# Patient Record
Sex: Male | Born: 1940
Health system: Southern US, Community
[De-identification: ages and names within clinical notes are randomized; demographics above are authoritative.]

## PROBLEM LIST (undated history)

## (undated) DIAGNOSIS — N401 Enlarged prostate with lower urinary tract symptoms: Secondary | ICD-10-CM

## (undated) DIAGNOSIS — I69398 Other sequelae of cerebral infarction: Secondary | ICD-10-CM

## (undated) DIAGNOSIS — I6523 Occlusion and stenosis of bilateral carotid arteries: Secondary | ICD-10-CM

## (undated) DIAGNOSIS — I6930 Unspecified sequelae of cerebral infarction: Secondary | ICD-10-CM

## (undated) DIAGNOSIS — Z973 Presence of spectacles and contact lenses: Secondary | ICD-10-CM

## (undated) DIAGNOSIS — G20A1 Parkinson's disease without dyskinesia, without mention of fluctuations: Secondary | ICD-10-CM

## (undated) DIAGNOSIS — Z87820 Personal history of traumatic brain injury: Secondary | ICD-10-CM

## (undated) DIAGNOSIS — Q211 Atrial septal defect: Secondary | ICD-10-CM

## (undated) DIAGNOSIS — Z95818 Presence of other cardiac implants and grafts: Secondary | ICD-10-CM

## (undated) DIAGNOSIS — N529 Male erectile dysfunction, unspecified: Secondary | ICD-10-CM

## (undated) DIAGNOSIS — E785 Hyperlipidemia, unspecified: Secondary | ICD-10-CM

## (undated) DIAGNOSIS — R7303 Prediabetes: Secondary | ICD-10-CM

## (undated) DIAGNOSIS — G20 Parkinson's disease: Secondary | ICD-10-CM

## (undated) DIAGNOSIS — Z7901 Long term (current) use of anticoagulants: Secondary | ICD-10-CM

## (undated) DIAGNOSIS — Q2112 Patent foramen ovale: Secondary | ICD-10-CM

## (undated) DIAGNOSIS — R269 Unspecified abnormalities of gait and mobility: Secondary | ICD-10-CM

## (undated) DIAGNOSIS — D494 Neoplasm of unspecified behavior of bladder: Secondary | ICD-10-CM

## (undated) DIAGNOSIS — I10 Essential (primary) hypertension: Secondary | ICD-10-CM

## (undated) DIAGNOSIS — H409 Unspecified glaucoma: Secondary | ICD-10-CM

## (undated) DIAGNOSIS — Z8711 Personal history of peptic ulcer disease: Secondary | ICD-10-CM

## (undated) DIAGNOSIS — F482 Pseudobulbar affect: Secondary | ICD-10-CM

## (undated) DIAGNOSIS — R479 Unspecified speech disturbances: Secondary | ICD-10-CM

## (undated) HISTORY — PX: TRANSURETHRAL RESECTION OF PROSTATE: SHX73

## (undated) HISTORY — DX: Essential (primary) hypertension: I10

## (undated) HISTORY — PX: CATARACT EXTRACTION W/ INTRAOCULAR LENS IMPLANT: SHX1309

## (undated) HISTORY — DX: Parkinson's disease: G20

## (undated) HISTORY — PX: OTHER SURGICAL HISTORY: SHX169

## (undated) HISTORY — DX: Parkinson's disease without dyskinesia, without mention of fluctuations: G20.A1

---

## 1976-11-27 HISTORY — PX: APPENDECTOMY: SHX54

## 1978-01-25 HISTORY — PX: REPAIR OF PERFORATED ULCER: SHX6065

## 2015-04-09 HISTORY — PX: TRANSTHORACIC ECHOCARDIOGRAM: SHX275

## 2015-08-30 DIAGNOSIS — I10 Essential (primary) hypertension: Secondary | ICD-10-CM | POA: Diagnosis not present

## 2015-08-30 DIAGNOSIS — E782 Mixed hyperlipidemia: Secondary | ICD-10-CM | POA: Diagnosis not present

## 2015-09-06 DIAGNOSIS — E782 Mixed hyperlipidemia: Secondary | ICD-10-CM | POA: Diagnosis not present

## 2015-09-06 DIAGNOSIS — N401 Enlarged prostate with lower urinary tract symptoms: Secondary | ICD-10-CM | POA: Diagnosis not present

## 2015-09-06 DIAGNOSIS — N183 Chronic kidney disease, stage 3 (moderate): Secondary | ICD-10-CM | POA: Diagnosis not present

## 2015-09-06 DIAGNOSIS — I10 Essential (primary) hypertension: Secondary | ICD-10-CM | POA: Diagnosis not present

## 2015-09-06 DIAGNOSIS — I6930 Unspecified sequelae of cerebral infarction: Secondary | ICD-10-CM | POA: Diagnosis not present

## 2015-09-09 DIAGNOSIS — K635 Polyp of colon: Secondary | ICD-10-CM | POA: Diagnosis not present

## 2015-10-14 DIAGNOSIS — D126 Benign neoplasm of colon, unspecified: Secondary | ICD-10-CM | POA: Diagnosis not present

## 2015-10-20 DIAGNOSIS — Z024 Encounter for examination for driving license: Secondary | ICD-10-CM | POA: Diagnosis not present

## 2015-10-27 DIAGNOSIS — N400 Enlarged prostate without lower urinary tract symptoms: Secondary | ICD-10-CM | POA: Diagnosis not present

## 2015-10-27 DIAGNOSIS — Z86010 Personal history of colonic polyps: Secondary | ICD-10-CM | POA: Diagnosis not present

## 2015-10-27 DIAGNOSIS — Z8673 Personal history of transient ischemic attack (TIA), and cerebral infarction without residual deficits: Secondary | ICD-10-CM | POA: Diagnosis not present

## 2015-10-27 DIAGNOSIS — Z79899 Other long term (current) drug therapy: Secondary | ICD-10-CM | POA: Diagnosis not present

## 2015-10-27 DIAGNOSIS — G20 Parkinson's disease: Secondary | ICD-10-CM | POA: Diagnosis not present

## 2015-10-27 DIAGNOSIS — Z883 Allergy status to other anti-infective agents status: Secondary | ICD-10-CM | POA: Diagnosis not present

## 2015-10-27 DIAGNOSIS — Z7902 Long term (current) use of antithrombotics/antiplatelets: Secondary | ICD-10-CM | POA: Diagnosis not present

## 2015-10-27 DIAGNOSIS — E785 Hyperlipidemia, unspecified: Secondary | ICD-10-CM | POA: Diagnosis not present

## 2015-10-27 DIAGNOSIS — D126 Benign neoplasm of colon, unspecified: Secondary | ICD-10-CM | POA: Diagnosis not present

## 2015-10-27 DIAGNOSIS — I10 Essential (primary) hypertension: Secondary | ICD-10-CM | POA: Diagnosis not present

## 2015-12-28 DIAGNOSIS — G20 Parkinson's disease: Secondary | ICD-10-CM | POA: Diagnosis not present

## 2015-12-28 DIAGNOSIS — I639 Cerebral infarction, unspecified: Secondary | ICD-10-CM | POA: Diagnosis not present

## 2016-01-29 DIAGNOSIS — N390 Urinary tract infection, site not specified: Secondary | ICD-10-CM | POA: Diagnosis not present

## 2016-01-29 DIAGNOSIS — M25571 Pain in right ankle and joints of right foot: Secondary | ICD-10-CM | POA: Diagnosis not present

## 2016-01-29 DIAGNOSIS — Z7902 Long term (current) use of antithrombotics/antiplatelets: Secondary | ICD-10-CM | POA: Diagnosis not present

## 2016-01-29 DIAGNOSIS — Z8673 Personal history of transient ischemic attack (TIA), and cerebral infarction without residual deficits: Secondary | ICD-10-CM | POA: Diagnosis not present

## 2016-01-29 DIAGNOSIS — I10 Essential (primary) hypertension: Secondary | ICD-10-CM | POA: Diagnosis not present

## 2016-01-29 DIAGNOSIS — M25572 Pain in left ankle and joints of left foot: Secondary | ICD-10-CM | POA: Diagnosis not present

## 2016-01-29 DIAGNOSIS — G20 Parkinson's disease: Secondary | ICD-10-CM | POA: Diagnosis not present

## 2016-01-29 DIAGNOSIS — M7989 Other specified soft tissue disorders: Secondary | ICD-10-CM | POA: Diagnosis not present

## 2016-01-29 DIAGNOSIS — X58XXXA Exposure to other specified factors, initial encounter: Secondary | ICD-10-CM | POA: Diagnosis not present

## 2016-01-29 DIAGNOSIS — S93401A Sprain of unspecified ligament of right ankle, initial encounter: Secondary | ICD-10-CM | POA: Diagnosis not present

## 2016-01-29 DIAGNOSIS — R609 Edema, unspecified: Secondary | ICD-10-CM | POA: Diagnosis not present

## 2016-01-29 DIAGNOSIS — R51 Headache: Secondary | ICD-10-CM | POA: Diagnosis not present

## 2016-01-29 DIAGNOSIS — Z79899 Other long term (current) drug therapy: Secondary | ICD-10-CM | POA: Diagnosis not present

## 2016-02-01 DIAGNOSIS — R600 Localized edema: Secondary | ICD-10-CM | POA: Diagnosis not present

## 2016-02-01 DIAGNOSIS — K2960 Other gastritis without bleeding: Secondary | ICD-10-CM | POA: Diagnosis not present

## 2016-02-01 DIAGNOSIS — I10 Essential (primary) hypertension: Secondary | ICD-10-CM | POA: Diagnosis not present

## 2016-02-01 DIAGNOSIS — N183 Chronic kidney disease, stage 3 (moderate): Secondary | ICD-10-CM | POA: Diagnosis not present

## 2016-02-08 DIAGNOSIS — L500 Allergic urticaria: Secondary | ICD-10-CM | POA: Diagnosis not present

## 2016-02-08 DIAGNOSIS — Z719 Counseling, unspecified: Secondary | ICD-10-CM | POA: Diagnosis not present

## 2016-02-08 DIAGNOSIS — Z882 Allergy status to sulfonamides status: Secondary | ICD-10-CM | POA: Diagnosis not present

## 2016-02-11 DIAGNOSIS — Z79899 Other long term (current) drug therapy: Secondary | ICD-10-CM | POA: Diagnosis not present

## 2016-02-11 DIAGNOSIS — Z8673 Personal history of transient ischemic attack (TIA), and cerebral infarction without residual deficits: Secondary | ICD-10-CM | POA: Diagnosis not present

## 2016-02-11 DIAGNOSIS — Z7902 Long term (current) use of antithrombotics/antiplatelets: Secondary | ICD-10-CM | POA: Diagnosis not present

## 2016-02-11 DIAGNOSIS — L509 Urticaria, unspecified: Secondary | ICD-10-CM | POA: Diagnosis not present

## 2016-02-11 DIAGNOSIS — I10 Essential (primary) hypertension: Secondary | ICD-10-CM | POA: Diagnosis not present

## 2016-02-11 DIAGNOSIS — T7840XA Allergy, unspecified, initial encounter: Secondary | ICD-10-CM | POA: Diagnosis not present

## 2016-02-11 DIAGNOSIS — G20 Parkinson's disease: Secondary | ICD-10-CM | POA: Diagnosis not present

## 2016-02-15 DIAGNOSIS — G20 Parkinson's disease: Secondary | ICD-10-CM | POA: Diagnosis not present

## 2016-02-15 DIAGNOSIS — L508 Other urticaria: Secondary | ICD-10-CM | POA: Diagnosis not present

## 2016-02-15 DIAGNOSIS — I10 Essential (primary) hypertension: Secondary | ICD-10-CM | POA: Diagnosis not present

## 2016-02-15 DIAGNOSIS — R600 Localized edema: Secondary | ICD-10-CM | POA: Diagnosis not present

## 2016-03-07 DIAGNOSIS — N183 Chronic kidney disease, stage 3 (moderate): Secondary | ICD-10-CM | POA: Diagnosis not present

## 2016-03-07 DIAGNOSIS — I10 Essential (primary) hypertension: Secondary | ICD-10-CM | POA: Diagnosis not present

## 2016-03-07 DIAGNOSIS — E782 Mixed hyperlipidemia: Secondary | ICD-10-CM | POA: Diagnosis not present

## 2016-03-14 DIAGNOSIS — I10 Essential (primary) hypertension: Secondary | ICD-10-CM | POA: Diagnosis not present

## 2016-03-14 DIAGNOSIS — E782 Mixed hyperlipidemia: Secondary | ICD-10-CM | POA: Diagnosis not present

## 2016-03-14 DIAGNOSIS — R69 Illness, unspecified: Secondary | ICD-10-CM | POA: Diagnosis not present

## 2016-03-14 DIAGNOSIS — N401 Enlarged prostate with lower urinary tract symptoms: Secondary | ICD-10-CM | POA: Diagnosis not present

## 2016-03-14 DIAGNOSIS — R7301 Impaired fasting glucose: Secondary | ICD-10-CM | POA: Diagnosis not present

## 2016-03-14 DIAGNOSIS — I6930 Unspecified sequelae of cerebral infarction: Secondary | ICD-10-CM | POA: Diagnosis not present

## 2016-03-14 DIAGNOSIS — N183 Chronic kidney disease, stage 3 (moderate): Secondary | ICD-10-CM | POA: Diagnosis not present

## 2016-03-30 DIAGNOSIS — M7021 Olecranon bursitis, right elbow: Secondary | ICD-10-CM | POA: Diagnosis not present

## 2016-05-15 DIAGNOSIS — I10 Essential (primary) hypertension: Secondary | ICD-10-CM | POA: Diagnosis not present

## 2016-05-15 DIAGNOSIS — R7301 Impaired fasting glucose: Secondary | ICD-10-CM | POA: Diagnosis not present

## 2016-05-15 DIAGNOSIS — M7021 Olecranon bursitis, right elbow: Secondary | ICD-10-CM | POA: Diagnosis not present

## 2016-05-15 DIAGNOSIS — R600 Localized edema: Secondary | ICD-10-CM | POA: Diagnosis not present

## 2016-05-22 DIAGNOSIS — I10 Essential (primary) hypertension: Secondary | ICD-10-CM | POA: Diagnosis not present

## 2016-05-22 DIAGNOSIS — R7301 Impaired fasting glucose: Secondary | ICD-10-CM | POA: Diagnosis not present

## 2016-05-22 DIAGNOSIS — R600 Localized edema: Secondary | ICD-10-CM | POA: Diagnosis not present

## 2016-05-22 DIAGNOSIS — M7021 Olecranon bursitis, right elbow: Secondary | ICD-10-CM | POA: Diagnosis not present

## 2016-05-22 DIAGNOSIS — S20211A Contusion of right front wall of thorax, initial encounter: Secondary | ICD-10-CM | POA: Diagnosis not present

## 2016-05-23 DIAGNOSIS — R609 Edema, unspecified: Secondary | ICD-10-CM | POA: Diagnosis not present

## 2016-05-23 DIAGNOSIS — I517 Cardiomegaly: Secondary | ICD-10-CM | POA: Diagnosis not present

## 2016-05-23 DIAGNOSIS — I519 Heart disease, unspecified: Secondary | ICD-10-CM | POA: Diagnosis not present

## 2016-05-24 DIAGNOSIS — Z881 Allergy status to other antibiotic agents status: Secondary | ICD-10-CM | POA: Diagnosis not present

## 2016-05-24 DIAGNOSIS — N390 Urinary tract infection, site not specified: Secondary | ICD-10-CM | POA: Diagnosis not present

## 2016-05-24 DIAGNOSIS — T675XXA Heat exhaustion, unspecified, initial encounter: Secondary | ICD-10-CM | POA: Diagnosis not present

## 2016-05-24 DIAGNOSIS — G20 Parkinson's disease: Secondary | ICD-10-CM | POA: Diagnosis not present

## 2016-05-24 DIAGNOSIS — Z880 Allergy status to penicillin: Secondary | ICD-10-CM | POA: Diagnosis not present

## 2016-05-24 DIAGNOSIS — Z79899 Other long term (current) drug therapy: Secondary | ICD-10-CM | POA: Diagnosis not present

## 2016-05-24 DIAGNOSIS — I10 Essential (primary) hypertension: Secondary | ICD-10-CM | POA: Diagnosis not present

## 2016-05-24 DIAGNOSIS — Z8673 Personal history of transient ischemic attack (TIA), and cerebral infarction without residual deficits: Secondary | ICD-10-CM | POA: Diagnosis not present

## 2016-07-03 ENCOUNTER — Ambulatory Visit (INDEPENDENT_AMBULATORY_CARE_PROVIDER_SITE_OTHER): Payer: Medicare HMO | Admitting: Neurology

## 2016-07-03 ENCOUNTER — Encounter: Payer: Self-pay | Admitting: Neurology

## 2016-07-03 VITALS — BP 110/68 | HR 67 | Ht 67.0 in | Wt 154.0 lb

## 2016-07-03 DIAGNOSIS — F482 Pseudobulbar affect: Secondary | ICD-10-CM | POA: Diagnosis not present

## 2016-07-03 DIAGNOSIS — R482 Apraxia: Secondary | ICD-10-CM

## 2016-07-03 DIAGNOSIS — K117 Disturbances of salivary secretion: Secondary | ICD-10-CM | POA: Diagnosis not present

## 2016-07-03 DIAGNOSIS — R69 Illness, unspecified: Secondary | ICD-10-CM | POA: Diagnosis not present

## 2016-07-03 DIAGNOSIS — R471 Dysarthria and anarthria: Secondary | ICD-10-CM

## 2016-07-03 NOTE — Patient Instructions (Signed)
1. We will send a referral for physical, occupational, and speech therapy to [facility]. They will call you directly with an appt.   2. We have you scheduled for your on/off test on 07/27/16 at 2:00 pm. Please do not take your Parkinson's medications on this date.

## 2016-07-03 NOTE — Progress Notes (Signed)
Mike Nixon was seen today in the movement disorders clinic for neurologic consultation at the request of [name], MD.  The consultation is for the evaluation of PD.  No records are available to me from his prior neurologist.   Therefore, all history was obtained from the patient and wife.  He was previously being seen in [place], [place].  He was dx with with PD about 3 years ago and wife states that his first sx was was slow handwriting and slow to eat and slight jaw tremor.  He was started on carbidopa/levodopa 10/100 tid and pt doesn't think that it helped.  It was taking it at 8am/1pm/8-9pm.  Lunchtime is at noon.  He takes the last at bedtime.  However, he admits that he has not taken it for months.  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  When she got to him, he was just laying on the floor.  They called 911.  He was not on ASA at the time.  He was put on plavix and lipitor.  Speech has been biggest issue since the stroke.  States that he never went through speech therapy, even when he was in rehab.  He did physical therapy only.  R leg remains a bit weak after stroke.  Specific Symptoms:  Tremor: Yes.  , only in the jaw Family hx of similar:  No. Voice: no changes until he had his stroke Sleep: sleeps well  Vivid Dreams:  No.  Acting out dreams:  No. Wet Pillows: Yes.   Postural symptoms:  Yes.  , because of feet sticking to the ground/freezing  Falls?  Yes.  , about 1 time every 2 months Bradykinesia symptoms: shuffling gait, slow movements, drooling while awake, difficulty getting out of a chair and difficulty regaining balance Loss of smell:  No. Loss of taste:  No. Urinary Incontinence:  No. Difficulty Swallowing:  No. Handwriting, micrographia: Yes.   Trouble with ADL's:  Yes.   (trouble with shoelaces, shaving)  Trouble buttoning clothing: Yes.   Depression:  Yes.  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 Hallucinations:  No.  visual distortions: No. N/V:  No. Lightheaded:  No.  Syncope: No. Diplopia:  No. Dyskinesia:  No.  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  It is not available for my review today.  PREVIOUS MEDICATIONS: Sinemet  ALLERGIES:   Allergies  Allergen Reactions  . Benazepril Other (See Comments)    Pt not sure of reaction.  . Doxycycline   . Levofloxacin Hives and Swelling    (per patient)  . Simvastatin Other (See Comments)    Pt does not remember what the reaction was.  . Sulfamethoxazole-Trimethoprim Hives and Swelling    (per patient)  . Tamsulosin Hives and Swelling    (per patient)  . Tramadol Hcl Hives and Swelling    (per patient)    CURRENT MEDICATIONS:  Outpatient Encounter Prescriptions as of 07/03/2016  Medication Sig  . atorvastatin (LIPITOR) 80 MG tablet Take 80 mg by mouth daily.  . carbidopa-levodopa (SINEMET IR) 10-100 MG tablet Take 1 tablet by mouth 3 (three) times daily.  . clopidogrel (PLAVIX) 75 MG tablet   . metoprolol succinate (TOPROL-XL) 100 MG 24 hr tablet    No facility-administered encounter medications on file as of 07/03/2016.  PAST MEDICAL HISTORY:   Past Medical History:  Diagnosis Date  . CHF (congestive heart failure) ([place])   . Hypercholesteremia   . Hypertension   . Parkinson's disease ([place])   . Stroke (cerebrum) ([place])     PAST SURGICAL HISTORY:   Past Surgical History:  Procedure Laterality Date  . APPENDECTOMY    . CATARACT EXTRACTION Right   . REPAIR OF PERFORATED ULCER    . TRANSURETHRAL RESECTION OF PROSTATE      SOCIAL HISTORY:   Social History   Social History  . Marital status: Married    Spouse name: N/A  . Number of children: N/A  . Years of education: N/A   Occupational History  . pastor    Social History Main Topics  . Smoking status: Never Smoker  . Smokeless tobacco: Not on file  .  Alcohol use No  . Drug use: No  . Sexual activity: Not on file   Other Topics Concern  . Not on file   Social History Narrative  . No narrative on file    FAMILY HISTORY:   Family Status  Relation Status  . Mother Deceased  . Father Deceased  . Sister Deceased  . Brother Deceased  . Daughter Alive   3, healthy  . Son Alive   2, healthy    ROS:  A complete 10 system review of systems was obtained and was unremarkable apart from what is mentioned above.  PHYSICAL EXAMINATION:    VITALS:   Vitals:   07/03/16 1430  BP: 110/68  Pulse: 67  Weight: 154 lb (69.9 kg)  Height: 5\' 7"  (1.702 m)    GEN:  The patient appears stated age and is in NAD. HEENT:  Normocephalic, atraumatic.  The mucous membranes are moist. The superficial temporal arteries are without ropiness or tenderness. CV:  RRR Lungs:  CTAB Neck/HEME:  There are no carotid bruits bilaterally.  Neurological examination:  Orientation: The patient is alert and oriented x3. Fund of knowledge is appropriate.  Recent and remote memory are intact.  Attention and concentration are normal.    Able to name objects and repeat phrases. Cranial nerves: There is good facial symmetry. There is facial hypomimia.  Pupils are equal round and reactive to light bilaterally. Fundoscopic exam reveals clear margins bilaterally. Extraocular muscles are intact. The visual fields are full to confrontational testing. The speech is dysarthric but fluent.  Soft palate rises symmetrically and there is no tongue deviation. Hearing is intact to conversational tone. Sensation: Sensation is intact to light and pinprick throughout (facial, trunk, extremities). Vibration is intact at the bilateral big toe. There is no extinction with double simultaneous stimulation. There is no sensory dermatomal level identified. Motor: Strength is 5/5 in the bilateral upper and lower extremities.   Shoulder shrug is equal and symmetric.  There is no pronator  drift. Deep tendon reflexes: Deep tendon reflexes are 2+-3-/4 at the bilateral biceps, triceps, brachioradialis, patella and achilles. Plantar responses are downgoing bilaterally.  Movement examination: Tone: There is slight increased tone in the RUE Abnormal movements: There is rare jaw tremor Coordination:  There is decremation with RAM's, seen in the UE with any form of RAMS, including alternating supination and pronation of the forearm, hand opening and closing, finger taps, bilaterally, L more than R Gait and Station: The patient has no difficulty arising out of a deep-seated chair without the use of the hands. The patient's stride length is decreased and he drags the R leg (  states that has done this since the stroke).  He is unstable.  He has difficulty with the turns and nearly falls.   ASSESSMENT/PLAN:  1.  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  He also reports that his shuffling came after the stroke.  He does drag the right leg and somewhat shuffles the left foot, but much of what I see could be accounted for by cerebral infarction.  However, he could also have an aytpical parkinsonian state along with the stroke.  He does have inspiratory stridor which can be seen in atypical states.  Has very little rigidity.  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  I will try to get records from his prior neurologist, Dr. Everlene Balls, in [place], [place], as she saw him prior to the stroke.  I am hoping that this will shed some light on the situation.  I will also try to get a copy of his records from when he had his stroke in [place], [place].  I will try to get a copy of films as well as.  He will remain on Plavix and Lipitor.  -The patient and I talked extensively about the  fact that he is a significant fall risk.  Given that he is on Plavix, I am very worried about this.  I think, at the very least, he needs an ambulatory assistive device.  I talked to him about this.  I talked to him about the risk with Plavix.  He wants to stay on this for now.  He was agreeable to physical, occupational, and speech therapy.  He has very significant dysarthria from the stroke.  Both he and his wife deny any swallowing difficulties.  -I talked to him at length about driving.  I would like him to undergo an occupational therapy driving evaluation.  After I told him what this was, the patient states that he actually underwent a state driving evaluation a few months ago.  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  -I'm going to schedule the patient for a levodopa challenge test to see if levodopa does, in fact, help any of his symptoms.  -We could always consider Botox for the eyelid opening apraxia in the future, if needed. 2.  Sialorrhea, associated with prior cerebral infarction.  -May consider Botox/Myobloc in the future if the patient desires. 3.  Total visit time was 75 minutes.  Greater than 50% of the visit was spent in counseling with the patient and his wife.

## 2016-07-09 DIAGNOSIS — R002 Palpitations: Secondary | ICD-10-CM | POA: Diagnosis not present

## 2016-07-09 DIAGNOSIS — R51 Headache: Secondary | ICD-10-CM | POA: Diagnosis not present

## 2016-07-09 DIAGNOSIS — G20 Parkinson's disease: Secondary | ICD-10-CM | POA: Diagnosis not present

## 2016-07-09 DIAGNOSIS — R079 Chest pain, unspecified: Secondary | ICD-10-CM | POA: Diagnosis not present

## 2016-07-09 DIAGNOSIS — Z9114 Patient's other noncompliance with medication regimen: Secondary | ICD-10-CM | POA: Diagnosis not present

## 2016-07-09 DIAGNOSIS — N390 Urinary tract infection, site not specified: Secondary | ICD-10-CM | POA: Diagnosis not present

## 2016-07-09 DIAGNOSIS — Z881 Allergy status to other antibiotic agents status: Secondary | ICD-10-CM | POA: Diagnosis not present

## 2016-07-09 DIAGNOSIS — R1310 Dysphagia, unspecified: Secondary | ICD-10-CM | POA: Diagnosis not present

## 2016-07-09 DIAGNOSIS — Z882 Allergy status to sulfonamides status: Secondary | ICD-10-CM | POA: Diagnosis not present

## 2016-07-09 DIAGNOSIS — R0602 Shortness of breath: Secondary | ICD-10-CM | POA: Diagnosis not present

## 2016-07-09 DIAGNOSIS — I69351 Hemiplegia and hemiparesis following cerebral infarction affecting right dominant side: Secondary | ICD-10-CM | POA: Diagnosis not present

## 2016-07-09 DIAGNOSIS — R4781 Slurred speech: Secondary | ICD-10-CM | POA: Diagnosis not present

## 2016-07-10 ENCOUNTER — Telehealth: Payer: Self-pay | Admitting: Neurology

## 2016-07-10 DIAGNOSIS — G20 Parkinson's disease: Secondary | ICD-10-CM | POA: Diagnosis not present

## 2016-07-10 DIAGNOSIS — R1310 Dysphagia, unspecified: Secondary | ICD-10-CM | POA: Diagnosis not present

## 2016-07-10 DIAGNOSIS — N390 Urinary tract infection, site not specified: Secondary | ICD-10-CM | POA: Diagnosis not present

## 2016-07-10 NOTE — Telephone Encounter (Signed)
Left message on machine for patient's daughter to call back.   

## 2016-07-10 NOTE — Telephone Encounter (Signed)
Patient daughter Mike Nixon called and needs to speak to someone. Patient is at [facility] and they think it is due to parkinson and may have to move patient to a different hospital and they would like to go to [facility] please call [phone]

## 2016-07-10 NOTE — Telephone Encounter (Signed)
Patient's wife called and said the patient is in [facility] and she would like him moved to [facility].  She said he is in the hospital because of not being able to swallow.  He has an appt. With Dr. Carles Collet on 8/31 (on/off).  I told the wife I would pass on this info to Dr. Carles Collet and we would be back in touch.  I also let the wife know that Dr. Carles Collet doesn't see patients in the hospital, only the office. Discussed with Dr. Carles Collet and she wants the patient to finish up at [facility] and then come see her.  No on/off testing.  She said there may be other diseases that they need to look at.  She would like to see the patient on Tuesday, Aug. 22 at 3:30 if possible for the family to have him here or before.   Jade, please pass this info on.  The wife said to call the daughter.

## 2016-07-10 NOTE — Telephone Encounter (Signed)
Patient made aware. Appt made.  

## 2016-07-11 ENCOUNTER — Telehealth: Payer: Self-pay | Admitting: Neurology

## 2016-07-11 DIAGNOSIS — R1310 Dysphagia, unspecified: Secondary | ICD-10-CM

## 2016-07-11 DIAGNOSIS — G20 Parkinson's disease: Secondary | ICD-10-CM | POA: Diagnosis not present

## 2016-07-11 NOTE — Telephone Encounter (Signed)
Needs EMG per Dr. Carles Collet

## 2016-07-11 NOTE — Telephone Encounter (Signed)
Spoke with patient's daughter and made them aware that Dr. Carles Collet would like patient to have EMG on Thursday if he gets d/c from [facility] prior to then.   They kept patient overnight for physical therapy to assess but believes he will be d/c today.     Hinton Dyer- can you please call Brayton Layman - patient's daughter - to schedule 90 minute EMG for this Thursday?  [phone]

## 2016-07-12 DIAGNOSIS — I509 Heart failure, unspecified: Secondary | ICD-10-CM | POA: Diagnosis not present

## 2016-07-12 DIAGNOSIS — R5381 Other malaise: Secondary | ICD-10-CM | POA: Diagnosis not present

## 2016-07-12 DIAGNOSIS — R0602 Shortness of breath: Secondary | ICD-10-CM | POA: Diagnosis not present

## 2016-07-12 DIAGNOSIS — R2689 Other abnormalities of gait and mobility: Secondary | ICD-10-CM | POA: Diagnosis not present

## 2016-07-12 DIAGNOSIS — G20 Parkinson's disease: Secondary | ICD-10-CM | POA: Diagnosis not present

## 2016-07-12 DIAGNOSIS — I69351 Hemiplegia and hemiparesis following cerebral infarction affecting right dominant side: Secondary | ICD-10-CM | POA: Diagnosis not present

## 2016-07-13 ENCOUNTER — Ambulatory Visit (INDEPENDENT_AMBULATORY_CARE_PROVIDER_SITE_OTHER): Payer: Medicare HMO | Admitting: Neurology

## 2016-07-13 DIAGNOSIS — R1310 Dysphagia, unspecified: Secondary | ICD-10-CM

## 2016-07-13 DIAGNOSIS — G20 Parkinson's disease: Secondary | ICD-10-CM | POA: Diagnosis not present

## 2016-07-13 DIAGNOSIS — I509 Heart failure, unspecified: Secondary | ICD-10-CM | POA: Diagnosis not present

## 2016-07-13 DIAGNOSIS — R531 Weakness: Secondary | ICD-10-CM

## 2016-07-13 DIAGNOSIS — R0602 Shortness of breath: Secondary | ICD-10-CM | POA: Diagnosis not present

## 2016-07-13 DIAGNOSIS — R471 Dysarthria and anarthria: Secondary | ICD-10-CM

## 2016-07-13 DIAGNOSIS — R2689 Other abnormalities of gait and mobility: Secondary | ICD-10-CM | POA: Diagnosis not present

## 2016-07-13 DIAGNOSIS — R5381 Other malaise: Secondary | ICD-10-CM | POA: Diagnosis not present

## 2016-07-13 DIAGNOSIS — I69351 Hemiplegia and hemiparesis following cerebral infarction affecting right dominant side: Secondary | ICD-10-CM | POA: Diagnosis not present

## 2016-07-13 NOTE — Procedures (Signed)
[facility]  [address], [address]  [place], [place] [address] Tel: [phone] Fax:  [phone] Test Date:  07/13/2016  Patient: Mike Nixon DOB: [birth year] Physician: Narda Amber, DO  Sex: Male Height: 5\' 7"  Ref Phys: Alonza Bogus, M.D.  ID#: [id number] Temp: 33.0C Technician: Jerilynn Mages. Dean   Patient Complaints: This is a 76 year old gentleman referred for evaluation of progressive dysphagia, dysarthria, and weakness.  NCV & EMG Findings: Extensive electrodiagnostic testing of the right upper, right lower, midthoracic paraspinal muscles, and bulbar muscles shows: 1. All sensory responses including the right median, ulnar, radial, sural, and superficial peroneal nerves are within normal limits. 2. Right median, ulnar, tibial, and peroneal (TA) motor responses are within normal limits. The right peroneal motor response (EDB) shows reduced amplitude. 3. Bilateral tibial H reflex studies are within normal limits. 4. There is no evidence of active or chronic motor axon loss changes affecting any of the muscles of the upper extremity or thoracic paraspinal muscles. 5. Chronic motor axon loss changes are seen affecting the L5 myotome on the right, without accompanied active denervation. 6. Sparse chronic motor axon loss changes are seen affecting the hyoglossus muscle; in isolation, these findings are of uncertain clinical significance. 7. There is no evidence of active denervation or fasciculation potentials seen in any of the tested muscles.  Impression: 1. 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 2. There is no evidence of a generalized sensorimotor polyneuropathy or diffuse disorder of anterior horn cells.     ___________________________ Narda Amber, DO    Nerve Conduction Studies Anti Sensory Summary Table   Site NR Peak (ms) Norm Peak (ms) P-T Amp (V) Norm P-T Amp  Right Median Anti Sensory (2nd Digit)  33C    Wrist    3.7 <3.8 12.1 >10  Right Radial Anti Sensory (Base 1st Digit)  33C  Wrist    2.3 <2.8 25.2 >10  Right Sup Peroneal Anti Sensory (Ant Lat Mall)  12 cm    3.7 <4.6 5.2 >3  Right Sural Anti Sensory (Lat Mall)  33C  Calf    4.4 <4.6 5.0 >3  Right Ulnar Anti Sensory (5th Digit)  33C  Wrist    3.2 <3.2 7.4 >5   Motor Summary Table   Site NR Onset (ms) Norm Onset (ms) O-P Amp (mV) Norm O-P Amp Site1 Site2 Delta-0 (ms) Dist (cm) Vel (m/s) Norm Vel (m/s)  Right Median Motor (Abd Poll Brev)  33C  Wrist    3.2 <4.0 7.0 >5 Elbow Wrist 4.7 27.0 57 >50  Elbow    7.9  6.8         Right Peroneal Motor (Ext Dig Brev)  Ankle    3.5 <6.0 2.0 >2.5 B Fib Ankle 7.6 33.0 43 >40  B Fib    11.1  1.7  Poplt B Fib 2.2 10.0 45 >40  Poplt    13.3  1.6         Right Peroneal TA Motor (Tib Ant)  33C  Fib Head    2.3 <4.5 3.4 >3 Poplit Fib Head 2.2 10.0 45 >40  Poplit    4.5  3.1         Right Tibial Motor (Abd Hall Brev)  33C  Ankle    4.0 <6.0 7.9 >4 Knee Ankle 8.1 42.0 52 >40  Knee    12.1  5.8         Right Ulnar Motor (Abd Dig Minimi)  33C  Wrist    3.0 <3.1 8.4 >7 B Elbow Wrist 4.3 23.0 53 >50  B Elbow    7.3  7.6  A Elbow B Elbow 1.8 10.0 56 >50  A Elbow    9.1  7.5          H Reflex Studies   NR H-Lat (ms) Lat Norm (ms) L-R H-Lat (ms) M-Lat (ms) HLat-MLat (ms)  Left Tibial (Gastroc)     30.48 <35 0.95 3.81 26.67  Right Tibial (Gastroc)     31.43 <35 0.95 3.81 27.62   EMG   Side Muscle Ins Act Fibs Psw Fasc Number Recrt Dur Dur. Amp Amp. Poly Poly. Comment  Right 1stDorInt Nml Nml Nml Nml Nml Nml Nml Nml Nml Nml Nml Nml N/A  Right Abd Poll Brev Nml Nml Nml Nml Nml Nml Nml Nml Nml Nml Nml Nml N/A  Right PronatorTeres Nml Nml Nml Nml Nml Nml Nml Nml Nml Nml Nml Nml N/A  Right Ext Indicis Nml Nml Nml Nml Nml Nml Nml Nml Nml Nml Nml Nml N/A  Right Biceps Nml Nml Nml Nml Nml Nml Nml Nml Nml Nml Nml Nml N/A  Right Triceps Nml Nml Nml Nml Nml Nml Nml Nml Nml Nml Nml Nml N/A  Right  Deltoid Nml Nml Nml Nml Nml Nml Nml Nml Nml Nml Nml Nml N/A  Right AntTibialis Nml Nml Nml Nml 2- Rapid Most 1+ Most 1+ Many 1+ N/A  Right Gastroc Nml Nml Nml Nml Nml Nml Nml Nml Nml Nml Nml Nml N/A  Right Flex Dig Long Nml Nml Nml Nml 2- Rapid Many 1+ Many 1+ Nml Nml N/A  Right RectFemoris Nml Nml Nml Nml Nml Nml Nml Nml Nml Nml Nml Nml N/A  Right BicepsFemS Nml Nml Nml Nml Nml Nml Nml Nml Nml Nml Nml Nml N/A  Right GluteusMed Nml Nml Nml Nml 1- Rapid Some 1+ Some 1+ Some 1+ N/A  Right T7 Parasp Nml Nml Nml Nml NE - - - - - - - N/A  Right Nasalis Nml Nml Nml Nml Nml Nml Nml Nml Nml Nml Nml Nml N/A  Right Mentalis Nml Nml Nml Nml Nml Nml Nml Nml Nml Nml Nml Nml N/A  Right Hyoglossus Nml Nml Nml Nml 1- Rapid Few 1+ Few 1+ Nml Nml N/A      Waveforms:

## 2016-07-14 ENCOUNTER — Telehealth: Payer: Self-pay | Admitting: Neurology

## 2016-07-14 DIAGNOSIS — R5381 Other malaise: Secondary | ICD-10-CM | POA: Diagnosis not present

## 2016-07-14 DIAGNOSIS — I69351 Hemiplegia and hemiparesis following cerebral infarction affecting right dominant side: Secondary | ICD-10-CM | POA: Diagnosis not present

## 2016-07-14 DIAGNOSIS — R0602 Shortness of breath: Secondary | ICD-10-CM | POA: Diagnosis not present

## 2016-07-14 DIAGNOSIS — G20 Parkinson's disease: Secondary | ICD-10-CM | POA: Diagnosis not present

## 2016-07-14 DIAGNOSIS — I509 Heart failure, unspecified: Secondary | ICD-10-CM | POA: Diagnosis not present

## 2016-07-14 DIAGNOSIS — R2689 Other abnormalities of gait and mobility: Secondary | ICD-10-CM | POA: Diagnosis not present

## 2016-07-14 NOTE — Telephone Encounter (Signed)
Left message on machine for patient to call back.

## 2016-07-14 NOTE — Telephone Encounter (Signed)
-----   Message from Dixon, DO sent at 07/13/2016  5:24 PM EDT ----- Can let pt know that EMG looks good.  I need [facility] records from recent hospitalization.  Do you have those?

## 2016-07-14 NOTE — Telephone Encounter (Signed)
Patient's wife made aware.

## 2016-07-18 ENCOUNTER — Encounter: Payer: Self-pay | Admitting: Neurology

## 2016-07-18 ENCOUNTER — Ambulatory Visit (INDEPENDENT_AMBULATORY_CARE_PROVIDER_SITE_OTHER): Payer: Medicare HMO | Admitting: Neurology

## 2016-07-18 VITALS — BP 130/88 | HR 90 | Ht 67.0 in | Wt 157.0 lb

## 2016-07-18 DIAGNOSIS — R1319 Other dysphagia: Secondary | ICD-10-CM

## 2016-07-18 DIAGNOSIS — I638 Other cerebral infarction: Secondary | ICD-10-CM | POA: Diagnosis not present

## 2016-07-18 DIAGNOSIS — R0602 Shortness of breath: Secondary | ICD-10-CM | POA: Diagnosis not present

## 2016-07-18 DIAGNOSIS — F458 Other somatoform disorders: Secondary | ICD-10-CM | POA: Diagnosis not present

## 2016-07-18 DIAGNOSIS — I6389 Other cerebral infarction: Secondary | ICD-10-CM

## 2016-07-18 DIAGNOSIS — I69351 Hemiplegia and hemiparesis following cerebral infarction affecting right dominant side: Secondary | ICD-10-CM | POA: Diagnosis not present

## 2016-07-18 DIAGNOSIS — R69 Illness, unspecified: Secondary | ICD-10-CM | POA: Diagnosis not present

## 2016-07-18 DIAGNOSIS — R2689 Other abnormalities of gait and mobility: Secondary | ICD-10-CM | POA: Diagnosis not present

## 2016-07-18 DIAGNOSIS — R5381 Other malaise: Secondary | ICD-10-CM | POA: Diagnosis not present

## 2016-07-18 DIAGNOSIS — I509 Heart failure, unspecified: Secondary | ICD-10-CM | POA: Diagnosis not present

## 2016-07-18 DIAGNOSIS — G20 Parkinson's disease: Secondary | ICD-10-CM | POA: Diagnosis not present

## 2016-07-18 DIAGNOSIS — R251 Tremor, unspecified: Secondary | ICD-10-CM

## 2016-07-18 NOTE — Progress Notes (Signed)
Mike Nixon was seen today in the movement disorders clinic for neurologic consultation at the request of Mike Hilding, MD.  The consultation is for the evaluation of PD.  No records are available to me from Mike prior neurologist.   Therefore, all history was obtained from the patient and Nixon.  He was previously being seen in [place], [place].  He was dx with with PD about 3 years ago and Nixon states that Mike first sx was was slow handwriting and slow to eat and slight jaw tremor.  He was started on carbidopa/levodopa 10/100 tid and pt doesn't think that it helped.  It was taking it at 8am/1pm/8-9pm.  Lunchtime is at noon.  He takes the last at bedtime.  However, he admits that he has not taken it for months.  He had a stroke in May 2016 and he woke up and he was dizzy and called Mike Nixon at work.  When she got to him, he was just laying on the floor.  They called 911.  He was not on ASA at the time.  He was put on plavix and lipitor.  Speech has been biggest issue since the stroke.  States that he never went through speech therapy, even when he was in rehab.  He did physical therapy only.  R leg remains a bit weak after stroke.  07/18/16 update:  The patient is seen today in follow-up.  I have reviewed many records from several hospitalizations and also Mike prior neurologist records since our last visit.  He was first seen by Mike prior neurologist in [place] on 06/15/2014 with complaints of speech changes and dragging Mike right leg and trouble buttoning Mike clothing.  On examination, she reports no bradykinesia at that visit, but she reports that he had lip tremor.  He was dragging Mike right leg and Mike rapid alternating movements were normal.  She did an MRI of the brain just following that visit and this was done on 07/06/2014.  There was a small acute right basal ganglia infarct.  He followed up with her just after that in August, 2015 and was started on levodopa, 10/100 3 times per day, as she felt  that Mike infarct was acute, whereas Mike symptoms were for 6 months and he had right leg symptoms and right sided infarct.  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  In September, 2015 the notes said that the patient noted significant improvement on aspirin and a statin.  He was then hospitalized in May, 2016 and she followed up with him after that and said that there was just mild slurring of speech.  On 07/19/2015 she did have him wearing an event monitor for one month that was negative.  It does appear that he had been off of levodopa ever since Mike infarct in May, 2016.  I was able to get Mike hospital records from [facility] from Mike infarction.  He was admitted on 04/08/2015, but they reported left-sided weakness and dizziness.  He had an echocardiogram on 04/09/2015 with an ejection fraction of 60-65%.  An EEG was normal.  An MRA of the neck was normal.  An MRI of the brain stated that "DWI appears to demonstrate multiple punctate areas of high signal involving the cerebellar hemispheres and cerebellar vermis."  I did have the opportunity to review these images once they became available, and DWI was in fact positive in both cerebellar hemispheres and amongst the vermis.  Mike carotid ultrasound was  negative.  There were minimal notes regarding Mike speech but nothing stated that it was particularly slurred.  He was then recently hospitalized once again more on 07/10/2016 and I again reviewed those records.  He was hospitalized for inability to swallow.  Pt states that swallowing issues are with liquids and not solids because he was trying to take too much in the mouth at once.  An MRI without gadolinium was done.  I did not get those images.  There was reported to be "a few old cerebellar infarctions."  There is reported to be an old infarction in the left frontal region.  DWI was negative.  It was concluded that Mike swallowing issues were from Parkinson's disease and Mike refusal to take  levodopa.  He did have a barium swallow that was reported to show mild oropharyngeal dysphagia, but no dietary changes were made, with the exceptions of to take small bites and administer meds one at a time.  He ended up having a very detailed EMG at our office following these hospitalizations to rule out motor neuron disease and there was no evidence of this.  PREVIOUS MEDICATIONS: Sinemet  ALLERGIES:   Allergies  Allergen Reactions  . Benazepril Other (See Comments)    Pt not sure of reaction.  . Doxycycline   . Levofloxacin Hives and Swelling    (per patient)  . Simvastatin Other (See Comments)    Pt does not remember what the reaction was.  . Sulfamethoxazole-Trimethoprim Hives and Swelling    (per patient)  . Tamsulosin Hives and Swelling    (per patient)  . Tramadol Hcl Hives and Swelling    (per patient)    CURRENT MEDICATIONS:  Outpatient Encounter Prescriptions as of 07/18/2016  Medication Sig  . atorvastatin (LIPITOR) 80 MG tablet Take 80 mg by mouth daily.  . clopidogrel (PLAVIX) 75 MG tablet   . metoprolol succinate (TOPROL-XL) 100 MG 24 hr tablet   . [DISCONTINUED] carbidopa-levodopa (SINEMET IR) 10-100 MG tablet Take 1 tablet by mouth 3 (three) times daily.   No facility-administered encounter medications on file as of 07/18/2016.     PAST MEDICAL HISTORY:   Past Medical History:  Diagnosis Date  . CHF (congestive heart failure) ([place])   . Hypercholesteremia   . Hypertension   . Parkinson's disease ([place])   . Stroke (cerebrum) ([place])     PAST SURGICAL HISTORY:   Past Surgical History:  Procedure Laterality Date  . APPENDECTOMY    . CATARACT EXTRACTION Right   . REPAIR OF PERFORATED ULCER    . TRANSURETHRAL RESECTION OF PROSTATE      SOCIAL HISTORY:   Social History   Social History  . Marital status: Married    Spouse name: N/A  . Number of children: N/A  . Years of education: N/A   Occupational History  . pastor    Social History Main Topics    . Smoking status: Never Smoker  . Smokeless tobacco: Never Used  . Alcohol use No  . Drug use: No  . Sexual activity: Not on file   Other Topics Concern  . Not on file   Social History Narrative  . No narrative on file    FAMILY HISTORY:   Family Status  Relation Status  . Mother Deceased  . Father Deceased  . Sister Deceased  . Brother Deceased  . Daughter Alive   3, healthy  . Son Alive   2, healthy    ROS:  A complete 10  system review of systems was obtained and was unremarkable apart from what is mentioned above.  PHYSICAL EXAMINATION:    VITALS:   Vitals:   07/18/16 1531  BP: 130/88  Pulse: 90  Weight: 157 lb (71.2 kg)  Height: 5\' 7"  (1.702 m)    GEN:  The patient appears stated age and is in NAD. HEENT:  Normocephalic, atraumatic.  The mucous membranes are moist. The superficial temporal arteries are without ropiness or tenderness.  CV:  RRR Lungs:  CTAB Neck/HEME:  There are no carotid bruits bilaterally.  Neurological examination:  Orientation: The patient is alert and oriented x3. Fund of knowledge is appropriate.  Recent and remote memory are intact.  Attention and concentration are normal.    Able to name objects and repeat phrases. Cranial nerves: There is good facial symmetry. There is facial hypomimia.  Pupils are equal round and reactive to light bilaterally. Fundoscopic exam reveals clear margins bilaterally. Extraocular muscles are intact. The visual fields are full to confrontational testing. The speech is dysarthric but fluent.  Soft palate rises symmetrically and there is no tongue deviation. Hearing is intact to conversational tone. Sensation: Sensation is intact to light and pinprick throughout (facial, trunk, extremities). Vibration is intact at the bilateral big toe. There is no extinction with double simultaneous stimulation. There is no sensory dermatomal level identified. Motor: Strength is 5/5 in the bilateral upper and lower  extremities.   Shoulder shrug is equal and symmetric.  There is no pronator drift. Deep tendon reflexes: Deep tendon reflexes are 2+-3-/4 at the bilateral biceps, triceps, brachioradialis, patella and achilles. Plantar responses are downgoing bilaterally.  Movement examination: Tone: There is normal tone in the UE bilaterally Abnormal movements: There is rare jaw tremor.  There is spasm of the right face, lower lip. Coordination:  There is decremation with RAM's, seen in the UE with any form of RAMS, including alternating supination and pronation of the forearm, hand opening and closing, finger taps, bilaterally, L more than R Gait and Station: The patient has no difficulty arising out of a deep-seated chair without the use of the hands. The patient's stride length is improved today and isn't dragging the R leg as much.  He is swinging the arms well today  ASSESSMENT/PLAN:  1.  Cerebral infarct  -I had a long and detailed discussion with the patient and Mike Nixon today.  Based on my lengthy review of records (much greater than 60 minutes of non-face-to-face time was spent just in record review), I wonder if he is not having paroxysmal A. fib as Mike history of multiple strokes in diff vascular territories, physical and neuroimaging are very consistent with embolism.  I think he needs a TEE.  I also think he needs a loop recorder implanted.  I am not so sure that he even has parkinsonism, but if so it is very masked by the stroke features and is likely vascular.  I talked to them about doing a DaT scan to see if this is even a component.  We will schedule all of these things, including consultation with Dr. Rayann Heman. 2.  Sialorrhea, associated with prior cerebral infarction.  -May consider Botox/Myobloc in the future if the patient desires. 3.  Total visit time was 40 minutes.  Greater than 50% of the visit was spent in counseling with the patient and Mike Nixon.

## 2016-07-18 NOTE — Patient Instructions (Signed)
1. We are referring you to [facility] for a DaT scan. They will contact you directly to set up a time for this testing. If you do not hear from them they can be contacted at [phone].  2. We have referred you to [facility] for evaluation for a loop recorder. We have you scheduled with Dr. Rayann Heman on 08/09/16 at 2:30 pm to arrive at 2:15 pm. They are located at [address]. [place], [place] [address]. If you need to schedule this appt for any reason please call [phone].

## 2016-07-19 ENCOUNTER — Telehealth: Payer: Self-pay | Admitting: Neurology

## 2016-07-19 DIAGNOSIS — R0602 Shortness of breath: Secondary | ICD-10-CM | POA: Diagnosis not present

## 2016-07-19 DIAGNOSIS — R5381 Other malaise: Secondary | ICD-10-CM | POA: Diagnosis not present

## 2016-07-19 DIAGNOSIS — R2689 Other abnormalities of gait and mobility: Secondary | ICD-10-CM | POA: Diagnosis not present

## 2016-07-19 DIAGNOSIS — G20 Parkinson's disease: Secondary | ICD-10-CM | POA: Diagnosis not present

## 2016-07-19 DIAGNOSIS — I69351 Hemiplegia and hemiparesis following cerebral infarction affecting right dominant side: Secondary | ICD-10-CM | POA: Diagnosis not present

## 2016-07-19 DIAGNOSIS — I509 Heart failure, unspecified: Secondary | ICD-10-CM | POA: Diagnosis not present

## 2016-07-19 NOTE — Telephone Encounter (Signed)
Referral faxed to [facility] for DAT scan at [phone] with confirmation received. They will call patient directly to schedule.

## 2016-07-20 DIAGNOSIS — R2689 Other abnormalities of gait and mobility: Secondary | ICD-10-CM | POA: Diagnosis not present

## 2016-07-20 DIAGNOSIS — R5381 Other malaise: Secondary | ICD-10-CM | POA: Diagnosis not present

## 2016-07-20 DIAGNOSIS — G20 Parkinson's disease: Secondary | ICD-10-CM | POA: Diagnosis not present

## 2016-07-20 DIAGNOSIS — I69351 Hemiplegia and hemiparesis following cerebral infarction affecting right dominant side: Secondary | ICD-10-CM | POA: Diagnosis not present

## 2016-07-20 DIAGNOSIS — I509 Heart failure, unspecified: Secondary | ICD-10-CM | POA: Diagnosis not present

## 2016-07-20 DIAGNOSIS — R0602 Shortness of breath: Secondary | ICD-10-CM | POA: Diagnosis not present

## 2016-07-21 DIAGNOSIS — R0602 Shortness of breath: Secondary | ICD-10-CM | POA: Diagnosis not present

## 2016-07-21 DIAGNOSIS — R2689 Other abnormalities of gait and mobility: Secondary | ICD-10-CM | POA: Diagnosis not present

## 2016-07-21 DIAGNOSIS — I69351 Hemiplegia and hemiparesis following cerebral infarction affecting right dominant side: Secondary | ICD-10-CM | POA: Diagnosis not present

## 2016-07-21 DIAGNOSIS — G20 Parkinson's disease: Secondary | ICD-10-CM | POA: Diagnosis not present

## 2016-07-21 DIAGNOSIS — I509 Heart failure, unspecified: Secondary | ICD-10-CM | POA: Diagnosis not present

## 2016-07-21 DIAGNOSIS — R5381 Other malaise: Secondary | ICD-10-CM | POA: Diagnosis not present

## 2016-07-24 DIAGNOSIS — Z6824 Body mass index (BMI) 24.0-24.9, adult: Secondary | ICD-10-CM | POA: Diagnosis not present

## 2016-07-24 DIAGNOSIS — I6930 Unspecified sequelae of cerebral infarction: Secondary | ICD-10-CM | POA: Diagnosis not present

## 2016-07-24 DIAGNOSIS — I69891 Dysphagia following other cerebrovascular disease: Secondary | ICD-10-CM | POA: Diagnosis not present

## 2016-07-24 DIAGNOSIS — G20 Parkinson's disease: Secondary | ICD-10-CM | POA: Diagnosis not present

## 2016-07-24 DIAGNOSIS — I10 Essential (primary) hypertension: Secondary | ICD-10-CM | POA: Diagnosis not present

## 2016-07-24 DIAGNOSIS — N401 Enlarged prostate with lower urinary tract symptoms: Secondary | ICD-10-CM | POA: Diagnosis not present

## 2016-07-25 ENCOUNTER — Encounter: Payer: Self-pay | Admitting: Internal Medicine

## 2016-07-25 DIAGNOSIS — I69351 Hemiplegia and hemiparesis following cerebral infarction affecting right dominant side: Secondary | ICD-10-CM | POA: Diagnosis not present

## 2016-07-25 DIAGNOSIS — R5381 Other malaise: Secondary | ICD-10-CM | POA: Diagnosis not present

## 2016-07-25 DIAGNOSIS — I509 Heart failure, unspecified: Secondary | ICD-10-CM | POA: Diagnosis not present

## 2016-07-25 DIAGNOSIS — R0602 Shortness of breath: Secondary | ICD-10-CM | POA: Diagnosis not present

## 2016-07-25 DIAGNOSIS — G20 Parkinson's disease: Secondary | ICD-10-CM | POA: Diagnosis not present

## 2016-07-25 DIAGNOSIS — R2689 Other abnormalities of gait and mobility: Secondary | ICD-10-CM | POA: Diagnosis not present

## 2016-07-27 ENCOUNTER — Ambulatory Visit: Payer: Medicare HMO | Admitting: Neurology

## 2016-07-27 DIAGNOSIS — R5381 Other malaise: Secondary | ICD-10-CM | POA: Diagnosis not present

## 2016-07-27 DIAGNOSIS — I69351 Hemiplegia and hemiparesis following cerebral infarction affecting right dominant side: Secondary | ICD-10-CM | POA: Diagnosis not present

## 2016-07-27 DIAGNOSIS — G20 Parkinson's disease: Secondary | ICD-10-CM | POA: Diagnosis not present

## 2016-07-27 DIAGNOSIS — I509 Heart failure, unspecified: Secondary | ICD-10-CM | POA: Diagnosis not present

## 2016-07-27 DIAGNOSIS — R0602 Shortness of breath: Secondary | ICD-10-CM | POA: Diagnosis not present

## 2016-07-27 DIAGNOSIS — R2689 Other abnormalities of gait and mobility: Secondary | ICD-10-CM | POA: Diagnosis not present

## 2016-07-28 DIAGNOSIS — R0602 Shortness of breath: Secondary | ICD-10-CM | POA: Diagnosis not present

## 2016-07-28 DIAGNOSIS — R5381 Other malaise: Secondary | ICD-10-CM | POA: Diagnosis not present

## 2016-07-28 DIAGNOSIS — R2689 Other abnormalities of gait and mobility: Secondary | ICD-10-CM | POA: Diagnosis not present

## 2016-07-28 DIAGNOSIS — I509 Heart failure, unspecified: Secondary | ICD-10-CM | POA: Diagnosis not present

## 2016-07-28 DIAGNOSIS — I69351 Hemiplegia and hemiparesis following cerebral infarction affecting right dominant side: Secondary | ICD-10-CM | POA: Diagnosis not present

## 2016-07-28 DIAGNOSIS — G20 Parkinson's disease: Secondary | ICD-10-CM | POA: Diagnosis not present

## 2016-08-01 DIAGNOSIS — R2689 Other abnormalities of gait and mobility: Secondary | ICD-10-CM | POA: Diagnosis not present

## 2016-08-01 DIAGNOSIS — I69351 Hemiplegia and hemiparesis following cerebral infarction affecting right dominant side: Secondary | ICD-10-CM | POA: Diagnosis not present

## 2016-08-01 DIAGNOSIS — R0602 Shortness of breath: Secondary | ICD-10-CM | POA: Diagnosis not present

## 2016-08-01 DIAGNOSIS — I509 Heart failure, unspecified: Secondary | ICD-10-CM | POA: Diagnosis not present

## 2016-08-01 DIAGNOSIS — R5381 Other malaise: Secondary | ICD-10-CM | POA: Diagnosis not present

## 2016-08-01 DIAGNOSIS — G20 Parkinson's disease: Secondary | ICD-10-CM | POA: Diagnosis not present

## 2016-08-03 DIAGNOSIS — I509 Heart failure, unspecified: Secondary | ICD-10-CM | POA: Diagnosis not present

## 2016-08-03 DIAGNOSIS — I69351 Hemiplegia and hemiparesis following cerebral infarction affecting right dominant side: Secondary | ICD-10-CM | POA: Diagnosis not present

## 2016-08-03 DIAGNOSIS — G20 Parkinson's disease: Secondary | ICD-10-CM | POA: Diagnosis not present

## 2016-08-03 DIAGNOSIS — R0602 Shortness of breath: Secondary | ICD-10-CM | POA: Diagnosis not present

## 2016-08-03 DIAGNOSIS — R2689 Other abnormalities of gait and mobility: Secondary | ICD-10-CM | POA: Diagnosis not present

## 2016-08-03 DIAGNOSIS — R5381 Other malaise: Secondary | ICD-10-CM | POA: Diagnosis not present

## 2016-08-04 DIAGNOSIS — R0602 Shortness of breath: Secondary | ICD-10-CM | POA: Diagnosis not present

## 2016-08-04 DIAGNOSIS — I69351 Hemiplegia and hemiparesis following cerebral infarction affecting right dominant side: Secondary | ICD-10-CM | POA: Diagnosis not present

## 2016-08-04 DIAGNOSIS — R5381 Other malaise: Secondary | ICD-10-CM | POA: Diagnosis not present

## 2016-08-04 DIAGNOSIS — G20 Parkinson's disease: Secondary | ICD-10-CM | POA: Diagnosis not present

## 2016-08-04 DIAGNOSIS — R2689 Other abnormalities of gait and mobility: Secondary | ICD-10-CM | POA: Diagnosis not present

## 2016-08-04 DIAGNOSIS — I509 Heart failure, unspecified: Secondary | ICD-10-CM | POA: Diagnosis not present

## 2016-08-07 DIAGNOSIS — I509 Heart failure, unspecified: Secondary | ICD-10-CM | POA: Diagnosis not present

## 2016-08-07 DIAGNOSIS — R2689 Other abnormalities of gait and mobility: Secondary | ICD-10-CM | POA: Diagnosis not present

## 2016-08-07 DIAGNOSIS — R0602 Shortness of breath: Secondary | ICD-10-CM | POA: Diagnosis not present

## 2016-08-07 DIAGNOSIS — G20 Parkinson's disease: Secondary | ICD-10-CM | POA: Diagnosis not present

## 2016-08-07 DIAGNOSIS — I69351 Hemiplegia and hemiparesis following cerebral infarction affecting right dominant side: Secondary | ICD-10-CM | POA: Diagnosis not present

## 2016-08-07 DIAGNOSIS — R5381 Other malaise: Secondary | ICD-10-CM | POA: Diagnosis not present

## 2016-08-08 DIAGNOSIS — I509 Heart failure, unspecified: Secondary | ICD-10-CM | POA: Diagnosis not present

## 2016-08-08 DIAGNOSIS — R0602 Shortness of breath: Secondary | ICD-10-CM | POA: Diagnosis not present

## 2016-08-08 DIAGNOSIS — G20 Parkinson's disease: Secondary | ICD-10-CM | POA: Diagnosis not present

## 2016-08-08 DIAGNOSIS — I69351 Hemiplegia and hemiparesis following cerebral infarction affecting right dominant side: Secondary | ICD-10-CM | POA: Diagnosis not present

## 2016-08-08 DIAGNOSIS — R5381 Other malaise: Secondary | ICD-10-CM | POA: Diagnosis not present

## 2016-08-08 DIAGNOSIS — R2689 Other abnormalities of gait and mobility: Secondary | ICD-10-CM | POA: Diagnosis not present

## 2016-08-09 ENCOUNTER — Encounter: Payer: Self-pay | Admitting: Internal Medicine

## 2016-08-09 ENCOUNTER — Ambulatory Visit (INDEPENDENT_AMBULATORY_CARE_PROVIDER_SITE_OTHER): Payer: Medicare HMO | Admitting: Internal Medicine

## 2016-08-09 VITALS — BP 124/70 | HR 62 | Ht 67.0 in | Wt 155.2 lb

## 2016-08-09 DIAGNOSIS — I639 Cerebral infarction, unspecified: Secondary | ICD-10-CM

## 2016-08-09 NOTE — Progress Notes (Signed)
ELECTROPHYSIOLOGY CONSULT NOTE  Patient ID: Mike Nixon MRN: [id number], DOB/AGE: 1941-08-23   Admit date: (Not on file) Date of Consult: 08/09/2016  Primary Physician: [name], MD Consulting physician:  Dr Alfonso Patten Tat  Reason for Consultation: Cryptogenic stroke; recommendations regarding Implantable Loop Recorder  History of Present Illness Mike Nixon has had prior recurrent strokes (Dr Doristine Devoid notes reviewed). he has been monitored on telemetry which has demonstrated no arrhythmias. He has also worn 30 day monitor.  He does have palpitations lasting seconds but has never had arrhythmias captured. EP has been asked to evaluate for placement of an implantable loop recorder to monitor for atrial fibrillation.  Past Medical History Past Medical History:  Diagnosis Date  . CHF (congestive heart failure) ([place])   . Hypercholesteremia   . Hypertension   . Parkinson's disease ([place])   . Stroke (cerebrum) [name])     Past Surgical History Past Surgical History:  Procedure Laterality Date  . APPENDECTOMY    . CATARACT EXTRACTION Right   . REPAIR OF PERFORATED ULCER    . TRANSURETHRAL RESECTION OF PROSTATE      Allergies/Intolerances Allergies  Allergen Reactions  . Benazepril Other (See Comments)    Pt not sure of reaction.  . Doxycycline     Unknown  . Levofloxacin Hives and Swelling    (per patient)  . Simvastatin Other (See Comments)    Pt does not remember what the reaction was.  . Sulfamethoxazole-Trimethoprim Hives and Swelling    (per patient)  . Tamsulosin Hives and Swelling    (per patient)  . Tramadol Hcl Hives and Swelling    (per patient)   Inpatient Medications   Social History Denies tobacco/ ETOH/ drugs Lives in [place] with spouse.  Previous Medical illustrator.  Review of Systems General: No chills, fever, night sweats or weight changes  Cardiovascular:  No chest pain, dyspnea on exertion, edema, orthopnea, palpitations, paroxysmal nocturnal  dyspnea Dermatological: No rash, lesions or masses Respiratory: No cough, dyspnea Urologic: No hematuria, dysuria Abdominal: No nausea, vomiting, diarrhea, bright red blood per rectum, melena, or hematemesis Neurologic: No visual changes, weakness, changes in mental status All other systems reviewed and are otherwise negative except as noted above.  Physical Exam Blood pressure 124/70, pulse 62, height 5\' 7"  (1.702 m), weight 155 lb 3.2 oz (70.4 kg).  General: Well developed, well appearing 75 y.o. male in no acute distress. HEENT: Normocephalic, atraumatic. EOMs intact. Sclera nonicteric. Oropharynx clear.  Neck: Supple without bruits. No JVD. Lungs: Respirations regular and unlabored, CTA bilaterally. No wheezes, rales or rhonchi. Heart: RRR. S1, S2 present. No murmurs, rub, S3 or S4. Abdomen: Soft, non-tender, non-distended. BS present x 4 quadrants. No hepatosplenomegaly.  Extremities: No clubbing, cyanosis or edema. DP/PT/Radials 2+ and equal bilaterally. Psych: flat affect. Neuro: psychomotor slowing noted, verbal slowing also noted Musculoskeletal: No kyphosis. Skin: Intact. Warm and dry. No rashes or petechiae in exposed areas.   Labs No results found for: WBC, HGB, HCT, MCV, PLT   Echocardiogram  From [facility] noted  12-lead ECG sinus rhythm, nonspecific ST/T changse Telemetry from [facility] reviewed showing sinus rhythm   Assessment and Plan 1. Cryptogenic stroke I agree with Dr Tat that given recurrent strokes of unknown source that TEE and possible ILR are appropriate.  Risks, benefits, and alterantives to TEE were discussed with the patient. If the TEE is negative, we recommend loop recorder insertion to monitor for AF at the same hospital visit. The indication for  loop recorder insertion / monitoring for AF in setting of cryptogenic stroke was discussed with the patient. The loop recorder insertion procedure was reviewed in detail including risks and benefits.  These risks include but are not limited to bleeding and infection. The patient expressed verbal understanding and agrees to proceed. The patient was also counseled regarding wound care and device follow-up.  Army Fossa 08/09/2016, 2:44 PM

## 2016-08-09 NOTE — Patient Instructions (Addendum)
Medication Instructions:  Your physician recommends that you continue on your current medications as directed. Please refer to the Current Medication list given to you today.   Labwork: None Ordered    Testing/Procedures: Your physician has requested that you have a TEE. During a TEE, sound waves are used to create images of your heart. It provides your doctor with information about the size and shape of your heart and how well your heart's chambers and valves are working. In this test, a transducer is attached to the end of a flexible tube that's guided down your throat and into your esophagus (the tube leading from you mouth to your stomach) to get a more detailed image of your heart. You are not awake for the procedure. Please see the instruction sheet given to you today. For further information please visit [email or web address] 08/18/16   Your physician has requested that you have a LINQ placed.-----08/18/16   Any Other Special Instructions Will Be Listed Below (If Applicable).   Report to the [place] at 9:30 AM  Nothing to eat or drink after midnight prior to procedure  May take medication with a sip of water day of procedure  Will need someone to drive you home.      Follow-Up:  Your physician recommends that you schedule a follow-up appointment in: 7-10 days in the [facility]    If you need a refill on your cardiac medications before your next appointment, please call your pharmacy.

## 2016-08-10 DIAGNOSIS — G20 Parkinson's disease: Secondary | ICD-10-CM | POA: Diagnosis not present

## 2016-08-10 DIAGNOSIS — I6930 Unspecified sequelae of cerebral infarction: Secondary | ICD-10-CM | POA: Diagnosis not present

## 2016-08-10 DIAGNOSIS — Z6824 Body mass index (BMI) 24.0-24.9, adult: Secondary | ICD-10-CM | POA: Diagnosis not present

## 2016-08-10 DIAGNOSIS — N401 Enlarged prostate with lower urinary tract symptoms: Secondary | ICD-10-CM | POA: Diagnosis not present

## 2016-08-10 DIAGNOSIS — R251 Tremor, unspecified: Secondary | ICD-10-CM | POA: Diagnosis not present

## 2016-08-10 DIAGNOSIS — I69891 Dysphagia following other cerebrovascular disease: Secondary | ICD-10-CM | POA: Diagnosis not present

## 2016-08-10 DIAGNOSIS — I10 Essential (primary) hypertension: Secondary | ICD-10-CM | POA: Diagnosis not present

## 2016-08-14 DIAGNOSIS — R5381 Other malaise: Secondary | ICD-10-CM | POA: Diagnosis not present

## 2016-08-14 DIAGNOSIS — G20 Parkinson's disease: Secondary | ICD-10-CM | POA: Diagnosis not present

## 2016-08-14 DIAGNOSIS — I509 Heart failure, unspecified: Secondary | ICD-10-CM | POA: Diagnosis not present

## 2016-08-14 DIAGNOSIS — I69351 Hemiplegia and hemiparesis following cerebral infarction affecting right dominant side: Secondary | ICD-10-CM | POA: Diagnosis not present

## 2016-08-14 DIAGNOSIS — R2689 Other abnormalities of gait and mobility: Secondary | ICD-10-CM | POA: Diagnosis not present

## 2016-08-14 DIAGNOSIS — R0602 Shortness of breath: Secondary | ICD-10-CM | POA: Diagnosis not present

## 2016-08-16 DIAGNOSIS — R0602 Shortness of breath: Secondary | ICD-10-CM | POA: Diagnosis not present

## 2016-08-16 DIAGNOSIS — I509 Heart failure, unspecified: Secondary | ICD-10-CM | POA: Diagnosis not present

## 2016-08-16 DIAGNOSIS — G20 Parkinson's disease: Secondary | ICD-10-CM | POA: Diagnosis not present

## 2016-08-16 DIAGNOSIS — R5381 Other malaise: Secondary | ICD-10-CM | POA: Diagnosis not present

## 2016-08-16 DIAGNOSIS — I69351 Hemiplegia and hemiparesis following cerebral infarction affecting right dominant side: Secondary | ICD-10-CM | POA: Diagnosis not present

## 2016-08-16 DIAGNOSIS — R2689 Other abnormalities of gait and mobility: Secondary | ICD-10-CM | POA: Diagnosis not present

## 2016-08-18 ENCOUNTER — Ambulatory Visit (HOSPITAL_BASED_OUTPATIENT_CLINIC_OR_DEPARTMENT_OTHER): Payer: Medicare HMO

## 2016-08-18 ENCOUNTER — Encounter (HOSPITAL_COMMUNITY): Payer: Self-pay | Admitting: Emergency Medicine

## 2016-08-18 ENCOUNTER — Emergency Department (HOSPITAL_COMMUNITY): Payer: Medicare HMO

## 2016-08-18 ENCOUNTER — Encounter (HOSPITAL_COMMUNITY): Payer: Self-pay | Admitting: *Deleted

## 2016-08-18 ENCOUNTER — Encounter (HOSPITAL_COMMUNITY)
Admission: RE | Disposition: A | Discharge status: Home / Self Care | Payer: Self-pay | Source: Ambulatory Visit | Attending: Internal Medicine

## 2016-08-18 ENCOUNTER — Ambulatory Visit (HOSPITAL_COMMUNITY)
Admission: RE | Admit: 2016-08-18 | Discharge: 2016-08-18 | Disposition: A | Discharge status: Home / Self Care | Payer: Medicare HMO | Source: Ambulatory Visit | Attending: Internal Medicine | Admitting: Internal Medicine

## 2016-08-18 ENCOUNTER — Telehealth: Payer: Self-pay | Admitting: Physician Assistant

## 2016-08-18 ENCOUNTER — Emergency Department (HOSPITAL_COMMUNITY)
Admission: EM | Admit: 2016-08-18 | Discharge: 2016-08-18 | Disposition: A | Discharge status: Home / Self Care | Payer: Medicare HMO | Source: Home / Self Care | Attending: Emergency Medicine | Admitting: Emergency Medicine

## 2016-08-18 DIAGNOSIS — G8918 Other acute postprocedural pain: Secondary | ICD-10-CM | POA: Insufficient documentation

## 2016-08-18 DIAGNOSIS — I509 Heart failure, unspecified: Secondary | ICD-10-CM | POA: Insufficient documentation

## 2016-08-18 DIAGNOSIS — Z8673 Personal history of transient ischemic attack (TIA), and cerebral infarction without residual deficits: Secondary | ICD-10-CM | POA: Insufficient documentation

## 2016-08-18 DIAGNOSIS — I639 Cerebral infarction, unspecified: Secondary | ICD-10-CM | POA: Diagnosis not present

## 2016-08-18 DIAGNOSIS — I10 Essential (primary) hypertension: Secondary | ICD-10-CM | POA: Diagnosis not present

## 2016-08-18 DIAGNOSIS — G20 Parkinson's disease: Secondary | ICD-10-CM | POA: Insufficient documentation

## 2016-08-18 DIAGNOSIS — I110 Hypertensive heart disease with heart failure: Secondary | ICD-10-CM

## 2016-08-18 DIAGNOSIS — R079 Chest pain, unspecified: Secondary | ICD-10-CM | POA: Insufficient documentation

## 2016-08-18 DIAGNOSIS — E7800 Pure hypercholesterolemia, unspecified: Secondary | ICD-10-CM | POA: Insufficient documentation

## 2016-08-18 DIAGNOSIS — R002 Palpitations: Secondary | ICD-10-CM | POA: Insufficient documentation

## 2016-08-18 DIAGNOSIS — Z95818 Presence of other cardiac implants and grafts: Secondary | ICD-10-CM

## 2016-08-18 HISTORY — PX: TEE WITHOUT CARDIOVERSION: SHX5443

## 2016-08-18 HISTORY — DX: Presence of other cardiac implants and grafts: Z95.818

## 2016-08-18 HISTORY — PX: EP IMPLANTABLE DEVICE: SHX172B

## 2016-08-18 LAB — BASIC METABOLIC PANEL
Anion gap: 5 (ref 5–15)
BUN: 13 mg/dL (ref 6–20)
CO2: 25 mmol/L (ref 22–32)
Calcium: 9.5 mg/dL (ref 8.9–10.3)
Chloride: 108 mmol/L (ref 101–111)
Creatinine, Ser: 1.11 mg/dL (ref 0.61–1.24)
GFR calc Af Amer: >60 mL/min (ref >60)
GFR calc non Af Amer: >60 mL/min (ref >60)
Glucose, Bld: 141 mg/dL — ABNORMAL HIGH (ref 65–99)
Potassium: 3.7 mmol/L (ref 3.5–5.1)
Sodium: 138 mmol/L (ref 135–145)

## 2016-08-18 LAB — I-STAT TROPONIN, ED: Troponin i, poc: 0.01 ng/mL (ref 0.00–0.08)

## 2016-08-18 LAB — CBC
HCT: 44.1 % (ref 39.0–52.0)
Hemoglobin: 14.1 g/dL (ref 13.0–17.0)
MCH: 25.7 pg — ABNORMAL LOW (ref 26.0–34.0)
MCHC: 32 g/dL (ref 30.0–36.0)
MCV: 80.3 fL (ref 78.0–100.0)
Platelets: 202 10*3/uL (ref 150–400)
RBC: 5.49 MIL/uL (ref 4.22–5.81)
RDW: 13.4 % (ref 11.5–15.5)
WBC: 7 10*3/uL (ref 4.0–10.5)

## 2016-08-18 SURGERY — LOOP RECORDER INSERTION
Anesthesia: LOCAL

## 2016-08-18 SURGERY — ECHOCARDIOGRAM, TRANSESOPHAGEAL
Anesthesia: Moderate Sedation

## 2016-08-18 MED ORDER — LIDOCAINE-EPINEPHRINE 1 %-1:100000 IJ SOLN
INTRAMUSCULAR | Status: DC | PRN
  Administered 2016-08-18: 20 mL

## 2016-08-18 MED ORDER — SODIUM CHLORIDE 0.9 % IV SOLN
INTRAVENOUS | Status: DC
  Administered 2016-08-18: 10:00:00 via INTRAVENOUS

## 2016-08-18 MED ORDER — MIDAZOLAM HCL 5 MG/ML IJ SOLN
INTRAMUSCULAR | Status: AC
  Filled 2016-08-18: qty 2

## 2016-08-18 MED ORDER — FENTANYL CITRATE (PF) 100 MCG/2ML IJ SOLN
INTRAMUSCULAR | Status: AC
  Filled 2016-08-18: qty 2

## 2016-08-18 MED ORDER — FENTANYL CITRATE (PF) 100 MCG/2ML IJ SOLN
INTRAMUSCULAR | Status: DC | PRN
  Administered 2016-08-18: 25 ug via INTRAVENOUS

## 2016-08-18 MED ORDER — MIDAZOLAM HCL 10 MG/2ML IJ SOLN
INTRAMUSCULAR | Status: DC | PRN
  Administered 2016-08-18 (×3): 1 mg via INTRAVENOUS

## 2016-08-18 MED ORDER — ACETAMINOPHEN 500 MG PO TABS
500.0000 mg | ORAL_TABLET | Freq: Once | ORAL | Status: AC
  Administered 2016-08-18: 500 mg via ORAL
  Filled 2016-08-18: qty 1

## 2016-08-18 MED ORDER — BUTAMBEN-TETRACAINE-BENZOCAINE 2-2-14 % EX AERO
INHALATION_SPRAY | CUTANEOUS | Status: DC | PRN
  Administered 2016-08-18: 2 via TOPICAL

## 2016-08-18 SURGICAL SUPPLY — 2 items
LOOP REVEAL LINQSYS (Prosthesis & Implant Heart) ×2 IMPLANT
PACK LOOP INSERTION (CUSTOM PROCEDURE TRAY) ×2 IMPLANT

## 2016-08-18 NOTE — CV Procedure (Signed)
    Transesophageal Echocardiogram Note  Mike Nixon [id number] 04-Sep-1941  Procedure: Transesophageal Echocardiogram Indications: cryptogenic stroke  Operators: Nelva Bush, MD and Fransico Him, MD  Procedure Details Consent: Obtained Time Out: Verified patient identification, verified procedure, site/side was marked, verified correct patient position, special equipment/implants available, Radiology Safety Procedures followed,  medications/allergies/relevent history reviewed, required imaging and test results available.  Performed  Medications:  During this procedure the patient is administered a total of Versed 3 mg and Fentanyl 25 mcg  to achieve and maintain moderate conscious sedation.  The patient's heart rate, blood pressure, and oxygen saturation are monitored continuously during the procedure. The period of conscious sedation is 17 minutes, of which I was present face-to-face 100% of this time.  Left Ventrical:  Normal size and contractile function.  Mitral Valve: Normal without significant regurgitation.  Aortic Valve: Trileaflet with mild thickening and trivial regurgitation  Tricuspid Valve: Normal thickness with mild regurgitation.  Pulmonic Valve: Suboptimally visualized.  No significant regurgitation.  Left Atrium/ Left atrial appendage: Normal.  No evidence of thrombus.  Atrial septum: Mobile septum with positive bubble study consistent with PFO.  Aorta: No significant atherosclerosis.   Complications: No apparent complications Patient did tolerate procedure well.  Dr. Radford Pax was present for the entire procedure as proctor.  Nelva Bush, MD 08/18/2016, 11:17 AM

## 2016-08-18 NOTE — Progress Notes (Signed)
*  PRELIMINARY RESULTS* Vascular Ultrasound Lower extremity venous duplex has been completed.  Preliminary findings: No evidence of DVT or baker's cyst.  Landry Mellow, RDMS, RVT  08/18/2016, 11:52 AM

## 2016-08-18 NOTE — Interval H&P Note (Signed)
History and Physical Interval Note:  08/18/2016 1:25 PM  Mike Nixon  has presented today for surgery, with the diagnosis of Cryptogenic Stroke  The various methods of treatment have been discussed with the patient and family. After consideration of risks, benefits and other options for treatment, the patient has consented to  Procedure(s): Loop Recorder Insertion (N/A) as a surgical intervention .  The patient's history has been reviewed, patient examined, no change in status, stable for surgery.  I have reviewed the patient's chart and labs.  Questions were answered to the patient's satisfaction.     Thompson Grayer

## 2016-08-18 NOTE — Interval H&P Note (Signed)
History and Physical Interval Note:  08/18/2016 10:24 AM  Mike Nixon  has presented today for surgery, with the diagnosis of CRYPTOGENIC STROKE  The various methods of treatment have been discussed with the patient and family. After consideration of risks, benefits and other options for treatment, the patient has consented to  Procedure(s): TRANSESOPHAGEAL ECHOCARDIOGRAM (TEE) (N/A) as a surgical intervention .  The patient's history has been reviewed, patient examined, no change in status, stable for surgery.  I have reviewed the patient's chart and labs.  Questions were answered to the patient's satisfaction.     Fransico Him

## 2016-08-18 NOTE — Discharge Instructions (Signed)

## 2016-08-18 NOTE — Progress Notes (Signed)
Visit to ED noted in epic.  Routine implantable loop recorder insertion today by me.  My suspicion for complication is low.  Typically tylenol is sufficient for pain. Would not recommend removal of steristrips or any manipulation of the device insert site.  No aggressive workup related to the loop recorder should be required.  Supportive measures should be reasonable. Please contact me for any concerns with this patient tonight.  [phone]  Thanks!  Thompson Grayer MD, [facility] 08/18/2016 9:59 PM

## 2016-08-18 NOTE — Progress Notes (Signed)
  Echocardiogram Echocardiogram Transesophageal has been performed.  Darlina Sicilian M 08/18/2016, 11:37 AM

## 2016-08-18 NOTE — Telephone Encounter (Signed)
    Patient had TEE/Loop recorder placement today. Wife calls reporting that patient is having 10/10 throbbing pain over implant site. I explained that this was not a common complaint after loop recorder placement. No redness, bleeding or streaking. Wife very worried and adamant about going to ER.    Angelena Form PA-C  [facility]

## 2016-08-18 NOTE — Interval H&P Note (Signed)
History and Physical Interval Note:  08/18/2016 10:45 AM  Mike Nixon  has presented today for transeshophageal echocardiogram, with the diagnosis of CRYPTOGENIC STROKE  The various methods of treatment have been discussed with the patient and family. After consideration of risks, benefits and other options for treatment, the patient has consented to  Procedure(s): TRANSESOPHAGEAL ECHOCARDIOGRAM (TEE) (N/A) as a surgical intervention .  The patient's history has been reviewed, patient examined, no change in status, stable for surgery.  I have reviewed the patient's chart and labs.  Questions were answered to the patient's satisfaction.     Havana Baldwin

## 2016-08-18 NOTE — ED Triage Notes (Signed)
Patient arrives post TEE and loop recorder placement this AM. States that today around 1700 he began to experience intense chest pain. Left chest appears mildly swollen compared to right. Area is sore. Denies other new symptoms.

## 2016-08-18 NOTE — Discharge Instructions (Signed)
You were seen for post operative pain. You had no acute issues. You may take tylenol for pain and call your cardiologist if you have further questions. If you have worsening pain, return to the ED for evaluation

## 2016-08-18 NOTE — H&P (View-Only) (Signed)
ELECTROPHYSIOLOGY CONSULT NOTE  Patient ID: Mike Nixon MRN: [id number], DOB/AGE: 74-Nov-1942   Admit date: (Not on file) Date of Consult: 08/09/2016  Primary Physician: [name], MD Consulting physician:  Dr Alfonso Patten Tat  Reason for Consultation: Cryptogenic stroke; recommendations regarding Implantable Loop Recorder  History of Present Illness Mike Nixon has had prior recurrent strokes (Dr Doristine Devoid notes reviewed). he has been monitored on telemetry which has demonstrated no arrhythmias. He has also worn 30 day monitor.  He does have palpitations lasting seconds but has never had arrhythmias captured. EP has been asked to evaluate for placement of an implantable loop recorder to monitor for atrial fibrillation.  Past Medical History Past Medical History:  Diagnosis Date  . CHF (congestive heart failure) ([place])   . Hypercholesteremia   . Hypertension   . Parkinson's disease ([place])   . Stroke (cerebrum) [facility])     Past Surgical History Past Surgical History:  Procedure Laterality Date  . APPENDECTOMY    . CATARACT EXTRACTION Right   . REPAIR OF PERFORATED ULCER    . TRANSURETHRAL RESECTION OF PROSTATE      Allergies/Intolerances Allergies  Allergen Reactions  . Benazepril Other (See Comments)    Pt not sure of reaction.  . Doxycycline     Unknown  . Levofloxacin Hives and Swelling    (per patient)  . Simvastatin Other (See Comments)    Pt does not remember what the reaction was.  . Sulfamethoxazole-Trimethoprim Hives and Swelling    (per patient)  . Tamsulosin Hives and Swelling    (per patient)  . Tramadol Hcl Hives and Swelling    (per patient)   Inpatient Medications   Social History Denies tobacco/ ETOH/ drugs Lives in [place] with spouse.  Previous Medical illustrator.  Review of Systems General: No chills, fever, night sweats or weight changes  Cardiovascular:  No chest pain, dyspnea on exertion, edema, orthopnea, palpitations, paroxysmal nocturnal  dyspnea Dermatological: No rash, lesions or masses Respiratory: No cough, dyspnea Urologic: No hematuria, dysuria Abdominal: No nausea, vomiting, diarrhea, bright red blood per rectum, melena, or hematemesis Neurologic: No visual changes, weakness, changes in mental status All other systems reviewed and are otherwise negative except as noted above.  Physical Exam Blood pressure 124/70, pulse 62, height 5\' 7"  (1.702 m), weight 155 lb 3.2 oz (70.4 kg).  General: Well developed, well appearing 75 y.o. male in no acute distress. HEENT: Normocephalic, atraumatic. EOMs intact. Sclera nonicteric. Oropharynx clear.  Neck: Supple without bruits. No JVD. Lungs: Respirations regular and unlabored, CTA bilaterally. No wheezes, rales or rhonchi. Heart: RRR. S1, S2 present. No murmurs, rub, S3 or S4. Abdomen: Soft, non-tender, non-distended. BS present x 4 quadrants. No hepatosplenomegaly.  Extremities: No clubbing, cyanosis or edema. DP/PT/Radials 2+ and equal bilaterally. Psych: flat affect. Neuro: psychomotor slowing noted, verbal slowing also noted Musculoskeletal: No kyphosis. Skin: Intact. Warm and dry. No rashes or petechiae in exposed areas.   Labs No results found for: WBC, HGB, HCT, MCV, PLT   Echocardiogram  From [facility] noted  12-lead ECG sinus rhythm, nonspecific ST/T [place] Telemetry from [facility] reviewed showing sinus rhythm   Assessment and Plan 1. Cryptogenic stroke I agree with Dr Tat that given recurrent strokes of unknown source that TEE and possible ILR are appropriate.  Risks, benefits, and alterantives to TEE were discussed with the patient. If the TEE is negative, we recommend loop recorder insertion to monitor for AF at the same hospital visit. The indication for  loop recorder insertion / monitoring for AF in setting of cryptogenic stroke was discussed with the patient. The loop recorder insertion procedure was reviewed in detail including risks and benefits.  These risks include but are not limited to bleeding and infection. The patient expressed verbal understanding and agrees to proceed. The patient was also counseled regarding wound care and device follow-up.  Army Fossa 08/09/2016, 2:44 PM

## 2016-08-18 NOTE — ED Notes (Signed)
Pt stable, ambulatory, states understanding of discharge instructions 

## 2016-08-18 NOTE — ED Provider Notes (Signed)
[facility] DEPT Provider Note   CSN: [id number] Arrival date & time: 08/18/16  2018     History   Chief Complaint Chief Complaint  Patient presents with  . Chest Pain  . Post-op Problem    HPI   Mike Nixon is a 75 y.o. Male with a past medical history of stoke, hypertension presents after having a transesophageal echocardiograma and implanted loop recorder placed today. He reports pain over the implantation site of the loop recorder. He has not tried any thing to treat the pain yet. He denies swelling over the area, bleeding or drainage. He denies radiation of the pain down his arm to to his jaw, he denies shortness of breath. Otherwise he denies fever, recent illnesses.   Past Medical History:  Diagnosis Date  . CHF (congestive heart failure) ([place])   . Hypercholesteremia   . Hypertension   . Parkinson's disease ([place])   . Stroke (cerebrum) ([place])     There are no active problems to display for this patient.   Past Surgical History:  Procedure Laterality Date  . APPENDECTOMY    . CATARACT EXTRACTION Right   . EP IMPLANTABLE DEVICE N/A 08/18/2016   Procedure: Loop Recorder Insertion;  Surgeon: Thompson Grayer, MD;  Location: [place] CV LAB;  Service: Cardiovascular;  Laterality: N/A;  . REPAIR OF PERFORATED ULCER    . TRANSURETHRAL RESECTION OF PROSTATE         Home Medications    Prior to Admission medications   Medication Sig Start Date End Date Taking? Authorizing Provider  atorvastatin (LIPITOR) 80 MG tablet Take 80 mg by mouth at bedtime.    Yes Historical Provider, MD  clopidogrel (PLAVIX) 75 MG tablet Take 75 mg by mouth daily.  05/22/16  Yes Historical Provider, MD  meclizine (ANTIVERT) 25 MG tablet Take 25 mg by mouth 3 (three) times daily as needed for dizziness.   Yes Historical Provider, MD  metoprolol succinate (TOPROL-XL) 100 MG 24 hr tablet Take 100 mg by mouth daily.  06/22/16  Yes Historical Provider, MD    Family History Family History    Problem Relation Age of Onset  . Stroke Sister     Social History Social History  Substance Use Topics  . Smoking status: Never Smoker  . Smokeless tobacco: Never Used  . Alcohol use No     Allergies   Benazepril; Doxycycline; Levofloxacin; Simvastatin; Sulfamethoxazole-trimethoprim; Tamsulosin; and Tramadol hcl   Review of Systems Review of Systems  Constitutional: Negative.   HENT: Negative.   Eyes: Negative.   Respiratory: Negative.   Cardiovascular: Negative.   Gastrointestinal: Negative.   Genitourinary: Negative.   Musculoskeletal:       Pain over loop recorder implantation site  Neurological: Negative.      Physical Exam Updated Vital Signs BP 139/95   Pulse (!) 53   Temp 98.1 F (36.7 C) (Oral)   Resp 18   SpO2 99%   Physical Exam  Constitutional: He is oriented to person, place, and time. He appears well-developed and well-nourished.  HENT:  Head: Normocephalic and atraumatic.  Eyes: EOM are normal. Pupils are equal, round, and reactive to light.  Neck: Normal range of motion.  Cardiovascular: Normal rate and regular rhythm.   No murmur heard. Pulmonary/Chest: Effort normal and breath sounds normal. No respiratory distress.  Abdominal: Soft. Bowel sounds are normal. He exhibits no distension. There is no tenderness.  Musculoskeletal: Normal range of motion.  Neurological: He is alert and oriented to person,  place, and time.  Skin: Skin is warm and dry. Capillary refill takes less than 2 seconds.  Psychiatric: He has a normal mood and affect.     ED Treatments / Results  Labs (all labs ordered are listed, but only abnormal results are displayed) Labs Reviewed  BASIC METABOLIC PANEL - Abnormal; Notable for the following:       Result Value   Glucose, Bld 141 (*)    All other components within normal limits  CBC - Abnormal; Notable for the following:    MCH 25.7 (*)    All other components within normal limits  I-STAT TROPOININ, ED     EKG  EKG Interpretation None       Radiology Dg Chest 2 View  Result Date: 08/18/2016 CLINICAL DATA:  Chest pain, status post loop recorder placement EXAM: CHEST  2 VIEW COMPARISON:  05/22/2016 FINDINGS: Lungs are essentially clear. No focal consolidation. No pleural effusion or pneumothorax. Heart is normal in size. Loop recorder overlying the left hemithorax. Surgical clips at the GE junction. Visualized osseous structures are within normal limits. IMPRESSION: Loop recorder overlying the left hemithorax. No evidence of acute cardiopulmonary disease. Electronically Signed   By: Julian Hy M.D.   On: 08/18/2016 21:21    Procedures Procedures (including critical care time)  Medications Ordered in ED Medications  acetaminophen (TYLENOL) tablet 500 mg (not administered)     Initial Impression / Assessment and Plan / ED Course  I have reviewed the triage vital signs and the nursing notes.  Pertinent labs & imaging results that were available during my care of the patient were reviewed by me and considered in my medical decision making (see chart for details).  Clinical Course    Uncomplicated ED course  Final Clinical Impressions(s) / ED Diagnoses   Final diagnoses:  Postoperative pain   75 y/o male s/p implantable lood recorder today who presents for pain over the insertion site of the loop recorder. He has no evidence of cardiogenic pain with negative troponin and EKG negative for ischemia. He remained hemodynamically stable in the ED. His cardiologist Dr Lennart Pall was spoken to regarding this case and he was in agreement to manage pain with tylenol and discharge to follow wit cardiology as needed. This was explained to the patient and he was discharged to home in stable condition. ED return precautions reviewed    New Prescriptions New Prescriptions   No medications on file     Veatrice Bourbon, MD 08/18/16 [place], MD 08/18/16 [id number]     Elnora Morrison, MD 08/19/16 [id number]

## 2016-08-19 ENCOUNTER — Encounter (HOSPITAL_COMMUNITY): Payer: Self-pay | Admitting: Cardiology

## 2016-08-22 DIAGNOSIS — R2689 Other abnormalities of gait and mobility: Secondary | ICD-10-CM | POA: Diagnosis not present

## 2016-08-22 DIAGNOSIS — I69351 Hemiplegia and hemiparesis following cerebral infarction affecting right dominant side: Secondary | ICD-10-CM | POA: Diagnosis not present

## 2016-08-22 DIAGNOSIS — I509 Heart failure, unspecified: Secondary | ICD-10-CM | POA: Diagnosis not present

## 2016-08-22 DIAGNOSIS — G20 Parkinson's disease: Secondary | ICD-10-CM | POA: Diagnosis not present

## 2016-08-22 DIAGNOSIS — R0602 Shortness of breath: Secondary | ICD-10-CM | POA: Diagnosis not present

## 2016-08-22 DIAGNOSIS — R5381 Other malaise: Secondary | ICD-10-CM | POA: Diagnosis not present

## 2016-08-24 DIAGNOSIS — R0602 Shortness of breath: Secondary | ICD-10-CM | POA: Diagnosis not present

## 2016-08-24 DIAGNOSIS — R5381 Other malaise: Secondary | ICD-10-CM | POA: Diagnosis not present

## 2016-08-24 DIAGNOSIS — R2689 Other abnormalities of gait and mobility: Secondary | ICD-10-CM | POA: Diagnosis not present

## 2016-08-24 DIAGNOSIS — I509 Heart failure, unspecified: Secondary | ICD-10-CM | POA: Diagnosis not present

## 2016-08-24 DIAGNOSIS — I69351 Hemiplegia and hemiparesis following cerebral infarction affecting right dominant side: Secondary | ICD-10-CM | POA: Diagnosis not present

## 2016-08-24 DIAGNOSIS — G20 Parkinson's disease: Secondary | ICD-10-CM | POA: Diagnosis not present

## 2016-08-27 ENCOUNTER — Telehealth: Payer: Self-pay | Admitting: Neurology

## 2016-08-27 NOTE — Telephone Encounter (Signed)
Not urgent but needs follow up with me to go over all that he has had done since our last visit.  Next 4-5 weeks is fine.

## 2016-08-28 ENCOUNTER — Encounter: Payer: Self-pay | Admitting: Internal Medicine

## 2016-08-28 ENCOUNTER — Ambulatory Visit (INDEPENDENT_AMBULATORY_CARE_PROVIDER_SITE_OTHER): Payer: Medicare HMO | Admitting: *Deleted

## 2016-08-28 DIAGNOSIS — I639 Cerebral infarction, unspecified: Secondary | ICD-10-CM

## 2016-08-28 LAB — CUP PACEART INCLINIC DEVICE CHECK: Date Time Interrogation Session: 20171002121812

## 2016-08-28 NOTE — Telephone Encounter (Signed)
Left message on machine for patient to call back.

## 2016-08-28 NOTE — Telephone Encounter (Signed)
Appt made with patient's wife.  

## 2016-08-28 NOTE — Progress Notes (Signed)
Wound Loop check in clinic. Wound well-healed, edges approximated no redness or drainage. Battery status: Good. R-waves 0.26mV. 0 symptom episodes, 0 tachy episodes, Brady and pause off. 0 AF episodes. Monthly summary reports and ROV with JA PRN. Pt and family educated about wound care and home monitoring.

## 2016-09-06 DIAGNOSIS — R1312 Dysphagia, oropharyngeal phase: Secondary | ICD-10-CM | POA: Diagnosis not present

## 2016-09-06 DIAGNOSIS — R471 Dysarthria and anarthria: Secondary | ICD-10-CM | POA: Diagnosis not present

## 2016-09-06 DIAGNOSIS — I638 Other cerebral infarction: Secondary | ICD-10-CM | POA: Diagnosis not present

## 2016-09-06 NOTE — Progress Notes (Signed)
Mike Nixon was seen today in the movement disorders clinic for neurologic consultation at the request of [name], MD.  The consultation is for the evaluation of PD.  No records are available to me from his prior neurologist.   Therefore, all history was obtained from the patient and wife.  He was previously being seen in [place], [place].  He was dx with with PD about 3 years ago and wife states that his first sx was was slow handwriting and slow to eat and slight jaw tremor.  He was started on carbidopa/levodopa 10/100 tid and pt doesn't think that it helped.  It was taking it at 8am/1pm/8-9pm.  Lunchtime is at noon.  He takes the last at bedtime.  However, he admits that he has not taken it for months.  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  When she got to him, he was just laying on the floor.  They called 911.  He was not on ASA at the time.  He was put on plavix and lipitor.  Speech has been biggest issue since the stroke.  States that he never went through speech therapy, even when he was in rehab.  He did physical therapy only.  R leg remains a bit weak after stroke.  07/18/16 update:  The patient is seen today in follow-up.  I have reviewed many records from several hospitalizations and also his prior neurologist records since our last visit.  He was first seen by his prior neurologist in [place] on 06/15/2014 with complaints of speech changes and dragging his right leg and trouble buttoning his clothing.  On examination, she reports no bradykinesia at that visit, but she reports that he had lip tremor.  He was dragging his right leg and his rapid alternating movements were normal.  She did an MRI of the brain just following that visit and this was done on 07/06/2014.  There was a small acute right basal ganglia infarct.  He followed up with her just after that in August, 2015 and was started on levodopa, 10/100 3 times per day, as she felt  that his infarct was acute, whereas his symptoms were for 6 months and he had right leg symptoms and right sided infarct.  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  In September, 2015 the notes said that the patient noted significant improvement on aspirin and a statin.  He was then hospitalized in May, 2016 and she followed up with him after that and said that there was just mild slurring of speech.  On 07/19/2015 she did have him wearing an event monitor for one month that was negative.  It does appear that he had been off of levodopa ever since his infarct in May, 2016.  I was able to get his hospital records from [facility] from his infarction.  He was admitted on 04/08/2015, but they reported left-sided weakness and dizziness.  He had an echocardiogram on 04/09/2015 with an ejection fraction of 60-65%.  An EEG was normal.  An MRA of the neck was normal.  An MRI of the brain stated that "DWI appears to demonstrate multiple punctate areas of high signal involving the cerebellar hemispheres and cerebellar vermis."  I did have the opportunity to review these images once they became available, and DWI was in fact positive in both cerebellar hemispheres and amongst the vermis.  His carotid ultrasound was  negative.  There were minimal notes regarding his speech but nothing stated that it was particularly slurred.  He was then recently hospitalized once again more on 07/10/2016 and I again reviewed those records.  He was hospitalized for inability to swallow.  Pt states that swallowing issues are with liquids and not solids because he was trying to take too much in the mouth at once.  An MRI without gadolinium was done.  I did not get those images.  There was reported to be "a few old cerebellar infarctions."  There is reported to be an old infarction in the left frontal region.  DWI was negative.  It was concluded that his swallowing issues were from Parkinson's disease and his refusal to take  levodopa.  He did have a barium swallow that was reported to show mild oropharyngeal dysphagia, but no dietary changes were made, with the exceptions of to take small bites and administer meds one at a time.  He ended up having a very detailed EMG at our office following these hospitalizations to rule out motor neuron disease and there was no evidence of this.  09/08/16 update:  Pt returns accompanied by his wife who supplements the history.  Since our last visit the patient had a DaT scan in 07/2016 and it was abnormal demonstrating decreased uptake, being virtually absent in the bilateral caudate and the putamen.  Wife does state that when he was last hospitalized, he had been given levodopa and it helped but was d/c in the hospital.   He also saw Dr. Rayann Heman and had a TEE on 08/18/16 and that was normal with the exception of evidence of a PFO.   He had a LE doppler on 08/18/16 and that was normal.   He had a loop recorder implanted on 08/18/16 and as of 08/28/16 there were no episodes of atrial fibrillation.   PREVIOUS MEDICATIONS: Sinemet  ALLERGIES:   Allergies  Allergen Reactions  . Benazepril Other (See Comments)    Pt not sure of reaction.  . Doxycycline     Unknown  . Levofloxacin Hives and Swelling  . Simvastatin Other (See Comments)    Pt does not remember what the reaction was.  . Sulfamethoxazole-Trimethoprim Hives and Swelling  . Tamsulosin Hives and Swelling  . Tramadol Hcl Hives and Swelling    CURRENT MEDICATIONS:  Outpatient Encounter Prescriptions as of 09/08/2016  Medication Sig  . atorvastatin (LIPITOR) 80 MG tablet Take 80 mg by mouth at bedtime.   . clopidogrel (PLAVIX) 75 MG tablet Take 75 mg by mouth daily.   . meclizine (ANTIVERT) 25 MG tablet Take 25 mg by mouth 3 (three) times daily as needed for dizziness.  . metoprolol succinate (TOPROL-XL) 100 MG 24 hr tablet Take 100 mg by mouth daily.    No facility-administered encounter medications on file as of 09/08/2016.       PAST MEDICAL HISTORY:   Past Medical History:  Diagnosis Date  . CHF (congestive heart failure) ([place])   . Hypercholesteremia   . Hypertension   . Parkinson's disease ([place])   . Stroke (cerebrum) ([place])     PAST SURGICAL HISTORY:   Past Surgical History:  Procedure Laterality Date  . APPENDECTOMY    . CATARACT EXTRACTION Right   . EP IMPLANTABLE DEVICE N/A 08/18/2016   Procedure: Loop Recorder Insertion;  Surgeon: Thompson Grayer, MD;  Location: [facility] CV LAB;  Service: Cardiovascular;  Laterality: N/A;  . REPAIR OF PERFORATED ULCER    .  TEE WITHOUT CARDIOVERSION N/A 08/18/2016   Procedure: TRANSESOPHAGEAL ECHOCARDIOGRAM (TEE);  Surgeon: Sueanne Margarita, MD;  Location[facility] ENDOSCOPY;  Service: Cardiovascular;  Laterality: N/A;  . TRANSURETHRAL RESECTION OF PROSTATE      SOCIAL HISTORY:   Social History   Social History  . Marital status: Married    Spouse name: N/A  . Number of children: N/A  . Years of education: N/A   Occupational History  . pastor    Social History Main Topics  . Smoking status: Never Smoker  . Smokeless tobacco: Never Used  . Alcohol use No  . Drug use: No  . Sexual activity: Not on file   Other Topics Concern  . Not on file   Social History Narrative  . No narrative on file    FAMILY HISTORY:   Family Status  Relation Status  . Mother Deceased  . Father Deceased  . Sister Deceased  . Brother Deceased  . Daughter Alive   3, healthy  . Son Alive   2, healthy    ROS:  A complete 10 system review of systems was obtained and was unremarkable apart from what is mentioned above.  PHYSICAL EXAMINATION:    VITALS:   Vitals:   09/08/16 1333  BP: 136/72  Pulse: 66  Weight: 157 lb (71.2 kg)  Height: 5\' 11"  (1.803 m)    GEN:  The patient appears stated age and is in NAD.  Pt has laughter (likely PBA). HEENT:  Normocephalic, atraumatic.  The mucous membranes are moist. The superficial temporal arteries are without ropiness or  tenderness.  CV:  RRR Lungs:  CTAB Neck/HEME:  There are no carotid bruits bilaterally.  Neurological examination:  Orientation: The patient is alert and oriented x3. Cranial nerves: There is good facial symmetry. There is facial hypomimia.  Pupils are equal round and reactive to light bilaterally. Fundoscopic exam reveals clear margins bilaterally. Extraocular muscles are intact. The visual fields are full to confrontational testing. The speech is dysarthric but fluent.  Soft palate rises symmetrically and there is no tongue deviation. Hearing is intact to conversational tone. Sensation: Sensation is intact to light and pinprick throughout (facial, trunk, extremities). Vibration is intact at the bilateral big toe. There is no extinction with double simultaneous stimulation. There is no sensory dermatomal level identified. Motor: Strength is 5/5 in the bilateral upper and lower extremities.   Shoulder shrug is equal and symmetric.  There is no pronator drift.   Movement examination: Tone: There is mild increased tone in the RUE Abnormal movements: There is rare jaw tremor.  There is spasm of the right face, lower lip. Coordination:  There is decremation with RAM's, seen in the UE with any form of RAMS, including alternating supination and pronation of the forearm, hand opening and closing, finger taps, bilaterally, L more than R Gait and Station: The patient has no difficulty arising out of a deep-seated chair without the use of the hands. The patient has start hesitation today.  Drags the R leg just a little.  He is swinging the arms well today  ASSESSMENT/PLAN:  1.  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  Had negative doppler U/S of legs.  -has loop recorder  -on lipitor and Plavix 2.  Parkinsonism  -many of these  sx's are "hidden" by after  effects of infarct.  However, DaT scan in 07/2016 demonstrated marked decrease of radiotracer in bilateral caudate/putamen.  Will try and restart levodopa.  If no help, may do levodopa challenge. 3.  Sialorrhea, associated with prior cerebral infarction.  -May consider Botox/Myobloc in the future if the patient desires. 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 5.  Total visit time was 30 minutes.  Greater than 50% of the visit was spent in counseling with the patient and his wife.

## 2016-09-08 ENCOUNTER — Encounter: Payer: Self-pay | Admitting: Neurology

## 2016-09-08 ENCOUNTER — Ambulatory Visit (INDEPENDENT_AMBULATORY_CARE_PROVIDER_SITE_OTHER): Payer: Medicare HMO | Admitting: Neurology

## 2016-09-08 VITALS — BP 136/72 | HR 66 | Ht 71.0 in | Wt 157.0 lb

## 2016-09-08 DIAGNOSIS — F482 Pseudobulbar affect: Secondary | ICD-10-CM | POA: Diagnosis not present

## 2016-09-08 DIAGNOSIS — I638 Other cerebral infarction: Secondary | ICD-10-CM

## 2016-09-08 DIAGNOSIS — G20 Parkinson's disease: Secondary | ICD-10-CM

## 2016-09-08 DIAGNOSIS — I6389 Other cerebral infarction: Secondary | ICD-10-CM

## 2016-09-08 MED ORDER — CARBIDOPA-LEVODOPA 25-100 MG PO TABS: 1.0000 | ORAL_TABLET | Freq: Three times a day (TID) | ORAL | 2 refills | Status: DC

## 2016-09-08 NOTE — Patient Instructions (Signed)
Start Carbidopa Levodopa as follows:  Take 1/2 tablet three times daily, at least 30 minutes before meals, for one week  Then take 1/2 tablet in the morning, 1/2 tablet in the afternoon, 1 tablet in the evening, at least 30 minutes before meals, for one week  Then take 1/2 tablet in the morning, 1 tablet in the afternoon, 1 tablet in the evening, at least 30 minutes before meals, for one week  Then take 1 tablet three times daily, at least 30 minutes before meals  

## 2016-09-11 ENCOUNTER — Telehealth: Payer: Self-pay | Admitting: Neurology

## 2016-09-11 NOTE — Telephone Encounter (Signed)
I'm not sure what medication they are talking about- do you?

## 2016-09-11 NOTE — Telephone Encounter (Signed)
Patients daughter made aware

## 2016-09-11 NOTE — Telephone Encounter (Signed)
Mike Nixon 11/19/1941. His daughter called about his medication Carbidopa levodopa. She said he was seen by Dr. Carles Collet last week. She said there was a medication she was going to give him for his stomach due to the Carbidopa Levodopa. When they picked his medication up it was not with it. He is in [place] now with his Daughter. She would like to see if it can be called in to the [facility]  [facility], [place]  # [phone]. His daughter Oluwadarasimi Kucera # is [phone]. Thank you

## 2016-09-11 NOTE — Telephone Encounter (Signed)
I think that they misunderstood.  I was telling them that it has a medication (carbidopa) in there already so that the other medication (levodopa) doesn't break down in the stomach.

## 2016-09-18 ENCOUNTER — Ambulatory Visit (INDEPENDENT_AMBULATORY_CARE_PROVIDER_SITE_OTHER): Payer: Medicare HMO | Admitting: *Deleted

## 2016-09-18 DIAGNOSIS — I639 Cerebral infarction, unspecified: Secondary | ICD-10-CM | POA: Diagnosis not present

## 2016-09-18 NOTE — Progress Notes (Signed)
Carelink Summary Report / Loop Recorder 

## 2016-09-22 ENCOUNTER — Ambulatory Visit: Payer: Medicare HMO | Admitting: Neurology

## 2016-09-27 DIAGNOSIS — R7301 Impaired fasting glucose: Secondary | ICD-10-CM | POA: Diagnosis not present

## 2016-09-27 DIAGNOSIS — N183 Chronic kidney disease, stage 3 (moderate): Secondary | ICD-10-CM | POA: Diagnosis not present

## 2016-09-27 DIAGNOSIS — I10 Essential (primary) hypertension: Secondary | ICD-10-CM | POA: Diagnosis not present

## 2016-09-27 DIAGNOSIS — G20 Parkinson's disease: Secondary | ICD-10-CM | POA: Diagnosis not present

## 2016-09-27 DIAGNOSIS — E782 Mixed hyperlipidemia: Secondary | ICD-10-CM | POA: Diagnosis not present

## 2016-10-02 DIAGNOSIS — R7301 Impaired fasting glucose: Secondary | ICD-10-CM | POA: Diagnosis not present

## 2016-10-02 DIAGNOSIS — Z23 Encounter for immunization: Secondary | ICD-10-CM | POA: Diagnosis not present

## 2016-10-02 DIAGNOSIS — G20 Parkinson's disease: Secondary | ICD-10-CM | POA: Diagnosis not present

## 2016-10-02 DIAGNOSIS — E782 Mixed hyperlipidemia: Secondary | ICD-10-CM | POA: Diagnosis not present

## 2016-10-02 DIAGNOSIS — N401 Enlarged prostate with lower urinary tract symptoms: Secondary | ICD-10-CM | POA: Diagnosis not present

## 2016-10-02 DIAGNOSIS — I6930 Unspecified sequelae of cerebral infarction: Secondary | ICD-10-CM | POA: Diagnosis not present

## 2016-10-02 DIAGNOSIS — I10 Essential (primary) hypertension: Secondary | ICD-10-CM | POA: Diagnosis not present

## 2016-10-02 DIAGNOSIS — N183 Chronic kidney disease, stage 3 (moderate): Secondary | ICD-10-CM | POA: Diagnosis not present

## 2016-10-14 LAB — CUP PACEART REMOTE DEVICE CHECK
Date Time Interrogation Session: 20171022174126
Implantable Pulse Generator Implant Date: 20170922

## 2016-10-14 NOTE — Progress Notes (Signed)
Carelink summary report received. Battery status OK. Normal device function. No new symptom episodes, tachy episodes, brady, or pause episodes. No new AF episodes. Monthly summary reports and ROV/PRN 

## 2016-10-17 ENCOUNTER — Ambulatory Visit (INDEPENDENT_AMBULATORY_CARE_PROVIDER_SITE_OTHER): Payer: Medicare HMO | Admitting: *Deleted

## 2016-10-17 DIAGNOSIS — I639 Cerebral infarction, unspecified: Secondary | ICD-10-CM | POA: Diagnosis not present

## 2016-10-17 NOTE — Progress Notes (Signed)
Mike Nixon was seen today in the movement disorders clinic for neurologic consultation at the request of [name], MD.  The consultation is for the evaluation of PD.  No records are available to me from his prior neurologist.   Therefore, all history was obtained from the patient and wife.  He was previously being seen in [place], [place].  He was dx with with PD about 3 years ago and wife states that his first sx was was slow handwriting and slow to eat and slight jaw tremor.  He was started on carbidopa/levodopa 10/100 tid and pt doesn't think that it helped.  It was taking it at 8am/1pm/8-9pm.  Lunchtime is at noon.  He takes the last at bedtime.  However, he admits that he has not taken it for months.  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  When she got to him, he was just laying on the floor.  They called 911.  He was not on ASA at the time.  He was put on plavix and lipitor.  Speech has been biggest issue since the stroke.  States that he never went through speech therapy, even when he was in rehab.  He did physical therapy only.  R leg remains a bit weak after stroke.  07/18/16 update:  The patient is seen today in follow-up.  I have reviewed many records from several hospitalizations and also his prior neurologist records since our last visit.  He was first seen by his prior neurologist in [place] on 06/15/2014 with complaints of speech changes and dragging his right leg and trouble buttoning his clothing.  On examination, she reports no bradykinesia at that visit, but she reports that he had lip tremor.  He was dragging his right leg and his rapid alternating movements were normal.  She did an MRI of the brain just following that visit and this was done on 07/06/2014.  There was a small acute right basal ganglia infarct.  He followed up with her just after that in August, 2015 and was started on levodopa, 10/100 3 times per day, as she felt  that his infarct was acute, whereas his symptoms were for 6 months and he had right leg symptoms and right sided infarct.  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  In September, 2015 the notes said that the patient noted significant improvement on aspirin and a statin.  He was then hospitalized in May, 2016 and she followed up with him after that and said that there was just mild slurring of speech.  On 07/19/2015 she did have him wearing an event monitor for one month that was negative.  It does appear that he had been off of levodopa ever since his infarct in May, 2016.  I was able to get his hospital records from [facility] from his infarction.  He was admitted on 04/08/2015, but they reported left-sided weakness and dizziness.  He had an echocardiogram on 04/09/2015 with an ejection fraction of 60-65%.  An EEG was normal.  An MRA of the neck was normal.  An MRI of the brain stated that "DWI appears to demonstrate multiple punctate areas of high signal involving the cerebellar hemispheres and cerebellar vermis."  I did have the opportunity to review these images once they became available, and DWI was in fact positive in both cerebellar hemispheres and amongst the vermis.  His carotid ultrasound was  negative.  There were minimal notes regarding his speech but nothing stated that it was particularly slurred.  He was then recently hospitalized once again more on 07/10/2016 and I again reviewed those records.  He was hospitalized for inability to swallow.  Pt states that swallowing issues are with liquids and not solids because he was trying to take too much in the mouth at once.  An MRI without gadolinium was done.  I did not get those images.  There was reported to be "a few old cerebellar infarctions."  There is reported to be an old infarction in the left frontal region.  DWI was negative.  It was concluded that his swallowing issues were from Parkinson's disease and his refusal to take  levodopa.  He did have a barium swallow that was reported to show mild oropharyngeal dysphagia, but no dietary changes were made, with the exceptions of to take small bites and administer meds one at a time.  He ended up having a very detailed EMG at our office following these hospitalizations to rule out motor neuron disease and there was no evidence of this.  09/08/16 update:  Pt returns accompanied by his wife who supplements the history.  Since our last visit the patient had a DaT scan in 07/2016 and it was abnormal demonstrating decreased uptake, being virtually absent in the bilateral caudate and the putamen.  Wife does state that when he was last hospitalized, he had been given levodopa and it helped but was d/c in the hospital.   He also saw Dr. Rayann Heman and had a TEE on 08/18/16 and that was normal with the exception of evidence of a PFO.   He had a LE doppler on 08/18/16 and that was normal.   He had a loop recorder implanted on 08/18/16 and as of 08/28/16 there were no episodes of atrial fibrillation.   10/23/16 update:  Patient returns today, accompanied by his wife who supplements the history.  Last visit, I started the patient back on his levodopa, given the results of a DaT scan and history provided by the patient and family.  He states that as long as he takes it with food, he does well and he thinks that the levodopa is helping.  In regards to drooling, the patient states that this has not been as bad.  One fall, he was stooping and lighting the fireplace and fell backward.  He didn't get hurt.  He fell on his bottom.   No lightheadedness or near syncope.  His loop recorder has not picked up any episodes of atrial fibrillation.  Asks about filling out DMV form.  Asks about RX viagra.  PREVIOUS MEDICATIONS: Sinemet  ALLERGIES:   Allergies  Allergen Reactions  . Benazepril Other (See Comments)    Pt not sure of reaction.  . Doxycycline     Unknown  . Levofloxacin Hives and Swelling  .  Simvastatin Other (See Comments)    Pt does not remember what the reaction was.  . Sulfamethoxazole-Trimethoprim Hives and Swelling  . Tamsulosin Hives and Swelling  . Tramadol Hcl Hives and Swelling    CURRENT MEDICATIONS:  Outpatient Encounter Prescriptions as of 10/23/2016  Medication Sig  . atorvastatin (LIPITOR) 80 MG tablet Take 80 mg by mouth at bedtime.   . carbidopa-levodopa (SINEMET IR) 25-100 MG tablet Take 1 tablet by mouth 3 (three) times daily.  . clopidogrel (PLAVIX) 75 MG tablet Take 75 mg by mouth daily.   . meclizine (ANTIVERT) 25 MG  tablet Take 25 mg by mouth 3 (three) times daily as needed for dizziness.  . metoprolol succinate (TOPROL-XL) 100 MG 24 hr tablet Take 100 mg by mouth daily.    No facility-administered encounter medications on file as of 10/23/2016.     PAST MEDICAL HISTORY:   Past Medical History:  Diagnosis Date  . CHF (congestive heart failure) ([place])   . Hypercholesteremia   . Hypertension   . Parkinson's disease ([place])   . Stroke (cerebrum) ([place])     PAST SURGICAL HISTORY:   Past Surgical History:  Procedure Laterality Date  . APPENDECTOMY    . CATARACT EXTRACTION Right   . EP IMPLANTABLE DEVICE N/A 08/18/2016   Procedure: Loop Recorder Insertion;  Surgeon: Thompson Grayer, MD;  Location: [facility];  Service: Cardiovascular;  Laterality: N/A;  . REPAIR OF PERFORATED ULCER    . TEE WITHOUT CARDIOVERSION N/A 08/18/2016   Procedure: TRANSESOPHAGEAL ECHOCARDIOGRAM (TEE);  Surgeon: Sueanne Margarita, MD;  Location: [facility] ENDOSCOPY;  Service: Cardiovascular;  Laterality: N/A;  . TRANSURETHRAL RESECTION OF PROSTATE      SOCIAL HISTORY:   Social History   Social History  . Marital status: Married    Spouse name: N/A  . Number of children: N/A  . Years of education: N/A   Occupational History  . pastor    Social History Main Topics  . Smoking status: Never Smoker  . Smokeless tobacco: Never Used  . Alcohol use No  . Drug use: No  .  Sexual activity: Not on file   Other Topics Concern  . Not on file   Social History Narrative  . No narrative on file    FAMILY HISTORY:   Family Status  Relation Status  . Mother Deceased  . Father Deceased  . Sister Deceased  . Brother Deceased  . Daughter Alive   3, healthy  . Son Alive   2, healthy    ROS:  A complete 10 system review of systems was obtained and was unremarkable apart from what is mentioned above.  PHYSICAL EXAMINATION:    VITALS:   Vitals:   10/23/16 1409  BP: 124/72  Pulse: 61  Weight: 158 lb (71.7 kg)  Height: 5\' 7"  (1.702 m)    GEN:  The patient appears stated age and is in NAD.  Pt has laughter (likely PBA). HEENT:  Normocephalic, atraumatic.  The mucous membranes are moist. The superficial temporal arteries are without ropiness or tenderness.  CV:  RRR Lungs:  CTAB Neck/HEME:  There are no carotid bruits bilaterally.  Neurological examination:  Orientation: The patient is alert and oriented x3. Cranial nerves: There is good facial symmetry. There is facial hypomimia.  Pupils are equal round and reactive to light bilaterally. Fundoscopic exam reveals clear margins bilaterally. Extraocular muscles are intact. The visual fields are full to confrontational testing. The speech is dysarthric but fluent.  Soft palate rises symmetrically and there is no tongue deviation. Hearing is intact to conversational tone. Sensation: Sensation is intact to light and pinprick throughout (facial, trunk, extremities). Vibration is intact at the bilateral big toe. There is no extinction with double simultaneous stimulation. There is no sensory dermatomal level identified. Motor: Strength is 5/5 in the bilateral upper and lower extremities.   Shoulder shrug is equal and symmetric.  There is no pronator drift.   Movement examination: Tone: There is good tone today Abnormal movements: There is no tremor noted Coordination:  There is mild decremation with RAM's,  seen in the UE with any form of RAMS, including alternating supination and pronation of the forearm, hand opening and closing, finger taps, bilaterally, L more than R Gait and Station: The patient has no difficulty arising out of a deep-seated chair without the use of the hands. The patient has no start hesitation today.  Minimal dragging of the leg.  He is swinging the arms well today  ASSESSMENT/PLAN:  1.  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  Had negative doppler U/S of legs.  -has loop recorder  -on lipitor and Plavix 2.  Parkinsonism  -many of these sx's are "hidden" by after effects of infarct.  However, DaT scan in 07/2016 demonstrated marked decrease of radiotracer in bilateral caudate/putamen.    -feels better on carbidopa/levodopa 25/100 tid.  Move dosages closer together.  Take with carbohydrate but not with protein to reduce GI upset  -will fill out my part of DMV form but needs vision eval as well per DMV.  Also told him to call DMV.  Letter dated [id number] but said that license would be suspended after 09/15/16 if medical eval not completed by then.  I don't see mental or physical reason why patient cannot drive right now.  Has actually gotten stronger over the time that I have seen him.  Speech dysarthric but that doesn't affect driving skills. 3.  Sialorrhea, associated with prior cerebral infarction.  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 5.  ED  -told him that I don't RX viagra.  Ask PCP or cardiology about it and safety of it given medical issues. 6.  Total visit time was 30 minutes.  Greater than 50% of the visit was spent in counseling with the patient and his wife.

## 2016-10-17 NOTE — Progress Notes (Signed)
Carelink Summary Report / Loop Recorder 

## 2016-10-23 ENCOUNTER — Encounter: Payer: Self-pay | Admitting: Neurology

## 2016-10-23 ENCOUNTER — Telehealth: Payer: Self-pay | Admitting: Neurology

## 2016-10-23 ENCOUNTER — Ambulatory Visit (INDEPENDENT_AMBULATORY_CARE_PROVIDER_SITE_OTHER): Payer: Medicare HMO | Admitting: Neurology

## 2016-10-23 VITALS — BP 124/72 | HR 61 | Ht 67.0 in | Wt 158.0 lb

## 2016-10-23 DIAGNOSIS — K117 Disturbances of salivary secretion: Secondary | ICD-10-CM

## 2016-10-23 DIAGNOSIS — R471 Dysarthria and anarthria: Secondary | ICD-10-CM

## 2016-10-23 DIAGNOSIS — F482 Pseudobulbar affect: Secondary | ICD-10-CM | POA: Diagnosis not present

## 2016-10-23 DIAGNOSIS — G20 Parkinson's disease: Secondary | ICD-10-CM

## 2016-10-23 NOTE — Telephone Encounter (Signed)
2 p.m. Appointment today, would like to know if they need to hold carbidopa levodopa today. Please advise

## 2016-10-23 NOTE — Telephone Encounter (Signed)
Message relayed to patient. Verbalized understanding and denied questions. PT cannot come in until 2

## 2016-10-23 NOTE — Telephone Encounter (Signed)
Mike Nixon 11/09/1941. His wife Chrys Racer called needing to know should her husband Keeton take his Carbidopa Levodopa medication before he comes in at 2:00 today to see Dr. Carles Collet. His # is [id number]. Thank you

## 2016-10-23 NOTE — Telephone Encounter (Signed)
No take it.  And if he wants to come in now, he can.  My 11:15 cancelled and happy to see him then

## 2016-10-23 NOTE — Patient Instructions (Signed)
Please follow up in 4 months

## 2016-10-25 ENCOUNTER — Telehealth: Payer: Self-pay | Admitting: Neurology

## 2016-10-25 NOTE — Telephone Encounter (Signed)
DMV forms completed and faxed directly to [facility] per patient/wife request at [phone] with confirmation received.

## 2016-11-08 ENCOUNTER — Telehealth: Payer: Self-pay | Admitting: Internal Medicine

## 2016-11-08 DIAGNOSIS — Z024 Encounter for examination for driving license: Secondary | ICD-10-CM | POA: Diagnosis not present

## 2016-11-08 DIAGNOSIS — H401122 Primary open-angle glaucoma, left eye, moderate stage: Secondary | ICD-10-CM | POA: Diagnosis not present

## 2016-11-08 DIAGNOSIS — H2512 Age-related nuclear cataract, left eye: Secondary | ICD-10-CM | POA: Diagnosis not present

## 2016-11-08 DIAGNOSIS — H401111 Primary open-angle glaucoma, right eye, mild stage: Secondary | ICD-10-CM | POA: Diagnosis not present

## 2016-11-08 NOTE — Telephone Encounter (Signed)
Patient called asking about his battery life and also about doing a remote check.

## 2016-11-09 NOTE — Telephone Encounter (Signed)
Called pt back and let him know that we received his transmission from November and one was scheduled for December. Pt asked if should bring his monitor to this appointment, informed pt that he does not need to come to the [place] office for that apt, that his monitor would send [place] the transmission. Pt voiced understanding.

## 2016-11-16 ENCOUNTER — Ambulatory Visit (INDEPENDENT_AMBULATORY_CARE_PROVIDER_SITE_OTHER): Payer: Medicare HMO | Admitting: *Deleted

## 2016-11-16 DIAGNOSIS — I639 Cerebral infarction, unspecified: Secondary | ICD-10-CM

## 2016-11-16 NOTE — Progress Notes (Signed)
Carelink Summary Report / Loop Recorder 

## 2016-11-30 LAB — CUP PACEART REMOTE DEVICE CHECK
Date Time Interrogation Session: 20171121183822
Implantable Pulse Generator Implant Date: 20170922

## 2016-11-30 NOTE — Progress Notes (Signed)
Carelink summary report received. Battery status OK. Normal device function. No new symptom episodes, tachy episodes, brady, or pause episodes. No new AF episodes. Monthly summary reports and ROV/PRN 

## 2016-12-14 ENCOUNTER — Other Ambulatory Visit: Payer: Self-pay | Admitting: Neurology

## 2016-12-18 ENCOUNTER — Ambulatory Visit (INDEPENDENT_AMBULATORY_CARE_PROVIDER_SITE_OTHER): Payer: Medicare HMO | Admitting: *Deleted

## 2016-12-18 DIAGNOSIS — I639 Cerebral infarction, unspecified: Secondary | ICD-10-CM | POA: Diagnosis not present

## 2016-12-18 NOTE — Progress Notes (Signed)
Carelink Summary Report / Loop Recorder 

## 2016-12-27 ENCOUNTER — Telehealth: Payer: Self-pay | Admitting: Neurology

## 2016-12-27 MED ORDER — ONDANSETRON HCL 4 MG PO TABS: 4.0000 mg | ORAL_TABLET | Freq: Every day | ORAL | 2 refills | Status: DC

## 2016-12-27 NOTE — Telephone Encounter (Signed)
Have him try a zofran 4mg  before that dose (30 min) for a week or so and see if helps.  Call in 30 if able (if insurance allows)

## 2016-12-27 NOTE — Telephone Encounter (Signed)
Patient needs to talk to someone about his medication he is having some problems on it please call  [phone]

## 2016-12-27 NOTE — Telephone Encounter (Signed)
Spoke with patient and he states he has no problems after taking Levodopa in the morning or the evening- but after taking the midday dose he has a lot of nausea. He is taking this with Carbohydrates. He is not taking any other medications at this time and no new medications.  Please advise.

## 2017-01-03 LAB — CUP PACEART REMOTE DEVICE CHECK
Date Time Interrogation Session: 20171221190852
Implantable Pulse Generator Implant Date: 20170922

## 2017-01-10 ENCOUNTER — Telehealth: Payer: Self-pay | Admitting: Cardiology

## 2017-01-10 NOTE — Telephone Encounter (Signed)
LMOVM requesting that pt send manual transmission b/c home monitor has not updated in at least 14 days.    

## 2017-01-15 ENCOUNTER — Ambulatory Visit (INDEPENDENT_AMBULATORY_CARE_PROVIDER_SITE_OTHER): Payer: Medicare HMO | Admitting: *Deleted

## 2017-01-15 DIAGNOSIS — I639 Cerebral infarction, unspecified: Secondary | ICD-10-CM | POA: Diagnosis not present

## 2017-01-15 NOTE — Progress Notes (Signed)
Carelink Summary Report / Loop Recorder 

## 2017-01-18 ENCOUNTER — Encounter: Payer: Self-pay | Admitting: Cardiology

## 2017-01-19 ENCOUNTER — Telehealth: Payer: Self-pay | Admitting: Internal Medicine

## 2017-01-19 NOTE — Telephone Encounter (Signed)
Returned call to patient's daughter and yesterday he had a pain that was relieved Tylenol.  Most likely musculoskeletal pain.  He has been fine today.  He is in [place] with her and if continues to have problems will contact MD there

## 2017-01-19 NOTE — Telephone Encounter (Signed)
New Message  Requesting call back from nurse/Pt is with his daughter in [place]  Pt c/o of Chest Pain: STAT if CP now or developed within 24 hours  1. Are you having CP right now? No  2. Are you experiencing any other symptoms (ex. SOB, nausea, vomiting, sweating)? No... Just a burning in chest that stopped after taking extra strength tylenol  3. How long have you been experiencing CP? Started about 7:30 yesterday  4. Is your CP continuous or coming and going? Continuous   5. Have you taken Nitroglycerin? No ?

## 2017-01-20 LAB — CUP PACEART REMOTE DEVICE CHECK
Date Time Interrogation Session: 20180120193608
Implantable Pulse Generator Implant Date: 20170922

## 2017-01-22 ENCOUNTER — Telehealth: Payer: Self-pay | Admitting: Neurology

## 2017-01-22 NOTE — Telephone Encounter (Signed)
Left message on machine for patient to call back.

## 2017-01-22 NOTE — Telephone Encounter (Signed)
Patient apparently called the answering service after we closed on the evening of 01/18/2017.  I was in the operating room and not in the office on 01/19/2017, so am just getting this message (01/22/2017).  Patient apparently having weak spells off and on.  No other detail.  Just stated that patient was not sure if the weak spells were from Parkinson's or stroke.  They resolve after he lays down.  Jade, we'll call the patient and get some details on this.  If this does resolve after lying down, I am not sure if they are due to either Parkinson's or stroke, but would need more details.

## 2017-01-23 NOTE — Telephone Encounter (Signed)
Spoke with patient and his wife. They state he is having spells of weakness every 2-3 days. They only last about 15-20 minutes. There is no pattern to it- doesn't happen with certain activity. They don't state that laying down helps or resolves the problem. They aren't getting worse, just steady occurrence over the past 2-3 months. Please advise.

## 2017-01-23 NOTE — Telephone Encounter (Signed)
That doesn't really sound like PD or stroke.  Ask them to make appt with PCP and follow there first.

## 2017-01-23 NOTE — Telephone Encounter (Signed)
Patient's wife made aware.

## 2017-01-24 ENCOUNTER — Telehealth: Payer: Self-pay | Admitting: Internal Medicine

## 2017-01-24 NOTE — Telephone Encounter (Signed)
Spoke w/ pt wife and informed her that pt home monitor is updated and they don't have to do anything for the March 2018 appointment. Pt wife verbalized understanding.

## 2017-01-24 NOTE — Telephone Encounter (Signed)
New message    Pt wife does not know how to send in the home remote transmissions for husbands device. Would like assistance before his March appt.

## 2017-02-02 ENCOUNTER — Telehealth: Payer: Self-pay | Admitting: Neurology

## 2017-02-02 NOTE — Telephone Encounter (Signed)
-----   Message from Elenora Fender sent at 02/02/2017  2:14 PM EST ----- Regarding: AVS Patient said she will contact medical records but would like you to please mail her the AVS for the last 2 appointments.   Thank you

## 2017-02-02 NOTE — Telephone Encounter (Signed)
Last two AVS's printed and mailed to patient.

## 2017-02-02 NOTE — Telephone Encounter (Signed)
I can send AVS which they should have gotten. If they want office notes they will have to contact medical records at [phone]. Thank you.

## 2017-02-02 NOTE — Telephone Encounter (Signed)
Mike Nixon 05/28/1941. [name] (wife) # [phone]. Summary of the last 2 office visits. She would like to know if they can be mailed to her or pick them up. She said they live in [place]. Thank you

## 2017-02-05 LAB — CUP PACEART REMOTE DEVICE CHECK
Date Time Interrogation Session: 20180219194047
Implantable Pulse Generator Implant Date: 20170922

## 2017-02-06 DIAGNOSIS — I10 Essential (primary) hypertension: Secondary | ICD-10-CM | POA: Diagnosis not present

## 2017-02-06 DIAGNOSIS — R7301 Impaired fasting glucose: Secondary | ICD-10-CM | POA: Diagnosis not present

## 2017-02-06 DIAGNOSIS — E782 Mixed hyperlipidemia: Secondary | ICD-10-CM | POA: Diagnosis not present

## 2017-02-08 DIAGNOSIS — Z23 Encounter for immunization: Secondary | ICD-10-CM | POA: Diagnosis not present

## 2017-02-08 DIAGNOSIS — N183 Chronic kidney disease, stage 3 (moderate): Secondary | ICD-10-CM | POA: Diagnosis not present

## 2017-02-08 DIAGNOSIS — N401 Enlarged prostate with lower urinary tract symptoms: Secondary | ICD-10-CM | POA: Diagnosis not present

## 2017-02-08 DIAGNOSIS — I6930 Unspecified sequelae of cerebral infarction: Secondary | ICD-10-CM | POA: Diagnosis not present

## 2017-02-08 DIAGNOSIS — I10 Essential (primary) hypertension: Secondary | ICD-10-CM | POA: Diagnosis not present

## 2017-02-08 DIAGNOSIS — R7301 Impaired fasting glucose: Secondary | ICD-10-CM | POA: Diagnosis not present

## 2017-02-08 DIAGNOSIS — E782 Mixed hyperlipidemia: Secondary | ICD-10-CM | POA: Diagnosis not present

## 2017-02-08 DIAGNOSIS — R69 Illness, unspecified: Secondary | ICD-10-CM | POA: Diagnosis not present

## 2017-02-14 ENCOUNTER — Ambulatory Visit (INDEPENDENT_AMBULATORY_CARE_PROVIDER_SITE_OTHER): Payer: Medicare HMO | Admitting: *Deleted

## 2017-02-14 DIAGNOSIS — I639 Cerebral infarction, unspecified: Secondary | ICD-10-CM

## 2017-02-15 NOTE — Progress Notes (Signed)
Carelink Summary Report / Loop Recorder 

## 2017-02-20 NOTE — Progress Notes (Deleted)
Mike Nixon was seen today in the movement disorders clinic for neurologic consultation at the request of [name], MD.  The consultation is for the evaluation of PD.  No records are available to me from his prior neurologist.   Therefore, all history was obtained from the patient and wife.  He was previously being seen in [place], [place].  He was dx with with PD about 3 years ago and wife states that his first sx was was slow handwriting and slow to eat and slight jaw tremor.  He was started on carbidopa/levodopa 10/100 tid and pt doesn't think that it helped.  It was taking it at 8am/1pm/8-9pm.  Lunchtime is at noon.  He takes the last at bedtime.  However, he admits that he has not taken it for months.  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  When she got to him, he was just laying on the floor.  They called 911.  He was not on ASA at the time.  He was put on plavix and lipitor.  Speech has been biggest issue since the stroke.  States that he never went through speech therapy, even when he was in rehab.  He did physical therapy only.  R leg remains a bit weak after stroke.  07/18/16 update:  The patient is seen today in follow-up.  I have reviewed many records from several hospitalizations and also his prior neurologist records since our last visit.  He was first seen by his prior neurologist in [place] on 06/15/2014 with complaints of speech changes and dragging his right leg and trouble buttoning his clothing.  On examination, she reports no bradykinesia at that visit, but she reports that he had lip tremor.  He was dragging his right leg and his rapid alternating movements were normal.  She did an MRI of the brain just following that visit and this was done on 07/06/2014.  There was a small acute right basal ganglia infarct.  He followed up with her just after that in August, 2015 and was started on levodopa, 10/100 3 times per day, as she felt  that his infarct was acute, whereas his symptoms were for 6 months and he had right leg symptoms and right sided infarct.  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  In September, 2015 the notes said that the patient noted significant improvement on aspirin and a statin.  He was then hospitalized in May, 2016 and she followed up with him after that and said that there was just mild slurring of speech.  On 07/19/2015 she did have him wearing an event monitor for one month that was negative.  It does appear that he had been off of levodopa ever since his infarct in May, 2016.  I was able to get his hospital records from [facility] from his infarction.  He was admitted on 04/08/2015, but they reported left-sided weakness and dizziness.  He had an echocardiogram on 04/09/2015 with an ejection fraction of 60-65%.  An EEG was normal.  An MRA of the neck was normal.  An MRI of the brain stated that "DWI appears to demonstrate multiple punctate areas of high signal involving the cerebellar hemispheres and cerebellar vermis."  I did have the opportunity to review these images once they became available, and DWI was in fact positive in both cerebellar hemispheres and amongst the vermis.  His carotid ultrasound was  negative.  There were minimal notes regarding his speech but nothing stated that it was particularly slurred.  He was then recently hospitalized once again more on 07/10/2016 and I again reviewed those records.  He was hospitalized for inability to swallow.  Pt states that swallowing issues are with liquids and not solids because he was trying to take too much in the mouth at once.  An MRI without gadolinium was done.  I did not get those images.  There was reported to be "a few old cerebellar infarctions."  There is reported to be an old infarction in the left frontal region.  DWI was negative.  It was concluded that his swallowing issues were from Parkinson's disease and his refusal to take  levodopa.  He did have a barium swallow that was reported to show mild oropharyngeal dysphagia, but no dietary changes were made, with the exceptions of to take small bites and administer meds one at a time.  He ended up having a very detailed EMG at our office following these hospitalizations to rule out motor neuron disease and there was no evidence of this.  09/08/16 update:  Pt returns accompanied by his wife who supplements the history.  Since our last visit the patient had a DaT scan in 07/2016 and it was abnormal demonstrating decreased uptake, being virtually absent in the bilateral caudate and the putamen.  Wife does state that when he was last hospitalized, he had been given levodopa and it helped but was d/c in the hospital.   He also saw Dr. Rayann Heman and had a TEE on 08/18/16 and that was normal with the exception of evidence of a PFO.   He had a LE doppler on 08/18/16 and that was normal.   He had a loop recorder implanted on 08/18/16 and as of 08/28/16 there were no episodes of atrial fibrillation.   10/23/16 update:  Patient returns today, accompanied by his wife who supplements the history.  Last visit, I started the patient back on his levodopa, given the results of a DaT scan and history provided by the patient and family.  He states that as long as he takes it with food, he does well and he thinks that the levodopa is helping.  In regards to drooling, the patient states that this has not been as bad.  One fall, he was stooping and lighting the fireplace and fell backward.  He didn't get hurt.  He fell on his bottom.   No lightheadedness or near syncope.  His loop recorder has not picked up any episodes of atrial fibrillation.  Asks about filling out DMV form.  Asks about RX viagra.  02/21/17 update:  Pt returns for follow up.  On carbidopa/levodopa 25/100 tid.  Called and said that AM and noon dose he has no issues with but evening dose he has some nausea with so gave some zofran and he states that  ***.  No falls.    PREVIOUS MEDICATIONS: Sinemet  ALLERGIES:   Allergies  Allergen Reactions  . Benazepril Other (See Comments)    Pt not sure of reaction.  . Doxycycline     Unknown  . Levofloxacin Hives and Swelling  . Simvastatin Other (See Comments)    Pt does not remember what the reaction was.  . Sulfamethoxazole-Trimethoprim Hives and Swelling  . Tamsulosin Hives and Swelling  . Tramadol Hcl Hives and Swelling    CURRENT MEDICATIONS:  Outpatient Encounter Prescriptions as of 02/22/2017  Medication Sig  .  atorvastatin (LIPITOR) 80 MG tablet Take 80 mg by mouth at bedtime.   . carbidopa-levodopa (SINEMET IR) 25-100 MG tablet TAKE 1 TABLET BY MOUTH 3 TIMES A DAY  . clopidogrel (PLAVIX) 75 MG tablet Take 75 mg by mouth daily.   . meclizine (ANTIVERT) 25 MG tablet Take 25 mg by mouth 3 (three) times daily as needed for dizziness.  . metoprolol succinate (TOPROL-XL) 100 MG 24 hr tablet Take 100 mg by mouth daily.   . ondansetron (ZOFRAN) 4 MG tablet Take 1 tablet (4 mg total) by mouth daily.   No facility-administered encounter medications on file as of 02/22/2017.     PAST MEDICAL HISTORY:   Past Medical History:  Diagnosis Date  . CHF (congestive heart failure) ([place])   . Hypercholesteremia   . Hypertension   . Parkinson's disease ([place])   . Stroke (cerebrum) ([place])     PAST SURGICAL HISTORY:   Past Surgical History:  Procedure Laterality Date  . APPENDECTOMY    . CATARACT EXTRACTION Right   . EP IMPLANTABLE DEVICE N/A 08/18/2016   Procedure: Loop Recorder Insertion;  Surgeon: Thompson Grayer, MD;  Location: [facility];  Service: Cardiovascular;  Laterality: N/A;  . REPAIR OF PERFORATED ULCER    . TEE WITHOUT CARDIOVERSION N/A 08/18/2016   Procedure: TRANSESOPHAGEAL ECHOCARDIOGRAM (TEE);  Surgeon: Sueanne Margarita, MD;  Location: [facility];  Service: Cardiovascular;  Laterality: N/A;  . TRANSURETHRAL RESECTION OF PROSTATE      SOCIAL HISTORY:   Social History    Social History  . Marital status: Married    Spouse name: N/A  . Number of children: N/A  . Years of education: N/A   Occupational History  . pastor    Social History Main Topics  . Smoking status: Never Smoker  . Smokeless tobacco: Never Used  . Alcohol use No  . Drug use: No  . Sexual activity: Not on file   Other Topics Concern  . Not on file   Social History Narrative  . No narrative on file    FAMILY HISTORY:   Family Status  Relation Status  . Mother Deceased  . Father Deceased  . Sister Deceased  . Brother Deceased  . Daughter Alive   3, healthy  . Son Alive   2, healthy    ROS:  A complete 10 system review of systems was obtained and was unremarkable apart from what is mentioned above.  PHYSICAL EXAMINATION:    VITALS:   There were no vitals filed for this visit.  GEN:  The patient appears stated age and is in NAD.  Pt has laughter (likely PBA). HEENT:  Normocephalic, atraumatic.  The mucous membranes are moist. The superficial temporal arteries are without ropiness or tenderness.  CV:  RRR Lungs:  CTAB Neck/HEME:  There are no carotid bruits bilaterally.  Neurological examination:  Orientation: The patient is alert and oriented x3. Cranial nerves: There is good facial symmetry. There is facial hypomimia.  Pupils are equal round and reactive to light bilaterally. Fundoscopic exam reveals clear margins bilaterally. Extraocular muscles are intact. The visual fields are full to confrontational testing. The speech is dysarthric but fluent.  Soft palate rises symmetrically and there is no tongue deviation. Hearing is intact to conversational tone. Sensation: Sensation is intact to light and pinprick throughout (facial, trunk, extremities). Vibration is intact at the bilateral big toe. There is no extinction with double simultaneous stimulation. There is no sensory dermatomal level identified. Motor: Strength  is 5/5 in the bilateral upper and lower  extremities.   Shoulder shrug is equal and symmetric.  There is no pronator drift.   Movement examination: Tone: There is good tone today Abnormal movements: There is no tremor noted Coordination:  There is mild decremation with RAM's, seen in the UE with any form of RAMS, including alternating supination and pronation of the forearm, hand opening and closing, finger taps, bilaterally, L more than R Gait and Station: The patient has no difficulty arising out of a deep-seated chair without the use of the hands. The patient has no start hesitation today.  Minimal dragging of the leg.  He is swinging the arms well today  ASSESSMENT/PLAN:  1.  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  Had negative doppler U/S of legs.  -has loop recorder  -on lipitor and Plavix 2.  Parkinsonism  -many of these sx's are "hidden" by after effects of infarct.  However, DaT scan in 07/2016 demonstrated marked decrease of radiotracer in bilateral caudate/putamen.    -feels better on carbidopa/levodopa 25/100 tid.  Move dosages closer together.  Take with carbohydrate but not with protein to reduce GI upset  -will fill out my part of DMV form but needs vision eval as well per DMV.  Also told him to call DMV.  Letter dated 05/39/76 but said that license would be suspended after 09/15/16 if medical eval not completed by then.  I don't see mental or physical reason why patient cannot drive right now.  Has actually gotten stronger over the time that I have seen him.  Speech dysarthric but that doesn't affect driving skills. 3.  Sialorrhea, associated with prior cerebral infarction.  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 5.  ED  -told him that I don't RX viagra.  Ask PCP or  cardiology about it and safety of it given medical issues. 6.  Total visit time was 30 minutes.  Greater than 50% of the visit was spent in counseling with the patient and his wife.

## 2017-02-22 ENCOUNTER — Ambulatory Visit: Payer: Medicare HMO | Admitting: Neurology

## 2017-02-26 LAB — CUP PACEART REMOTE DEVICE CHECK
Date Time Interrogation Session: 20180321194326
Implantable Pulse Generator Implant Date: 20170922

## 2017-02-27 DIAGNOSIS — B351 Tinea unguium: Secondary | ICD-10-CM | POA: Diagnosis not present

## 2017-02-27 DIAGNOSIS — M79674 Pain in right toe(s): Secondary | ICD-10-CM | POA: Diagnosis not present

## 2017-02-27 DIAGNOSIS — M79675 Pain in left toe(s): Secondary | ICD-10-CM | POA: Diagnosis not present

## 2017-03-08 ENCOUNTER — Ambulatory Visit (INDEPENDENT_AMBULATORY_CARE_PROVIDER_SITE_OTHER): Payer: Medicare (Managed Care) | Admitting: Neurology

## 2017-03-08 ENCOUNTER — Encounter (INDEPENDENT_AMBULATORY_CARE_PROVIDER_SITE_OTHER): Payer: Self-pay | Admitting: Neurology

## 2017-03-08 VITALS — BP 168/93 | HR 62

## 2017-03-08 DIAGNOSIS — G20 Parkinson's disease: Secondary | ICD-10-CM

## 2017-03-08 DIAGNOSIS — R270 Ataxia, unspecified: Secondary | ICD-10-CM

## 2017-03-08 DIAGNOSIS — Z8673 Personal history of transient ischemic attack (TIA), and cerebral infarction without residual deficits: Secondary | ICD-10-CM

## 2017-03-08 DIAGNOSIS — I639 Cerebral infarction, unspecified: Secondary | ICD-10-CM

## 2017-03-08 DIAGNOSIS — R471 Dysarthria and anarthria: Secondary | ICD-10-CM

## 2017-03-08 MED ORDER — CARBIDOPA-LEVODOPA ER 48.75-195 MG PO CPCR
1.0000 | ORAL_CAPSULE | Freq: Three times a day (TID) | ORAL | 3 refills | Status: DC
Start: 2017-03-08 — End: 2017-04-18

## 2017-03-08 NOTE — Patient Instructions (Addendum)
[email or web address].fi    Our plan:     1) Stop Sinemet (carbidopa/levodopa) and begin Rytary, 195mg  3x daily about 6 hours apart. This capsule can be opened up and sprinkled on a spoon of applesauce.    I am going to sign you up for the [facility] program which will help with any cost issues.     2) Restart physical therapy (BIG therapy) and speech (LOUD therapy) therapy.     Return to clinic in 6 weeks please.    Today's Visit:      In today's visit I reviewed your medications and records relating your health - prior testing, blood work, reports of other health care providers present in your electronic medical record.     If you have records from non-[facility] doctors please send to [place] or bring to next office visit.     A copy of today's visit will be sent to your referring doctor and/or primary care doctor.    Let me know if there are things we could have done better for your office visit.    Patient satisfaction survey:      If you receive a patient satisfaction survey, I would greatly appreciate it if you would complete it.     We are like a car dealer where we aim for a score of 9 or 10.  If you had an experience that was less than a 9 or 10, please let me know so we can improve. If you had a good experience today, please let us know too.  We value your feedback.     Contact me online:      Patient Portal online - Please sign up for MyChart -- this is the best way to communicate me.  There is a messaging feature which can send messages directly to my inbox.  It is the best way to communicate with me and get test results, medication refills or ask questions.     You can expect to get a response within 24-48 hours during weekdays - if you do not, resend your request and inform us we did not respond. My goal is for every question answered as soon as possible. Average response for a phone call is 3 days due to the volume we receive (which is why MyChart is preferred).    MyChart should not be used if  you are having a medical emergency -- call 911 in that case.     Coupons for medication:      If you would like to see if samples or vouchers available for your medicines please consider checking online.  Most medicines have web sites where you can print coupons or vouchers for you co-pay.     For example: AutomobileBuzz.is,  DSLRemote.se, Namendaxr.com, [email or web address]://www.[email or web address]/, Rytary.com, Vimpat.com    Thank you for trusting me with your health.      Take care,      Kino Dunsworth "Johnston Ebbs, MD  Co-Director  Movement Disorders Specialist  [place] [facility]  [facility]    [email or web address]      [address]  [address]  [place] [address]    [place], [place] [address]  T [phone]  F [phone]   T [phone]  F [phone]

## 2017-03-08 NOTE — Progress Notes (Deleted)
Weak spells happen in the morning, he tells family that he's weak. Can get to lie down, but doesn't feel dizzy, woozy or lightheaded.  He feels that he starts to feel weak after the medicine.     Last complete PT in December last year.

## 2017-03-08 NOTE — Progress Notes (Signed)
[email or web address].fi    Subjective:      76 y/o M with hx HTN, HLD, borderline DM, glaucoma, prior CVA, presents to Movement Disorders clinic for evaluation of possible Parkinson's Disease.     History is mainly from family. He was diagnosed with Parkinson's Disease in 2015, with initial symptoms being changes to his gait.  He was walking slower and his movements were slowed overall.  They report also noting tremors in the legs and hands, unclear laterality. He noticed the tremors throughout the day, but the daughter notices it more with activity, when feeding himself.     The diagnosis of Parkinson's was considered, so he was started on Sinemet, however did not take it because it gave him GI issues. During that year, he had a number of TIAs, but then in 2016 had a completed CVA.  Described a 'clot in the back of his head' by family.  Afterwards, he was having issues with speech and swallowing. Swallow study was normal.     At some point then, the dx of Parkinson's was questioned, but "tests" were completed that confirmed, though unclear what that was.  Was put back on Sinemet.  Once he is on Sinemet, his walking and tightness improves, but they are concerned about progression as he is falling frequently now. No cognitive changes, just difficulty talking.    Weak spells happen in the morning, he tells family that he's weak. Can get to lie down, but doesn't feel dizzy, woozy or lightheaded.  He feels that he starts to feel weak after the medicine.     Last complete PT in December last year. He has completed SLT which helped.    + uncontrollable laughing and crying noted, depression/anxiety and fatigue.     No family history of Parkinson's Disease or tremor. No history significant head injury or LOC.  No history of neuroleptic or chemical exposure.     The following portions of the patient's history were reviewed and updated as appropriate: allergies, current medications, past medical history,  past social history, past surgical history and problem list.    Review of Systems  + vocal changes, gait disorder.  No recent illness. Denies fever, chills, cough, sinus pain, eye pain, eye redness, ear pain, rhinorrhea, sore throat, chest pain, SOB, wheezing, abd pain, Nausea, Vomiting, diarrhea, constipation, dysuria, or rashes.  All other systems reviewed and are negative except as previously noted in the HPI.    Objective:       Vitals:    03/08/17 1342   BP: (!) 168/93   Pulse: 62     Constitutional: NAD   Eyes: Clear conjunctiva  Cardiovascular: RRR  Respiratory: CTA B   Musculoskeletal: stooped posture, normal muscle bulk.  Integumentary: No abnormal rash noted  Psych: Flat affect, decreased blink rate.    Neurological:   Mental Status: Alert & oriented to person, place, month, & year.   Language: Very dysarthric.     Cranial Nerves:  II, III: PERRL  III, IV, VI: Mild limitation to vertical gaze, No nystagmus, no palsies, no ptosis  V: Intact to LT V1-V3 distribution bilaterally.   VI: Symmetrical face and expression.   VIII: Hearing intact to finger rub bilaterally.   IX, X: Palate/Uvula elevates symmetrically.   XI: 5/5 Trapezius & SCM bilaterally.   XII: Tongue is midline.     Motor Exam: Strength 5/5 throughout and symmetric.    Sensation: Sensation to LT intact grossly through symmetrically. Romberg negative.  Cerebellar:   Finger to Nose: intact bilaterally with mild terminal dysmetria on FTN.    Reflexes: 2+ throughout, symmetric, with down-going toes. Negative Hoffman bilaterally.     Station/ Gait: Narrow based, short stepped.  Mild Shuffle.  Absent arm swing bilaterally.  Stooped.     + glabellar, snout    Negative applause sign.    Tone increased throughout, cogwheeling noted on L. Fine motor use of both hands is slowed to RAM, finger tapping and open/close, L>R.  Foot tapping also reduced in this laterality.     Assessment:     76 y/o M with hx HTN, HLD, borderline DM, glaucoma, prior CVA,  presents to Movement Disorders clinic for evaluation of possible Parkinson's Disease. By history, exam as above, positive DATscan reported, this does fit with a diagnosis of Parkinson's Disease, but there is significant overlay from his prior stroke. It is unclear what symptoms are CVA related and which are PD related.  To that end, he is applying secondary stroke prevention agents, so we will try to improve his Parkinson's medication delivery to see what improves.     We discussed Parkinson's at length, including the motor and non-motor symptoms, prognosis, possibilities for progression, treatment options, and expectations. The goal for long-term functional stability is to achieve predictability, consistency, and smoothness of medication delivery.  We also discussed the need to stay physically and mentally active.     I'm concerned because he is feeling the on-off of levodopa, which is only lasting him a few hours.  To counter this, we'll change from sinemet to long-acting, smoother levodopa Rytary. The dose will be 1 capsule 195mg  TID. Discussed dosing, treatment expectations and s/e of which to be aware. Answered all questions regarding this medication. Rytary can also be opened and sprinkled on a spoon of applesauce to administer (since he has some issues swallowing as a result of his CVA).    He is also to begin BIG PT, followed by LOUD SLT.  Referral given.     Plan:     1) Parkinson's Disease -   - Stop Sinemet and begin Rytary, 1 capsule 195mg  TID.    2) Gait disorder -   - Begin BIG PT.    3) Dysarthria -   - Begin LOUD SLT after PT.    4) Prior CVA - continue Plavix and statin for secondary stroke prevention.  - Loop recorder implanted currently.     RTC in 6 weeks. Patient can follow up sooner if needed. In the meantime, patient will contact the office with any questions or concerns.     -----------------------------------------------------------    Additional notes and data scanned including patient  questionnaire which may contain pertinent information to visit.     More than 50% of this 60 min office visit was spent counseling the patient on:   Pt's specific disease state including discussion about the medical condition, diagnostic and treatment options, as well as the plan as above.  We also discussed medication options, what pt is taking, indications and side effects. Finally we discussed specific lifestyle issues related to pt's condition including need for exercise and other lifestyle modifications.      Jnae Thomaston "Johnston Ebbs, MD  Co-Director  Movement Disorders Specialist  [place] [facility]  [facility]    [address]  [address]  ,[place] [address]    [place], [place] [address]  T [phone]  F [phone]   T [phone]  F [phone]     [email or web address]

## 2017-03-09 ENCOUNTER — Encounter (INDEPENDENT_AMBULATORY_CARE_PROVIDER_SITE_OTHER): Payer: Self-pay | Admitting: Neurology

## 2017-03-09 DIAGNOSIS — I639 Cerebral infarction, unspecified: Secondary | ICD-10-CM | POA: Insufficient documentation

## 2017-03-12 ENCOUNTER — Telehealth: Payer: Self-pay | Admitting: Neurology

## 2017-03-12 NOTE — Telephone Encounter (Signed)
Caller: wife  Urgent? no  Reason for the call: VM did not leave the reason, wanted a call back.

## 2017-03-12 NOTE — Telephone Encounter (Signed)
I called and spoke with patient's wife. She wanted to know if any programs here did the [facility] and Loud therapy. I made her aware this is done at the neuro rehab center at [facility]. They had seen another neurologist for a second opinion and this was recommended. I gave them the number to the neuro rehab center but made them aware if they needed a referral from [place] that they would need to make a follow up. We have not seen him since November.

## 2017-03-13 DIAGNOSIS — G20 Parkinson's disease: Secondary | ICD-10-CM | POA: Diagnosis not present

## 2017-03-13 DIAGNOSIS — N183 Chronic kidney disease, stage 3 (moderate): Secondary | ICD-10-CM | POA: Diagnosis not present

## 2017-03-13 DIAGNOSIS — I10 Essential (primary) hypertension: Secondary | ICD-10-CM | POA: Diagnosis not present

## 2017-03-13 DIAGNOSIS — R319 Hematuria, unspecified: Secondary | ICD-10-CM | POA: Diagnosis not present

## 2017-03-13 DIAGNOSIS — Z6824 Body mass index (BMI) 24.0-24.9, adult: Secondary | ICD-10-CM | POA: Diagnosis not present

## 2017-03-16 ENCOUNTER — Other Ambulatory Visit: Payer: Self-pay | Admitting: Neurology

## 2017-03-16 ENCOUNTER — Ambulatory Visit (INDEPENDENT_AMBULATORY_CARE_PROVIDER_SITE_OTHER): Payer: Medicare HMO | Admitting: *Deleted

## 2017-03-16 DIAGNOSIS — I639 Cerebral infarction, unspecified: Secondary | ICD-10-CM

## 2017-03-19 DIAGNOSIS — K4090 Unilateral inguinal hernia, without obstruction or gangrene, not specified as recurrent: Secondary | ICD-10-CM | POA: Diagnosis not present

## 2017-03-19 DIAGNOSIS — I700 Atherosclerosis of aorta: Secondary | ICD-10-CM | POA: Diagnosis not present

## 2017-03-19 DIAGNOSIS — N400 Enlarged prostate without lower urinary tract symptoms: Secondary | ICD-10-CM | POA: Diagnosis not present

## 2017-03-19 DIAGNOSIS — I509 Heart failure, unspecified: Secondary | ICD-10-CM | POA: Diagnosis not present

## 2017-03-19 DIAGNOSIS — G20 Parkinson's disease: Secondary | ICD-10-CM | POA: Diagnosis not present

## 2017-03-19 DIAGNOSIS — N3289 Other specified disorders of bladder: Secondary | ICD-10-CM | POA: Diagnosis not present

## 2017-03-19 DIAGNOSIS — R319 Hematuria, unspecified: Secondary | ICD-10-CM | POA: Diagnosis not present

## 2017-03-19 DIAGNOSIS — I10 Essential (primary) hypertension: Secondary | ICD-10-CM | POA: Diagnosis not present

## 2017-03-19 NOTE — Progress Notes (Signed)
Carelink Summary Report / Loop Recorder 

## 2017-03-21 ENCOUNTER — Telehealth (HOSPITAL_COMMUNITY): Payer: Self-pay | Admitting: Physical Therapy

## 2017-03-21 NOTE — Telephone Encounter (Signed)
Provided extensive education to patient's daughter regarding differences between Big and Loud and [facility] Parkinsons training program. Educated that PWR has been developed by same clinicians that developed Big and Loud, and that [facility]! Has been shown to be more effective and functional in Parkinsons Rehab, is also the system wide Parkinsons program of choice in [place]. PT/OT here is certified in [place] we also offer Speech Therapy for Parkinsons but this is not a [facility]WR program. Discussed PT patient received before and differences between this and [facility] Training, benefits of [facility] Training.   Daughter appears satisfied with education provided today and reports she is going to speak to patient's MD/neurologist, will call this clinic back to let us know what they would like to do.   Deniece Ree PT, DPT [phone]

## 2017-03-22 ENCOUNTER — Encounter (INDEPENDENT_AMBULATORY_CARE_PROVIDER_SITE_OTHER): Payer: Self-pay | Admitting: Neurology

## 2017-03-23 LAB — CUP PACEART REMOTE DEVICE CHECK
Date Time Interrogation Session: 20180420200937
Implantable Pulse Generator Implant Date: 20170922

## 2017-03-28 ENCOUNTER — Encounter (INDEPENDENT_AMBULATORY_CARE_PROVIDER_SITE_OTHER): Payer: Self-pay

## 2017-03-28 NOTE — Progress Notes (Signed)
RYTARY DOESN'T REQUIRE A PA PER [employer].

## 2017-03-29 DIAGNOSIS — R310 Gross hematuria: Secondary | ICD-10-CM | POA: Diagnosis not present

## 2017-04-04 ENCOUNTER — Other Ambulatory Visit: Payer: Self-pay | Admitting: Urology

## 2017-04-16 ENCOUNTER — Ambulatory Visit (INDEPENDENT_AMBULATORY_CARE_PROVIDER_SITE_OTHER): Payer: Medicare HMO | Admitting: *Deleted

## 2017-04-16 DIAGNOSIS — I639 Cerebral infarction, unspecified: Secondary | ICD-10-CM

## 2017-04-16 NOTE — Progress Notes (Signed)
Carelink Summary Report / Loop Recorder 

## 2017-04-18 ENCOUNTER — Ambulatory Visit (INDEPENDENT_AMBULATORY_CARE_PROVIDER_SITE_OTHER): Payer: Medicare (Managed Care) | Admitting: Neurology

## 2017-04-18 ENCOUNTER — Encounter (INDEPENDENT_AMBULATORY_CARE_PROVIDER_SITE_OTHER): Payer: Self-pay | Admitting: Neurology

## 2017-04-18 VITALS — BP 161/94 | HR 78

## 2017-04-18 DIAGNOSIS — R471 Dysarthria and anarthria: Secondary | ICD-10-CM

## 2017-04-18 DIAGNOSIS — R270 Ataxia, unspecified: Secondary | ICD-10-CM

## 2017-04-18 DIAGNOSIS — G20 Parkinson's disease: Secondary | ICD-10-CM

## 2017-04-18 DIAGNOSIS — I639 Cerebral infarction, unspecified: Secondary | ICD-10-CM

## 2017-04-18 MED ORDER — CARBIDOPA-LEVODOPA ER 36.25-145 MG PO CPCR
2.0000 | ORAL_CAPSULE | Freq: Three times a day (TID) | ORAL | 3 refills | Status: DC
Start: 2017-04-18 — End: 2017-07-06

## 2017-04-18 NOTE — Addendum Note (Signed)
Addended by: Sharlet Salina A on: 04/18/2017 04:14 PM     Modules accepted: Level of Service

## 2017-04-18 NOTE — Patient Instructions (Signed)
[email or web address]    Our plan:     1) Change Rytary to be 2 capsules of 145mg  3x daily. Let me know if this makes things worse or better.    2) Good luck with the bladder issues and surgery; begin PT and speech after.    Return to clinic in 2 months.    Today's Visit:      In today's visit I reviewed your medications and records relating your health - prior testing, blood work, reports of other health care providers present in your electronic medical record.     If you have records from non-[facility] doctors please send to [place] or bring to next office visit.     A copy of today's visit will be sent to your referring doctor and/or primary care doctor.    Let me know if there are things we could have done better for your office visit.    Patient satisfaction survey:      If you receive a patient satisfaction survey, I would greatly appreciate it if you would complete it.     We are like a car dealer where we aim for a score of 9 or 10.  If you had an experience that was less than a 9 or 10, please let me know so we can improve. If you had a good experience today, please let us know too.  We value your feedback.     Contact me online:      Patient Portal online - Please sign up for MyChart -- this is the best way to communicate me.  There is a messaging feature which can send messages directly to my inbox.  It is the best way to communicate with me and get test results, medication refills or ask questions.     You can expect to get a response within 24-48 hours during weekdays - if you do not, resend your request and inform us we did not respond. My goal is for every question answered as soon as possible. Average response for a phone call is 3 days due to the volume we receive (which is why MyChart is preferred).    MyChart should not be used if you are having a medical emergency -- call 911 in that case.     Coupons for medication:      If you would like to see if samples or vouchers available for  your medicines please consider checking online.  Most medicines have web sites where you can print coupons or vouchers for you co-pay.     For example: AutomobileBuzz.is,  DSLRemote.se, Namendaxr.com, http://www.walker-hill.info/, Rytary.com, Vimpat.com    Thank you for trusting me with your health.      Take care,      Jaking Thayer "Johnston Ebbs, MD  Co-Director  Movement Disorders Specialist  [facility]  [facility]    [email or web address]      [address]  [address], [address]  [place] [address]    [place], [place] [address]  T [phone]  F [phone]   T [phone]  F [phone]

## 2017-04-18 NOTE — Progress Notes (Addendum)
[email or web address]    Subjective:      76 y/o M with hx HTN, HLD, borderline DM, glaucoma, prior CVA, presents to Movement Disorders clinic for f/u of possible Parkinson's Disease.     At last visit, switched from Sinemet to Rytary.  Up front, had a period where the speech sounded 'wonderful', improved. But then dropped off again. Has fallen twice since last visit. No tremors since last visit, with some slight movement, and some with eating. Swallowing is improved as well. No LH/D, is fatigued, sleeping a lot.  No falls.     He has a bladder issue schedule for the beginning of June, held PT/OT until after.    -------------------------------  History retained from initial consultation:    History is mainly from family. He was diagnosed with Parkinson's Disease in 2015, with initial symptoms being changes to his gait.  He was walking slower and his movements were slowed overall.  They report also noting tremors in the legs and hands, unclear laterality. He noticed the tremors throughout the day, but the daughter notices it more with activity, when feeding himself.     The diagnosis of Parkinson's was considered, so he was started on Sinemet, however did not take it because it gave him GI issues. During that year, he had a number of TIAs, but then in 2016 had a completed CVA.  Described a 'clot in the back of his head' by family.  Afterwards, he was having issues with speech and swallowing. Swallow study was normal.     At some point then, the dx of Parkinson's was questioned, but "tests" were completed that confirmed, though unclear what that was.  Was put back on Sinemet.  Once he is on Sinemet, his walking and tightness improves, but they are concerned about progression as he is falling frequently now. No cognitive changes, just difficulty talking.    Weak spells happen in the morning, he tells family that he's weak. Can get to lie down, but doesn't feel dizzy, woozy or lightheaded.  He feels  that he starts to feel weak after the medicine.     Last complete PT in December last year. He has completed SLT which helped.    + uncontrollable laughing and crying noted, depression/anxiety and fatigue.     No family history of Parkinson's Disease or tremor. No history significant head injury or LOC.  No history of neuroleptic or chemical exposure.     The following portions of the patient's history were reviewed and updated as appropriate: allergies, current medications, past medical history, past social history, past surgical history and problem list.    Review of Systems  + vocal changes, gait disorder.  No recent illness. Denies fever, chills, cough, sinus pain, eye pain, eye redness, ear pain, rhinorrhea, sore throat, chest pain, SOB, wheezing, abd pain, Nausea, Vomiting, diarrhea, constipation, dysuria, or rashes.  All other systems reviewed and are negative except as previously noted in the HPI.    Objective:     Vitals:    04/18/17 1145   BP: (!) 161/94   Pulse: 78     Constitutional: NAD   Eyes: Clear conjunctiva  Cardiovascular: RRR  Respiratory: CTA B   Musculoskeletal: stooped posture, normal muscle bulk.  Integumentary: No abnormal rash noted  Psych: Flat affect, decreased blink rate.    Neurological:   Mental Status: Alert & oriented to person, place, month, & year.   Language: Very dysarthric.     Cranial Nerves:  II,  III: PERRL  III, IV, VI: Mild limitation to vertical gaze, No nystagmus, no palsies, no ptosis  V: Intact to LT V1-V3 distribution bilaterally.   VI: Symmetrical face and expression.   VIII: Hearing intact to finger rub bilaterally.   IX, X: Palate/Uvula elevates symmetrically.   XI: 5/5 Trapezius & SCM bilaterally.   XII: Tongue is midline.     Motor Exam: Strength 5/5 throughout and symmetric.    Sensation: Sensation to LT intact grossly through symmetrically. Romberg negative.    Cerebellar:   Finger to Nose: intact bilaterally with mild terminal dysmetria on FTN.    Reflexes: 2+  throughout, symmetric, with down-going toes. Negative Hoffman bilaterally.     Station/ Gait: Narrow based, short stepped.  Mild Shuffle.  Absent arm swing bilaterally.  Stooped.     Tone increased throughout but improved from prior, though fine motor use of both hands is still slowed to RAM, finger tapping and open/close, R>L.  Foot tapping also reduced in this laterality.     Assessment:     76 y/o M with hx HTN, HLD, borderline DM, glaucoma, prior CVA, presents to Movement Disorders clinic for evaluation of Parkinson's Disease. By history, exam as above, positive DATscan reported, this does fit with a diagnosis of Parkinson's Disease, but there is significant overlay from his prior stroke. It is unclear what symptoms are CVA related and which are PD related.  To that end, he is applying secondary stroke prevention agents, so we will try to improve his Parkinson's medication delivery to see what improves.     We discussed Parkinson's at length, including the motor and non-motor symptoms, prognosis, possibilities for progression, treatment options, and expectations. The goal for long-term functional stability is to achieve predictability, consistency, and smoothness of medication delivery.  We also discussed the need to stay physically and mentally active.     Initially showed solid response to change to Rytary, but change has waned.  Disease and functionality has progressed. To counter, we'll change to 2x 145mg  3x daily of Rytary, with the thought that if things worsen, we can try a lower dose at 145mg  TID to see if his change has been a peak-dose effect. Discussed dosing, treatment expectations and s/e of which to be aware. Answered all questions regarding this medication.    He is also to begin BIG PT, followed by LOUD SLT after his bladder procedure.  Referral given.     Plan:     1) Parkinson's Disease - severe, chronic progressing condition.  - Change Rytary to 2 capsules 145mg  TID.     2) Gait disorder -      - Begin BIG PT and OT.     3) Dysarthria -   - Begin LOUD SLT after PT.    4) Prior CVA - continue Plavix and statin for secondary stroke prevention.  - Loop recorder implanted currently.     RTC in 2 mo. Patient can follow up sooner if needed. In the meantime, patient will contact the office with any questions or concerns.     -----------------------------------------------------------    Elenore Paddy, MD  Co-Director  Movement Disorders Specialist  [facility]  [facility]    [address]  [address]  [place] [address]    [place], [place] [address]  T [phone]  F [phone]   T [phone]  F [phone]     [email or web address]

## 2017-04-19 LAB — CUP PACEART REMOTE DEVICE CHECK
Date Time Interrogation Session: 20180520203540
Implantable Pulse Generator Implant Date: 20170922

## 2017-04-27 ENCOUNTER — Encounter (HOSPITAL_BASED_OUTPATIENT_CLINIC_OR_DEPARTMENT_OTHER): Payer: Self-pay | Admitting: *Deleted

## 2017-04-30 ENCOUNTER — Encounter (HOSPITAL_BASED_OUTPATIENT_CLINIC_OR_DEPARTMENT_OTHER): Payer: Self-pay | Admitting: *Deleted

## 2017-04-30 NOTE — Progress Notes (Signed)
SPOKE W/ PT'S WIFE AND PT.  NPO AFTER MN W/ EXCEPTION CLEAR LIQUIDS UNTIL 0800 (NO CREAM/ MILK PRODUCTS).  ARRIVE AT 9211.  NEEDS ISTAT.  CURRENT EKG IN CHART AND EPIC.  PT DOES NOT USE CANE/ WALKER BUT DOES USE WHEELCHAIR WITH LONG DISTANCE, PT HAS GAIT DISTURBANCE POST STROKE.  PT ALSO HAS SPEECH IMPAIRMENT POST STROKE.  SUGGEST HAVING WIFE IN PRE-OP SHE UNDERSTANDS HIM WELL.

## 2017-05-02 NOTE — Progress Notes (Signed)
Chart reviewed by Dr Floria Raveling to proceed at  [facility].

## 2017-05-07 ENCOUNTER — Ambulatory Visit (HOSPITAL_BASED_OUTPATIENT_CLINIC_OR_DEPARTMENT_OTHER): Payer: Medicare HMO | Admitting: Anesthesiology

## 2017-05-07 ENCOUNTER — Observation Stay (HOSPITAL_BASED_OUTPATIENT_CLINIC_OR_DEPARTMENT_OTHER)
Admission: RE | Admit: 2017-05-07 | Discharge: 2017-05-09 | Disposition: A | Discharge status: Home / Self Care | Payer: Medicare HMO | Source: Ambulatory Visit | Attending: Urology | Admitting: Urology

## 2017-05-07 ENCOUNTER — Encounter (HOSPITAL_COMMUNITY)
Admission: RE | Disposition: A | Discharge status: Home / Self Care | Payer: Self-pay | Source: Ambulatory Visit | Attending: Urology

## 2017-05-07 ENCOUNTER — Encounter (HOSPITAL_BASED_OUTPATIENT_CLINIC_OR_DEPARTMENT_OTHER): Payer: Self-pay | Admitting: Anesthesiology

## 2017-05-07 DIAGNOSIS — I6523 Occlusion and stenosis of bilateral carotid arteries: Secondary | ICD-10-CM | POA: Insufficient documentation

## 2017-05-07 DIAGNOSIS — D494 Neoplasm of unspecified behavior of bladder: Secondary | ICD-10-CM | POA: Diagnosis not present

## 2017-05-07 DIAGNOSIS — E119 Type 2 diabetes mellitus without complications: Secondary | ICD-10-CM | POA: Diagnosis not present

## 2017-05-07 DIAGNOSIS — R479 Unspecified speech disturbances: Secondary | ICD-10-CM | POA: Diagnosis not present

## 2017-05-07 DIAGNOSIS — Z79899 Other long term (current) drug therapy: Secondary | ICD-10-CM | POA: Diagnosis not present

## 2017-05-07 DIAGNOSIS — C672 Malignant neoplasm of lateral wall of bladder: Secondary | ICD-10-CM | POA: Diagnosis not present

## 2017-05-07 DIAGNOSIS — Z9841 Cataract extraction status, right eye: Secondary | ICD-10-CM | POA: Diagnosis not present

## 2017-05-07 DIAGNOSIS — R269 Unspecified abnormalities of gait and mobility: Secondary | ICD-10-CM | POA: Insufficient documentation

## 2017-05-07 DIAGNOSIS — Z888 Allergy status to other drugs, medicaments and biological substances status: Secondary | ICD-10-CM | POA: Diagnosis not present

## 2017-05-07 DIAGNOSIS — M1990 Unspecified osteoarthritis, unspecified site: Secondary | ICD-10-CM | POA: Insufficient documentation

## 2017-05-07 DIAGNOSIS — Z7901 Long term (current) use of anticoagulants: Secondary | ICD-10-CM | POA: Diagnosis not present

## 2017-05-07 DIAGNOSIS — C671 Malignant neoplasm of dome of bladder: Secondary | ICD-10-CM | POA: Diagnosis not present

## 2017-05-07 DIAGNOSIS — C679 Malignant neoplasm of bladder, unspecified: Secondary | ICD-10-CM | POA: Diagnosis not present

## 2017-05-07 DIAGNOSIS — E785 Hyperlipidemia, unspecified: Secondary | ICD-10-CM | POA: Insufficient documentation

## 2017-05-07 DIAGNOSIS — R310 Gross hematuria: Secondary | ICD-10-CM | POA: Diagnosis not present

## 2017-05-07 DIAGNOSIS — I739 Peripheral vascular disease, unspecified: Secondary | ICD-10-CM | POA: Diagnosis not present

## 2017-05-07 DIAGNOSIS — Z881 Allergy status to other antibiotic agents status: Secondary | ICD-10-CM | POA: Insufficient documentation

## 2017-05-07 DIAGNOSIS — G20 Parkinson's disease: Secondary | ICD-10-CM | POA: Insufficient documentation

## 2017-05-07 DIAGNOSIS — I69398 Other sequelae of cerebral infarction: Secondary | ICD-10-CM | POA: Diagnosis not present

## 2017-05-07 DIAGNOSIS — Z882 Allergy status to sulfonamides status: Secondary | ICD-10-CM | POA: Insufficient documentation

## 2017-05-07 DIAGNOSIS — N400 Enlarged prostate without lower urinary tract symptoms: Secondary | ICD-10-CM | POA: Diagnosis not present

## 2017-05-07 DIAGNOSIS — I10 Essential (primary) hypertension: Secondary | ICD-10-CM | POA: Diagnosis not present

## 2017-05-07 DIAGNOSIS — Z8711 Personal history of peptic ulcer disease: Secondary | ICD-10-CM | POA: Insufficient documentation

## 2017-05-07 DIAGNOSIS — Z823 Family history of stroke: Secondary | ICD-10-CM | POA: Insufficient documentation

## 2017-05-07 DIAGNOSIS — H4089 Other specified glaucoma: Secondary | ICD-10-CM | POA: Diagnosis not present

## 2017-05-07 HISTORY — DX: Long term (current) use of anticoagulants: Z79.01

## 2017-05-07 HISTORY — DX: Personal history of peptic ulcer disease: Z87.11

## 2017-05-07 HISTORY — PX: TRANSURETHRAL RESECTION OF BLADDER TUMOR: SHX2575

## 2017-05-07 HISTORY — DX: Hyperlipidemia, unspecified: E78.5

## 2017-05-07 HISTORY — DX: Benign prostatic hyperplasia with lower urinary tract symptoms: N40.1

## 2017-05-07 HISTORY — DX: Unspecified speech disturbances: R47.9

## 2017-05-07 HISTORY — DX: Unspecified glaucoma: H40.9

## 2017-05-07 HISTORY — DX: Pseudobulbar affect: F48.2

## 2017-05-07 HISTORY — DX: Other sequelae of cerebral infarction: I69.398

## 2017-05-07 HISTORY — DX: Male erectile dysfunction, unspecified: N52.9

## 2017-05-07 HISTORY — DX: Presence of other cardiac implants and grafts: Z95.818

## 2017-05-07 HISTORY — PX: CYSTOSCOPY W/ URETERAL STENT PLACEMENT: SHX1429

## 2017-05-07 HISTORY — DX: Prediabetes: R73.03

## 2017-05-07 HISTORY — DX: Unspecified sequelae of cerebral infarction: I69.30

## 2017-05-07 HISTORY — DX: Unspecified abnormalities of gait and mobility: R26.9

## 2017-05-07 HISTORY — DX: Presence of spectacles and contact lenses: Z97.3

## 2017-05-07 HISTORY — DX: Occlusion and stenosis of bilateral carotid arteries: I65.23

## 2017-05-07 HISTORY — DX: Atrial septal defect: Q21.1

## 2017-05-07 HISTORY — DX: Patent foramen ovale: Q21.12

## 2017-05-07 HISTORY — DX: Neoplasm of unspecified behavior of bladder: D49.4

## 2017-05-07 HISTORY — DX: Personal history of traumatic brain injury: Z87.820

## 2017-05-07 LAB — POCT I-STAT 4, (NA,K, GLUC, HGB,HCT)
Glucose, Bld: 97 mg/dL (ref 65–99)
HCT: 45 % (ref 39.0–52.0)
Hemoglobin: 15.3 g/dL (ref 13.0–17.0)
Potassium: 4.3 mmol/L (ref 3.5–5.1)
Sodium: 141 mmol/L (ref 135–145)

## 2017-05-07 LAB — BASIC METABOLIC PANEL
Anion gap: 8 (ref 5–15)
BUN: 16 mg/dL (ref 6–20)
CO2: 25 mmol/L (ref 22–32)
Calcium: 9.3 mg/dL (ref 8.9–10.3)
Chloride: 104 mmol/L (ref 101–111)
Creatinine, Ser: 1.17 mg/dL (ref 0.61–1.24)
GFR calc Af Amer: >60 mL/min (ref >60)
GFR calc non Af Amer: 59 mL/min — ABNORMAL LOW (ref >60)
Glucose, Bld: 119 mg/dL — ABNORMAL HIGH (ref 65–99)
Potassium: 4.4 mmol/L (ref 3.5–5.1)
Sodium: 137 mmol/L (ref 135–145)

## 2017-05-07 LAB — CBC
HCT: 44 % (ref 39.0–52.0)
Hemoglobin: 14.5 g/dL (ref 13.0–17.0)
MCH: 26.4 pg (ref 26.0–34.0)
MCHC: 33 g/dL (ref 30.0–36.0)
MCV: 80.1 fL (ref 78.0–100.0)
Platelets: 180 10*3/uL (ref 150–400)
RBC: 5.49 MIL/uL (ref 4.22–5.81)
RDW: 13.5 % (ref 11.5–15.5)
WBC: 8.1 10*3/uL (ref 4.0–10.5)

## 2017-05-07 SURGERY — TURBT (TRANSURETHRAL RESECTION OF BLADDER TUMOR)
Anesthesia: General

## 2017-05-07 MED ORDER — CARBIDOPA-LEVODOPA ER 36.25-145 MG PO CPCR
2.0000 | ORAL_CAPSULE | Freq: Three times a day (TID) | ORAL | Status: DC
  Administered 2017-05-08 (×2): 2 via ORAL

## 2017-05-07 MED ORDER — DIPHENHYDRAMINE HCL 50 MG/ML IJ SOLN: 12.5000 mg | Freq: Four times a day (QID) | INTRAMUSCULAR | Status: DC | PRN

## 2017-05-07 MED ORDER — ONDANSETRON HCL 4 MG/2ML IJ SOLN
INTRAMUSCULAR | Status: DC | PRN
  Administered 2017-05-07: 4 mg via INTRAVENOUS

## 2017-05-07 MED ORDER — LIDOCAINE 2% (20 MG/ML) 5 ML SYRINGE
INTRAMUSCULAR | Status: DC | PRN
Start: 2017-05-07 — End: 2017-05-07
  Administered 2017-05-07: 80 mg via INTRAVENOUS

## 2017-05-07 MED ORDER — BELLADONNA ALKALOIDS-OPIUM 16.2-60 MG RE SUPP
1.0000 | Freq: Four times a day (QID) | RECTAL | Status: DC | PRN
  Administered 2017-05-07 – 2017-05-08 (×2): 1 via RECTAL
  Filled 2017-05-07: qty 1

## 2017-05-07 MED ORDER — ZOLPIDEM TARTRATE 5 MG PO TABS: 5.0000 mg | ORAL_TABLET | Freq: Every evening | ORAL | Status: DC | PRN

## 2017-05-07 MED ORDER — LIDOCAINE 2% (20 MG/ML) 5 ML SYRINGE
INTRAMUSCULAR | Status: AC
Start: 2017-05-07 — End: 2017-05-07
  Filled 2017-05-07: qty 5

## 2017-05-07 MED ORDER — LIDOCAINE 2% (20 MG/ML) 5 ML SYRINGE
INTRAMUSCULAR | Status: AC
  Filled 2017-05-07: qty 5

## 2017-05-07 MED ORDER — CEFAZOLIN SODIUM-DEXTROSE 2-4 GM/100ML-% IV SOLN
2.0000 g | INTRAVENOUS | Status: AC
  Administered 2017-05-07: 2 g via INTRAVENOUS
  Filled 2017-05-07: qty 100

## 2017-05-07 MED ORDER — ONDANSETRON HCL 4 MG/2ML IJ SOLN: 4.0000 mg | INTRAMUSCULAR | Status: DC | PRN

## 2017-05-07 MED ORDER — DIPHENHYDRAMINE HCL 12.5 MG/5ML PO ELIX: 12.5000 mg | ORAL_SOLUTION | Freq: Four times a day (QID) | ORAL | Status: DC | PRN

## 2017-05-07 MED ORDER — FENTANYL CITRATE (PF) 100 MCG/2ML IJ SOLN
INTRAMUSCULAR | Status: AC
  Filled 2017-05-07: qty 2

## 2017-05-07 MED ORDER — EPHEDRINE 5 MG/ML INJ
INTRAVENOUS | Status: AC
  Filled 2017-05-07: qty 10

## 2017-05-07 MED ORDER — OXYCODONE HCL 5 MG/5ML PO SOLN
5.0000 mg | Freq: Once | ORAL | Status: DC | PRN
  Filled 2017-05-07: qty 5

## 2017-05-07 MED ORDER — DEXAMETHASONE SODIUM PHOSPHATE 4 MG/ML IJ SOLN
INTRAMUSCULAR | Status: DC | PRN
  Administered 2017-05-07: 10 mg via INTRAVENOUS

## 2017-05-07 MED ORDER — LACTATED RINGERS IV SOLN
INTRAVENOUS | Status: DC
  Administered 2017-05-07 (×3): via INTRAVENOUS
  Filled 2017-05-07: qty 1000

## 2017-05-07 MED ORDER — ATORVASTATIN CALCIUM 40 MG PO TABS
40.0000 mg | ORAL_TABLET | Freq: Every evening | ORAL | Status: DC
  Administered 2017-05-07 – 2017-05-08 (×2): 40 mg via ORAL
  Filled 2017-05-07 (×2): qty 1

## 2017-05-07 MED ORDER — HYDROCODONE-ACETAMINOPHEN 5-325 MG PO TABS
1.0000 | ORAL_TABLET | ORAL | Status: DC | PRN
  Administered 2017-05-08: 1 via ORAL
  Administered 2017-05-08 – 2017-05-09 (×3): 2 via ORAL
  Filled 2017-05-07 (×3): qty 2
  Filled 2017-05-07: qty 1

## 2017-05-07 MED ORDER — PROMETHAZINE HCL 25 MG/ML IJ SOLN
6.2500 mg | INTRAMUSCULAR | Status: DC | PRN
  Filled 2017-05-07: qty 1

## 2017-05-07 MED ORDER — SUCCINYLCHOLINE CHLORIDE 200 MG/10ML IV SOSY
PREFILLED_SYRINGE | INTRAVENOUS | Status: AC
  Filled 2017-05-07: qty 10

## 2017-05-07 MED ORDER — CEFAZOLIN SODIUM-DEXTROSE 2-4 GM/100ML-% IV SOLN
INTRAVENOUS | Status: AC
  Filled 2017-05-07: qty 100

## 2017-05-07 MED ORDER — DEXAMETHASONE SODIUM PHOSPHATE 10 MG/ML IJ SOLN
INTRAMUSCULAR | Status: AC
  Filled 2017-05-07: qty 1

## 2017-05-07 MED ORDER — HYDROMORPHONE HCL 1 MG/ML IJ SOLN
0.5000 mg | INTRAMUSCULAR | Status: DC | PRN
  Administered 2017-05-07 – 2017-05-08 (×3): 1 mg via INTRAVENOUS
  Filled 2017-05-07 (×4): qty 1

## 2017-05-07 MED ORDER — ONDANSETRON HCL 4 MG/2ML IJ SOLN
INTRAMUSCULAR | Status: AC
  Filled 2017-05-07: qty 2

## 2017-05-07 MED ORDER — PROPOFOL 10 MG/ML IV BOLUS
INTRAVENOUS | Status: DC | PRN
  Administered 2017-05-07: 140 mg via INTRAVENOUS

## 2017-05-07 MED ORDER — LATANOPROST 0.005 % OP SOLN
1.0000 [drp] | Freq: Every day | OPHTHALMIC | Status: DC
  Administered 2017-05-07 – 2017-05-08 (×2): 1 [drp] via OPHTHALMIC
  Filled 2017-05-07: qty 2.5

## 2017-05-07 MED ORDER — EPHEDRINE SULFATE-NACL 50-0.9 MG/10ML-% IV SOSY
PREFILLED_SYRINGE | INTRAVENOUS | Status: DC | PRN
Start: 2017-05-07 — End: 2017-05-07
  Administered 2017-05-07: 15 mg via INTRAVENOUS

## 2017-05-07 MED ORDER — SODIUM CHLORIDE 0.9 % IV SOLN
INTRAVENOUS | Status: DC
  Administered 2017-05-07 – 2017-05-08 (×2): via INTRAVENOUS

## 2017-05-07 MED ORDER — FENTANYL CITRATE (PF) 100 MCG/2ML IJ SOLN
INTRAMUSCULAR | Status: DC | PRN
  Administered 2017-05-07 (×2): 50 ug via INTRAVENOUS

## 2017-05-07 MED ORDER — HYDROMORPHONE HCL 1 MG/ML IJ SOLN
0.2500 mg | INTRAMUSCULAR | Status: DC | PRN
  Filled 2017-05-07: qty 0.5

## 2017-05-07 MED ORDER — PROPOFOL 10 MG/ML IV BOLUS
INTRAVENOUS | Status: AC
  Filled 2017-05-07: qty 20

## 2017-05-07 MED ORDER — ACETAMINOPHEN 325 MG PO TABS: 650.0000 mg | ORAL_TABLET | ORAL | Status: DC | PRN

## 2017-05-07 MED ORDER — OXYCODONE HCL 5 MG PO TABS
5.0000 mg | ORAL_TABLET | Freq: Once | ORAL | Status: DC | PRN
  Filled 2017-05-07: qty 1

## 2017-05-07 MED ORDER — SUCCINYLCHOLINE CHLORIDE 20 MG/ML IJ SOLN
INTRAMUSCULAR | Status: DC | PRN
Start: 2017-05-07 — End: 2017-05-07
  Administered 2017-05-07: 120 mg via INTRAVENOUS

## 2017-05-07 MED ORDER — METOPROLOL SUCCINATE ER 100 MG PO TB24
100.0000 mg | ORAL_TABLET | Freq: Every day | ORAL | Status: DC
  Administered 2017-05-07 – 2017-05-08 (×2): 100 mg via ORAL
  Filled 2017-05-07 (×2): qty 1

## 2017-05-07 SURGICAL SUPPLY — 24 items
BAG DRAIN URO-CYSTO SKYTR STRL (DRAIN) ×6 IMPLANT
BAG URINE DRAINAGE (UROLOGICAL SUPPLIES) IMPLANT
BAG URINE LEG 19OZ MD ST LTX (BAG) IMPLANT
CATH FOLEY 3WAY 30CC 22F (CATHETERS) ×3 IMPLANT
CATH INTERMIT  6FR 70CM (CATHETERS) IMPLANT
CLOTH BEACON ORANGE TIMEOUT ST (SAFETY) ×3 IMPLANT
ELECT REM PT RETURN 9FT ADLT (ELECTROSURGICAL) ×3
ELECTRODE REM PT RTRN 9FT ADLT (ELECTROSURGICAL) ×2 IMPLANT
EVACUATOR MICROVAS BLADDER (UROLOGICAL SUPPLIES) IMPLANT
GLOVE BIO SURGEON STRL SZ8 (GLOVE) ×3 IMPLANT
GOWN STRL REUS W/TWL LRG LVL3 (GOWN DISPOSABLE) ×3 IMPLANT
GOWN STRL REUS W/TWL XL LVL3 (GOWN DISPOSABLE) ×3 IMPLANT
GUIDEWIRE ANG ZIPWIRE 038X150 (WIRE) ×3 IMPLANT
GUIDEWIRE STR DUAL SENSOR (WIRE) IMPLANT
IV NS IRRIG 3000ML ARTHROMATIC (IV SOLUTION) ×3 IMPLANT
KIT RM TURNOVER CYSTO AR (KITS) ×3 IMPLANT
LOOP CUT BIPOLAR 24F LRG (ELECTROSURGICAL) ×3 IMPLANT
MANIFOLD NEPTUNE II (INSTRUMENTS) ×3 IMPLANT
PACK CYSTO (CUSTOM PROCEDURE TRAY) ×3 IMPLANT
PLUG CATH AND CAP STER (CATHETERS) ×3 IMPLANT
SYR 30ML LL (SYRINGE) ×3 IMPLANT
SYRINGE 10CC LL (SYRINGE) ×3 IMPLANT
SYRINGE IRR TOOMEY STRL 70CC (SYRINGE) ×3 IMPLANT
TUBE CONNECTING 12X1/4 (SUCTIONS) ×3 IMPLANT

## 2017-05-07 NOTE — Transfer of Care (Signed)
Immediate Anesthesia Transfer of Care Note  Patient: Mike Nixon  Procedure(s) Performed: Procedure(s): TRANSURETHRAL RESECTION OF BLADDER TUMOR (TURBT) (N/A) CYSTOSCOPY WITH RETROGRADE PYELOGRAM/URETERAL POSSIBLE STENT PLACEMENT (Bilateral)  Patient Location: PACU  Anesthesia Type:General  Level of Consciousness: awake, alert , oriented and patient cooperative  Airway & Oxygen Therapy: Patient Spontanous Breathing and Patient connected to nasal cannula oxygen  Post-op Assessment: Report given to RN and Post -op Vital signs reviewed and stable  Post vital signs: Reviewed and stable  Last Vitals:  Vitals:   05/07/17 1242  BP: (!) 169/92  Pulse: 60  Resp: 16  Temp: 36.7 C    Last Pain:  Vitals:   05/07/17 1242  TempSrc: Oral      Patients Stated Pain Goal: 6 (67/28/97 9150)  Complications: No apparent anesthesia complications

## 2017-05-07 NOTE — Op Note (Signed)
.  Preoperative diagnosis: bladder tumor  Postoperative diagnosis: Same  Procedure: 1 cystoscopy 2. bilateral retrograde pyelography 3.  Intraoperative fluoroscopy, under one hour, with interpretation 4.  Clot evacuation 5. 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  Ureteral orifices in normal anatomic location. No hydronephrosis or filling defects in either collecting system  Indications: Patient is a 76 year old male with a history of bladder tumor and gross hematuria.  After discussing treatment options, they decided proceed with transurethral resection of a bladder tumor.  Procedure her in detail: The patient was brought to the operating room and a brief timeout was done to ensure correct patient, correct procedure, correct site.  General anesthesia was administered patient was placed in dorsal lithotomy position.  Their genitalia was then prepped and draped in usual sterile fashion.  A rigid 26 French cystoscope was passed in the urethra and the bladder.  Bladder was inspected and we noted a 7cm bladder tumor.  the ureteral orifices were in the normal orthotopic locations.  a 6 french ureteral catheter was then instilled into the left ureteral orifice.  a gentle retrograde was obtained and findings noted above. We then turned our attention to the right side. a 6 french ureteral catheter was then instilled into the right ureteral orifice.  a gentle retrograde was obtained and findings noted above. We then removed the cystoscope and placed a resectoscope into the bladder.  Using the bipolar resectoscope we removed the bladder tumor down to the base. A subsequent muscle deep biopsy was then taken. Hemostasis was then obtained with electrocautery. We then removed the bladder  tumor chips and sent them for pathology. We then re-inspected the bladder and found no residula bleeding.  the bladder was then drained, a 22 French foley was placed and this concluded the procedure which was well tolerated by patient.  Complications: None  Condition: Stable, extubated, transferred to PACU  Plan: Patient is admitted overnight with continuous bladder irrigation. If their urine is clear tomorrow they will be discharged home and followup in 5 days for foley catheter removal and pathology discussion.

## 2017-05-07 NOTE — H&P (Signed)
Urology Admission H&P  Chief Complaint: gross hematuria  History of Present Illness: Mr Campas is a 76yo with a hx of bladder tumor found during hematuria workup  Past Medical History:  Diagnosis Date  . Anticoagulant long-term use    PLAVIX  . Benign localized prostatic hyperplasia with lower urinary tract symptoms (LUTS)   . Bladder tumor   . Borderline diabetes    watches diet  . Complication of anesthesia    HARD TO WAKE  . ED (erectile dysfunction) of organic origin   . Gait disturbance, post-stroke    uses wheelchair long distance  . Glaucoma, right eye   . History of bleeding peptic ulcer    03/ 1979  s/p  surgery  . History of concussion    1987 no loc-- no residual  . History of CVA with residual deficit    05/ 2016 CYPTOGENIC CVA --- RESIDUAL GAIT DISTURBANCE AND SIALORRHEA ACCESS (DROOLING)  . Hyperlipidemia   . Hypertension   . Mild atherosclerosis of both carotid arteries    per duplex from outside facitlity 04-08-2015 less then 50% stenosis bilateral ICA  . Parkinson's disease [facility]) neurologist -- dr r. Mitzi Hansen falconer ([facility], [facility])   dx 2015  . PBA (pseudobulbar affect)    laughter-- residual from cva 05/ 2016  . PFO (patent foramen ovale)    per TEE 08-18-2016  . Speech impairment    post-stroke  . Status post placement of implantable loop recorder 08/18/2016   placed by dr allred  . Wears glasses    Past Surgical History:  Procedure Laterality Date  . APPENDECTOMY  1978  . CATARACT EXTRACTION W/ INTRAOCULAR LENS IMPLANT Right 2014 approx.  . EP IMPLANTABLE DEVICE N/A 08/18/2016   Procedure: Loop Recorder Insertion;  Surgeon: Thompson Grayer, MD;  Location: [facility];  Service: Cardiovascular;  Laterality: N/A;  . REMOVAL NODULES BILATERAL FLANK  2008 approx.   per pt benign fatty tissue  . REPAIR OF PERFORATED ULCER  01/1978   bleeding peptic ulcer  . TEE WITHOUT CARDIOVERSION N/A 08/18/2016   Procedure:  TRANSESOPHAGEAL ECHOCARDIOGRAM (TEE);  Surgeon: Sueanne Margarita, MD;  Location: [facility] ENDOSCOPY;  Service: Cardiovascular;  Laterality: N/A; no evidence thrombus;  mobile atrial septum w/ positive bubble study consistent with PFO/ trivial AR, mild TR  . TRANSTHORACIC ECHOCARDIOGRAM  04/09/2015   ef 40-81%, grade 1 diastolic dysfunction/  mild AV sclerosis without stenosis/  mild TR,  trivial MR and PR  . TRANSURETHRAL RESECTION OF PROSTATE  08-20-2013   at [facility] Medications:  Prescriptions Prior to Admission  Medication Sig Dispense Refill Last Dose  . atorvastatin (LIPITOR) 40 MG tablet Take 40 mg by mouth every evening.     . Carbidopa-Levodopa ER (RYTARY) 36.25-145 MG CPCR Take 2 tablets by mouth 3 (three) times daily.     . clopidogrel (PLAVIX) 75 MG tablet Take 75 mg by mouth every morning.    04/26/2017  . meclizine (ANTIVERT) 25 MG tablet Take 25 mg by mouth 3 (three) times daily as needed for dizziness.   Taking  . metoprolol succinate (TOPROL-XL) 100 MG 24 hr tablet Take 100 mg by mouth every evening.    Taking  . ondansetron (ZOFRAN) 4 MG tablet Take 1 tablet (4 mg total) by mouth daily. 30 tablet 2   . Travoprost, BAK Free, (TRAVATAN) 0.004 % SOLN ophthalmic solution Place 1 drop into the right eye at bedtime.  Allergies:  Allergies  Allergen Reactions  . Benazepril Other (See Comments)    Pt not sure of reaction. ?cough  . Doxycycline     Unknown  . Levofloxacin Hives and Swelling  . Simvastatin Other (See Comments)    Pt does not remember what the reaction was. ?muscle aches  . Sulfamethoxazole-Trimethoprim Hives and Swelling  . Tamsulosin Hives and Swelling  . Tramadol Hcl Hives and Swelling  . Sulfa Antibiotics Hives, Swelling and Rash    Severe rash    Family History  Problem Relation Age of Onset  . Stroke Sister    Social History:  reports that he has never smoked. He has never used smokeless tobacco. He reports that he does not drink alcohol or use  drugs.  Review of Systems  Genitourinary: Positive for hematuria.  All other systems reviewed and are negative.   Physical Exam:  Vital signs in last 24 hours: Temp:  [98 F (36.7 C)] 98 F (36.7 C) (06/11 1242) Pulse Rate:  [60] 60 (06/11 1242) Resp:  [16] 16 (06/11 1242) BP: (169)/(92) 169/92 (06/11 1242) SpO2:  [100 %] 100 % (06/11 1242) Weight:  [70.3 kg (155 lb)] 70.3 kg (155 lb) (06/11 1242) Physical Exam  Constitutional: He is oriented to person, place, and time. He appears well-developed and well-nourished.  HENT:  Head: Normocephalic and atraumatic.  Eyes: EOM are normal. Pupils are equal, round, and reactive to light.  Neck: Normal range of motion. No thyromegaly present.  Cardiovascular: Normal rate and regular rhythm.   Respiratory: Effort normal. No respiratory distress.  GI: Soft. He exhibits no distension.  Musculoskeletal: Normal range of motion. He exhibits no edema.  Neurological: He is alert and oriented to person, place, and time.  Skin: Skin is warm and dry.  Psychiatric: He has a normal mood and affect. His behavior is normal. Judgment and thought content normal.    Laboratory Data:  No results found for this or any previous visit (from the past 24 hour(s)). No results found for this or any previous visit (from the past 240 hour(s)). Creatinine: No results for input(s): CREATININE in the last 168 hours. Baseline Creatinine: unknwon  Impression/Assessment:  76yo with a bladder tumor  Plan:  The risks/benefits/alternatives to TURBT was explained to the patient and he understands and wishes to proceed with surgery  Nicolette Bang 05/07/2017, 1:13 PM

## 2017-05-07 NOTE — Anesthesia Procedure Notes (Signed)
Date/Time: 05/07/2017 2:05 PM Performed by: Wanita Chamberlain

## 2017-05-07 NOTE — Anesthesia Procedure Notes (Signed)
Procedure Name: Intubation Date/Time: 05/07/2017 1:50 PM Performed by: Wanita Chamberlain Pre-anesthesia Checklist: Patient identified, Timeout performed, Emergency Drugs available, Suction available and Patient being monitored Patient Re-evaluated:Patient Re-evaluated prior to inductionOxygen Delivery Method: Circle system utilized Preoxygenation: Pre-oxygenation with 100% oxygen Intubation Type: IV induction Ventilation: Mask ventilation without difficulty Laryngoscope Size: Mac and 4 Grade View: Grade I Tube type: Oral Number of attempts: 1 Airway Equipment and Method: Stylet Placement Confirmation: ETT inserted through vocal cords under direct vision,  breath sounds checked- equal and bilateral and positive ETCO2 Secured at: 23 cm Tube secured with: Tape Dental Injury: Teeth and Oropharynx as per pre-operative assessment

## 2017-05-07 NOTE — Anesthesia Postprocedure Evaluation (Signed)
Anesthesia Post Note  Patient: Mike Nixon  Procedure(s) Performed: Procedure(s) (LRB): TRANSURETHRAL RESECTION OF BLADDER TUMOR (TURBT) (N/A) CYSTOSCOPY WITH RETROGRADE PYELOGRAM/URETERAL POSSIBLE STENT PLACEMENT (Bilateral)     Patient location during evaluation: PACU Anesthesia Type: General Level of consciousness: sedated Pain management: pain level controlled Vital Signs Assessment: post-procedure vital signs reviewed and stable Respiratory status: spontaneous breathing and respiratory function stable Cardiovascular status: stable Anesthetic complications: no    Last Vitals:  Vitals:   05/07/17 1545 05/07/17 1600  BP: (!) 146/88 (!) 147/89  Pulse: 66 65  Resp: 19 (!) 22  Temp:  36.1 C    Last Pain:  Vitals:   05/07/17 1530  TempSrc:   PainSc: 0-No pain                 Daymian Lill DANIEL

## 2017-05-07 NOTE — Anesthesia Preprocedure Evaluation (Addendum)
Anesthesia Evaluation    Reviewed: reviewed documented beta blocker date and time   History of Anesthesia Complications (+) PROLONGED EMERGENCE  Airway Mallampati: II  TM Distance: >3 FB Neck ROM: Full    Dental no notable dental hx.    Pulmonary    Pulmonary exam normal breath sounds clear to auscultation       Cardiovascular hypertension, Pt. on medications and Pt. on home beta blockers + Peripheral Vascular Disease  Normal cardiovascular exam Rhythm:Regular Rate:Normal     Neuro/Psych    GI/Hepatic Neg liver ROS,   Endo/Other  negative endocrine ROS  Renal/GU Bladder Tumor     Musculoskeletal  (+) Arthritis , Osteoarthritis,    Abdominal   Peds  Hematology negative hematology ROS (+)   Anesthesia Other Findings   Reproductive/Obstetrics                                                             Anesthesia Evaluation    History of Anesthesia Complications (+) PROLONGED EMERGENCE  Airway        Dental   Pulmonary           Cardiovascular hypertension, Pt. on medications and Pt. on home beta blockers + Peripheral Vascular Disease       Neuro/Psych    GI/Hepatic Neg liver ROS,   Endo/Other  negative endocrine ROS  Renal/GU Bladder Tumor     Musculoskeletal  (+) Arthritis , Osteoarthritis,    Abdominal   Peds  Hematology negative hematology ROS (+)   Anesthesia Other Findings   Reproductive/Obstetrics                             Anesthesia Physical Anesthesia Plan Anesthesia Quick Evaluation  Anesthesia Physical Anesthesia Plan  ASA: III  Anesthesia Plan: General   Post-op Pain Management:    Induction: Intravenous  PONV Risk Score and Plan: 4 or greater and Ondansetron, Dexamethasone, Propofol, Midazolam and Treatment may vary due to age or medical condition  Airway Management Planned:   Additional  Equipment:   Intra-op Plan:   Post-operative Plan:   Informed Consent: I have reviewed the patients History and Physical, chart, labs and discussed the procedure including the risks, benefits and alternatives for the proposed anesthesia with the patient or authorized representative who has indicated his/her understanding and acceptance.   Dental advisory given  Plan Discussed with: CRNA  Anesthesia Plan Comments:         Anesthesia Quick Evaluation

## 2017-05-08 DIAGNOSIS — C672 Malignant neoplasm of lateral wall of bladder: Secondary | ICD-10-CM | POA: Diagnosis not present

## 2017-05-08 LAB — CBC
HCT: 40.3 % (ref 39.0–52.0)
Hemoglobin: 13 g/dL (ref 13.0–17.0)
MCH: 25.6 pg — ABNORMAL LOW (ref 26.0–34.0)
MCHC: 32.3 g/dL (ref 30.0–36.0)
MCV: 79.5 fL (ref 78.0–100.0)
Platelets: 225 10*3/uL (ref 150–400)
RBC: 5.07 MIL/uL (ref 4.22–5.81)
RDW: 13.6 % (ref 11.5–15.5)
WBC: 9.2 10*3/uL (ref 4.0–10.5)

## 2017-05-08 LAB — BASIC METABOLIC PANEL
Anion gap: 9 (ref 5–15)
BUN: 16 mg/dL (ref 6–20)
CO2: 25 mmol/L (ref 22–32)
Calcium: 9.1 mg/dL (ref 8.9–10.3)
Chloride: 103 mmol/L (ref 101–111)
Creatinine, Ser: 1.17 mg/dL (ref 0.61–1.24)
GFR calc Af Amer: >60 mL/min (ref >60)
GFR calc non Af Amer: 59 mL/min — ABNORMAL LOW (ref >60)
Glucose, Bld: 160 mg/dL — ABNORMAL HIGH (ref 65–99)
Potassium: 4.7 mmol/L (ref 3.5–5.1)
Sodium: 137 mmol/L (ref 135–145)

## 2017-05-08 MED ORDER — MIRABEGRON ER 25 MG PO TB24
25.0000 mg | ORAL_TABLET | Freq: Every day | ORAL | Status: DC
  Administered 2017-05-08 – 2017-05-09 (×2): 25 mg via ORAL
  Filled 2017-05-08 (×2): qty 1

## 2017-05-08 MED ORDER — PHENAZOPYRIDINE HCL 100 MG PO TABS
100.0000 mg | ORAL_TABLET | Freq: Three times a day (TID) | ORAL | Status: DC
  Administered 2017-05-08 – 2017-05-09 (×3): 100 mg via ORAL
  Filled 2017-05-08 (×4): qty 1

## 2017-05-08 MED ORDER — HYOSCYAMINE SULFATE 0.125 MG SL SUBL
0.1250 mg | SUBLINGUAL_TABLET | SUBLINGUAL | Status: DC | PRN
  Administered 2017-05-09: 0.125 mg via ORAL
  Filled 2017-05-08 (×2): qty 1

## 2017-05-09 DIAGNOSIS — C672 Malignant neoplasm of lateral wall of bladder: Secondary | ICD-10-CM | POA: Diagnosis not present

## 2017-05-09 MED ORDER — MIRABEGRON ER 25 MG PO TB24: 25.0000 mg | ORAL_TABLET | Freq: Every day | ORAL | 0 refills | Status: DC

## 2017-05-09 MED ORDER — HYDROCODONE-ACETAMINOPHEN 5-325 MG PO TABS: 1.0000 | ORAL_TABLET | ORAL | 0 refills | Status: DC | PRN

## 2017-05-09 MED ORDER — HYOSCYAMINE SULFATE 0.125 MG SL SUBL: 0.1250 mg | SUBLINGUAL_TABLET | SUBLINGUAL | 0 refills | Status: DC | PRN

## 2017-05-09 MED ORDER — PHENAZOPYRIDINE HCL 100 MG PO TABS: 100.0000 mg | ORAL_TABLET | Freq: Three times a day (TID) | ORAL | 0 refills | Status: DC | PRN

## 2017-05-09 NOTE — Progress Notes (Signed)
Reviewed discharge information with patient and caregiver. Answered all questions. Patient/caregiver able to teach back medications and reasons to contact MD/911. Patient verbalizes importance of PCP follow up appointment. Patient and family demonstrated proper foley care and switching between leg and standard drainage bag.  Mike Nixon. Brigitte Pulse, RN

## 2017-05-09 NOTE — Discharge Instructions (Signed)
Indwelling Urinary Catheter Care, Adult  Take good care of your catheter to keep it working and to prevent problems.  How to wear your catheter  Attach your catheter to your leg with tape (adhesive tape) or a leg strap. Make sure it is not too tight. If you use tape, remove any bits of tape that are already on the catheter.  How to wear a drainage bag  You should have:   A large overnight bag.   A small leg bag.    Overnight Bag  You may wear the overnight bag at any time. Always keep the bag below the level of your bladder but off the floor. When you sleep, put a clean plastic bag in a wastebasket. Then hang the bag inside the wastebasket.  Leg Bag  Never wear the leg bag at night. Always wear the leg bag below your knee. Keep the leg bag secure with a leg strap or tape.  How to care for your skin   Clean the skin around the catheter at least once every day.   Shower every day. Do not take baths.   Put creams, lotions, or ointments on your genital area only as told by your doctor.   Do not use powders, sprays, or lotions on your genital area.  How to clean your catheter and your skin  1. Wash your hands with soap and water.  2. Wet a washcloth in warm water and gentle (mild) soap.  3. Use the washcloth to clean the skin where the catheter enters your body. Clean downward and wipe away from the catheter in small circles. Do not wipe toward the catheter.  4. Pat the area dry with a clean towel. Make sure to clean off all soap.  How to care for your drainage bags  Empty your drainage bag when it is ?- full or at least 2-3 times a day. Replace your drainage bag once a month or sooner if it starts to smell bad or look dirty. Do not clean your drainage bag unless told by your doctor.  Emptying a drainage bag    Supplies Needed   Rubbing alcohol.   Gauze pad or cotton ball.   Tape or a leg strap.    Steps  1. Wash your hands with soap and water.  2. Separate (detach) the bag from your leg.  3. Hold the bag over  the toilet or a clean container. Keep the bag below your hips and bladder. This stops pee (urine) from going back into the tube.  4. Open the pour spout at the bottom of the bag.  5. Empty the pee into the toilet or container. Do not let the pour spout touch any surface.  6. Put rubbing alcohol on a gauze pad or cotton ball.  7. Use the gauze pad or cotton ball to clean the pour spout.  8. Close the pour spout.  9. Attach the bag to your leg with tape or a leg strap.  10. Wash your hands.    Changing a drainage bag  Supplies Needed   Alcohol wipes.   A clean drainage bag.   Adhesive tape or a leg strap.    Steps  1. Wash your hands with soap and water.  2. Separate the dirty bag from your leg.  3. Pinch the rubber catheter with your fingers so that pee does not spill out.  4. Separate the catheter tube from the drainage tube where these tubes connect (at the   connection valve). Do not let the tubes touch any surface.  5. Clean the end of the catheter tube with an alcohol wipe. Use a different alcohol wipe to clean the end of the drainage tube.  6. Connect the catheter tube to the drainage tube of the clean bag.  7. Attach the new bag to the leg with adhesive tape or a leg strap.  8. Wash your hands.    How to prevent infection and other problems   Never pull on your catheter or try to remove it. Pulling can damage tissue in your body.   Always wash your hands before and after touching your catheter.   If a leg strap gets wet, replace it with a dry one.   Drink enough fluids to keep your pee clear or pale yellow, or as told by your doctor.   Do not let the drainage bag or tubing touch the floor.   Wear cotton underwear.   If you are male, wipe from front to back after you poop (have a bowel movement).   Check on the catheter often to make sure it works and the tubing is not twisted.  Get help if:   Your pee is cloudy.   Your pee smells unusually bad.   Your pee is not draining into the bag.   Your  tube gets clogged.   Your catheter starts to leak.   Your bladder feels full.  Get help right away if:   You have redness, swelling, or pain where the catheter enters your body.   You have fluid, pus, or a bad smell coming from the area where the catheter enters your body.   The area where the catheter enters your body feels warm.   You have a fever.   You have pain in your:  ? Stomach (abdomen).  ? Legs.  ? Lower back.  ? Bladder.   You see blood fill the catheter.   Your pee is pink or red.   You feel sick to your stomach (nauseous).   You throw up (vomit).   You have chills.   Your catheter gets pulled out.  This information is not intended to replace advice given to you by your health care provider. Make sure you discuss any questions you have with your health care provider.  Document Released: 03/10/2013 Document Revised: 10/11/2016 Document Reviewed: 04/28/2014  Elsevier Interactive Patient Education  2018 [employer]

## 2017-05-10 ENCOUNTER — Encounter (HOSPITAL_BASED_OUTPATIENT_CLINIC_OR_DEPARTMENT_OTHER): Payer: Self-pay | Admitting: Urology

## 2017-05-10 ENCOUNTER — Encounter (INDEPENDENT_AMBULATORY_CARE_PROVIDER_SITE_OTHER): Payer: Self-pay | Admitting: Neurology

## 2017-05-10 DIAGNOSIS — R338 Other retention of urine: Secondary | ICD-10-CM | POA: Diagnosis not present

## 2017-05-10 NOTE — Discharge Summary (Signed)
Physician Discharge Summary  Patient ID: Mike Nixon MRN: [id number] DOB/AGE: 75/06/1941 75 y.o.  Admit date: 05/07/2017 Discharge date: 05/09/2017 Admission Diagnoses: Bladder tumor Discharge Diagnoses:  Active Problems:   Bladder tumor   Discharged Condition: good  Hospital Course: The patient tolerated the procedure well and was transferred to the floor on IV pain meds, IV fluid. On POD#1 pt was started on clear liquid diet and they ambulated in the halls. On POD#2 the patient was transitioned to a regular diet, IVFs were discontinued, and the patient passed flatus. Prior to discharge the pt was tolerating a regular diet, pain was controlled on PO pain meds, they were ambulating without difficulty, and they had normal bowel function.   Consults: None  Significant Diagnostic Studies: none  Treatments: surgery: TURBT  Discharge Exam: Blood pressure 134/85, pulse (!) 52, temperature 98 F (36.7 C), temperature source Oral, resp. rate 18, height 5\' 7"  (1.702 m), weight 70.3 kg (155 lb), SpO2 97 %. General appearance: alert, cooperative and appears stated age Ears: normal TM's and external ear canals both ears Neck: no adenopathy, no carotid bruit, no JVD, supple, symmetrical, trachea midline and thyroid not enlarged, symmetric, no tenderness/mass/nodules Resp: clear to auscultation bilaterally Cardio: regular rate and rhythm, S1, S2 normal, no murmur, click, rub or gallop GI: soft, non-tender; bowel sounds normal; no masses,  no organomegaly Extremities: extremities normal, atraumatic, no cyanosis or edema Neurologic: Grossly normal  Disposition: 01-Home or Self Care  Discharge Instructions    Discharge patient    Complete by:  As directed    Discharge disposition:  01-Home or Self Care   Discharge patient date:  05/09/2017     Allergies as of 05/09/2017      Reactions   Benazepril Other (See Comments)   Pt not sure of reaction. ?cough   Doxycycline    Unknown    Levofloxacin Hives, Swelling   Simvastatin Other (See Comments)   Pt does not remember what the reaction was. ?muscle aches   Sulfamethoxazole-trimethoprim Hives, Swelling   Tamsulosin Hives, Swelling   Tramadol    Tramadol Hcl Hives, Swelling   Sulfa Antibiotics Hives, Swelling, Rash   Severe rash      Medication List    TAKE these medications   atorvastatin 40 MG tablet Commonly known as:  LIPITOR Take 40 mg by mouth every evening.   clopidogrel 75 MG tablet Commonly known as:  PLAVIX Take 75 mg by mouth every morning.   HYDROcodone-acetaminophen 5-325 MG tablet Commonly known as:  NORCO/VICODIN Take 1-2 tablets by mouth every 4 (four) hours as needed for moderate pain.   hyoscyamine 0.125 MG SL tablet Commonly known as:  LEVSIN SL Take 1 tablet (0.125 mg total) by mouth every 4 (four) hours as needed for cramping.   meclizine 25 MG tablet Commonly known as:  ANTIVERT Take 25 mg by mouth 3 (three) times daily as needed for dizziness.   metoprolol succinate 100 MG 24 hr tablet Commonly known as:  TOPROL-XL Take 100 mg by mouth every evening.   mirabegron ER 25 MG Tb24 tablet Commonly known as:  MYRBETRIQ Take 1 tablet (25 mg total) by mouth daily.   phenazopyridine 100 MG tablet Commonly known as:  PYRIDIUM Take 1 tablet (100 mg total) by mouth 3 (three) times daily as needed for pain.   RYTARY 36.25-145 MG Cpcr Generic drug:  Carbidopa-Levodopa ER Take 2 tablets by mouth 3 (three) times daily. What changed:  Another medication with the same name  was removed. Continue taking this medication, and follow the directions you see here.   Travoprost (BAK Free) 0.004 % Soln ophthalmic solution Commonly known as:  TRAVATAN Place 1 drop into the right eye at bedtime.      Follow-up Information    Ronald Londo, Candee Furbish, MD. Call on 05/15/2017.   Specialty:  Urology Why:  voiding trial Contact information: [place] [address] [phone]            Signed: Nicolette Bang 05/10/2017, 12:04 PM

## 2017-05-14 DIAGNOSIS — R3982 Chronic bladder pain: Secondary | ICD-10-CM | POA: Diagnosis not present

## 2017-05-14 DIAGNOSIS — R310 Gross hematuria: Secondary | ICD-10-CM | POA: Diagnosis not present

## 2017-05-15 ENCOUNTER — Ambulatory Visit (INDEPENDENT_AMBULATORY_CARE_PROVIDER_SITE_OTHER): Payer: Medicare HMO | Admitting: *Deleted

## 2017-05-15 ENCOUNTER — Encounter (INDEPENDENT_AMBULATORY_CARE_PROVIDER_SITE_OTHER): Payer: Self-pay | Admitting: Neurology

## 2017-05-15 DIAGNOSIS — I639 Cerebral infarction, unspecified: Secondary | ICD-10-CM

## 2017-05-17 DIAGNOSIS — C673 Malignant neoplasm of anterior wall of bladder: Secondary | ICD-10-CM | POA: Diagnosis not present

## 2017-05-17 NOTE — Progress Notes (Signed)
Carelink Summary Report / Loop Recorder 

## 2017-05-21 ENCOUNTER — Other Ambulatory Visit: Payer: Self-pay | Admitting: Urology

## 2017-05-23 LAB — CUP PACEART REMOTE DEVICE CHECK
Date Time Interrogation Session: 20180619204043
Implantable Pulse Generator Implant Date: 20170922

## 2017-05-28 ENCOUNTER — Encounter (INDEPENDENT_AMBULATORY_CARE_PROVIDER_SITE_OTHER): Payer: Self-pay | Admitting: Neurology

## 2017-05-29 DIAGNOSIS — N3941 Urge incontinence: Secondary | ICD-10-CM | POA: Diagnosis not present

## 2017-05-29 DIAGNOSIS — C673 Malignant neoplasm of anterior wall of bladder: Secondary | ICD-10-CM | POA: Diagnosis not present

## 2017-05-29 DIAGNOSIS — R3915 Urgency of urination: Secondary | ICD-10-CM | POA: Diagnosis not present

## 2017-05-31 ENCOUNTER — Telehealth: Payer: Self-pay | Admitting: Cardiology

## 2017-05-31 NOTE — Telephone Encounter (Signed)
Spoke w/ pt and requested that he send a manual transmission b/c his home monitor has not updated in at least 14 days.   

## 2017-06-04 ENCOUNTER — Telehealth: Payer: Self-pay | Admitting: *Deleted

## 2017-06-04 NOTE — Telephone Encounter (Signed)
Spoke with patient and patient's wife regarding sending manual transmission. 3 tachy episodes noted, 1 Available ECG. Attempted to send manual transmission, patient receiving 3230 error code. [employer] number given to patient's wife. Advised patient and patient's wife to call [employer] services to help troubleshoot the monitor. Patient's wife verbalized understanding.   Advised patient's wife will review Full report once we receive manual transmission.

## 2017-06-07 DIAGNOSIS — I10 Essential (primary) hypertension: Secondary | ICD-10-CM | POA: Diagnosis not present

## 2017-06-07 DIAGNOSIS — N183 Chronic kidney disease, stage 3 (moderate): Secondary | ICD-10-CM | POA: Diagnosis not present

## 2017-06-07 DIAGNOSIS — E782 Mixed hyperlipidemia: Secondary | ICD-10-CM | POA: Diagnosis not present

## 2017-06-07 DIAGNOSIS — R7301 Impaired fasting glucose: Secondary | ICD-10-CM | POA: Diagnosis not present

## 2017-06-07 NOTE — Telephone Encounter (Signed)
Manual transmission received. 3 tachy episodes appear SVT, per AS, continue to monitor LINQ.

## 2017-06-11 NOTE — Progress Notes (Signed)
08-07-16 (EPIC) EKG 08-18-16 (EPIC)CXR

## 2017-06-11 NOTE — Patient Instructions (Addendum)
AVARI GELLES  06/11/2017   Your procedure is scheduled on: 06-18-17  Report to [facility] Main  Entrance Take [place]  elevators to 3rd floor to  [place] at 9:40 AM.   Call this number if you have problems the morning of surgery [phone]    Remember: ONLY 1 PERSON MAY GO WITH YOU TO SHORT STAY TO GET  READY MORNING OF [place].  Do not eat food or drink liquids :After Midnight.     Take these medicines the morning of surgery with A SIP OF WATER: Carbidopa-Levidopa ER (Rytary)                                You may not have any metal on your body including hair pins and              piercings  Do not wear jewelry, make-up, lotions, powders, deodorant             Men may shave face and neck.   Do not bring valuables to the hospital. [place].  Contacts, dentures or bridgework may not be worn into surgery.      Patients discharged the day of surgery will not be allowed to drive home.  Name and phone number of your driver:Carolyn [phone]               Please read over the following fact sheets you were given: _____________________________________________________________________             [facility] - Preparing for Surgery Before surgery, you can play an important role.  Because skin is not sterile, your skin needs to be as free of germs as possible.  You can reduce the number of germs on your skin by washing with CHG (chlorahexidine gluconate) soap before surgery.  CHG is an antiseptic cleaner which kills germs and bonds with the skin to continue killing germs even after washing. Please DO NOT use if you have an allergy to CHG or antibacterial soaps.  If your skin becomes reddened/irritated stop using the CHG and inform your nurse when you arrive at Short Stay. Do not shave (including legs and underarms) for at least 48 hours prior to the first CHG shower.  You may shave your  face/neck. Please follow these instructions carefully:  1.  Shower with CHG Soap the night before surgery and the  morning of Surgery.  2.  If you choose to wash your hair, wash your hair first as usual with your  normal  shampoo.  3.  After you shampoo, rinse your hair and body thoroughly to remove the  shampoo.                           4.  Use CHG as you would any other liquid soap.  You can apply chg directly  to the skin and wash                       Gently with a scrungie or clean washcloth.  5.  Apply the CHG Soap to your body ONLY FROM THE NECK DOWN.   Do not use on face/ open  Wound or open sores. Avoid contact with eyes, ears mouth and genitals (private parts).                       Wash face,  Genitals (private parts) with your normal soap.             6.  Wash thoroughly, paying special attention to the area where your surgery  will be performed.  7.  Thoroughly rinse your body with warm water from the neck down.  8.  DO NOT shower/wash with your normal soap after using and rinsing off  the CHG Soap.                9.  Pat yourself dry with a clean towel.            10.  Wear clean pajamas.            11.  Place clean sheets on your bed the night of your first shower and do not  sleep with pets. Day of Surgery : Do not apply any lotions/deodorants the morning of surgery.  Please wear clean clothes to the hospital/surgery center.  FAILURE TO FOLLOW THESE INSTRUCTIONS MAY RESULT IN THE CANCELLATION OF YOUR SURGERY PATIENT SIGNATURE_________________________________  NURSE SIGNATURE__________________________________  ________________________________________________________________________

## 2017-06-12 ENCOUNTER — Encounter (HOSPITAL_COMMUNITY)
Admission: RE | Admit: 2017-06-12 | Discharge: 2017-06-12 | Disposition: A | Discharge status: Home / Self Care | Payer: Medicare HMO | Source: Ambulatory Visit | Attending: Urology | Admitting: Urology

## 2017-06-12 ENCOUNTER — Encounter (HOSPITAL_COMMUNITY): Payer: Self-pay

## 2017-06-12 DIAGNOSIS — Z01818 Encounter for other preprocedural examination: Secondary | ICD-10-CM | POA: Diagnosis present

## 2017-06-12 DIAGNOSIS — C679 Malignant neoplasm of bladder, unspecified: Secondary | ICD-10-CM | POA: Insufficient documentation

## 2017-06-12 LAB — CBC
HCT: 39.1 % (ref 39.0–52.0)
Hemoglobin: 12.9 g/dL — ABNORMAL LOW (ref 13.0–17.0)
MCH: 26 pg (ref 26.0–34.0)
MCHC: 33 g/dL (ref 30.0–36.0)
MCV: 78.8 fL (ref 78.0–100.0)
Platelets: 186 10*3/uL (ref 150–400)
RBC: 4.96 MIL/uL (ref 4.22–5.81)
RDW: 14.2 % (ref 11.5–15.5)
WBC: 4.6 10*3/uL (ref 4.0–10.5)

## 2017-06-12 LAB — BASIC METABOLIC PANEL
Anion gap: 6 (ref 5–15)
BUN: 15 mg/dL (ref 6–20)
CO2: 28 mmol/L (ref 22–32)
Calcium: 9.6 mg/dL (ref 8.9–10.3)
Chloride: 105 mmol/L (ref 101–111)
Creatinine, Ser: 1.17 mg/dL (ref 0.61–1.24)
GFR calc Af Amer: >60 mL/min (ref >60)
GFR calc non Af Amer: 59 mL/min — ABNORMAL LOW (ref >60)
Glucose, Bld: 109 mg/dL — ABNORMAL HIGH (ref 65–99)
Potassium: 4.8 mmol/L (ref 3.5–5.1)
Sodium: 139 mmol/L (ref 135–145)

## 2017-06-13 ENCOUNTER — Encounter (INDEPENDENT_AMBULATORY_CARE_PROVIDER_SITE_OTHER): Payer: Self-pay | Admitting: Neurology

## 2017-06-13 LAB — HEMOGLOBIN A1C
Hgb A1c MFr Bld: 6.4 % — ABNORMAL HIGH (ref 4.8–5.6)
Mean Plasma Glucose: 137 mg/dL

## 2017-06-14 ENCOUNTER — Ambulatory Visit (INDEPENDENT_AMBULATORY_CARE_PROVIDER_SITE_OTHER): Payer: Medicare HMO | Admitting: *Deleted

## 2017-06-14 ENCOUNTER — Encounter (INDEPENDENT_AMBULATORY_CARE_PROVIDER_SITE_OTHER): Payer: Self-pay | Admitting: Neurology

## 2017-06-14 DIAGNOSIS — I639 Cerebral infarction, unspecified: Secondary | ICD-10-CM

## 2017-06-18 ENCOUNTER — Encounter (HOSPITAL_COMMUNITY)
Admission: RE | Disposition: A | Discharge status: Home / Self Care | Payer: Self-pay | Source: Ambulatory Visit | Attending: Urology

## 2017-06-18 ENCOUNTER — Ambulatory Visit (HOSPITAL_COMMUNITY): Payer: Medicare HMO | Admitting: Certified Registered Nurse Anesthetist

## 2017-06-18 ENCOUNTER — Ambulatory Visit (HOSPITAL_COMMUNITY)
Admission: RE | Admit: 2017-06-18 | Discharge: 2017-06-18 | Disposition: A | Discharge status: Home / Self Care | Payer: Medicare HMO | Source: Ambulatory Visit | Attending: Urology | Admitting: Urology

## 2017-06-18 ENCOUNTER — Encounter (HOSPITAL_COMMUNITY): Payer: Self-pay | Admitting: *Deleted

## 2017-06-18 ENCOUNTER — Ambulatory Visit (INDEPENDENT_AMBULATORY_CARE_PROVIDER_SITE_OTHER): Payer: Medicare (Managed Care) | Admitting: Neurology

## 2017-06-18 DIAGNOSIS — N528 Other male erectile dysfunction: Secondary | ICD-10-CM | POA: Insufficient documentation

## 2017-06-18 DIAGNOSIS — R269 Unspecified abnormalities of gait and mobility: Secondary | ICD-10-CM | POA: Insufficient documentation

## 2017-06-18 DIAGNOSIS — N400 Enlarged prostate without lower urinary tract symptoms: Secondary | ICD-10-CM | POA: Diagnosis not present

## 2017-06-18 DIAGNOSIS — I6523 Occlusion and stenosis of bilateral carotid arteries: Secondary | ICD-10-CM | POA: Insufficient documentation

## 2017-06-18 DIAGNOSIS — R7303 Prediabetes: Secondary | ICD-10-CM | POA: Insufficient documentation

## 2017-06-18 DIAGNOSIS — H409 Unspecified glaucoma: Secondary | ICD-10-CM | POA: Insufficient documentation

## 2017-06-18 DIAGNOSIS — I10 Essential (primary) hypertension: Secondary | ICD-10-CM | POA: Diagnosis not present

## 2017-06-18 DIAGNOSIS — Z9889 Other specified postprocedural states: Secondary | ICD-10-CM | POA: Insufficient documentation

## 2017-06-18 DIAGNOSIS — Z8711 Personal history of peptic ulcer disease: Secondary | ICD-10-CM | POA: Insufficient documentation

## 2017-06-18 DIAGNOSIS — I69398 Other sequelae of cerebral infarction: Secondary | ICD-10-CM | POA: Diagnosis not present

## 2017-06-18 DIAGNOSIS — N419 Inflammatory disease of prostate, unspecified: Secondary | ICD-10-CM | POA: Diagnosis not present

## 2017-06-18 DIAGNOSIS — Z881 Allergy status to other antibiotic agents status: Secondary | ICD-10-CM | POA: Diagnosis not present

## 2017-06-18 DIAGNOSIS — D494 Neoplasm of unspecified behavior of bladder: Secondary | ICD-10-CM | POA: Diagnosis not present

## 2017-06-18 DIAGNOSIS — N3090 Cystitis, unspecified without hematuria: Secondary | ICD-10-CM | POA: Insufficient documentation

## 2017-06-18 DIAGNOSIS — Z888 Allergy status to other drugs, medicaments and biological substances status: Secondary | ICD-10-CM | POA: Insufficient documentation

## 2017-06-18 DIAGNOSIS — Z8551 Personal history of malignant neoplasm of bladder: Secondary | ICD-10-CM | POA: Diagnosis not present

## 2017-06-18 DIAGNOSIS — E785 Hyperlipidemia, unspecified: Secondary | ICD-10-CM | POA: Diagnosis not present

## 2017-06-18 DIAGNOSIS — Z7901 Long term (current) use of anticoagulants: Secondary | ICD-10-CM | POA: Diagnosis not present

## 2017-06-18 DIAGNOSIS — Z885 Allergy status to narcotic agent status: Secondary | ICD-10-CM | POA: Diagnosis not present

## 2017-06-18 DIAGNOSIS — F482 Pseudobulbar affect: Secondary | ICD-10-CM | POA: Diagnosis not present

## 2017-06-18 DIAGNOSIS — Z79899 Other long term (current) drug therapy: Secondary | ICD-10-CM | POA: Diagnosis not present

## 2017-06-18 DIAGNOSIS — G20 Parkinson's disease: Secondary | ICD-10-CM | POA: Insufficient documentation

## 2017-06-18 DIAGNOSIS — R69 Illness, unspecified: Secondary | ICD-10-CM | POA: Diagnosis not present

## 2017-06-18 DIAGNOSIS — C679 Malignant neoplasm of bladder, unspecified: Secondary | ICD-10-CM | POA: Diagnosis not present

## 2017-06-18 DIAGNOSIS — Z882 Allergy status to sulfonamides status: Secondary | ICD-10-CM | POA: Insufficient documentation

## 2017-06-18 HISTORY — PX: TRANSURETHRAL RESECTION OF BLADDER TUMOR: SHX2575

## 2017-06-18 SURGERY — TURBT (TRANSURETHRAL RESECTION OF BLADDER TUMOR)
Anesthesia: General | Site: Urethra

## 2017-06-18 MED ORDER — HYDRALAZINE HCL 20 MG/ML IJ SOLN
INTRAMUSCULAR | Status: AC
  Filled 2017-06-18: qty 1

## 2017-06-18 MED ORDER — ONDANSETRON HCL 4 MG/2ML IJ SOLN
INTRAMUSCULAR | Status: DC | PRN
  Administered 2017-06-18: 4 mg via INTRAVENOUS

## 2017-06-18 MED ORDER — HYDROCODONE-ACETAMINOPHEN 5-325 MG PO TABS: 1.0000 | ORAL_TABLET | ORAL | 0 refills | Status: DC | PRN

## 2017-06-18 MED ORDER — LIDOCAINE 2% (20 MG/ML) 5 ML SYRINGE
INTRAMUSCULAR | Status: DC | PRN
  Administered 2017-06-18: 70 mg via INTRAVENOUS

## 2017-06-18 MED ORDER — FENTANYL CITRATE (PF) 100 MCG/2ML IJ SOLN
INTRAMUSCULAR | Status: DC | PRN
  Administered 2017-06-18 (×2): 50 ug via INTRAVENOUS

## 2017-06-18 MED ORDER — METOPROLOL SUCCINATE ER 100 MG PO TB24
100.0000 mg | ORAL_TABLET | Freq: Once | ORAL | Status: AC
  Administered 2017-06-18: 100 mg via ORAL
  Filled 2017-06-18: qty 1

## 2017-06-18 MED ORDER — PROPOFOL 10 MG/ML IV BOLUS
INTRAVENOUS | Status: AC
  Filled 2017-06-18: qty 20

## 2017-06-18 MED ORDER — MIRABEGRON ER 25 MG PO TB24: 25.0000 mg | ORAL_TABLET | Freq: Every day | ORAL | 0 refills | Status: DC

## 2017-06-18 MED ORDER — PROPOFOL 10 MG/ML IV BOLUS
INTRAVENOUS | Status: DC | PRN
  Administered 2017-06-18: 150 mg via INTRAVENOUS

## 2017-06-18 MED ORDER — PHENAZOPYRIDINE HCL 100 MG PO TABS: 100.0000 mg | ORAL_TABLET | Freq: Three times a day (TID) | ORAL | 0 refills | Status: DC | PRN

## 2017-06-18 MED ORDER — FENTANYL CITRATE (PF) 100 MCG/2ML IJ SOLN
INTRAMUSCULAR | Status: AC
  Filled 2017-06-18: qty 2

## 2017-06-18 MED ORDER — DEXTROSE 5 % IV SOLN
2.0000 g | INTRAVENOUS | Status: AC
  Administered 2017-06-18: 2 g via INTRAVENOUS
  Filled 2017-06-18: qty 2

## 2017-06-18 MED ORDER — HYOSCYAMINE SULFATE 0.125 MG SL SUBL: 0.1250 mg | SUBLINGUAL_TABLET | SUBLINGUAL | 0 refills | Status: DC | PRN

## 2017-06-18 MED ORDER — ROCURONIUM BROMIDE 50 MG/5ML IV SOSY
PREFILLED_SYRINGE | INTRAVENOUS | Status: AC
  Filled 2017-06-18: qty 5

## 2017-06-18 MED ORDER — ROCURONIUM BROMIDE 50 MG/5ML IV SOSY
PREFILLED_SYRINGE | INTRAVENOUS | Status: DC | PRN
  Administered 2017-06-18: 40 mg via INTRAVENOUS

## 2017-06-18 MED ORDER — HYOSCYAMINE SULFATE 0.125 MG SL SUBL
0.1250 mg | SUBLINGUAL_TABLET | Freq: Once | SUBLINGUAL | Status: AC
  Administered 2017-06-18: 0.125 mg via SUBLINGUAL
  Filled 2017-06-18: qty 1

## 2017-06-18 MED ORDER — SUGAMMADEX SODIUM 200 MG/2ML IV SOLN
INTRAVENOUS | Status: DC | PRN
  Administered 2017-06-18: 200 mg via INTRAVENOUS

## 2017-06-18 MED ORDER — FENTANYL CITRATE (PF) 100 MCG/2ML IJ SOLN
25.0000 ug | INTRAMUSCULAR | Status: DC | PRN
  Administered 2017-06-18 (×3): 50 ug via INTRAVENOUS

## 2017-06-18 MED ORDER — LACTATED RINGERS IV SOLN
INTRAVENOUS | Status: DC
  Administered 2017-06-18: 15:00:00 via INTRAVENOUS
  Administered 2017-06-18: 1000 mL via INTRAVENOUS

## 2017-06-18 MED ORDER — SODIUM CHLORIDE 0.9 % IR SOLN
Status: DC | PRN
  Administered 2017-06-18: 6000 mL via INTRAVESICAL

## 2017-06-18 MED ORDER — ONDANSETRON HCL 4 MG/2ML IJ SOLN
INTRAMUSCULAR | Status: AC
  Filled 2017-06-18: qty 2

## 2017-06-18 MED ORDER — PROMETHAZINE HCL 25 MG/ML IJ SOLN: 6.2500 mg | INTRAMUSCULAR | Status: DC | PRN

## 2017-06-18 MED ORDER — HYDRALAZINE HCL 20 MG/ML IJ SOLN
10.0000 mg | Freq: Once | INTRAMUSCULAR | Status: AC
  Administered 2017-06-18: 10 mg via INTRAVENOUS

## 2017-06-18 MED ORDER — DEXAMETHASONE SODIUM PHOSPHATE 10 MG/ML IJ SOLN
INTRAMUSCULAR | Status: DC | PRN
  Administered 2017-06-18: 10 mg via INTRAVENOUS

## 2017-06-18 MED ORDER — 0.9 % SODIUM CHLORIDE (POUR BTL) OPTIME
TOPICAL | Status: DC | PRN
  Administered 2017-06-18: 1000 mL

## 2017-06-18 MED ORDER — LIDOCAINE 2% (20 MG/ML) 5 ML SYRINGE
INTRAMUSCULAR | Status: AC
  Filled 2017-06-18: qty 5

## 2017-06-18 SURGICAL SUPPLY — 16 items
BAG URINE DRAINAGE (UROLOGICAL SUPPLIES) ×2 IMPLANT
BAG URO CATCHER STRL LF (MISCELLANEOUS) ×2 IMPLANT
CATH FOLEY 3WAY 30CC 22FR (CATHETERS) ×2 IMPLANT
CATH HEMA 3WAY 30CC 22FR COUDE (CATHETERS) IMPLANT
COVER SURGICAL LIGHT HANDLE (MISCELLANEOUS) ×2 IMPLANT
ELECT REM PT RETURN 15FT ADLT (MISCELLANEOUS) ×2 IMPLANT
EVACUATOR MICROVAS BLADDER (UROLOGICAL SUPPLIES) IMPLANT
GLOVE BIOGEL M 8.0 STRL (GLOVE) ×2 IMPLANT
GOWN STRL REUS W/TWL LRG LVL3 (GOWN DISPOSABLE) ×2 IMPLANT
GOWN STRL REUS W/TWL XL LVL3 (GOWN DISPOSABLE) ×2 IMPLANT
LOOP CUT BIPOLAR 24F LRG (ELECTROSURGICAL) ×2 IMPLANT
MANIFOLD NEPTUNE II (INSTRUMENTS) ×2 IMPLANT
PACK CYSTO (CUSTOM PROCEDURE TRAY) ×2 IMPLANT
SET ASPIRATION TUBING (TUBING) IMPLANT
SYRINGE IRR TOOMEY STRL 70CC (SYRINGE) IMPLANT
TUBING CONNECTING 10 (TUBING) ×2 IMPLANT

## 2017-06-18 NOTE — Progress Notes (Signed)
Hyoscyamine Sulfate 0.125mg  SL given for abdominal cramping and discomfort.

## 2017-06-18 NOTE — Anesthesia Procedure Notes (Signed)
Procedure Name: Intubation Date/Time: 06/18/2017 12:21 PM Performed by: Montel Clock Pre-anesthesia Checklist: Patient identified, Emergency Drugs available, Suction available, Patient being monitored and Timeout performed Patient Re-evaluated:Patient Re-evaluated prior to induction Oxygen Delivery Method: Circle system utilized Preoxygenation: Pre-oxygenation with 100% oxygen Induction Type: IV induction Ventilation: Mask ventilation without difficulty and Oral airway inserted - appropriate to patient size Laryngoscope Size: Mac and 3 Grade View: Grade I Tube type: Oral Tube size: 7.5 mm Number of attempts: 1 Airway Equipment and Method: Stylet Placement Confirmation: ETT inserted through vocal cords under direct vision,  positive ETCO2 and breath sounds checked- equal and bilateral Secured at: 23 cm Tube secured with: Tape Dental Injury: Teeth and Oropharynx as per pre-operative assessment

## 2017-06-18 NOTE — Transfer of Care (Signed)
Immediate Anesthesia Transfer of Care Note  Patient: Mike Nixon  Procedure(s) Performed: Procedure(s): TRANSURETHRAL RESECTION OF BLADDER TUMOR (TURBT) (N/A)  Patient Location: PACU  Anesthesia Type:General  Level of Consciousness:  sedated, patient cooperative and responds to stimulation  Airway & Oxygen Therapy:Patient Spontanous Breathing and Patient connected to face mask oxgen  Post-op Assessment:  Report given to PACU RN and Post -op Vital signs reviewed and stable  Post vital signs:  Reviewed and stable  Last Vitals:  Vitals:   06/18/17 0923  BP: (!) 150/83  Pulse: 69  Resp: 18  Temp: 00.8 C    Complications: No apparent anesthesia complications

## 2017-06-18 NOTE — Op Note (Signed)
.  Preoperative diagnosis: bladder tumor  Postoperative diagnosis: Same  Procedure: 1 cystoscopy 2. Transurethral resection of bladder tumor, small  Attending: Rosie Fate  Anesthesia: General  Estimated blood loss: Minimal  Drains: 22 French foley  Specimens: 1. Left lateral wall bladder tumor 2. Prostate tumor  Antibiotics: rocephin  Findings: erythema at previous left lateral wall resection site. 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  After discussing treatment options, they decided proceed with transurethral resection of a bladder tumor.  Procedure her in detail: The patient was brought to the operating room and a brief timeout was done to ensure correct patient, correct procedure, correct site.  General anesthesia was administered patient was placed in dorsal lithotomy position.  Their genitalia was then prepped and draped in usual sterile fashion.  A rigid 79 French cystoscope was passed in the urethra and the bladder.  Bladder was inspected and we noted a 1cm left lateral wall bladder tumor and left lateral lobe prostate tumor.  the ureteral orifices were in the normal orthotopic locations.   Using the bipolar resectoscope we removed the bladder and prostate tumors down to the base. A subsequent muscle deep biopsy was then taken. Hemostasis was then obtained with electrocautery. We then removed the bladder tumor chips and sent them for pathology. We then re-inspected the bladder and found no residula bleeding.  the bladder was then drained, a 22 French foley was placed and this concluded the procedure which was well tolerated by patient.  Complications: None  Condition: Stable, extubated, transferred to PACU  Plan: Patient is to be discharged home and followup in 5 days for foley catheter removal and pathology discussion.

## 2017-06-18 NOTE — Discharge Instructions (Signed)
Indwelling Urinary Catheter Care, Adult  Take good care of your catheter to keep it working and to prevent problems.  How to wear your catheter  Attach your catheter to your leg with tape (adhesive tape) or a leg strap. Make sure it is not too tight. If you use tape, remove any bits of tape that are already on the catheter.  How to wear a drainage bag  You should have:   A large overnight bag.   A small leg bag.    Overnight Bag  You may wear the overnight bag at any time. Always keep the bag below the level of your bladder but off the floor. When you sleep, put a clean plastic bag in a wastebasket. Then hang the bag inside the wastebasket.  Leg Bag  Never wear the leg bag at night. Always wear the leg bag below your knee. Keep the leg bag secure with a leg strap or tape.  How to care for your skin   Clean the skin around the catheter at least once every day.   Shower every day. Do not take baths.   Put creams, lotions, or ointments on your genital area only as told by your doctor.   Do not use powders, sprays, or lotions on your genital area.  How to clean your catheter and your skin  1. Wash your hands with soap and water.  2. Wet a washcloth in warm water and gentle (mild) soap.  3. Use the washcloth to clean the skin where the catheter enters your body. Clean downward and wipe away from the catheter in small circles. Do not wipe toward the catheter.  4. Pat the area dry with a clean towel. Make sure to clean off all soap.  How to care for your drainage bags  Empty your drainage bag when it is ?- full or at least 2-3 times a day. Replace your drainage bag once a month or sooner if it starts to smell bad or look dirty. Do not clean your drainage bag unless told by your doctor.  Emptying a drainage bag    Supplies Needed   Rubbing alcohol.   Gauze pad or cotton ball.   Tape or a leg strap.    Steps  1. Wash your hands with soap and water.  2. Separate (detach) the bag from your leg.  3. Hold the bag over  the toilet or a clean container. Keep the bag below your hips and bladder. This stops pee (urine) from going back into the tube.  4. Open the pour spout at the bottom of the bag.  5. Empty the pee into the toilet or container. Do not let the pour spout touch any surface.  6. Put rubbing alcohol on a gauze pad or cotton ball.  7. Use the gauze pad or cotton ball to clean the pour spout.  8. Close the pour spout.  9. Attach the bag to your leg with tape or a leg strap.  10. Wash your hands.    Changing a drainage bag  Supplies Needed   Alcohol wipes.   A clean drainage bag.   Adhesive tape or a leg strap.    Steps  1. Wash your hands with soap and water.  2. Separate the dirty bag from your leg.  3. Pinch the rubber catheter with your fingers so that pee does not spill out.  4. Separate the catheter tube from the drainage tube where these tubes connect (at the   connection valve). Do not let the tubes touch any surface.  5. Clean the end of the catheter tube with an alcohol wipe. Use a different alcohol wipe to clean the end of the drainage tube.  6. Connect the catheter tube to the drainage tube of the clean bag.  7. Attach the new bag to the leg with adhesive tape or a leg strap.  8. Wash your hands.    How to prevent infection and other problems   Never pull on your catheter or try to remove it. Pulling can damage tissue in your body.   Always wash your hands before and after touching your catheter.   If a leg strap gets wet, replace it with a dry one.   Drink enough fluids to keep your pee clear or pale yellow, or as told by your doctor.   Do not let the drainage bag or tubing touch the floor.   Wear cotton underwear.   If you are male, wipe from front to back after you poop (have a bowel movement).   Check on the catheter often to make sure it works and the tubing is not twisted.  Get help if:   Your pee is cloudy.   Your pee smells unusually bad.   Your pee is not draining into the bag.   Your  tube gets clogged.   Your catheter starts to leak.   Your bladder feels full.  Get help right away if:   You have redness, swelling, or pain where the catheter enters your body.   You have fluid, pus, or a bad smell coming from the area where the catheter enters your body.   The area where the catheter enters your body feels warm.   You have a fever.   You have pain in your:  ? Stomach (abdomen).  ? Legs.  ? Lower back.  ? Bladder.   You see blood fill the catheter.   Your pee is pink or red.   You feel sick to your stomach (nauseous).   You throw up (vomit).   You have chills.   Your catheter gets pulled out.  This information is not intended to replace advice given to you by your health care provider. Make sure you discuss any questions you have with your health care provider.  Document Released: 03/10/2013 Document Revised: 10/11/2016 Document Reviewed: 04/28/2014  Elsevier Interactive Patient Education  2018 [employer]

## 2017-06-18 NOTE — Anesthesia Postprocedure Evaluation (Signed)
Anesthesia Post Note  Patient: Mike Nixon  Procedure(s) Performed: Procedure(s) (LRB): TRANSURETHRAL RESECTION OF BLADDER TUMOR (TURBT) (N/A)     Patient location during evaluation: PACU Anesthesia Type: General Level of consciousness: awake and alert Pain management: pain level controlled Vital Signs Assessment: post-procedure vital signs reviewed and stable Respiratory status: spontaneous breathing, nonlabored ventilation, respiratory function stable and patient connected to nasal cannula oxygen Cardiovascular status: blood pressure returned to baseline and stable Postop Assessment: no signs of nausea or vomiting Anesthetic complications: no    Last Vitals:  Vitals:   06/18/17 1445 06/18/17 1500  BP: (!) 143/94 135/82  Pulse: 75 68  Resp: 19   Temp:      Last Pain:  Vitals:   06/18/17 1432  TempSrc:   PainSc: 10-Worst pain ever                 Keirsten Matuska S

## 2017-06-18 NOTE — Telephone Encounter (Signed)
Dr. Rayann Heman reviewed transmission, nonsustained SVT with no symtpoms, recommends to continue to follow.

## 2017-06-18 NOTE — H&P (Signed)
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 Anticoagulant long-term use    PLAVIX  . Benign localized prostatic hyperplasia with lower urinary tract symptoms (LUTS)   . Bladder tumor   . Borderline diabetes    watches diet  . ED (erectile dysfunction) of organic origin   . Gait disturbance, post-stroke    uses wheelchair long distance  . Glaucoma, right eye   . History of bleeding peptic ulcer    03/ 1979  s/p  surgery  . History of concussion    1987 no loc-- no residual  . History of CVA with residual deficit    05/ 2016 CYPTOGENIC CVA --- RESIDUAL GAIT DISTURBANCE AND SIALORRHEA ACCESS (DROOLING)  . Hyperlipidemia   . Hypertension   . Mild atherosclerosis of both carotid arteries    per duplex from outside facitlity 04-08-2015 less then 50% stenosis bilateral ICA  . Parkinson's disease [facility]) neurologist -- dr r. Mitzi Hansen falconer ([facility], [facility])   dx 2015  . PBA (pseudobulbar affect)    laughter-- residual from cva 05/ 2016  . PFO (patent foramen ovale)    per TEE 08-18-2016  . Speech impairment    post-stroke  . Status post placement of implantable loop recorder 08/18/2016   placed by dr allred  . Wears glasses    Past Surgical History:  Procedure Laterality Date  . APPENDECTOMY  1978  . CATARACT EXTRACTION W/ INTRAOCULAR LENS IMPLANT Right 2014 approx.  . CYSTOSCOPY W/ URETERAL STENT PLACEMENT Bilateral 05/07/2017   Procedure: CYSTOSCOPY WITH RETROGRADE PYELOGRAM/URETERAL;  Surgeon: Cleon Gustin, MD;  Location: [facility];  Service: Urology;  Laterality: Bilateral;  . EP IMPLANTABLE DEVICE N/A 08/18/2016   Procedure: Loop Recorder Insertion;  Surgeon: Thompson Grayer, MD;  Location: [facility];  Service: Cardiovascular;  Laterality: N/A;  .  REMOVAL NODULES BILATERAL FLANK  2008 approx.   per pt benign fatty tissue  . REPAIR OF PERFORATED ULCER  01/1978   bleeding peptic ulcer  . TEE WITHOUT CARDIOVERSION N/A 08/18/2016   Procedure: TRANSESOPHAGEAL ECHOCARDIOGRAM (TEE);  Surgeon: Sueanne Margarita, MD;  Location: [facility] ENDOSCOPY;  Service: Cardiovascular;  Laterality: N/A; no evidence thrombus;  mobile atrial septum w/ positive bubble study consistent with PFO/ trivial AR, mild TR  . TRANSTHORACIC ECHOCARDIOGRAM  04/09/2015   ef 81-85%, grade 1 diastolic dysfunction/  mild AV sclerosis without stenosis/  mild TR,  trivial Mike and PR  . TRANSURETHRAL RESECTION OF BLADDER TUMOR N/A 05/07/2017   Procedure: TRANSURETHRAL RESECTION OF BLADDER TUMOR (TURBT);  Surgeon: Cleon Gustin, MD;  Location: [facility];  Service: Urology;  Laterality: N/A;  . TRANSURETHRAL RESECTION OF PROSTATE  08-20-2013   at [facility] Medications:  Prescriptions Prior to Admission  Medication Sig Dispense Refill Last Dose  . atorvastatin (LIPITOR) 40 MG tablet Take 40 mg by mouth every evening.   Past Month at Unknown time  . Carbidopa-Levodopa ER (RYTARY) 36.25-145 MG CPCR Take 2 capsules by mouth 3 (three) times daily.    06/18/2017 at 0700  . clopidogrel (PLAVIX) 75 MG tablet Take 75 mg by mouth daily.    06/04/2017  . metoprolol succinate (TOPROL-XL) 100 MG 24 hr tablet Take 100 mg by mouth every evening.    Past  Month at Unknown time  . mirabegron ER (MYRBETRIQ) 50 MG TB24 tablet Take 50 mg by mouth daily.   Past Month at Unknown time  . Travoprost, BAK Free, (TRAVATAN) 0.004 % SOLN ophthalmic solution Place 1 drop into both eyes at bedtime.    Past Week at Unknown time  . HYDROcodone-acetaminophen (NORCO/VICODIN) 5-325 MG tablet Take 1-2 tablets by mouth every 4 (four) hours as needed for moderate pain. (Patient not taking: Reported on 06/08/2017) 30 tablet 0 Not Taking at Unknown time  . hyoscyamine (LEVSIN SL) 0.125 MG SL tablet Take 1  tablet (0.125 mg total) by mouth every 4 (four) hours as needed for cramping. (Patient not taking: Reported on 06/08/2017) 30 tablet 0 Not Taking at Unknown time  . mirabegron ER (MYRBETRIQ) 25 MG TB24 tablet Take 1 tablet (25 mg total) by mouth daily. (Patient not taking: Reported on 06/08/2017) 30 tablet 0 Not Taking at Unknown time  . phenazopyridine (PYRIDIUM) 100 MG tablet Take 1 tablet (100 mg total) by mouth 3 (three) times daily as needed for pain. (Patient not taking: Reported on 06/08/2017) 20 tablet 0 Not Taking at Unknown time   Allergies:  Allergies  Allergen Reactions  . Benazepril Other (See Comments)    Pt not sure of reaction. ?cough  . Doxycycline     Unknown  . Levofloxacin Hives and Swelling  . Simvastatin Other (See Comments)    Pt does not remember what the reaction was. ?muscle aches  . Sulfamethoxazole-Trimethoprim Hives and Swelling  . Tamsulosin Hives and Swelling  . Tramadol   . Tramadol Hcl Hives and Swelling  . Sulfa Antibiotics Hives, Swelling and Rash    Severe rash    Family History  Problem Relation Age of Onset  . Stroke Sister    Social History:  reports that he has never smoked. He has never used smokeless tobacco. He reports that he does not drink alcohol or use drugs.  Review of Systems  Genitourinary: Positive for dysuria.  All other systems reviewed and are negative.   Physical Exam:  Vital signs in last 24 hours: Temp:  [97.9 F (36.6 C)] 97.9 F (36.6 C) (07/23 0923) Pulse Rate:  [69] 69 (07/23 0923) Resp:  [18] 18 (07/23 0923) BP: (150)/(83) 150/83 (07/23 0923) SpO2:  [100 %] 100 % (07/23 0923) Weight:  [70.3 kg (155 lb)] 70.3 kg (155 lb) (07/23 0954) Physical Exam  Constitutional: He is oriented to person, place, and time. He appears well-developed and well-nourished.  HENT:  Head: Normocephalic and atraumatic.  Eyes: Pupils are equal, round, and reactive to light. EOM are normal.  Neck: Normal range of motion. No thyromegaly  present.  Cardiovascular: Normal rate and regular rhythm.   Respiratory: Effort normal. No respiratory distress.  GI: Soft. He exhibits no distension.  Musculoskeletal: Normal range of motion. He exhibits no edema.  Neurological: He is alert and oriented to person, place, and time.  Skin: Skin is warm and dry.  Psychiatric: He has a normal mood and affect. His behavior is normal. Judgment and thought content normal.    Laboratory Data:  No results found for this or any previous visit (from the past 24 hour(s)). No results found for this or any previous visit (from the past 240 hour(s)). Creatinine:  Recent Labs  06/12/17 1142  CREATININE 1.17   Baseline Creatinine: 1.2  Impression/Assessment:  76yo with high grade bladder cancer  Plan:  The risks/benefits/alternatives to repeat TURBT was explained to the patient and he  understands and wishes to proceed with surgery  Nicolette Bang 06/18/2017, 11:51 AM

## 2017-06-18 NOTE — Anesthesia Preprocedure Evaluation (Signed)
Anesthesia Evaluation  Patient identified by MRN, date of birth, ID band Patient awake    Reviewed: Allergy & Precautions, NPO status , Patient's Chart, lab work & pertinent test results  Airway Mallampati: II  TM Distance: >3 FB Neck ROM: Full    Dental no notable dental hx.    Pulmonary neg pulmonary ROS,    Pulmonary exam normal breath sounds clear to auscultation       Cardiovascular hypertension, Pt. on medications and Pt. on home beta blockers Normal cardiovascular exam Rhythm:Regular Rate:Normal     Neuro/Psych parkinsons dz  CVA 03/2015 CVA, Residual Symptoms negative psych ROS   GI/Hepatic negative GI ROS, Neg liver ROS,   Endo/Other  negative endocrine ROS  Renal/GU negative Renal ROS  negative genitourinary   Musculoskeletal negative musculoskeletal ROS (+)   Abdominal   Peds negative pediatric ROS (+)  Hematology negative hematology ROS (+)   Anesthesia Other Findings   Reproductive/Obstetrics negative OB ROS                             Anesthesia Physical Anesthesia Plan  ASA: III  Anesthesia Plan: General   Post-op Pain Management:    Induction: Intravenous  PONV Risk Score and Plan: 1 and Ondansetron, Dexamethasone and Propofol  Airway Management Planned: LMA and Oral ETT  Additional Equipment:   Intra-op Plan:   Post-operative Plan:   Informed Consent: I have reviewed the patients History and Physical, chart, labs and discussed the procedure including the risks, benefits and alternatives for the proposed anesthesia with the patient or authorized representative who has indicated his/her understanding and acceptance.   Dental advisory given  Plan Discussed with: CRNA and Surgeon  Anesthesia Plan Comments:         Anesthesia Quick Evaluation

## 2017-06-18 NOTE — Progress Notes (Signed)
Dr. Kalman Shan made aware of patient's blood pressures  And heart rates in PACU-also that patient had received Toprol XL pre-op- Apresoline 10 mg IVP given as ordered

## 2017-06-19 ENCOUNTER — Encounter (HOSPITAL_COMMUNITY): Payer: Self-pay | Admitting: Urology

## 2017-06-19 ENCOUNTER — Other Ambulatory Visit: Payer: Self-pay | Admitting: Internal Medicine

## 2017-06-19 NOTE — Progress Notes (Signed)
Carelink Summary Report / Loop Recorder 

## 2017-06-21 DIAGNOSIS — I10 Essential (primary) hypertension: Secondary | ICD-10-CM | POA: Diagnosis not present

## 2017-06-21 DIAGNOSIS — Z881 Allergy status to other antibiotic agents status: Secondary | ICD-10-CM | POA: Diagnosis not present

## 2017-06-21 DIAGNOSIS — I2699 Other pulmonary embolism without acute cor pulmonale: Secondary | ICD-10-CM | POA: Diagnosis not present

## 2017-06-21 DIAGNOSIS — C679 Malignant neoplasm of bladder, unspecified: Secondary | ICD-10-CM | POA: Diagnosis not present

## 2017-06-21 DIAGNOSIS — R079 Chest pain, unspecified: Secondary | ICD-10-CM | POA: Diagnosis not present

## 2017-06-21 DIAGNOSIS — I69351 Hemiplegia and hemiparesis following cerebral infarction affecting right dominant side: Secondary | ICD-10-CM | POA: Diagnosis not present

## 2017-06-21 DIAGNOSIS — I77819 Aortic ectasia, unspecified site: Secondary | ICD-10-CM | POA: Diagnosis not present

## 2017-06-21 DIAGNOSIS — G20 Parkinson's disease: Secondary | ICD-10-CM | POA: Diagnosis not present

## 2017-06-22 DIAGNOSIS — C673 Malignant neoplasm of anterior wall of bladder: Secondary | ICD-10-CM | POA: Diagnosis not present

## 2017-06-29 LAB — CUP PACEART REMOTE DEVICE CHECK
Date Time Interrogation Session: 20180720074912
Implantable Pulse Generator Implant Date: 20170922

## 2017-07-03 DIAGNOSIS — Z8551 Personal history of malignant neoplasm of bladder: Secondary | ICD-10-CM | POA: Diagnosis not present

## 2017-07-03 DIAGNOSIS — Z8673 Personal history of transient ischemic attack (TIA), and cerebral infarction without residual deficits: Secondary | ICD-10-CM | POA: Diagnosis not present

## 2017-07-03 DIAGNOSIS — Z7901 Long term (current) use of anticoagulants: Secondary | ICD-10-CM | POA: Diagnosis not present

## 2017-07-03 DIAGNOSIS — G20 Parkinson's disease: Secondary | ICD-10-CM | POA: Diagnosis not present

## 2017-07-03 DIAGNOSIS — R079 Chest pain, unspecified: Secondary | ICD-10-CM | POA: Diagnosis not present

## 2017-07-03 DIAGNOSIS — R531 Weakness: Secondary | ICD-10-CM | POA: Diagnosis not present

## 2017-07-03 DIAGNOSIS — N029 Recurrent and persistent hematuria with unspecified morphologic changes: Secondary | ICD-10-CM | POA: Diagnosis not present

## 2017-07-03 DIAGNOSIS — I2699 Other pulmonary embolism without acute cor pulmonale: Secondary | ICD-10-CM | POA: Diagnosis not present

## 2017-07-04 DIAGNOSIS — R079 Chest pain, unspecified: Secondary | ICD-10-CM | POA: Diagnosis not present

## 2017-07-04 DIAGNOSIS — C679 Malignant neoplasm of bladder, unspecified: Secondary | ICD-10-CM | POA: Diagnosis not present

## 2017-07-04 DIAGNOSIS — R310 Gross hematuria: Secondary | ICD-10-CM | POA: Diagnosis not present

## 2017-07-06 ENCOUNTER — Encounter (INDEPENDENT_AMBULATORY_CARE_PROVIDER_SITE_OTHER): Payer: Self-pay | Admitting: Neurology

## 2017-07-06 ENCOUNTER — Ambulatory Visit (INDEPENDENT_AMBULATORY_CARE_PROVIDER_SITE_OTHER): Payer: Medicare (Managed Care) | Admitting: Neurology

## 2017-07-06 VITALS — BP 114/74 | HR 69 | Ht 67.0 in | Wt 155.0 lb

## 2017-07-06 DIAGNOSIS — G20 Parkinson's disease: Secondary | ICD-10-CM

## 2017-07-06 DIAGNOSIS — R270 Ataxia, unspecified: Secondary | ICD-10-CM

## 2017-07-06 DIAGNOSIS — I639 Cerebral infarction, unspecified: Secondary | ICD-10-CM

## 2017-07-06 DIAGNOSIS — R471 Dysarthria and anarthria: Secondary | ICD-10-CM

## 2017-07-06 NOTE — Patient Instructions (Signed)
[email or web address][employer]fi    Our plan:     1) Please change Rytary to be 3 capsules of 145mg  3x daily.      Message me and let me know how this helps.  If there are still sudden weakness episodes, this could be due to the medication not lasting long enough.  If so, the next step would be to change the Rytary to 4x daily about 5-6 hours apart.     Today's Visit:      In today's visit I reviewed your medications and records relating your health - prior testing, blood work, reports of other health care providers present in your electronic medical record.     If you have records from non-[facility] doctors please send to [place] or bring to next office visit.     A copy of today's visit will be sent to your referring doctor and/or primary care doctor.    Let me know if there are things we could have done better for your office visit.    Patient satisfaction survey:      If you receive a patient satisfaction survey, I would greatly appreciate it if you would complete it.     We are like a car dealer where we aim for a score of 9 or 10.  If you had an experience that was less than a 9 or 10, please let me know so we can improve. If you had a good experience today, please let us know too.  We value your feedback.     Contact me online:      Patient Portal online - Please sign up for MyChart -- this is the best way to communicate me.  There is a messaging feature which can send messages directly to my inbox.  It is the best way to communicate with me and get test results, medication refills or ask questions.     You can expect to get a response within 24-48 hours during weekdays - if you do not, resend your request and inform us we did not respond. My goal is for every question answered as soon as possible. Average response for a phone call is 3 days due to the volume we receive (which is why MyChart is preferred).    MyChart should not be used if you are having a medical emergency -- call 911 in that case.        Coupons for medication:      If you would like to see if samples or vouchers available for your medicines please consider checking online.  Most medicines have web sites where you can print coupons or vouchers for you co-pay.     For example: AutomobileBuzz.is,  DSLRemote.se, Namendaxr.com, http://www.walker-hill.info/, Rytary.com, Vimpat.com    Thank you for trusting me with your health.      Take care,      Rosy Estabrook "Johnston Ebbs, MD  Co-Director  Movement Disorders Specialist  [facility]  [facility]    [email or web address]      [address]  [address]  [place] [address]    [place], [place] [address]  T [phone]  F [phone]   T [phone]  F [phone]

## 2017-07-06 NOTE — Progress Notes (Signed)
[email or web address][employer]fi    Subjective:      76 y/o M with hx HTN, HLD, borderline DM, glaucoma, prior CVA, presents to Movement Disorders clinic for f/u of possible Parkinson's Disease.     Over the last 3 months, has had complications including bladder tumor and surgery. He is now Now taking 2 capsules of Rytary 145mg  3x daily.  Tried going to 1 capsule 145mg  3x daily which made things worse. They are concerned that his walking isn't getting better. Still having freezing and leg getting stuck.     Hasn't been able to start PT/OT yet. Dealing with lots of issues.    Still having weak spells. Per family:    We have all observed the following changes:   - instances of falling has increased   - legs not moving when he wants them to   - legs jumping or stuttering when trying to walk   - speech significantly reduced   - increase tremors in lips   - increased difficulty feeding himself   - drooling     -------------------------------  History retained from initial consultation:    History is mainly from family. He was diagnosed with Parkinson's Disease in 2015, with initial symptoms being changes to his gait.  He was walking slower and his movements were slowed overall.  They report also noting tremors in the legs and hands, unclear laterality. He noticed the tremors throughout the day, but the daughter notices it more with activity, when feeding himself.     The diagnosis of Parkinson's was considered, so he was started on Sinemet, however did not take it because it gave him GI issues. During that year, he had a number of TIAs, but then in 2016 had a completed CVA.  Described a 'clot in the back of his head' by family.  Afterwards, he was having issues with speech and swallowing. Swallow study was normal.     At some point then, the dx of Parkinson's was questioned, but "tests" were completed that confirmed, though unclear what that was.  Was put back on Sinemet.  Once he is on Sinemet, his  walking and tightness improves, but they are concerned about progression as he is falling frequently now. No cognitive changes, just difficulty talking.    Weak spells happen in the morning, he tells family that he's weak. Can get to lie down, but doesn't feel dizzy, woozy or lightheaded.  He feels that he starts to feel weak after the medicine.     Last complete PT in December last year. He has completed SLT which helped.    + uncontrollable laughing and crying noted, depression/anxiety and fatigue.     No family history of Parkinson's Disease or tremor. No history significant head injury or LOC.  No history of neuroleptic or chemical exposure.     The following portions of the patient's history were reviewed and updated as appropriate: allergies, current medications, past medical history, past social history, past surgical history and problem list.    Review of Systems  + vocal changes, gait disorder.  No recent illness. Denies fever, chills, cough, sinus pain, eye pain, eye redness, ear pain, rhinorrhea, sore throat, chest pain, SOB, wheezing, abd pain, Nausea, Vomiting, diarrhea, constipation, dysuria, or rashes.  All other systems reviewed and are negative except as previously noted in the HPI.    Objective:     Vitals:    07/06/17 0937   BP: 114/74   Pulse: 69  Constitutional: NAD   Eyes: Clear conjunctiva  Cardiovascular: RRR  Respiratory: CTA B   Musculoskeletal: stooped posture, normal muscle bulk.  Integumentary: No abnormal rash noted  Psych: Flat affect, decreased blink rate.    Neurological:   Mental Status: Alert & oriented to person, place, month, & year.   Language: Very dysarthric. Difficult to understand.    Cranial Nerves:  II, III: PERRL  III, IV, VI: Mild limitation to vertical gaze, No nystagmus, no palsies, no ptosis  V: Intact to LT V1-V3 distribution bilaterally.   VI: Symmetrical face and expression.   VIII: Hearing intact to finger rub bilaterally.   IX, X: Palate/Uvula elevates  symmetrically.   XI: 5/5 Trapezius & SCM bilaterally.   XII: Tongue is midline.     Motor Exam: Strength 5/5 throughout and symmetric.    Sensation: Sensation to LT intact grossly through symmetrically. Romberg negative.    Cerebellar:   Finger to Nose: intact bilaterally with mild terminal dysmetria on FTN.    Reflexes: 2+ throughout, symmetric, with down-going toes. Negative Hoffman bilaterally.     Station/ Gait: Narrow based, short stepped.  Mild Shuffle.  Absent arm swing bilaterally.  Stooped. Some freezing evident.     Tone minimally increased throughout, improved from prior, though fine motor use of both hands is still slowed to RAM, finger tapping and open/close, R>L.  Foot tapping also reduced in this laterality.     Assessment:     76 y/o M with hx HTN, HLD, borderline DM, glaucoma, prior CVA, presents to Movement Disorders clinic for f/u of Parkinson's Disease. By history, exam as above, positive DATscan reported, this does fit with a diagnosis of Parkinson's Disease, but there is significant overlay from his prior stroke. It is unclear what symptoms are CVA related and which are PD related.  To that end, he is applying secondary stroke prevention agents, so we will try to improve his Parkinson's medication delivery to see what improves.     His gait continues to be his main issue, and it is likely being affected due to multiple reasons, PD, prior stroke and the stressors of his recent surgery, bladder tumor, etc.  We discussed the impact on symptoms that stress and medical issues such as his most recent ones can have.  This makes it very difficult to track symptom response to medication adjustments.    That being said, he is still showing right side lateralizing Parkinson's symptoms and his gait does appear other dose.  To see if we can get improvement by adjusting dopamine, we will change.  Rytary to be 3 capsules of 145, 3 times a day.  If he continues to have spells during the day of sudden weakness,  this could mean that the medication is not lasting as long as it should, and then will change to 4 times a day about 5-6 hours apart.    We discussed Parkinson's at length, including the motor and non-motor symptoms, prognosis, possibilities for progression, treatment options, and expectations. The goal for long-term functional stability is to achieve predictability, consistency, and smoothness of medication delivery.  We also discussed the need to stay physically and mentally active.     He is also to begin BIG PT, followed by LOUD SLT, all home PT due to inability to travel safely. after his bladder procedure.  Referral given.     Plan:     1) Parkinson's Disease - severe, chronic progressing condition.  - Change Rytary to 3 capsules  145mg  TID; low threshold to change to 4x daily if needed.     2) Gait disorder - multifactorial; PD, comorbid medical issues, prior CVA.   - Begin BIG PT and OT.     3) Dysarthria -   - Begin LOUD SLT after PT.    4) Prior CVA - continue Plavix and statin for secondary stroke prevention.  - Loop recorder implanted currently.     RTC in 2 mo. Patient can follow up sooner if needed. In the meantime, patient will contact the office with any questions or concerns.     -----------------------------------------------------------    Elenore Paddy, MD  Co-Director  Movement Disorders Specialist  [facility]  [facility]    [address]  [address]  [place] [address]    [place], [place] [address]  T [phone]  F [phone]   T [phone]  F [phone]     [email or web address]

## 2017-07-10 DIAGNOSIS — Z6824 Body mass index (BMI) 24.0-24.9, adult: Secondary | ICD-10-CM | POA: Diagnosis not present

## 2017-07-10 DIAGNOSIS — C679 Malignant neoplasm of bladder, unspecified: Secondary | ICD-10-CM | POA: Diagnosis not present

## 2017-07-16 ENCOUNTER — Ambulatory Visit (INDEPENDENT_AMBULATORY_CARE_PROVIDER_SITE_OTHER): Payer: Medicare HMO | Admitting: *Deleted

## 2017-07-16 DIAGNOSIS — I639 Cerebral infarction, unspecified: Secondary | ICD-10-CM

## 2017-07-17 NOTE — Progress Notes (Signed)
Carelink Summary Report / Loop Recorder 

## 2017-07-18 DIAGNOSIS — G20 Parkinson's disease: Secondary | ICD-10-CM | POA: Diagnosis not present

## 2017-07-18 DIAGNOSIS — Z6824 Body mass index (BMI) 24.0-24.9, adult: Secondary | ICD-10-CM | POA: Diagnosis not present

## 2017-07-18 DIAGNOSIS — I10 Essential (primary) hypertension: Secondary | ICD-10-CM | POA: Diagnosis not present

## 2017-07-18 DIAGNOSIS — C679 Malignant neoplasm of bladder, unspecified: Secondary | ICD-10-CM | POA: Diagnosis not present

## 2017-07-18 DIAGNOSIS — I2699 Other pulmonary embolism without acute cor pulmonale: Secondary | ICD-10-CM | POA: Diagnosis not present

## 2017-07-21 LAB — CUP PACEART REMOTE DEVICE CHECK
Date Time Interrogation Session: 20180819123948
Implantable Pulse Generator Implant Date: 20170922

## 2017-07-21 NOTE — Progress Notes (Signed)
Carelink summary report received. Battery status OK. Normal device function. No new symptom episodes, tachy episodes, brady, or pause episodes. No new AF episodes. Monthly summary reports and ROV/PRN 

## 2017-07-26 ENCOUNTER — Telehealth: Payer: Self-pay | Admitting: Cardiology

## 2017-07-26 NOTE — Telephone Encounter (Signed)
Spoke w/ pt wife and requested that pt send a remote transmission w/the  home monitor b/c the home monitor has not updated in at least 14 days. She stated that pt home monitor is in [place] and patient won't be back there for another week or so. Informed pt wife to send the transmission as soon as they get back to [place]. Pt wife verbalized understanding.

## 2017-08-08 ENCOUNTER — Encounter: Payer: Self-pay | Admitting: Cardiology

## 2017-08-13 DIAGNOSIS — C679 Malignant neoplasm of bladder, unspecified: Secondary | ICD-10-CM | POA: Diagnosis not present

## 2017-08-14 ENCOUNTER — Encounter: Payer: Medicare HMO | Admitting: *Deleted

## 2017-08-20 DIAGNOSIS — C679 Malignant neoplasm of bladder, unspecified: Secondary | ICD-10-CM | POA: Diagnosis not present

## 2017-08-27 DIAGNOSIS — C679 Malignant neoplasm of bladder, unspecified: Secondary | ICD-10-CM | POA: Diagnosis not present

## 2017-09-03 DIAGNOSIS — C679 Malignant neoplasm of bladder, unspecified: Secondary | ICD-10-CM | POA: Diagnosis not present

## 2017-09-05 ENCOUNTER — Telehealth: Payer: Self-pay | Admitting: Cardiology

## 2017-09-05 NOTE — Telephone Encounter (Signed)
LMOVM requesting that pt send manual transmission b/c home monitor has not updated in at least 14 days.    

## 2017-09-06 ENCOUNTER — Ambulatory Visit (INDEPENDENT_AMBULATORY_CARE_PROVIDER_SITE_OTHER): Payer: Medicare (Managed Care) | Admitting: Neurology

## 2017-09-06 ENCOUNTER — Encounter (INDEPENDENT_AMBULATORY_CARE_PROVIDER_SITE_OTHER): Payer: Self-pay | Admitting: Neurology

## 2017-09-06 VITALS — BP 147/84 | HR 63

## 2017-09-06 DIAGNOSIS — R270 Ataxia, unspecified: Secondary | ICD-10-CM

## 2017-09-06 DIAGNOSIS — I639 Cerebral infarction, unspecified: Secondary | ICD-10-CM

## 2017-09-06 DIAGNOSIS — R471 Dysarthria and anarthria: Secondary | ICD-10-CM

## 2017-09-06 DIAGNOSIS — G20 Parkinson's disease: Secondary | ICD-10-CM

## 2017-09-06 MED ORDER — CARBIDOPA-LEVODOPA ER 36.25-145 MG PO CPCR
3.0000 | ORAL_CAPSULE | Freq: Three times a day (TID) | ORAL | 3 refills | Status: DC
Start: 2017-09-06 — End: 2017-09-06

## 2017-09-06 MED ORDER — CARBIDOPA-LEVODOPA ER 36.25-145 MG PO CPCR
3.0000 | ORAL_CAPSULE | Freq: Three times a day (TID) | ORAL | 3 refills | Status: DC
Start: 2017-09-06 — End: 2018-02-25

## 2017-09-06 NOTE — Progress Notes (Signed)
[email or web address]    Subjective:      76 y/o M with hx HTN, HLD, borderline DM, glaucoma, prior CVA, presents to Movement Disorders clinic for f/u of possible Parkinson's Disease.     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  Upped Rytary to 3 capsules TID, with improved 'draining from mouth', voice is louder, and arms improved.  Still with voice issues and gait issues. This is significant. Freezing still present. They do not believe his gait is any better, and his condition still fluctuates greatly, as does his voice. No falls though.    -------------------------------  History retained from initial consultation:    History is mainly from family. He was diagnosed with Parkinson's Disease in 2015, with initial symptoms being changes to his gait.  He was walking slower and his movements were slowed overall.  They report also noting tremors in the legs and hands, unclear laterality. He noticed the tremors throughout the day, but the daughter notices it more with activity, when feeding himself.     The diagnosis of Parkinson's was considered, so he was started on Sinemet, however did not take it because it gave him GI issues. During that year, he had a number of TIAs, but then in 2016 had a completed CVA.  Described a 'clot in the back of his head' by family.  Afterwards, he was having issues with speech and swallowing. Swallow study was normal.     At some point then, the dx of Parkinson's was questioned, but "tests" were completed that confirmed, though unclear what that was.  Was put back on Sinemet.  Once he is on Sinemet, his walking and tightness improves, but they are concerned about progression as he is falling frequently now. No cognitive changes, just difficulty talking.    Weak spells happen in the morning, he tells family that he's weak. Can get to lie down, but doesn't feel dizzy, woozy or lightheaded.  He feels that he starts to feel  weak after the medicine.     Last complete PT in December last year. He has completed SLT which helped.    + uncontrollable laughing and crying noted, depression/anxiety and fatigue.     No family history of Parkinson's Disease or tremor. No history significant head injury or LOC.  No history of neuroleptic or chemical exposure.     The following portions of the patient's history were reviewed and updated as appropriate: allergies, current medications, past medical history, past social history, past surgical history and problem list.    Review of Systems  + vocal changes, gait disorder.  No recent illness. Denies fever, chills, cough, sinus pain, eye pain, eye redness, ear pain, rhinorrhea, sore throat, chest pain, SOB, wheezing, abd pain, Nausea, Vomiting, diarrhea, constipation, dysuria, or rashes.  All other systems reviewed and are negative except as previously noted in the HPI.    Objective:     Vitals:    09/06/17 1134   BP: 147/84   Pulse: 63     Constitutional: NAD   Eyes: Clear conjunctiva  Cardiovascular: RRR  Respiratory: CTA B   Musculoskeletal: stooped posture, normal muscle bulk.  Integumentary: No abnormal rash noted  Psych: Flat affect, decreased blink rate.    Neurological:   Mental Status: Alert & oriented to person, place, month, & year.   Language: Very dysarthric. Difficult to understand.  Improved in volume and understanding compared to last visit.     Cranial Nerves:  II, III: PERRL  III, IV, VI: Mild limitation to vertical gaze, No nystagmus, no palsies, no ptosis  V: Intact to LT V1-V3 distribution bilaterally.   VI: Symmetrical face and expression.   VIII: Hearing intact to finger rub bilaterally.   IX, X: Palate/Uvula elevates symmetrically.   XI: 5/5 Trapezius & SCM bilaterally.   XII: Tongue is midline.     Motor Exam: Strength 5/5 throughout and symmetric.    Sensation: Sensation to LT intact grossly through symmetrically. Romberg negative.    Cerebellar:   Finger to Nose: intact  bilaterally with mild terminal dysmetria on FTN.    Reflexes: 2+ throughout, symmetric, with down-going toes. Negative Hoffman bilaterally.     Station/ Gait: Wider based, short stepped.  Shuffle.  Absent arm swing bilaterally, arms out.  Stooped. Some freezing evident.     Tone minimally increased throughout, improved from prior now really normalized.  Fine motor use of both hands is slowed to RAM, finger tapping and open/close, R>L but more fluid with no freezing.  Foot tapping also reduced in this laterality.     Assessment:     76 y/o M with hx HTN, HLD, borderline DM, glaucoma, prior CVA, presents to Movement Disorders clinic for f/u of Parkinson's Disease. 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 It is unclear what symptoms are CVA related and which are PD related.  To that end, he is applying secondary stroke prevention agents, so we will try to improve his Parkinson's medication delivery to see what improves.     Now at a solid dose of Rytary, and most if not all of his Parkinsonian features have improved (fine motor use of hands, voice, gait to a degree), But what remains are symptoms that can be attributed to a stroke.  This is his wider based, short stepped, unsteady gait, as well as his cerebellar speech pattern.  We discussed the best course of action for this would be physical therapy, occupational therapy and speech therapy, which I have referred.  Overlying all this is that his symptoms are likely being worsened and fluctuating related to the treatment for his bladder tumor.  He will likely improve once his treatments are complete.    We discussed Parkinson's at length, including the motor and non-motor symptoms, prognosis, possibilities for progression, treatment options, and expectations. The goal for long-term functional stability is to achieve  predictability, consistency, and smoothness of medication delivery.  We also discussed the need to stay physically and mentally active.     Plan:     1) Parkinson's Disease -   - Continue Rytary to 3 capsules 145mg  TID; low threshold to change to 4x daily if needed.     2) Gait disorder - multifactorial; PD, comorbid medical issues, prior CVA.  Severe, chronic progressing condition.   - Begin BIG PT and OT.     3) Dysarthria -   - Begin LOUD SLT after PT.    4) Prior CVA - continue Plavix and statin for secondary stroke prevention.  - Loop recorder implanted currently.     RTC in 2 mo. Patient can follow up sooner if needed. In the meantime, patient will contact the office with any questions or concerns.     -----------------------------------------------------------    Elenore Paddy, MD  Director, Movement Disorders Specialist  [facility]  [facility]    [address]  [address]  [place] [address]    [place], [place] [address]  T [phone]  F [phone]   T [phone]  F [phone]     [email or web address]

## 2017-09-06 NOTE — Patient Instructions (Addendum)
[facility]    Our plan:     1) Continue Rytary 3 capsules 145mg  3x daily.    2) Begin BIG physical therapy and LOUD speech therapy.     Return to clinic in 3 months.    Today's Visit:      In today's visit I reviewed your medications and records relating your health - prior testing, blood work, reports of other health care providers present in your electronic medical record.     If you have records from non-[facility] doctors please send to [place] or bring to next office visit.     A copy of today's visit will be sent to your referring doctor and/or primary care doctor.    Let me know if there are things we could have done better for your office visit.    Patient satisfaction survey:      If you receive a patient satisfaction survey, I would greatly appreciate it if you would complete it.     We are like a car dealer where we aim for a score of 9 or 10.  If you had an experience that was less than a 9 or 10, please let me know so we can improve. If you had a good experience today, please let us know too.  We value your feedback.     Contact me online:      Patient Portal online - Please sign up for MyChart -- this is the best way to communicate me.  There is a messaging feature which can send messages directly to my inbox.  It is the best way to communicate with me and get test results, medication refills or ask questions.     You can expect to get a response within 24-48 hours during weekdays - if you do not, resend your request and inform us we did not respond. My goal is for every question answered as soon as possible. Average response for a phone call is 3 days due to the volume we receive (which is why MyChart is preferred).    MyChart should not be used if you are having a medical emergency -- call 911 in that case.     Coupons for medication:      If you would like to see if samples or vouchers available for your medicines please consider checking online.  Most medicines have web sites  where you can print coupons or vouchers for you co-pay.     For example: AutomobileBuzz.is,  DSLRemote.se, Namendaxr.com, http://www.walker-hill.info/, [email or web address], [email or web address]    Thank you for trusting me with your health.      Take care,      Juanya Villavicencio "Johnston Ebbs, MD  Director, Movement Disorders Specialist   [facility]  [facility]    [email or web address]      [address]  [address]  [place] [address]    [place], [place] [address]  T [phone]  F [phone]   T [phone]  F [phone]

## 2017-09-10 DIAGNOSIS — C679 Malignant neoplasm of bladder, unspecified: Secondary | ICD-10-CM | POA: Diagnosis not present

## 2017-09-13 ENCOUNTER — Ambulatory Visit (INDEPENDENT_AMBULATORY_CARE_PROVIDER_SITE_OTHER): Payer: Medicare HMO | Admitting: *Deleted

## 2017-09-13 DIAGNOSIS — I639 Cerebral infarction, unspecified: Secondary | ICD-10-CM

## 2017-09-14 ENCOUNTER — Encounter: Payer: Self-pay | Admitting: Cardiology

## 2017-09-14 NOTE — Progress Notes (Signed)
Carelink Summary Report / Loop Recorder 

## 2017-09-16 DIAGNOSIS — M25472 Effusion, left ankle: Secondary | ICD-10-CM | POA: Diagnosis not present

## 2017-09-17 DIAGNOSIS — C679 Malignant neoplasm of bladder, unspecified: Secondary | ICD-10-CM | POA: Diagnosis not present

## 2017-09-17 DIAGNOSIS — N529 Male erectile dysfunction, unspecified: Secondary | ICD-10-CM | POA: Diagnosis not present

## 2017-09-17 LAB — CUP PACEART REMOTE DEVICE CHECK
Date Time Interrogation Session: 20181018134016
Implantable Pulse Generator Implant Date: 20170922

## 2017-09-24 DIAGNOSIS — Z6825 Body mass index (BMI) 25.0-25.9, adult: Secondary | ICD-10-CM | POA: Diagnosis not present

## 2017-09-24 DIAGNOSIS — R600 Localized edema: Secondary | ICD-10-CM | POA: Diagnosis not present

## 2017-09-24 DIAGNOSIS — M79672 Pain in left foot: Secondary | ICD-10-CM | POA: Diagnosis not present

## 2017-10-01 ENCOUNTER — Encounter (INDEPENDENT_AMBULATORY_CARE_PROVIDER_SITE_OTHER): Payer: Self-pay | Admitting: Neurology

## 2017-10-01 DIAGNOSIS — Z8673 Personal history of transient ischemic attack (TIA), and cerebral infarction without residual deficits: Secondary | ICD-10-CM | POA: Diagnosis not present

## 2017-10-01 DIAGNOSIS — G253 Myoclonus: Secondary | ICD-10-CM | POA: Diagnosis not present

## 2017-10-01 DIAGNOSIS — R4701 Aphasia: Secondary | ICD-10-CM | POA: Diagnosis not present

## 2017-10-02 ENCOUNTER — Other Ambulatory Visit (INDEPENDENT_AMBULATORY_CARE_PROVIDER_SITE_OTHER): Payer: Self-pay | Admitting: Neurology

## 2017-10-02 DIAGNOSIS — G20 Parkinson's disease: Secondary | ICD-10-CM

## 2017-10-08 DIAGNOSIS — M79672 Pain in left foot: Secondary | ICD-10-CM | POA: Diagnosis not present

## 2017-10-08 DIAGNOSIS — M25572 Pain in left ankle and joints of left foot: Secondary | ICD-10-CM | POA: Diagnosis not present

## 2017-10-08 DIAGNOSIS — Z6826 Body mass index (BMI) 26.0-26.9, adult: Secondary | ICD-10-CM | POA: Diagnosis not present

## 2017-10-15 ENCOUNTER — Ambulatory Visit (INDEPENDENT_AMBULATORY_CARE_PROVIDER_SITE_OTHER): Payer: Medicare HMO | Admitting: *Deleted

## 2017-10-15 DIAGNOSIS — I639 Cerebral infarction, unspecified: Secondary | ICD-10-CM

## 2017-10-15 NOTE — Progress Notes (Signed)
Carelink Summary Report / Loop Recorder 

## 2017-10-22 DIAGNOSIS — R490 Dysphonia: Secondary | ICD-10-CM | POA: Diagnosis not present

## 2017-10-22 DIAGNOSIS — G20 Parkinson's disease: Secondary | ICD-10-CM | POA: Diagnosis not present

## 2017-10-22 DIAGNOSIS — I639 Cerebral infarction, unspecified: Secondary | ICD-10-CM | POA: Diagnosis not present

## 2017-10-22 DIAGNOSIS — I69322 Dysarthria following cerebral infarction: Secondary | ICD-10-CM | POA: Diagnosis not present

## 2017-10-23 DIAGNOSIS — G20 Parkinson's disease: Secondary | ICD-10-CM | POA: Diagnosis not present

## 2017-10-23 DIAGNOSIS — I639 Cerebral infarction, unspecified: Secondary | ICD-10-CM | POA: Diagnosis not present

## 2017-10-23 DIAGNOSIS — I69322 Dysarthria following cerebral infarction: Secondary | ICD-10-CM | POA: Diagnosis not present

## 2017-10-23 DIAGNOSIS — R490 Dysphonia: Secondary | ICD-10-CM | POA: Diagnosis not present

## 2017-10-24 DIAGNOSIS — R490 Dysphonia: Secondary | ICD-10-CM | POA: Diagnosis not present

## 2017-10-24 DIAGNOSIS — I69322 Dysarthria following cerebral infarction: Secondary | ICD-10-CM | POA: Diagnosis not present

## 2017-10-24 DIAGNOSIS — G20 Parkinson's disease: Secondary | ICD-10-CM | POA: Diagnosis not present

## 2017-10-24 DIAGNOSIS — I639 Cerebral infarction, unspecified: Secondary | ICD-10-CM | POA: Diagnosis not present

## 2017-10-25 ENCOUNTER — Telehealth: Payer: Self-pay | Admitting: Cardiology

## 2017-10-25 DIAGNOSIS — I69322 Dysarthria following cerebral infarction: Secondary | ICD-10-CM | POA: Diagnosis not present

## 2017-10-25 DIAGNOSIS — G20 Parkinson's disease: Secondary | ICD-10-CM | POA: Diagnosis not present

## 2017-10-25 DIAGNOSIS — R490 Dysphonia: Secondary | ICD-10-CM | POA: Diagnosis not present

## 2017-10-25 DIAGNOSIS — I639 Cerebral infarction, unspecified: Secondary | ICD-10-CM | POA: Diagnosis not present

## 2017-10-25 NOTE — Telephone Encounter (Signed)
LMOVM requesting that pt send manual transmission b/c home monitor has not updated in at least 14 days.    

## 2017-10-26 DIAGNOSIS — G20 Parkinson's disease: Secondary | ICD-10-CM | POA: Diagnosis not present

## 2017-10-26 DIAGNOSIS — I69322 Dysarthria following cerebral infarction: Secondary | ICD-10-CM | POA: Diagnosis not present

## 2017-10-26 DIAGNOSIS — I639 Cerebral infarction, unspecified: Secondary | ICD-10-CM | POA: Diagnosis not present

## 2017-10-26 DIAGNOSIS — R490 Dysphonia: Secondary | ICD-10-CM | POA: Diagnosis not present

## 2017-10-29 DIAGNOSIS — G20 Parkinson's disease: Secondary | ICD-10-CM | POA: Diagnosis not present

## 2017-10-29 DIAGNOSIS — I639 Cerebral infarction, unspecified: Secondary | ICD-10-CM | POA: Diagnosis not present

## 2017-10-29 DIAGNOSIS — I69322 Dysarthria following cerebral infarction: Secondary | ICD-10-CM | POA: Diagnosis not present

## 2017-10-29 DIAGNOSIS — R490 Dysphonia: Secondary | ICD-10-CM | POA: Diagnosis not present

## 2017-10-30 DIAGNOSIS — I69322 Dysarthria following cerebral infarction: Secondary | ICD-10-CM | POA: Diagnosis not present

## 2017-10-30 DIAGNOSIS — I639 Cerebral infarction, unspecified: Secondary | ICD-10-CM | POA: Diagnosis not present

## 2017-10-30 DIAGNOSIS — R490 Dysphonia: Secondary | ICD-10-CM | POA: Diagnosis not present

## 2017-10-30 DIAGNOSIS — G20 Parkinson's disease: Secondary | ICD-10-CM | POA: Diagnosis not present

## 2017-10-31 ENCOUNTER — Encounter: Payer: Self-pay | Admitting: Cardiology

## 2017-11-01 DIAGNOSIS — G20 Parkinson's disease: Secondary | ICD-10-CM | POA: Diagnosis not present

## 2017-11-01 DIAGNOSIS — R490 Dysphonia: Secondary | ICD-10-CM | POA: Diagnosis not present

## 2017-11-01 DIAGNOSIS — I639 Cerebral infarction, unspecified: Secondary | ICD-10-CM | POA: Diagnosis not present

## 2017-11-01 DIAGNOSIS — I69322 Dysarthria following cerebral infarction: Secondary | ICD-10-CM | POA: Diagnosis not present

## 2017-11-01 LAB — CUP PACEART REMOTE DEVICE CHECK
Date Time Interrogation Session: 20181117151159
Implantable Pulse Generator Implant Date: 20170922

## 2017-11-02 DIAGNOSIS — G20 Parkinson's disease: Secondary | ICD-10-CM | POA: Diagnosis not present

## 2017-11-02 DIAGNOSIS — R490 Dysphonia: Secondary | ICD-10-CM | POA: Diagnosis not present

## 2017-11-02 DIAGNOSIS — I69322 Dysarthria following cerebral infarction: Secondary | ICD-10-CM | POA: Diagnosis not present

## 2017-11-02 DIAGNOSIS — I639 Cerebral infarction, unspecified: Secondary | ICD-10-CM | POA: Diagnosis not present

## 2017-11-06 DIAGNOSIS — G20 Parkinson's disease: Secondary | ICD-10-CM | POA: Diagnosis not present

## 2017-11-06 DIAGNOSIS — R490 Dysphonia: Secondary | ICD-10-CM | POA: Diagnosis not present

## 2017-11-06 DIAGNOSIS — I69322 Dysarthria following cerebral infarction: Secondary | ICD-10-CM | POA: Diagnosis not present

## 2017-11-06 DIAGNOSIS — I639 Cerebral infarction, unspecified: Secondary | ICD-10-CM | POA: Diagnosis not present

## 2017-11-07 DIAGNOSIS — I639 Cerebral infarction, unspecified: Secondary | ICD-10-CM | POA: Diagnosis not present

## 2017-11-07 DIAGNOSIS — G20 Parkinson's disease: Secondary | ICD-10-CM | POA: Diagnosis not present

## 2017-11-07 DIAGNOSIS — R490 Dysphonia: Secondary | ICD-10-CM | POA: Diagnosis not present

## 2017-11-07 DIAGNOSIS — I69322 Dysarthria following cerebral infarction: Secondary | ICD-10-CM | POA: Diagnosis not present

## 2017-11-08 DIAGNOSIS — I69322 Dysarthria following cerebral infarction: Secondary | ICD-10-CM | POA: Diagnosis not present

## 2017-11-08 DIAGNOSIS — R490 Dysphonia: Secondary | ICD-10-CM | POA: Diagnosis not present

## 2017-11-08 DIAGNOSIS — G20 Parkinson's disease: Secondary | ICD-10-CM | POA: Diagnosis not present

## 2017-11-08 DIAGNOSIS — I639 Cerebral infarction, unspecified: Secondary | ICD-10-CM | POA: Diagnosis not present

## 2017-11-09 ENCOUNTER — Encounter: Payer: Self-pay | Admitting: Cardiology

## 2017-11-09 DIAGNOSIS — G20 Parkinson's disease: Secondary | ICD-10-CM | POA: Diagnosis not present

## 2017-11-09 DIAGNOSIS — I69322 Dysarthria following cerebral infarction: Secondary | ICD-10-CM | POA: Diagnosis not present

## 2017-11-09 DIAGNOSIS — R490 Dysphonia: Secondary | ICD-10-CM | POA: Diagnosis not present

## 2017-11-09 DIAGNOSIS — I639 Cerebral infarction, unspecified: Secondary | ICD-10-CM | POA: Diagnosis not present

## 2017-11-12 DIAGNOSIS — R490 Dysphonia: Secondary | ICD-10-CM | POA: Diagnosis not present

## 2017-11-12 DIAGNOSIS — G20 Parkinson's disease: Secondary | ICD-10-CM | POA: Diagnosis not present

## 2017-11-12 DIAGNOSIS — I69322 Dysarthria following cerebral infarction: Secondary | ICD-10-CM | POA: Diagnosis not present

## 2017-11-12 DIAGNOSIS — I639 Cerebral infarction, unspecified: Secondary | ICD-10-CM | POA: Diagnosis not present

## 2017-11-13 DIAGNOSIS — I69322 Dysarthria following cerebral infarction: Secondary | ICD-10-CM | POA: Diagnosis not present

## 2017-11-13 DIAGNOSIS — R490 Dysphonia: Secondary | ICD-10-CM | POA: Diagnosis not present

## 2017-11-13 DIAGNOSIS — I639 Cerebral infarction, unspecified: Secondary | ICD-10-CM | POA: Diagnosis not present

## 2017-11-13 DIAGNOSIS — G20 Parkinson's disease: Secondary | ICD-10-CM | POA: Diagnosis not present

## 2017-11-14 ENCOUNTER — Encounter: Payer: Medicare HMO | Admitting: *Deleted

## 2017-11-14 DIAGNOSIS — I639 Cerebral infarction, unspecified: Secondary | ICD-10-CM | POA: Diagnosis not present

## 2017-11-14 DIAGNOSIS — I69322 Dysarthria following cerebral infarction: Secondary | ICD-10-CM | POA: Diagnosis not present

## 2017-11-14 DIAGNOSIS — G20 Parkinson's disease: Secondary | ICD-10-CM | POA: Diagnosis not present

## 2017-11-14 DIAGNOSIS — R490 Dysphonia: Secondary | ICD-10-CM | POA: Diagnosis not present

## 2017-11-15 DIAGNOSIS — I69322 Dysarthria following cerebral infarction: Secondary | ICD-10-CM | POA: Diagnosis not present

## 2017-11-15 DIAGNOSIS — G20 Parkinson's disease: Secondary | ICD-10-CM | POA: Diagnosis not present

## 2017-11-15 DIAGNOSIS — I639 Cerebral infarction, unspecified: Secondary | ICD-10-CM | POA: Diagnosis not present

## 2017-11-15 DIAGNOSIS — R490 Dysphonia: Secondary | ICD-10-CM | POA: Diagnosis not present

## 2017-11-30 DIAGNOSIS — C679 Malignant neoplasm of bladder, unspecified: Secondary | ICD-10-CM | POA: Diagnosis not present

## 2017-11-30 DIAGNOSIS — Z6826 Body mass index (BMI) 26.0-26.9, adult: Secondary | ICD-10-CM | POA: Diagnosis not present

## 2017-11-30 DIAGNOSIS — Z23 Encounter for immunization: Secondary | ICD-10-CM | POA: Diagnosis not present

## 2017-12-12 ENCOUNTER — Ambulatory Visit (INDEPENDENT_AMBULATORY_CARE_PROVIDER_SITE_OTHER): Payer: Medicare HMO | Admitting: *Deleted

## 2017-12-12 DIAGNOSIS — I639 Cerebral infarction, unspecified: Secondary | ICD-10-CM | POA: Diagnosis not present

## 2017-12-13 NOTE — Progress Notes (Signed)
Carelink Summary Report / Loop Recorder 

## 2017-12-28 LAB — CUP PACEART REMOTE DEVICE CHECK
Date Time Interrogation Session: 20190116154221
Implantable Pulse Generator Implant Date: 20170922

## 2018-01-03 DIAGNOSIS — E669 Obesity, unspecified: Secondary | ICD-10-CM | POA: Diagnosis not present

## 2018-01-03 DIAGNOSIS — C672 Malignant neoplasm of lateral wall of bladder: Secondary | ICD-10-CM | POA: Diagnosis not present

## 2018-01-03 DIAGNOSIS — R9341 Abnormal radiologic findings on diagnostic imaging of renal pelvis, ureter, or bladder: Secondary | ICD-10-CM | POA: Diagnosis not present

## 2018-01-03 DIAGNOSIS — Z7901 Long term (current) use of anticoagulants: Secondary | ICD-10-CM | POA: Diagnosis not present

## 2018-01-03 DIAGNOSIS — G20 Parkinson's disease: Secondary | ICD-10-CM | POA: Diagnosis not present

## 2018-01-03 DIAGNOSIS — N3020 Other chronic cystitis without hematuria: Secondary | ICD-10-CM | POA: Diagnosis not present

## 2018-01-03 DIAGNOSIS — I10 Essential (primary) hypertension: Secondary | ICD-10-CM | POA: Diagnosis not present

## 2018-01-03 DIAGNOSIS — C679 Malignant neoplasm of bladder, unspecified: Secondary | ICD-10-CM | POA: Diagnosis not present

## 2018-01-03 DIAGNOSIS — Z6826 Body mass index (BMI) 26.0-26.9, adult: Secondary | ICD-10-CM | POA: Diagnosis not present

## 2018-01-03 DIAGNOSIS — E785 Hyperlipidemia, unspecified: Secondary | ICD-10-CM | POA: Diagnosis not present

## 2018-01-03 DIAGNOSIS — N400 Enlarged prostate without lower urinary tract symptoms: Secondary | ICD-10-CM | POA: Diagnosis not present

## 2018-01-03 DIAGNOSIS — N261 Atrophy of kidney (terminal): Secondary | ICD-10-CM | POA: Diagnosis not present

## 2018-01-03 DIAGNOSIS — Z86711 Personal history of pulmonary embolism: Secondary | ICD-10-CM | POA: Diagnosis not present

## 2018-01-03 DIAGNOSIS — Z8551 Personal history of malignant neoplasm of bladder: Secondary | ICD-10-CM | POA: Diagnosis not present

## 2018-01-03 DIAGNOSIS — N3289 Other specified disorders of bladder: Secondary | ICD-10-CM | POA: Diagnosis not present

## 2018-01-11 ENCOUNTER — Ambulatory Visit (INDEPENDENT_AMBULATORY_CARE_PROVIDER_SITE_OTHER): Payer: Medicare HMO | Admitting: *Deleted

## 2018-01-11 DIAGNOSIS — I639 Cerebral infarction, unspecified: Secondary | ICD-10-CM

## 2018-01-12 DIAGNOSIS — Z6826 Body mass index (BMI) 26.0-26.9, adult: Secondary | ICD-10-CM | POA: Diagnosis not present

## 2018-01-12 DIAGNOSIS — N3001 Acute cystitis with hematuria: Secondary | ICD-10-CM | POA: Diagnosis not present

## 2018-01-14 NOTE — Progress Notes (Signed)
Carelink Summary Report / Loop Recorder 

## 2018-01-18 ENCOUNTER — Ambulatory Visit (INDEPENDENT_AMBULATORY_CARE_PROVIDER_SITE_OTHER): Payer: PRIVATE HEALTH INSURANCE | Admitting: Neurology

## 2018-01-25 ENCOUNTER — Encounter (INDEPENDENT_AMBULATORY_CARE_PROVIDER_SITE_OTHER): Payer: Self-pay | Admitting: No Specialty

## 2018-01-28 ENCOUNTER — Encounter (INDEPENDENT_AMBULATORY_CARE_PROVIDER_SITE_OTHER): Payer: Self-pay

## 2018-01-28 NOTE — Progress Notes (Signed)
Rytary was approved until 11/26/2018.

## 2018-01-29 ENCOUNTER — Other Ambulatory Visit (INDEPENDENT_AMBULATORY_CARE_PROVIDER_SITE_OTHER): Payer: Self-pay

## 2018-01-29 DIAGNOSIS — G20 Parkinson's disease: Secondary | ICD-10-CM

## 2018-01-29 NOTE — Telephone Encounter (Signed)
rytary was approved yesterday

## 2018-02-05 ENCOUNTER — Encounter (INDEPENDENT_AMBULATORY_CARE_PROVIDER_SITE_OTHER): Payer: Self-pay

## 2018-02-05 DIAGNOSIS — C673 Malignant neoplasm of anterior wall of bladder: Secondary | ICD-10-CM | POA: Diagnosis not present

## 2018-02-05 DIAGNOSIS — Z5111 Encounter for antineoplastic chemotherapy: Secondary | ICD-10-CM | POA: Diagnosis not present

## 2018-02-07 LAB — CUP PACEART REMOTE DEVICE CHECK
Date Time Interrogation Session: 20190215164029
Implantable Pulse Generator Implant Date: 20170922

## 2018-02-12 DIAGNOSIS — Z5111 Encounter for antineoplastic chemotherapy: Secondary | ICD-10-CM | POA: Diagnosis not present

## 2018-02-12 DIAGNOSIS — C673 Malignant neoplasm of anterior wall of bladder: Secondary | ICD-10-CM | POA: Diagnosis not present

## 2018-02-13 ENCOUNTER — Ambulatory Visit (INDEPENDENT_AMBULATORY_CARE_PROVIDER_SITE_OTHER): Payer: Medicare HMO | Admitting: *Deleted

## 2018-02-13 DIAGNOSIS — I639 Cerebral infarction, unspecified: Secondary | ICD-10-CM | POA: Diagnosis not present

## 2018-02-14 NOTE — Progress Notes (Signed)
Carelink Summary Report / Loop Recorder 

## 2018-02-19 ENCOUNTER — Telehealth: Payer: Self-pay | Admitting: Cardiology

## 2018-02-19 DIAGNOSIS — C673 Malignant neoplasm of anterior wall of bladder: Secondary | ICD-10-CM | POA: Diagnosis not present

## 2018-02-19 DIAGNOSIS — Z5111 Encounter for antineoplastic chemotherapy: Secondary | ICD-10-CM | POA: Diagnosis not present

## 2018-02-19 NOTE — Telephone Encounter (Signed)
Spoke w/ pt and requested that he send a manual transmission b/c his home monitor has not updated in at least 14 days.   

## 2018-02-25 ENCOUNTER — Encounter (INDEPENDENT_AMBULATORY_CARE_PROVIDER_SITE_OTHER): Payer: Self-pay | Admitting: Neurology

## 2018-02-25 ENCOUNTER — Ambulatory Visit (INDEPENDENT_AMBULATORY_CARE_PROVIDER_SITE_OTHER): Payer: Medicare (Managed Care) | Admitting: Neurology

## 2018-02-25 VITALS — BP 141/89 | HR 63 | Ht 67.0 in | Wt 135.0 lb

## 2018-02-25 DIAGNOSIS — R471 Dysarthria and anarthria: Secondary | ICD-10-CM

## 2018-02-25 DIAGNOSIS — G20 Parkinson's disease: Secondary | ICD-10-CM

## 2018-02-25 DIAGNOSIS — R270 Ataxia, unspecified: Secondary | ICD-10-CM

## 2018-02-25 DIAGNOSIS — Z72820 Sleep deprivation: Secondary | ICD-10-CM

## 2018-02-25 DIAGNOSIS — I639 Cerebral infarction, unspecified: Secondary | ICD-10-CM

## 2018-02-25 DIAGNOSIS — F482 Pseudobulbar affect: Secondary | ICD-10-CM

## 2018-02-25 DIAGNOSIS — R69 Illness, unspecified: Secondary | ICD-10-CM | POA: Diagnosis not present

## 2018-02-25 MED ORDER — DEXTROMETHORPHAN-QUINIDINE 20-10 MG PO CAPS
1.0000 | ORAL_CAPSULE | Freq: Two times a day (BID) | ORAL | 3 refills | Status: DC
Start: 2018-02-25 — End: 2019-08-18

## 2018-02-25 MED ORDER — CARBIDOPA-LEVODOPA ER 36.25-145 MG PO CPCR
4.0000 | ORAL_CAPSULE | Freq: Three times a day (TID) | ORAL | 3 refills | Status: DC
Start: 2018-02-25 — End: 2018-04-26

## 2018-02-25 NOTE — Patient Instructions (Addendum)
[facility]    Our plan:     1) Begin melatonin 5-6mg  about an hour before bedtime.    2) Change Rytary to 3 capsules 4x daily about 5 hours apart.    3) Begin Nuedexta 1 capsule daily for 1 week, then change to 1 capsule 2x daily.     Return to clinic on May 31st at noon.    Today's Visit:      In today's visit I reviewed your medications and records relating your health - prior testing, blood work, reports of other health care providers present in your electronic medical record.     If you have records from non-[facility] doctors please send to [place] or bring to next office visit.     A copy of today's visit will be sent to your referring doctor and/or primary care doctor.    Let me know if there are things we could have done better for your office visit.    Patient satisfaction survey:      If you receive a patient satisfaction survey, I would greatly appreciate it if you would complete it.     We are like a car dealer where we aim for a score of 9 or 10.  If you had an experience that was less than a 9 or 10, please let me know so we can improve. If you had a good experience today, please let us know too.  We value your feedback.     Contact me online:      Patient Portal online - Please sign up for MyChart -- this is the best way to communicate me.  There is a messaging feature which can send messages directly to my inbox.  It is the best way to communicate with me and get test results, medication refills or ask questions.     You can expect to get a response within 24-48 hours during weekdays - if you do not, resend your request and inform us we did not respond. My goal is for every question answered as soon as possible. Average response for a phone call is 3 days due to the volume we receive (which is why MyChart is preferred).    MyChart should not be used if you are having a medical emergency -- call 911 in that case.     Coupons for medication:      If you would like to see if samples  or vouchers available for your medicines please consider checking online.  Most medicines have web sites where you can print coupons or vouchers for you co-pay.     For example: AutomobileBuzz.is,  DSLRemote.se, Namendaxr.com, http://www.walker-hill.info/, [email or web address], [email or web address]    Thank you for trusting me with your health.      Take care,      Porchea Charrier "Johnston Ebbs, MD  Director, Movement Disorders Specialist  [facility]  [facility]    [email or web address]      [address]  [address]  [place] [address]    [place], [place] [address]  T [phone]  F [phone]   T [phone]  F [phone]
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
## 2018-02-26 ENCOUNTER — Encounter: Payer: Self-pay | Admitting: Cardiology

## 2018-02-26 ENCOUNTER — Encounter (INDEPENDENT_AMBULATORY_CARE_PROVIDER_SITE_OTHER): Payer: Self-pay | Admitting: Neurology

## 2018-02-26 DIAGNOSIS — Z5111 Encounter for antineoplastic chemotherapy: Secondary | ICD-10-CM | POA: Diagnosis not present

## 2018-02-26 DIAGNOSIS — C673 Malignant neoplasm of anterior wall of bladder: Secondary | ICD-10-CM | POA: Diagnosis not present

## 2018-03-05 ENCOUNTER — Encounter (INDEPENDENT_AMBULATORY_CARE_PROVIDER_SITE_OTHER): Payer: Self-pay | Admitting: Neurology

## 2018-03-05 DIAGNOSIS — C673 Malignant neoplasm of anterior wall of bladder: Secondary | ICD-10-CM | POA: Diagnosis not present

## 2018-03-05 DIAGNOSIS — Z5111 Encounter for antineoplastic chemotherapy: Secondary | ICD-10-CM | POA: Diagnosis not present

## 2018-03-12 DIAGNOSIS — C673 Malignant neoplasm of anterior wall of bladder: Secondary | ICD-10-CM | POA: Diagnosis not present

## 2018-03-12 DIAGNOSIS — Z5111 Encounter for antineoplastic chemotherapy: Secondary | ICD-10-CM | POA: Diagnosis not present

## 2018-03-13 DIAGNOSIS — C679 Malignant neoplasm of bladder, unspecified: Secondary | ICD-10-CM | POA: Diagnosis not present

## 2018-03-13 DIAGNOSIS — Z6826 Body mass index (BMI) 26.0-26.9, adult: Secondary | ICD-10-CM | POA: Diagnosis not present

## 2018-03-13 DIAGNOSIS — I2699 Other pulmonary embolism without acute cor pulmonale: Secondary | ICD-10-CM | POA: Diagnosis not present

## 2018-03-13 DIAGNOSIS — I10 Essential (primary) hypertension: Secondary | ICD-10-CM | POA: Diagnosis not present

## 2018-03-13 DIAGNOSIS — G20 Parkinson's disease: Secondary | ICD-10-CM | POA: Diagnosis not present

## 2018-03-13 DIAGNOSIS — E782 Mixed hyperlipidemia: Secondary | ICD-10-CM | POA: Diagnosis not present

## 2018-03-14 ENCOUNTER — Encounter: Payer: Self-pay | Admitting: Cardiology

## 2018-03-18 ENCOUNTER — Ambulatory Visit: Payer: Medicare HMO | Admitting: *Deleted

## 2018-03-20 NOTE — Progress Notes (Signed)
Not received  

## 2018-03-24 LAB — CUP PACEART REMOTE DEVICE CHECK
Date Time Interrogation Session: 20190320193943
Implantable Pulse Generator Implant Date: 20170922

## 2018-04-03 ENCOUNTER — Telehealth: Payer: Self-pay | Admitting: Cardiology

## 2018-04-03 NOTE — Telephone Encounter (Signed)
LMOVM requesting that pt send manual transmission b/c home monitor has not updated in at least 14 days.    

## 2018-04-10 ENCOUNTER — Encounter: Payer: Self-pay | Admitting: Cardiology

## 2018-04-15 DIAGNOSIS — M25572 Pain in left ankle and joints of left foot: Secondary | ICD-10-CM | POA: Diagnosis not present

## 2018-04-15 DIAGNOSIS — S8002XA Contusion of left knee, initial encounter: Secondary | ICD-10-CM | POA: Diagnosis not present

## 2018-04-16 ENCOUNTER — Encounter (INDEPENDENT_AMBULATORY_CARE_PROVIDER_SITE_OTHER): Payer: Self-pay | Admitting: Neurology

## 2018-04-16 ENCOUNTER — Other Ambulatory Visit (INDEPENDENT_AMBULATORY_CARE_PROVIDER_SITE_OTHER): Payer: Self-pay | Admitting: Neurology

## 2018-04-16 DIAGNOSIS — G20 Parkinson's disease: Secondary | ICD-10-CM

## 2018-04-16 DIAGNOSIS — R270 Ataxia, unspecified: Secondary | ICD-10-CM

## 2018-04-16 DIAGNOSIS — R471 Dysarthria and anarthria: Secondary | ICD-10-CM

## 2018-04-17 ENCOUNTER — Encounter (INDEPENDENT_AMBULATORY_CARE_PROVIDER_SITE_OTHER): Payer: Self-pay

## 2018-04-17 DIAGNOSIS — Z79899 Other long term (current) drug therapy: Secondary | ICD-10-CM | POA: Diagnosis not present

## 2018-04-17 DIAGNOSIS — M25562 Pain in left knee: Secondary | ICD-10-CM | POA: Diagnosis not present

## 2018-04-17 DIAGNOSIS — S99912A Unspecified injury of left ankle, initial encounter: Secondary | ICD-10-CM | POA: Diagnosis not present

## 2018-04-17 DIAGNOSIS — I69928 Other speech and language deficits following unspecified cerebrovascular disease: Secondary | ICD-10-CM | POA: Diagnosis not present

## 2018-04-17 DIAGNOSIS — G20 Parkinson's disease: Secondary | ICD-10-CM | POA: Diagnosis not present

## 2018-04-17 DIAGNOSIS — S8992XA Unspecified injury of left lower leg, initial encounter: Secondary | ICD-10-CM | POA: Diagnosis not present

## 2018-04-17 DIAGNOSIS — I110 Hypertensive heart disease with heart failure: Secondary | ICD-10-CM | POA: Diagnosis not present

## 2018-04-17 DIAGNOSIS — I509 Heart failure, unspecified: Secondary | ICD-10-CM | POA: Diagnosis not present

## 2018-04-17 DIAGNOSIS — W19XXXA Unspecified fall, initial encounter: Secondary | ICD-10-CM | POA: Diagnosis not present

## 2018-04-17 DIAGNOSIS — I69954 Hemiplegia and hemiparesis following unspecified cerebrovascular disease affecting left non-dominant side: Secondary | ICD-10-CM | POA: Diagnosis not present

## 2018-04-17 DIAGNOSIS — M25572 Pain in left ankle and joints of left foot: Secondary | ICD-10-CM | POA: Diagnosis not present

## 2018-04-17 DIAGNOSIS — S8002XA Contusion of left knee, initial encounter: Secondary | ICD-10-CM | POA: Diagnosis not present

## 2018-04-17 DIAGNOSIS — M79672 Pain in left foot: Secondary | ICD-10-CM | POA: Diagnosis not present

## 2018-04-17 DIAGNOSIS — W1839XA Other fall on same level, initial encounter: Secondary | ICD-10-CM | POA: Diagnosis not present

## 2018-04-17 DIAGNOSIS — M7989 Other specified soft tissue disorders: Secondary | ICD-10-CM | POA: Diagnosis not present

## 2018-04-17 DIAGNOSIS — S93402A Sprain of unspecified ligament of left ankle, initial encounter: Secondary | ICD-10-CM | POA: Diagnosis not present

## 2018-04-26 ENCOUNTER — Ambulatory Visit (INDEPENDENT_AMBULATORY_CARE_PROVIDER_SITE_OTHER): Payer: Medicare (Managed Care) | Admitting: Neurology

## 2018-04-26 ENCOUNTER — Encounter (INDEPENDENT_AMBULATORY_CARE_PROVIDER_SITE_OTHER): Payer: Self-pay | Admitting: Neurology

## 2018-04-26 VITALS — BP 152/91 | HR 62 | Resp 16 | Ht 67.0 in | Wt 165.0 lb

## 2018-04-26 DIAGNOSIS — R270 Ataxia, unspecified: Secondary | ICD-10-CM

## 2018-04-26 DIAGNOSIS — R471 Dysarthria and anarthria: Secondary | ICD-10-CM

## 2018-04-26 DIAGNOSIS — I639 Cerebral infarction, unspecified: Secondary | ICD-10-CM

## 2018-04-26 DIAGNOSIS — G20 Parkinson's disease: Secondary | ICD-10-CM

## 2018-04-26 MED ORDER — CARBIDOPA-LEVODOPA ER 36.25-145 MG PO CPCR
3.00 | ORAL_CAPSULE | Freq: Four times a day (QID) | ORAL | 3 refills | Status: DC
Start: 2018-04-26 — End: 2019-06-05

## 2018-04-26 NOTE — Progress Notes (Signed)
[email or web address]    Subjective:      77 y/o M with hx HTN, HLD, borderline DM, glaucoma, prior CVA, presents to Movement Disorders clinic for f/u of possible Parkinson's Disease.     Since last visit has progressed, falling frequently. Injured his L knee and ankle. Having festination and freezing of gait, as well as impact of prior CVA. Did give a 2 week trial of Nuedexta since las visit, and showed remarkable response.  Emotionality was gone, walking was improved and was not falling.  Unfortunately had to come off due to insurance coverage.  Will try again.     Setting up PT/OT/SLT and asking for home health aid d/t falling. Showed great response to prior therapy.     Sleep is very poor. Seems to be sleeping at the wrong times.     -------------------------------  History retained from initial consultation:    History is mainly from family. He was diagnosed with Parkinson's Disease in 2015, with initial symptoms being changes to his gait.  He was walking slower and his movements were slowed overall.  They report also noting tremors in the legs and hands, unclear laterality. He noticed the tremors throughout the day, but the daughter notices it more with activity, when feeding himself.     The diagnosis of Parkinson's was considered, so he was started on Sinemet, however did not take it because it gave him GI issues. During that year, he had a number of TIAs, but then in 2016 had a completed CVA.  Described a 'clot in the back of his head' by family.  Afterwards, he was having issues with speech and swallowing. Swallow study was normal.     At some point then, the dx of Parkinson's was questioned, but "tests" were completed that confirmed, though unclear what that was.  Was put back on Sinemet.  Once he is on Sinemet, his walking and tightness improves, but they are concerned about progression as he is falling frequently now. No cognitive changes, just difficulty talking.    Weak spells  happen in the morning, he tells family that he's weak. Can get to lie down, but doesn't feel dizzy, woozy or lightheaded.  He feels that he starts to feel weak after the medicine.     Last complete PT in December last year. He has completed SLT which helped.    + uncontrollable laughing and crying noted, depression/anxiety and fatigue.     No family history of Parkinson's Disease or tremor. No history significant head injury or LOC.  No history of neuroleptic or chemical exposure.     Prior history reviewed:    Leaman Abe was seen today in the movement disorders clinic for neurologic consultation at the request of [name], MD. The consultation is for the evaluation of PD. No records are available to me from his prior neurologist. Therefore, all history was obtained from the patient and wife. He was previously being seen in [place], [place]. He was dx with with PD about 3 years ago and wife states that his first sx was was slow handwriting and slow to eat and slight jaw tremor. He was started on carbidopa/levodopa 10/100 tid and pt doesn't think that it helped. It was taking it at 8am/1pm/8-9pm. Lunchtime is at noon. He takes the last at bedtime. However, he admits that he has not taken it for months.    He had a stroke in May 2016 and he woke up and he was dizzy and called his  wife at work. When she got to him, he was just laying on the floor. They called 911. He was not on ASA at the time. He was put on plavix and lipitor. Speech has been biggest issue since the stroke. States that he never went through speech therapy, even when he was in rehab. He did physical therapy only. R leg remains a bit weak after stroke.    07/18/16 update: The patient is seen today in follow-up. I have reviewed many records from several hospitalizations and also his prior neurologist records since our last visit. He was first seen by his prior neurologist in [place] on 06/15/2014 with complaints of speech changes and dragging his  right leg and trouble buttoning his clothing. On examination, she reports no bradykinesia at that visit, but she reports that he had lip tremor. He was dragging his right leg and his rapid alternating movements were normal. She did an MRI of the brain just following that visit and this was done on 07/06/2014. There was a small acute right basal ganglia infarct. He followed up with her just after that in August, 2015 and was started on levodopa, 10/100 3 times per day, as she felt that his infarct was acute, whereas his symptoms were for 6 months and he had right leg symptoms and right sided infarct. 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 In September, 2015 the notes said that the patient noted significant improvement on aspirin and a statin. He was then hospitalized in May, 2016 and she followed up with him after that and said that there was just mild slurring of speech. On 07/19/2015 she did have him wearing an event monitor for one month that was negative. It does appear that he had been off of levodopa ever since his infarct in May, 2016. I was able to get his hospital records from [facility] from his infarction. He was admitted on 04/08/2015, but they reported left-sided weakness and dizziness. He had an echocardiogram on 04/09/2015 with an ejection fraction of 60-65%. An EEG was normal. An MRA of the neck was normal. An MRI of the brain stated that "DWI appears to demonstrate multiple punctate areas of high signal involving the cerebellar hemispheres and cerebellar vermis." I did have the opportunity to review these images once they became available, and DWI was in fact positive in both cerebellar hemispheres and amongst the vermis. His carotid ultrasound was negative. There were minimal notes regarding his speech but nothing stated that it was particularly slurred. He was then recently hospitalized once again more on 07/10/2016 and I again reviewed those records. He was hospitalized for  inability to swallow. Pt states that swallowing issues are with liquids and not solids because he was trying to take too much in the mouth at once. An MRI without gadolinium was done. I did not get those images. There was reported to be "a few old cerebellar infarctions." There is reported to be an old infarction in the left frontal region. DWI was negative. It was concluded that his swallowing issues were from Parkinson's disease and his refusal to take levodopa. He did have a barium swallow that was reported to show mild oropharyngeal dysphagia, but no dietary changes were made, with the exceptions of to take small bites and administer meds one at a time. He ended up having a very detailed EMG at our office following these hospitalizations to rule out motor neuron disease and there was no evidence of this.    09/08/16  update: Pt returns accompanied by his wife who supplements the history. Since our last visit the patient had a DaT scan in 07/2016 and it was abnormal demonstrating decreased uptake, being virtually absent in the bilateral caudate and the putamen. Wife does state that when he was last hospitalized, he had been given levodopa and it helped but was d/c in the hospital. He also saw Dr. Johney Frame and had a TEE on 08/18/16 and that was normal with the exception of evidence of a PFO. He had a LE doppler on 08/18/16 and that was normal. He had a loop recorder implanted on 08/18/16 and as of 08/28/16 there were no episodes of atrial fibrillation.       The following portions of the patient's history were reviewed and updated as appropriate: allergies, current medications, past medical history, past social history, past surgical history and problem list.    Review of Systems  + vocal changes, gait disorder.  No recent illness. Denies fever, chills, cough, sinus pain, eye pain, eye redness, ear pain, rhinorrhea, sore throat, chest pain, SOB, wheezing, abd pain, Nausea, Vomiting, diarrhea, constipation, dysuria, or  rashes.  All other systems reviewed and are negative except as previously noted in the HPI.    Objective:     Vitals:    04/26/18 0926   BP: (!) 152/91   Pulse: 62   Resp: 16     Constitutional: NAD   Eyes: Clear conjunctiva  Cardiovascular: RRR  Respiratory: CTA B   Musculoskeletal: stooped posture, normal muscle bulk.  Integumentary: No abnormal rash noted  Psych: Flat affect, decreased blink rate. Emotional at times, spontaneously crying.     Neurological:   Mental Status: Alert & oriented to person, place, month, & year.   Language: Very dysarthric. Difficult to understand.      Cranial Nerves:  II, III: PERRL  III, IV, VI: Mild limitation to vertical gaze, No nystagmus, no palsies, no ptosis  V: Intact to LT V1-V3 distribution bilaterally.   VI: Symmetrical face and expression.   VIII: Hearing intact to finger rub bilaterally.   IX, X: Palate/Uvula elevates symmetrically.   XI: 5/5 Trapezius & SCM bilaterally.   XII: Tongue is midline.     Motor Exam: Strength 5/5 throughout and symmetric.    Sensation: Sensation to LT intact grossly through symmetrically. Romberg negative.    Cerebellar:   Finger to Nose: intact bilaterally with mild terminal dysmetria on FTN.    Reflexes: 2+ throughout, symmetric, with down-going toes. Negative Hoffman bilaterally.     Station/ Gait: Wider based, short stepped.  Shuffle.  Absent arm swing bilaterally, arms out.  Stooped. Freezing evident.     Tone increased throughout.  Fine motor use of both hands is slowed to RAM, finger tapping and open/close, R>L but more fluid with no freezing.  Foot tapping also reduced in this laterality.     The following was discussed with patient:   Exercise -- even 20 minutes 3x per week makes a difference   Alcohol   Tobacco. Smoking is a major risk factor for cardiovascular disease   Diet   Consider PSG   Treat AF   Meds   Antiplatelet or anticoagulant for ischemic stroke. Risk, benefit, toxicity was discussed.    Statins reduce the risk  of risk. Risk, benefit, toxicity discussed.   Controlling your blood pressure is important   Signs and symptoms of stroke   Call 911 for stroke like symptoms    Since this patient  has had a well documented stroke or cardiovascular risk factors it is paramount that continuing secondary stroke prevention measures be applied and reinforced regularly.     EPWORTH SLEEPINESS SCALE  How Likely Are You to Fall Asleep    Sitting and reading?      1- Slight chance of dozing or sleeping  Watching TV?       1- Slight chance of dozing or sleeping  Sitting inactive in a Public Space?    1- Slight chance of dozing or sleeping  Being a passenger in a car for an hour or more?  1- Slight chance of dozing or sleeping  Sitting quietly after lunch (no alcohol)?   1- Slight chance of dozing or sleeping  Stopped for a few minutes in traffic while driving?  1- Slight chance of dozing or sleeping  Total Score       6   Score 0-9 = Not sleepy   Score 10-17  = Moderate sleepy   Score 18+  = Very sleepy    Assessment:     77 y/o M with hx HTN, HLD, borderline DM, glaucoma, prior CVA, presents to Movement Disorders clinic for f/u of Parkinson's Disease. 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 It is unclear what symptoms are CVA related and which are PD related.      He has significantly progressed from before, falling daily.  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 It is a combination of this plus the advancing Parkinson's disease, which is causing his postural instability. Overlying all this is that his symptoms are likely being worsened and fluctuating related to the treatment for his bladder tumor.      Plan will be to restart Nuedexta titrating as directed to BID as he responded so well in the past.  Will keep the higher  dose of Rytary unchanged at 3 capsules 4x daily about 5 hours apart.  Discussed dosing, treatment expectations and s/e of which to be aware. Answered all questions regarding this medication.    Also discussed the impact of his prior stroke and secondary stroke prevention, with screening completed as above.       Plan:     1) Parkinson's Disease -   - Continue Rytary to 3 capsules 145mg  4x daily, 5 hours apart.    2) Gait disorder - multifactorial; PD, comorbid medical issues, prior CVA.  Severe, chronic progressing condition.   - Begin PT/OT/SLT. HOME rehab of medical necessity d/t inability to travel safely, homebound status.   - Referred to home health for services.   - Discussed fall risk at length.     3) Dysarthria -   - Restart LOUD SLT exercises. And begin home SLT>    4) Prior CVA - continue Plavix and statin for secondary stroke prevention.  - screening as above.     5) Pseudobulbar affect (PBA) - begin Nuedexta titrating to 1 capsule BID as directed.    RTC in 3 mo. Patient can follow up sooner if needed. In the meantime, patient will contact the office with any questions or concerns.     -----------------------------------------------------------    Brian Paddy, MD  Director, Movement Disorders Specialist  [facility]  [facility]    [address]  [address]  [place] [address]    [place] [address]  T [phone]  F [phone]   T [phone]  F [phone]     [email or web address]

## 2018-05-06 DIAGNOSIS — S76312A Strain of muscle, fascia and tendon of the posterior muscle group at thigh level, left thigh, initial encounter: Secondary | ICD-10-CM | POA: Diagnosis not present

## 2018-05-06 DIAGNOSIS — R600 Localized edema: Secondary | ICD-10-CM | POA: Diagnosis not present

## 2018-05-07 DIAGNOSIS — I10 Essential (primary) hypertension: Secondary | ICD-10-CM | POA: Diagnosis not present

## 2018-05-07 DIAGNOSIS — G20 Parkinson's disease: Secondary | ICD-10-CM | POA: Diagnosis not present

## 2018-05-07 DIAGNOSIS — M6281 Muscle weakness (generalized): Secondary | ICD-10-CM | POA: Diagnosis not present

## 2018-05-08 ENCOUNTER — Encounter: Payer: Medicare HMO | Admitting: Internal Medicine

## 2018-05-09 DIAGNOSIS — C673 Malignant neoplasm of anterior wall of bladder: Secondary | ICD-10-CM | POA: Diagnosis not present

## 2018-05-10 DIAGNOSIS — I10 Essential (primary) hypertension: Secondary | ICD-10-CM | POA: Diagnosis not present

## 2018-05-10 DIAGNOSIS — M6281 Muscle weakness (generalized): Secondary | ICD-10-CM | POA: Diagnosis not present

## 2018-05-10 DIAGNOSIS — G20 Parkinson's disease: Secondary | ICD-10-CM | POA: Diagnosis not present

## 2018-05-15 DIAGNOSIS — I10 Essential (primary) hypertension: Secondary | ICD-10-CM | POA: Diagnosis not present

## 2018-05-15 DIAGNOSIS — G20 Parkinson's disease: Secondary | ICD-10-CM | POA: Diagnosis not present

## 2018-05-15 DIAGNOSIS — M6281 Muscle weakness (generalized): Secondary | ICD-10-CM | POA: Diagnosis not present

## 2018-05-16 DIAGNOSIS — M6281 Muscle weakness (generalized): Secondary | ICD-10-CM | POA: Diagnosis not present

## 2018-05-16 DIAGNOSIS — I10 Essential (primary) hypertension: Secondary | ICD-10-CM | POA: Diagnosis not present

## 2018-05-16 DIAGNOSIS — G20 Parkinson's disease: Secondary | ICD-10-CM | POA: Diagnosis not present

## 2018-05-17 DIAGNOSIS — M6281 Muscle weakness (generalized): Secondary | ICD-10-CM | POA: Diagnosis not present

## 2018-05-17 DIAGNOSIS — I10 Essential (primary) hypertension: Secondary | ICD-10-CM | POA: Diagnosis not present

## 2018-05-17 DIAGNOSIS — G20 Parkinson's disease: Secondary | ICD-10-CM | POA: Diagnosis not present

## 2018-05-20 DIAGNOSIS — M6281 Muscle weakness (generalized): Secondary | ICD-10-CM | POA: Diagnosis not present

## 2018-05-20 DIAGNOSIS — G20 Parkinson's disease: Secondary | ICD-10-CM | POA: Diagnosis not present

## 2018-05-20 DIAGNOSIS — I10 Essential (primary) hypertension: Secondary | ICD-10-CM | POA: Diagnosis not present

## 2018-05-22 DIAGNOSIS — M25511 Pain in right shoulder: Secondary | ICD-10-CM | POA: Diagnosis not present

## 2018-05-22 DIAGNOSIS — M6281 Muscle weakness (generalized): Secondary | ICD-10-CM | POA: Diagnosis not present

## 2018-05-22 DIAGNOSIS — Z882 Allergy status to sulfonamides status: Secondary | ICD-10-CM | POA: Diagnosis not present

## 2018-05-22 DIAGNOSIS — M79662 Pain in left lower leg: Secondary | ICD-10-CM | POA: Diagnosis not present

## 2018-05-22 DIAGNOSIS — G20 Parkinson's disease: Secondary | ICD-10-CM | POA: Diagnosis not present

## 2018-05-22 DIAGNOSIS — Z885 Allergy status to narcotic agent status: Secondary | ICD-10-CM | POA: Diagnosis not present

## 2018-05-22 DIAGNOSIS — I10 Essential (primary) hypertension: Secondary | ICD-10-CM | POA: Diagnosis not present

## 2018-05-22 DIAGNOSIS — Z881 Allergy status to other antibiotic agents status: Secondary | ICD-10-CM | POA: Diagnosis not present

## 2018-05-22 DIAGNOSIS — R600 Localized edema: Secondary | ICD-10-CM | POA: Diagnosis not present

## 2018-05-22 DIAGNOSIS — Z8673 Personal history of transient ischemic attack (TIA), and cerebral infarction without residual deficits: Secondary | ICD-10-CM | POA: Diagnosis not present

## 2018-05-24 DIAGNOSIS — I10 Essential (primary) hypertension: Secondary | ICD-10-CM | POA: Diagnosis not present

## 2018-05-24 DIAGNOSIS — M6281 Muscle weakness (generalized): Secondary | ICD-10-CM | POA: Diagnosis not present

## 2018-05-24 DIAGNOSIS — G20 Parkinson's disease: Secondary | ICD-10-CM | POA: Diagnosis not present

## 2018-05-26 DIAGNOSIS — G20 Parkinson's disease: Secondary | ICD-10-CM | POA: Diagnosis not present

## 2018-05-26 DIAGNOSIS — I10 Essential (primary) hypertension: Secondary | ICD-10-CM | POA: Diagnosis not present

## 2018-05-26 DIAGNOSIS — M6281 Muscle weakness (generalized): Secondary | ICD-10-CM | POA: Diagnosis not present

## 2018-05-28 DIAGNOSIS — G20 Parkinson's disease: Secondary | ICD-10-CM | POA: Diagnosis not present

## 2018-05-28 DIAGNOSIS — I10 Essential (primary) hypertension: Secondary | ICD-10-CM | POA: Diagnosis not present

## 2018-05-28 DIAGNOSIS — M6281 Muscle weakness (generalized): Secondary | ICD-10-CM | POA: Diagnosis not present

## 2018-05-29 DIAGNOSIS — M6281 Muscle weakness (generalized): Secondary | ICD-10-CM | POA: Diagnosis not present

## 2018-05-29 DIAGNOSIS — G20 Parkinson's disease: Secondary | ICD-10-CM | POA: Diagnosis not present

## 2018-05-29 DIAGNOSIS — I10 Essential (primary) hypertension: Secondary | ICD-10-CM | POA: Diagnosis not present

## 2018-05-31 DIAGNOSIS — I10 Essential (primary) hypertension: Secondary | ICD-10-CM | POA: Diagnosis not present

## 2018-05-31 DIAGNOSIS — G20 Parkinson's disease: Secondary | ICD-10-CM | POA: Diagnosis not present

## 2018-05-31 DIAGNOSIS — M6281 Muscle weakness (generalized): Secondary | ICD-10-CM | POA: Diagnosis not present

## 2018-06-03 DIAGNOSIS — G20 Parkinson's disease: Secondary | ICD-10-CM | POA: Diagnosis not present

## 2018-06-03 DIAGNOSIS — M6281 Muscle weakness (generalized): Secondary | ICD-10-CM | POA: Diagnosis not present

## 2018-06-03 DIAGNOSIS — I10 Essential (primary) hypertension: Secondary | ICD-10-CM | POA: Diagnosis not present

## 2018-06-04 DIAGNOSIS — G20 Parkinson's disease: Secondary | ICD-10-CM | POA: Diagnosis not present

## 2018-06-04 DIAGNOSIS — I10 Essential (primary) hypertension: Secondary | ICD-10-CM | POA: Diagnosis not present

## 2018-06-04 DIAGNOSIS — M6281 Muscle weakness (generalized): Secondary | ICD-10-CM | POA: Diagnosis not present

## 2018-06-05 DIAGNOSIS — I10 Essential (primary) hypertension: Secondary | ICD-10-CM | POA: Diagnosis not present

## 2018-06-05 DIAGNOSIS — M6281 Muscle weakness (generalized): Secondary | ICD-10-CM | POA: Diagnosis not present

## 2018-06-05 DIAGNOSIS — G20 Parkinson's disease: Secondary | ICD-10-CM | POA: Diagnosis not present

## 2018-06-06 DIAGNOSIS — G20 Parkinson's disease: Secondary | ICD-10-CM | POA: Diagnosis not present

## 2018-06-06 DIAGNOSIS — I10 Essential (primary) hypertension: Secondary | ICD-10-CM | POA: Diagnosis not present

## 2018-06-06 DIAGNOSIS — M6281 Muscle weakness (generalized): Secondary | ICD-10-CM | POA: Diagnosis not present

## 2018-06-07 DIAGNOSIS — G20 Parkinson's disease: Secondary | ICD-10-CM | POA: Diagnosis not present

## 2018-06-07 DIAGNOSIS — I10 Essential (primary) hypertension: Secondary | ICD-10-CM | POA: Diagnosis not present

## 2018-06-07 DIAGNOSIS — M6281 Muscle weakness (generalized): Secondary | ICD-10-CM | POA: Diagnosis not present

## 2018-06-10 DIAGNOSIS — M6281 Muscle weakness (generalized): Secondary | ICD-10-CM | POA: Diagnosis not present

## 2018-06-10 DIAGNOSIS — G20 Parkinson's disease: Secondary | ICD-10-CM | POA: Diagnosis not present

## 2018-06-10 DIAGNOSIS — I10 Essential (primary) hypertension: Secondary | ICD-10-CM | POA: Diagnosis not present

## 2018-06-12 DIAGNOSIS — I10 Essential (primary) hypertension: Secondary | ICD-10-CM | POA: Diagnosis not present

## 2018-06-12 DIAGNOSIS — M6281 Muscle weakness (generalized): Secondary | ICD-10-CM | POA: Diagnosis not present

## 2018-06-12 DIAGNOSIS — G20 Parkinson's disease: Secondary | ICD-10-CM | POA: Diagnosis not present

## 2018-06-13 ENCOUNTER — Encounter (INDEPENDENT_AMBULATORY_CARE_PROVIDER_SITE_OTHER): Payer: Self-pay | Admitting: Neurology

## 2018-06-13 DIAGNOSIS — I10 Essential (primary) hypertension: Secondary | ICD-10-CM | POA: Diagnosis not present

## 2018-06-13 DIAGNOSIS — G20 Parkinson's disease: Secondary | ICD-10-CM | POA: Diagnosis not present

## 2018-06-13 DIAGNOSIS — M6281 Muscle weakness (generalized): Secondary | ICD-10-CM | POA: Diagnosis not present

## 2018-06-17 DIAGNOSIS — M6281 Muscle weakness (generalized): Secondary | ICD-10-CM | POA: Diagnosis not present

## 2018-06-17 DIAGNOSIS — I10 Essential (primary) hypertension: Secondary | ICD-10-CM | POA: Diagnosis not present

## 2018-06-17 DIAGNOSIS — G20 Parkinson's disease: Secondary | ICD-10-CM | POA: Diagnosis not present

## 2018-06-18 DIAGNOSIS — M6281 Muscle weakness (generalized): Secondary | ICD-10-CM | POA: Diagnosis not present

## 2018-06-18 DIAGNOSIS — G20 Parkinson's disease: Secondary | ICD-10-CM | POA: Diagnosis not present

## 2018-06-18 DIAGNOSIS — I10 Essential (primary) hypertension: Secondary | ICD-10-CM | POA: Diagnosis not present

## 2018-06-19 DIAGNOSIS — I10 Essential (primary) hypertension: Secondary | ICD-10-CM | POA: Diagnosis not present

## 2018-06-19 DIAGNOSIS — M6281 Muscle weakness (generalized): Secondary | ICD-10-CM | POA: Diagnosis not present

## 2018-06-19 DIAGNOSIS — G20 Parkinson's disease: Secondary | ICD-10-CM | POA: Diagnosis not present

## 2018-06-20 DIAGNOSIS — I10 Essential (primary) hypertension: Secondary | ICD-10-CM | POA: Diagnosis not present

## 2018-06-20 DIAGNOSIS — M6281 Muscle weakness (generalized): Secondary | ICD-10-CM | POA: Diagnosis not present

## 2018-06-20 DIAGNOSIS — G20 Parkinson's disease: Secondary | ICD-10-CM | POA: Diagnosis not present

## 2018-06-21 DIAGNOSIS — G20 Parkinson's disease: Secondary | ICD-10-CM | POA: Diagnosis not present

## 2018-06-21 DIAGNOSIS — M6281 Muscle weakness (generalized): Secondary | ICD-10-CM | POA: Diagnosis not present

## 2018-06-21 DIAGNOSIS — I10 Essential (primary) hypertension: Secondary | ICD-10-CM | POA: Diagnosis not present

## 2018-06-23 DIAGNOSIS — I10 Essential (primary) hypertension: Secondary | ICD-10-CM | POA: Diagnosis not present

## 2018-06-23 DIAGNOSIS — M6281 Muscle weakness (generalized): Secondary | ICD-10-CM | POA: Diagnosis not present

## 2018-06-23 DIAGNOSIS — G20 Parkinson's disease: Secondary | ICD-10-CM | POA: Diagnosis not present

## 2018-06-24 DIAGNOSIS — G20 Parkinson's disease: Secondary | ICD-10-CM | POA: Diagnosis not present

## 2018-06-24 DIAGNOSIS — M6281 Muscle weakness (generalized): Secondary | ICD-10-CM | POA: Diagnosis not present

## 2018-06-24 DIAGNOSIS — I10 Essential (primary) hypertension: Secondary | ICD-10-CM | POA: Diagnosis not present

## 2018-06-26 DIAGNOSIS — M6281 Muscle weakness (generalized): Secondary | ICD-10-CM | POA: Diagnosis not present

## 2018-06-26 DIAGNOSIS — G20 Parkinson's disease: Secondary | ICD-10-CM | POA: Diagnosis not present

## 2018-06-26 DIAGNOSIS — I10 Essential (primary) hypertension: Secondary | ICD-10-CM | POA: Diagnosis not present

## 2018-06-27 DIAGNOSIS — I10 Essential (primary) hypertension: Secondary | ICD-10-CM | POA: Diagnosis not present

## 2018-06-27 DIAGNOSIS — G20 Parkinson's disease: Secondary | ICD-10-CM | POA: Diagnosis not present

## 2018-06-27 DIAGNOSIS — M6281 Muscle weakness (generalized): Secondary | ICD-10-CM | POA: Diagnosis not present

## 2018-06-28 DIAGNOSIS — I10 Essential (primary) hypertension: Secondary | ICD-10-CM | POA: Diagnosis not present

## 2018-06-28 DIAGNOSIS — G20 Parkinson's disease: Secondary | ICD-10-CM | POA: Diagnosis not present

## 2018-06-28 DIAGNOSIS — M6281 Muscle weakness (generalized): Secondary | ICD-10-CM | POA: Diagnosis not present

## 2018-07-01 ENCOUNTER — Telehealth: Payer: Self-pay | Admitting: Internal Medicine

## 2018-07-01 DIAGNOSIS — M6281 Muscle weakness (generalized): Secondary | ICD-10-CM | POA: Diagnosis not present

## 2018-07-01 DIAGNOSIS — G20 Parkinson's disease: Secondary | ICD-10-CM | POA: Diagnosis not present

## 2018-07-01 DIAGNOSIS — I10 Essential (primary) hypertension: Secondary | ICD-10-CM | POA: Diagnosis not present

## 2018-07-01 NOTE — Telephone Encounter (Signed)
Pt's wife is calling today to ask about pt's linq monitor. I advised pt's wife I would forward her request to the device team.  Secondly, pt has been experiencing some SOB while ambulating. Pt will discuss this next week with Dr Rayann Heman. If his SOB worsens, he will discuss with his PCP.

## 2018-07-01 NOTE — Telephone Encounter (Signed)
LVM for return call. 

## 2018-07-01 NOTE — Telephone Encounter (Signed)
New message    Pt c/o Shortness Of Breath: STAT if SOB developed within the last 24 hours or pt is noticeably SOB on the phone  1. Are you currently SOB (can you hear that pt is SOB on the phone)? No , per spouse  2. How long have you been experiencing SOB? 3 days  3. Are you SOB when sitting or when up moving around? Moving around  4. Are you currently experiencing any other symptoms? no

## 2018-07-01 NOTE — Telephone Encounter (Signed)
LMOVM requesting call back to the [facility].  Patient was unenrolled from [facility] monitoring after multiple attempts to reach patient regarding disconnected monitor.  Certified letter was returned signed, monitor remained disconnected.  If patient wishes to resume [employer] monitoring, will need to re-enroll in [employer].  Need monitor serial number to do so.

## 2018-07-01 NOTE — Telephone Encounter (Signed)
Pt's wife returning call to [employer]

## 2018-07-02 DIAGNOSIS — I10 Essential (primary) hypertension: Secondary | ICD-10-CM | POA: Diagnosis not present

## 2018-07-02 DIAGNOSIS — M6281 Muscle weakness (generalized): Secondary | ICD-10-CM | POA: Diagnosis not present

## 2018-07-02 DIAGNOSIS — G20 Parkinson's disease: Secondary | ICD-10-CM | POA: Diagnosis not present

## 2018-07-03 DIAGNOSIS — M6281 Muscle weakness (generalized): Secondary | ICD-10-CM | POA: Diagnosis not present

## 2018-07-03 DIAGNOSIS — G20 Parkinson's disease: Secondary | ICD-10-CM | POA: Diagnosis not present

## 2018-07-03 DIAGNOSIS — I10 Essential (primary) hypertension: Secondary | ICD-10-CM | POA: Diagnosis not present

## 2018-07-04 DIAGNOSIS — G20 Parkinson's disease: Secondary | ICD-10-CM | POA: Diagnosis not present

## 2018-07-04 DIAGNOSIS — M6281 Muscle weakness (generalized): Secondary | ICD-10-CM | POA: Diagnosis not present

## 2018-07-04 DIAGNOSIS — I10 Essential (primary) hypertension: Secondary | ICD-10-CM | POA: Diagnosis not present

## 2018-07-05 DIAGNOSIS — I10 Essential (primary) hypertension: Secondary | ICD-10-CM | POA: Diagnosis not present

## 2018-07-05 DIAGNOSIS — G20 Parkinson's disease: Secondary | ICD-10-CM | POA: Diagnosis not present

## 2018-07-05 DIAGNOSIS — M6281 Muscle weakness (generalized): Secondary | ICD-10-CM | POA: Diagnosis not present

## 2018-07-05 NOTE — Telephone Encounter (Signed)
Discussed with [employer] tech services--counters include old episodes (previously reviewed tachy episodes in 04/2017), no new episodes.

## 2018-07-05 NOTE — Telephone Encounter (Signed)
Spoke with patient's wife, Hoyle Sauer. Able to successfully re-enroll patient in [employer] monitoring. Assisted with sending manual transmission for review. No available episode ECGs, will confirm no episodes with tech services.  Hoyle Sauer aware of patient's upcoming appointment with Dr. Rayann Heman and aware of [employer] monitor instructions. Encouraged her to call back with any questions or concerns and she verbalizes understanding.

## 2018-07-08 DIAGNOSIS — G20 Parkinson's disease: Secondary | ICD-10-CM | POA: Diagnosis not present

## 2018-07-08 DIAGNOSIS — M6281 Muscle weakness (generalized): Secondary | ICD-10-CM | POA: Diagnosis not present

## 2018-07-08 DIAGNOSIS — I10 Essential (primary) hypertension: Secondary | ICD-10-CM | POA: Diagnosis not present

## 2018-07-09 DIAGNOSIS — I10 Essential (primary) hypertension: Secondary | ICD-10-CM | POA: Diagnosis not present

## 2018-07-09 DIAGNOSIS — G20 Parkinson's disease: Secondary | ICD-10-CM | POA: Diagnosis not present

## 2018-07-09 DIAGNOSIS — M6281 Muscle weakness (generalized): Secondary | ICD-10-CM | POA: Diagnosis not present

## 2018-07-10 DIAGNOSIS — I10 Essential (primary) hypertension: Secondary | ICD-10-CM | POA: Diagnosis not present

## 2018-07-10 DIAGNOSIS — M6281 Muscle weakness (generalized): Secondary | ICD-10-CM | POA: Diagnosis not present

## 2018-07-10 DIAGNOSIS — G20 Parkinson's disease: Secondary | ICD-10-CM | POA: Diagnosis not present

## 2018-07-11 DIAGNOSIS — I10 Essential (primary) hypertension: Secondary | ICD-10-CM | POA: Diagnosis not present

## 2018-07-11 DIAGNOSIS — M6281 Muscle weakness (generalized): Secondary | ICD-10-CM | POA: Diagnosis not present

## 2018-07-11 DIAGNOSIS — G20 Parkinson's disease: Secondary | ICD-10-CM | POA: Diagnosis not present

## 2018-07-12 DIAGNOSIS — G20 Parkinson's disease: Secondary | ICD-10-CM | POA: Diagnosis not present

## 2018-07-12 DIAGNOSIS — M6281 Muscle weakness (generalized): Secondary | ICD-10-CM | POA: Diagnosis not present

## 2018-07-12 DIAGNOSIS — I10 Essential (primary) hypertension: Secondary | ICD-10-CM | POA: Diagnosis not present

## 2018-07-15 ENCOUNTER — Ambulatory Visit (INDEPENDENT_AMBULATORY_CARE_PROVIDER_SITE_OTHER): Payer: Medicare HMO | Admitting: Internal Medicine

## 2018-07-15 ENCOUNTER — Encounter: Payer: Self-pay | Admitting: Internal Medicine

## 2018-07-15 VITALS — BP 146/88 | HR 57 | Ht 67.0 in | Wt 166.8 lb

## 2018-07-15 DIAGNOSIS — G20 Parkinson's disease: Secondary | ICD-10-CM | POA: Diagnosis not present

## 2018-07-15 DIAGNOSIS — I10 Essential (primary) hypertension: Secondary | ICD-10-CM | POA: Diagnosis not present

## 2018-07-15 DIAGNOSIS — I639 Cerebral infarction, unspecified: Secondary | ICD-10-CM | POA: Diagnosis not present

## 2018-07-15 DIAGNOSIS — R0602 Shortness of breath: Secondary | ICD-10-CM

## 2018-07-15 DIAGNOSIS — M6281 Muscle weakness (generalized): Secondary | ICD-10-CM | POA: Diagnosis not present

## 2018-07-15 NOTE — Progress Notes (Signed)
PCP: [name], MD Primary Cardiologist: none Primary EP: Dr Garnett Farm is a 77 y.o. male who presents today for routine electrophysiology followup.  He is s/p ILR for cryptogenic stroke.  No afib has been detected.  He continues to have residual stroke deficit and also has parkinson's.  He has recently had SOB and wheezing.  + stable L ankle edema --> reports multiple prior dopplers not showing DVT.   Today, he denies symptoms of palpitations, chest pain,  dizziness, presyncope, or syncope.  The patient is otherwise without complaint today.   Past Medical History:  Diagnosis Date  . Anticoagulant long-term use    PLAVIX  . Benign localized prostatic hyperplasia with lower urinary tract symptoms (LUTS)   . Bladder tumor   . Borderline diabetes    watches diet  . ED (erectile dysfunction) of organic origin   . Gait disturbance, post-stroke    uses wheelchair long distance  . Glaucoma, right eye   . History of bleeding peptic ulcer    03/ 1979  s/p  surgery  . History of concussion    1987 no loc-- no residual  . History of CVA with residual deficit    05/ 2016 CYPTOGENIC CVA --- RESIDUAL GAIT DISTURBANCE AND SIALORRHEA ACCESS (DROOLING)  . Hyperlipidemia   . Hypertension   . Mild atherosclerosis of both carotid arteries    per duplex from outside facitlity 04-08-2015 less then 50% stenosis bilateral ICA  . Parkinson's disease [facility]) neurologist -- dr r. Mitzi Hansen falconer ([place] [facility], [facility])   dx 2015  . PBA (pseudobulbar affect)    laughter-- residual from cva 05/ 2016  . PFO (patent foramen ovale)    per TEE 08-18-2016  . Speech impairment    post-stroke  . Status post placement of implantable loop recorder 08/18/2016   placed by dr Adir Schicker  . Wears glasses    Past Surgical History:  Procedure Laterality Date  . APPENDECTOMY  1978  . CATARACT EXTRACTION W/ INTRAOCULAR LENS IMPLANT Right 2014 approx.  . CYSTOSCOPY W/ URETERAL  STENT PLACEMENT Bilateral 05/07/2017   Procedure: CYSTOSCOPY WITH RETROGRADE PYELOGRAM/URETERAL;  Surgeon: Cleon Gustin, MD;  Location: [facility];  Service: Urology;  Laterality: Bilateral;  . EP IMPLANTABLE DEVICE N/A 08/18/2016   Procedure: Loop Recorder Insertion;  Surgeon: Thompson Grayer, MD;  Location: [facility];  Service: Cardiovascular;  Laterality: N/A;  . REMOVAL NODULES BILATERAL FLANK  2008 approx.   per pt benign fatty tissue  . REPAIR OF PERFORATED ULCER  01/1978   bleeding peptic ulcer  . TEE WITHOUT CARDIOVERSION N/A 08/18/2016   Procedure: TRANSESOPHAGEAL ECHOCARDIOGRAM (TEE);  Surgeon: Sueanne Margarita, MD;  Location[facility];  Service: Cardiovascular;  Laterality: N/A; no evidence thrombus;  mobile atrial septum w/ positive bubble study consistent with PFO/ trivial AR, mild TR  . TRANSTHORACIC ECHOCARDIOGRAM  04/09/2015   ef 44-81%, grade 1 diastolic dysfunction/  mild AV sclerosis without stenosis/  mild TR,  trivial MR and PR  . TRANSURETHRAL RESECTION OF BLADDER TUMOR N/A 05/07/2017   Procedure: TRANSURETHRAL RESECTION OF BLADDER TUMOR (TURBT);  Surgeon: Cleon Gustin, MD;  Location: [facility];  Service: Urology;  Laterality: N/A;  . TRANSURETHRAL RESECTION OF BLADDER TUMOR N/A 06/18/2017   Procedure: TRANSURETHRAL RESECTION OF BLADDER TUMOR (TURBT);  Surgeon: Cleon Gustin, MD;  Location: [facility];  Service: Urology;  Laterality: N/A;  . TRANSURETHRAL RESECTION OF PROSTATE  08-20-2013   at [facility] are reviewed and negatives except as per HPI above  Current Outpatient Medications  Medication Sig Dispense Refill  . atorvastatin (LIPITOR) 40 MG tablet Take 40 mg by mouth every evening.    . Carbidopa-Levodopa ER (RYTARY) 36.25-145 MG CPCR Take 2 capsules by mouth 3 (three) times daily.     . clopidogrel (PLAVIX) 75 MG tablet Take 75 mg by mouth daily.     Marland Kitchen HYDROcodone-acetaminophen (NORCO/VICODIN)  5-325 MG tablet Take 1-2 tablets by mouth every 4 (four) hours as needed for moderate pain. 30 tablet 0  . hyoscyamine (LEVSIN SL) 0.125 MG SL tablet Take 1 tablet (0.125 mg total) by mouth every 4 (four) hours as needed for cramping. 30 tablet 0  . metoprolol succinate (TOPROL-XL) 100 MG 24 hr tablet Take 100 mg by mouth every evening.     . mirabegron ER (MYRBETRIQ) 25 MG TB24 tablet Take 1 tablet (25 mg total) by mouth daily. 30 tablet 0  . mirabegron ER (MYRBETRIQ) 50 MG TB24 tablet Take 50 mg by mouth daily.    . phenazopyridine (PYRIDIUM) 100 MG tablet Take 1 tablet (100 mg total) by mouth 3 (three) times daily as needed for pain. 20 tablet 0  . Travoprost, BAK Free, (TRAVATAN) 0.004 % SOLN ophthalmic solution Place 1 drop into both eyes at bedtime.     . meclizine (ANTIVERT) 25 MG tablet Take 25 mg by mouth every 6 (six) hours as needed.  2  . NUEDEXTA 20-10 MG CAPS Take 1 capsule by mouth 2 (two) times daily.  3   No current facility-administered medications for this visit.     Physical Exam: Vitals:   07/15/18 0953  BP: (!) 146/88  Pulse: (!) 57  SpO2: 98%  Weight: 166 lb 12.8 oz (75.7 kg)  Height: 5\' 7"  (1.702 m)    GEN- The patient is well appearing, alert and oriented x 3 today.   Head- normocephalic, atraumatic Eyes-  Sclera clear, conjunctiva pink Ears- hearing intact Oropharynx- clear Lungs- Clear to ausculation bilaterally, normal work of breathing Heart- Regular rate and rhythm, no murmurs, rubs or gallops, PMI not laterally displaced GI- soft, NT, ND, + BS Extremities- no clubbing, cyanosis, +1 L ankle edema  Wt Readings from Last 3 Encounters:  07/15/18 166 lb 12.8 oz (75.7 kg)  06/18/17 155 lb (70.3 kg)  06/12/17 155 lb 8 oz (70.5 kg)    EKG tracing ordered today is personally reviewed and shows sinus rhythm 57 bpm, RP 184 msec,QTc 389 msec, incomplete RBB, nonspecific St/T changes  Assessment and Plan:  1. Cryptogenic stroke No afib  2. SVT He had 3  episodes of SVT on 05/19/18, longest lasting 1 min 52 seconds at 220 bpm.  He was asymptomatic with these.  3. Swelling, SOB S/p prior doppler which did not reveal DVT per patient Will check cbc, bmet, tsh, bnp today Echo to evaluate EF  Refer to general cardiology in [place] Return to see me as needed  Thompson Grayer MD, [facility] 07/15/2018 10:14 AM

## 2018-07-15 NOTE — Patient Instructions (Addendum)
Medication Instructions:  Your physician recommends that you continue on your current medications as directed. Please refer to the Current Medication list given to you today.  Reduce your salt intake.  See the DASH diet below  Labwork: You will get lab work today:  CBC, BMET, BNP and TSH  Testing/Procedures: Your physician has requested that you have an echocardiogram. Echocardiography is a painless test that uses sound waves to create images of your heart. It provides your doctor with information about the size and shape of your heart and how well your heart's chambers and valves are working. This procedure takes approximately one hour. There are no restrictions for this procedure.  Please schedule for an ECHO in [place].  Follow-Up: Your physician wants you to follow-up in: as needed with Dr. Rayann Heman.     Any Other Special Instructions Will Be Listed Below (If Applicable).  We are referring you to a cardiologist in [place].    If you need a refill on your cardiac medications before your next appointment, please call your pharmacy.    DASH Eating Plan DASH stands for "Dietary Approaches to Stop Hypertension." The DASH eating plan is a healthy eating plan that has been shown to reduce high blood pressure (hypertension). It may also reduce your risk for type 2 diabetes, heart disease, and stroke. The DASH eating plan may also help with weight loss. What are tips for following this plan? General guidelines  Avoid eating more than 2,300 mg (milligrams) of salt (sodium) a day. If you have hypertension, you may need to reduce your sodium intake to 1,500 mg a day.  Limit alcohol intake to no more than 1 drink a day for nonpregnant women and 2 drinks a day for men. One drink equals 12 oz of beer, 5 oz of wine, or 1 oz of hard liquor.  Work with your health care provider to maintain a healthy body weight or to lose weight. Ask what an ideal weight is for you.  Get at least 30 minutes of exercise  that causes your heart to beat faster (aerobic exercise) most days of the week. Activities may include walking, swimming, or biking.  Work with your health care provider or diet and nutrition specialist (dietitian) to adjust your eating plan to your individual calorie needs. Reading food labels  Check food labels for the amount of sodium per serving. Choose foods with less than 5 percent of the Daily Value of sodium. Generally, foods with less than 300 mg of sodium per serving fit into this eating plan.  To find whole grains, look for the word "whole" as the first word in the ingredient list. Shopping  Buy products labeled as "low-sodium" or "no salt added."  Buy fresh foods. Avoid canned foods and premade or frozen meals. Cooking  Avoid adding salt when cooking. Use salt-free seasonings or herbs instead of table salt or sea salt. Check with your health care provider or pharmacist before using salt substitutes.  Do not fry foods. Cook foods using healthy methods such as baking, boiling, grilling, and broiling instead.  Cook with heart-healthy oils, such as olive, canola, soybean, or sunflower oil. Meal planning   Eat a balanced diet that includes: ? 5 or more servings of fruits and vegetables each day. At each meal, try to fill half of your plate with fruits and vegetables. ? Up to 6-8 servings of whole grains each day. ? Less than 6 oz of lean meat, poultry, or fish each day. A 3-oz serving  of meat is about the same size as a deck of cards. One egg equals 1 oz. ? 2 servings of low-fat dairy each day. ? A serving of nuts, seeds, or beans 5 times each week. ? Heart-healthy fats. Healthy fats called Omega-3 fatty acids are found in foods such as flaxseeds and coldwater fish, like sardines, salmon, and mackerel.  Limit how much you eat of the following: ? Canned or prepackaged foods. ? Food that is high in trans fat, such as fried foods. ? Food that is high in saturated fat, such as  fatty meat. ? Sweets, desserts, sugary drinks, and other foods with added sugar. ? Full-fat dairy products.  Do not salt foods before eating.  Try to eat at least 2 vegetarian meals each week.  Eat more home-cooked food and less restaurant, buffet, and fast food.  When eating at a restaurant, ask that your food be prepared with less salt or no salt, if possible. What foods are recommended? The items listed may not be a complete list. Talk with your dietitian about what dietary choices are best for you. Grains Whole-grain or whole-wheat bread. Whole-grain or whole-wheat pasta. Brown rice. [place]row. Bulgur. Whole-grain and low-sodium cereals. Pita bread. Low-fat, low-sodium crackers. Whole-wheat flour tortillas. Vegetables Fresh or frozen vegetables (raw, steamed, roasted, or grilled). Low-sodium or reduced-sodium tomato and vegetable juice. Low-sodium or reduced-sodium tomato sauce and tomato paste. Low-sodium or reduced-sodium canned vegetables. Fruits All fresh, dried, or frozen fruit. Canned fruit in natural juice (without added sugar). Meat and other protein foods Skinless chicken or [place]. Ground chicken or [place]. Pork with fat trimmed off. Fish and seafood. Egg whites. Dried beans, peas, or lentils. Unsalted nuts, nut butters, and seeds. Unsalted canned beans. Lean cuts of beef with fat trimmed off. Low-sodium, lean deli meat. Dairy Low-fat (1%) or fat-free (skim) milk. Fat-free, low-fat, or reduced-fat cheeses. Nonfat, low-sodium ricotta or cottage cheese. Low-fat or nonfat yogurt. Low-fat, low-sodium cheese. Fats and oils Soft margarine without trans fats. Vegetable oil. Low-fat, reduced-fat, or light mayonnaise and salad dressings (reduced-sodium). Canola, safflower, olive, soybean, and sunflower oils. Avocado. Seasoning and other foods Herbs. Spices. Seasoning mixes without salt. Unsalted popcorn and pretzels. Fat-free sweets. What foods are not recommended? The items  listed may not be a complete list. Talk with your dietitian about what dietary choices are best for you. Grains Baked goods made with fat, such as croissants, muffins, or some breads. Dry pasta or rice meal packs. Vegetables Creamed or fried vegetables. Vegetables in a cheese sauce. Regular canned vegetables (not low-sodium or reduced-sodium). Regular canned tomato sauce and paste (not low-sodium or reduced-sodium). Regular tomato and vegetable juice (not low-sodium or reduced-sodium). Angie Fava. Olives. Fruits Canned fruit in a light or heavy syrup. Fried fruit. Fruit in cream or butter sauce. Meat and other protein foods Fatty cuts of meat. Ribs. Fried meat. Berniece Salines. Sausage. Bologna and other processed lunch meats. Salami. Fatback. Hotdogs. Bratwurst. Salted nuts and seeds. Canned beans with added salt. Canned or smoked fish. Whole eggs or egg yolks. Chicken or [place] with skin. Dairy Whole or 2% milk, cream, and half-and-half. Whole or full-fat cream cheese. Whole-fat or sweetened yogurt. Full-fat cheese. Nondairy creamers. Whipped toppings. Processed cheese and cheese spreads. Fats and oils Butter. Stick margarine. Lard. Shortening. Ghee. Bacon fat. Tropical oils, such as coconut, palm kernel, or palm oil. Seasoning and other foods Salted popcorn and pretzels. Onion salt, garlic salt, seasoned salt, table salt, and sea salt. Worcestershire sauce. Tartar sauce. Barbecue sauce.  Teriyaki sauce. Soy sauce, including reduced-sodium. Steak sauce. Canned and packaged gravies. Fish sauce. Oyster sauce. Cocktail sauce. Horseradish that you find on the shelf. Ketchup. Mustard. Meat flavorings and tenderizers. Bouillon cubes. Hot sauce and Tabasco sauce. Premade or packaged marinades. Premade or packaged taco seasonings. Relishes. Regular salad dressings. Where to find more information:  National Heart, Lung, and [place]: https://wilson-eaton.com/  American Heart Association: www.heart.org Summary  The  DASH eating plan is a healthy eating plan that has been shown to reduce high blood pressure (hypertension). It may also reduce your risk for type 2 diabetes, heart disease, and stroke.  With the DASH eating plan, you should limit salt (sodium) intake to 2,300 mg a day. If you have hypertension, you may need to reduce your sodium intake to 1,500 mg a day.  When on the DASH eating plan, aim to eat more fresh fruits and vegetables, whole grains, lean proteins, low-fat dairy, and heart-healthy fats.  Work with your health care provider or diet and nutrition specialist (dietitian) to adjust your eating plan to your individual calorie needs. This information is not intended to replace advice given to you by your health care provider. Make sure you discuss any questions you have with your health care provider. Document Released: 11/02/2011 Document Revised: 11/06/2016 Document Reviewed: 11/06/2016 Elsevier Interactive Patient Education  [employer].

## 2018-07-16 LAB — BASIC METABOLIC PANEL
BUN/Creatinine Ratio: 12 (ref 10–24)
BUN: 14 mg/dL (ref 8–27)
CO2: 21 mmol/L (ref 20–29)
Calcium: 9.2 mg/dL (ref 8.6–10.2)
Chloride: 106 mmol/L (ref 96–106)
Creatinine, Ser: 1.2 mg/dL (ref 0.76–1.27)
GFR calc Af Amer: 67 mL/min/{1.73_m2} (ref >59)
GFR calc non Af Amer: 58 mL/min/{1.73_m2} — ABNORMAL LOW (ref >59)
Glucose: 164 mg/dL — ABNORMAL HIGH (ref 65–99)
Potassium: 4.4 mmol/L (ref 3.5–5.2)
Sodium: 141 mmol/L (ref 134–144)

## 2018-07-16 LAB — CBC WITH DIFFERENTIAL/PLATELET
Basophils Absolute: 0 10*3/uL (ref 0.0–0.2)
Basos: 0 %
EOS (ABSOLUTE): 0.1 10*3/uL (ref 0.0–0.4)
Eos: 2 %
Hematocrit: 42.7 % (ref 37.5–51.0)
Hemoglobin: 13.9 g/dL (ref 13.0–17.7)
Immature Grans (Abs): 0 10*3/uL (ref 0.0–0.1)
Immature Granulocytes: 0 %
Lymphocytes Absolute: 2 10*3/uL (ref 0.7–3.1)
Lymphs: 36 %
MCH: 26 pg — ABNORMAL LOW (ref 26.6–33.0)
MCHC: 32.6 g/dL (ref 31.5–35.7)
MCV: 80 fL (ref 79–97)
Monocytes Absolute: 0.3 10*3/uL (ref 0.1–0.9)
Monocytes: 5 %
Neutrophils Absolute: 3.2 10*3/uL (ref 1.4–7.0)
Neutrophils: 57 %
Platelets: 228 10*3/uL (ref 150–450)
RBC: 5.34 x10E6/uL (ref 4.14–5.80)
RDW: 14.3 % (ref 12.3–15.4)
WBC: 5.6 10*3/uL (ref 3.4–10.8)

## 2018-07-16 LAB — CUP PACEART INCLINIC DEVICE CHECK
Date Time Interrogation Session: 20190820124723
Implantable Pulse Generator Implant Date: 20170922

## 2018-07-16 LAB — TSH: TSH: 4.01 u[IU]/mL (ref 0.450–4.500)

## 2018-07-16 LAB — BRAIN NATRIURETIC PEPTIDE: BNP: 32.2 pg/mL (ref 0.0–100.0)

## 2018-07-17 DIAGNOSIS — Z8673 Personal history of transient ischemic attack (TIA), and cerebral infarction without residual deficits: Secondary | ICD-10-CM | POA: Diagnosis not present

## 2018-07-17 DIAGNOSIS — H903 Sensorineural hearing loss, bilateral: Secondary | ICD-10-CM | POA: Diagnosis not present

## 2018-07-17 DIAGNOSIS — M6281 Muscle weakness (generalized): Secondary | ICD-10-CM | POA: Diagnosis not present

## 2018-07-17 DIAGNOSIS — R4701 Aphasia: Secondary | ICD-10-CM | POA: Diagnosis not present

## 2018-07-17 DIAGNOSIS — I10 Essential (primary) hypertension: Secondary | ICD-10-CM | POA: Diagnosis not present

## 2018-07-17 DIAGNOSIS — G20 Parkinson's disease: Secondary | ICD-10-CM | POA: Diagnosis not present

## 2018-07-17 DIAGNOSIS — H6123 Impacted cerumen, bilateral: Secondary | ICD-10-CM | POA: Diagnosis not present

## 2018-07-18 ENCOUNTER — Ambulatory Visit (INDEPENDENT_AMBULATORY_CARE_PROVIDER_SITE_OTHER): Payer: Medicare HMO

## 2018-07-18 ENCOUNTER — Other Ambulatory Visit: Payer: Self-pay

## 2018-07-18 DIAGNOSIS — R0602 Shortness of breath: Secondary | ICD-10-CM | POA: Diagnosis not present

## 2018-07-19 DIAGNOSIS — G20 Parkinson's disease: Secondary | ICD-10-CM | POA: Diagnosis not present

## 2018-07-19 DIAGNOSIS — I10 Essential (primary) hypertension: Secondary | ICD-10-CM | POA: Diagnosis not present

## 2018-07-19 DIAGNOSIS — M6281 Muscle weakness (generalized): Secondary | ICD-10-CM | POA: Diagnosis not present

## 2018-07-22 DIAGNOSIS — M6281 Muscle weakness (generalized): Secondary | ICD-10-CM | POA: Diagnosis not present

## 2018-07-22 DIAGNOSIS — I10 Essential (primary) hypertension: Secondary | ICD-10-CM | POA: Diagnosis not present

## 2018-07-22 DIAGNOSIS — G20 Parkinson's disease: Secondary | ICD-10-CM | POA: Diagnosis not present

## 2018-07-23 DIAGNOSIS — G20 Parkinson's disease: Secondary | ICD-10-CM | POA: Diagnosis not present

## 2018-07-23 DIAGNOSIS — I679 Cerebrovascular disease, unspecified: Secondary | ICD-10-CM | POA: Diagnosis not present

## 2018-07-23 DIAGNOSIS — G4700 Insomnia, unspecified: Secondary | ICD-10-CM | POA: Diagnosis not present

## 2018-07-23 DIAGNOSIS — M6281 Muscle weakness (generalized): Secondary | ICD-10-CM | POA: Diagnosis not present

## 2018-07-23 DIAGNOSIS — R262 Difficulty in walking, not elsewhere classified: Secondary | ICD-10-CM | POA: Diagnosis not present

## 2018-07-24 DIAGNOSIS — I10 Essential (primary) hypertension: Secondary | ICD-10-CM | POA: Diagnosis not present

## 2018-07-24 DIAGNOSIS — G20 Parkinson's disease: Secondary | ICD-10-CM | POA: Diagnosis not present

## 2018-07-24 DIAGNOSIS — M6281 Muscle weakness (generalized): Secondary | ICD-10-CM | POA: Diagnosis not present

## 2018-07-25 ENCOUNTER — Encounter (INDEPENDENT_AMBULATORY_CARE_PROVIDER_SITE_OTHER): Payer: Self-pay | Admitting: Neurology

## 2018-07-25 ENCOUNTER — Ambulatory Visit (INDEPENDENT_AMBULATORY_CARE_PROVIDER_SITE_OTHER): Payer: Medicare (Managed Care) | Admitting: Neurology

## 2018-07-25 VITALS — BP 168/98 | HR 57 | Ht 67.0 in | Wt 165.0 lb

## 2018-07-25 DIAGNOSIS — R270 Ataxia, unspecified: Secondary | ICD-10-CM

## 2018-07-25 DIAGNOSIS — G20 Parkinson's disease: Secondary | ICD-10-CM

## 2018-07-25 DIAGNOSIS — Z72820 Sleep deprivation: Secondary | ICD-10-CM

## 2018-07-25 DIAGNOSIS — R471 Dysarthria and anarthria: Secondary | ICD-10-CM

## 2018-07-25 DIAGNOSIS — I639 Cerebral infarction, unspecified: Secondary | ICD-10-CM

## 2018-07-25 DIAGNOSIS — F482 Pseudobulbar affect: Secondary | ICD-10-CM

## 2018-07-25 DIAGNOSIS — R69 Illness, unspecified: Secondary | ICD-10-CM | POA: Diagnosis not present

## 2018-07-25 NOTE — Progress Notes (Signed)
[email or web address]    Subjective:      77 y/o M with hx HTN, HLD, borderline DM, glaucoma, prior CVA, presents to Movement Disorders clinic for f/u of possible Parkinson's Disease.     Walking is still an issue, but finishing PT/OT/SLT which has helped.  BP is still fluctuating, can get high, but not low.  At night can get some nausea and nightsweats, which is likely BP spiking. Can feel some nausea after and before eating and feels he can be short of breath at times.     On Nuedexta now, which has resolved his crying episodes. Tolerating with no issue. Can get periods of feet swelling too. Handwriting is better. Does say he fell 4x in 3 weeks. Unclear why, unclear precipitating factors.     -------------------------------  History retained from initial consultation:    History is mainly from family. He was diagnosed with Parkinson's Disease in 2015, with initial symptoms being changes to his gait.  He was walking slower and his movements were slowed overall.  They report also noting tremors in the legs and hands, unclear laterality. He noticed the tremors throughout the day, but the daughter notices it more with activity, when feeding himself.     The diagnosis of Parkinson's was considered, so he was started on Sinemet, however did not take it because it gave him GI issues. During that year, he had a number of TIAs, but then in 2016 had a completed CVA.  Described a 'clot in the back of his head' by family.  Afterwards, he was having issues with speech and swallowing. Swallow study was normal.     At some point then, the dx of Parkinson's was questioned, but "tests" were completed that confirmed, though unclear what that was.  Was put back on Sinemet.  Once he is on Sinemet, his walking and tightness improves, but they are concerned about progression as he is falling frequently now. No cognitive changes, just difficulty talking.    Weak spells happen in the morning, he tells family that he's  weak. Can get to lie down, but doesn't feel dizzy, woozy or lightheaded.  He feels that he starts to feel weak after the medicine.     Last complete PT in December last year. He has completed SLT which helped.    + uncontrollable laughing and crying noted, depression/anxiety and fatigue.     No family history of Parkinson's Disease or tremor. No history significant head injury or LOC.  No history of neuroleptic or chemical exposure.     Prior history reviewed:    Keygan Dumond was seen today in the movement disorders clinic for neurologic consultation at the request of [name], MD. The consultation is for the evaluation of PD. No records are available to me from his prior neurologist. Therefore, all history was obtained from the patient and wife. He was previously being seen in [place], [place]. He was dx with with PD about 3 years ago and wife states that his first sx was was slow handwriting and slow to eat and slight jaw tremor. He was started on carbidopa/levodopa 10/100 tid and pt doesn't think that it helped. It was taking it at 8am/1pm/8-9pm. Lunchtime is at noon. He takes the last at bedtime. However, he admits that he has not taken it for months.    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 When she got to him, he  was just laying on the floor. They called 911. He was not on ASA at the time. He was put on plavix and lipitor. Speech has been biggest issue since the stroke. States that he never went through speech therapy, even when he was in rehab. He did physical therapy only. R leg remains a bit weak after stroke.    07/18/16 update: The patient is seen today in follow-up. I have reviewed many records from several hospitalizations and also his prior neurologist records since our last visit. He was first seen by his prior neurologist in [place] on 06/15/2014 with complaints of speech changes and dragging his right leg and trouble buttoning his clothing. On  examination, she reports no bradykinesia at that visit, but she reports that he had lip tremor. He was dragging his right leg and his rapid alternating movements were normal. She did an MRI of the brain just following that visit and this was done on 07/06/2014. There was a small acute right basal ganglia infarct. He followed up with her just after that in August, 2015 and was started on levodopa, 10/100 3 times per day, as she felt that his infarct was acute, whereas his symptoms were for 6 months and he had right leg symptoms and right sided infarct. 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 In September, 2015 the notes said that the patient noted significant improvement on aspirin and a statin. He was then hospitalized in May, 2016 and she followed up with him after that and said that there was just mild slurring of speech. On 07/19/2015 she did have him wearing an event monitor for one month that was negative. It does appear that he had been off of levodopa ever since his infarct in May, 2016. I was able to get his hospital records from [facility] from his infarction. He was admitted on 04/08/2015, but they reported left-sided weakness and dizziness. He had an echocardiogram on 04/09/2015 with an ejection fraction of 60-65%. An EEG was normal. An MRA of the neck was normal. An MRI of the brain stated that "DWI appears to demonstrate multiple punctate areas of high signal involving the cerebellar hemispheres and cerebellar vermis." I did have the opportunity to review these images once they became available, and DWI was in fact positive in both cerebellar hemispheres and amongst the vermis. His carotid ultrasound was negative. There were minimal notes regarding his speech but nothing stated that it was particularly slurred. He was then recently hospitalized once again more on 07/10/2016 and I again reviewed those records. He was hospitalized for inability to swallow. Pt states that swallowing  issues are with liquids and not solids because he was trying to take too much in the mouth at once. An MRI without gadolinium was done. I did not get those images. There was reported to be "a few old cerebellar infarctions." There is reported to be an old infarction in the left frontal region. DWI was negative. It was concluded that his swallowing issues were from Parkinson's disease and his refusal to take levodopa. He did have a barium swallow that was reported to show mild oropharyngeal dysphagia, but no dietary changes were made, with the exceptions of to take small bites and administer meds one at a time. He ended up having a very detailed EMG at our office following these hospitalizations to rule out motor neuron disease and there was no evidence of this.    09/08/16 update: Pt returns accompanied by his wife who supplements  the history. Since our last visit the patient had a DaT scan in 07/2016 and it was abnormal demonstrating decreased uptake, being virtually absent in the bilateral caudate and the putamen. Wife does state that when he was last hospitalized, he had been given levodopa and it helped but was d/c in the hospital. He also saw Dr. Johney Frame and had a TEE on 08/18/16 and that was normal with the exception of evidence of a PFO. He had a LE doppler on 08/18/16 and that was normal. He had a loop recorder implanted on 08/18/16 and as of 08/28/16 there were no episodes of atrial fibrillation.       The following portions of the patient's history were reviewed and updated as appropriate: allergies, current medications, past medical history, past social history, past surgical history and problem list.    Review of Systems  + vocal changes, gait disorder.  No recent illness. Denies fever, chills, cough, sinus pain, eye pain, eye redness, ear pain, rhinorrhea, sore throat, chest pain, SOB, wheezing, abd pain, Nausea, Vomiting, diarrhea, constipation, dysuria, or rashes.  All other systems reviewed and are  negative except as previously noted in the HPI.    Objective:     Vitals:    07/25/18 0940   BP: (!) 168/98   Pulse: (!) 57     Constitutional: NAD   Eyes: Clear conjunctiva  Cardiovascular: RRR  Respiratory: CTA B   Musculoskeletal: stooped posture, normal muscle bulk.  Integumentary: No abnormal rash noted  Psych: Flat affect, decreased blink rate. No more emotionality noted.     Neurological:   Mental Status: Alert & oriented to person, place, month, & year.   Language: Dysarthric. Difficult to understand.      Cranial Nerves:  II, III: PERRL  III, IV, VI: Mild limitation to vertical gaze, No nystagmus, no palsies, no ptosis  V: Intact to LT V1-V3 distribution bilaterally.   VI: Symmetrical face and expression.   VIII: Hearing intact to finger rub bilaterally.   IX, X: Palate/Uvula elevates symmetrically.   XI: 5/5 Trapezius & SCM bilaterally.   XII: Tongue is midline.     Motor Exam: Strength 5/5 throughout and symmetric.    Sensation: Sensation to LT intact grossly through symmetrically. Romberg negative.    Cerebellar:   Finger to Nose: intact bilaterally with mild terminal dysmetria on FTN.    Reflexes: 2+ throughout, symmetric, with down-going toes. Negative Hoffman bilaterally.     Station/ Gait: Wider based, short stepped.  Shuffle.  Absent arm swing bilaterally, arms out.  Stooped. Freezing evident.     Tone increased throughout.  Fine motor use of both hands is slowed to RAM, finger tapping and open/close, R>L but more fluid with no freezing.  Foot tapping also reduced in this laterality.     The following was discussed with patient:   Exercise -- even 20 minutes 3x per week makes a difference   Alcohol   Tobacco. Smoking is a major risk factor for cardiovascular disease   Diet   Consider PSG   Treat AF   Meds   Antiplatelet or anticoagulant for ischemic stroke. Risk, benefit, toxicity was discussed.    Statins reduce the risk of risk. Risk, benefit, toxicity discussed.   Controlling your blood  pressure is important   Signs and symptoms of stroke   Call 911 for stroke like symptoms    Since this patient has had a well documented stroke or cardiovascular risk factors it is paramount that  continuing secondary stroke prevention measures be applied and reinforced regularly.     EPWORTH SLEEPINESS SCALE  How Likely Are You to Fall Asleep    Sitting and reading?      1- Slight chance of dozing or sleeping  Watching TV?       1- Slight chance of dozing or sleeping  Sitting inactive in a Public Space?    1- Slight chance of dozing or sleeping  Being a passenger in a car for an hour or more?  1- Slight chance of dozing or sleeping  Sitting quietly after lunch (no alcohol)?   1- Slight chance of dozing or sleeping  Stopped for a few minutes in traffic while driving?  1- Slight chance of dozing or sleeping  Total Score       6   Score 0-9 = Not sleepy   Score 10-17  = Moderate sleepy   Score 18+  = Very sleepy    Assessment:     77 y/o M with hx HTN, HLD, borderline DM, glaucoma, prior CVA, presents to Movement Disorders clinic for f/u of Parkinson's Disease. 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 It is unclear what symptoms are CVA related and which are PD related.      He is improved since last visit now on a consistent dose of Rytary as well as Nuedexta.  His speech is still impaired as this is gait and he remains a fall risk though, so it is important that he finishes PT/OT and speech.  He needs to continue these exercises as well.    We also discussed the need to follow with cardiology as he is doing.  He does have unpredictable episodes of quick, shortness of breath.  Unlikely that this is related to his Parkinson's disease, but if cleared by cardiology and pulmonology, can consider an as needed therapy.    Also discussed the impact of his prior stroke and  secondary stroke prevention, with screening completed as above.       Plan:     1) Parkinson's Disease -   - Continue Rytary to 3 capsules 145mg  4x daily, 5 hours apart.    2) Gait disorder - multifactorial; PD, comorbid medical issues, prior CVA.  A severe, chronic progressing condition. Fall risk   - Finish PT/OT/SLT and continue exercises.   - Referred to home health for services.   - Discussed fall risk at length.     3) Dysarthria -   - Continue SLT exercises.     4) Prior CVA - continue Plavix and statin for secondary stroke prevention.  - screening as above.     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 Patient can follow up sooner if needed. In the meantime, patient will contact the office with any questions or concerns.     -----------------------------------------------------------    Elenore Paddy, MD  Director, Movement Disorders Specialist  [facility]  [facility]    [address]  [address], [address]  [place] [address]    [place], [place] [address]  T [phone]  F [phone]   T [phone]  F [phone]     [email or web address]

## 2018-07-25 NOTE — Patient Instructions (Signed)
[email or web address]    Our plan:     1) Find the zippered compression socks to help with the swelling.        Today's Visit:      In today's visit I reviewed your medications and records relating your health - prior testing, blood work, reports of other health care providers present in your electronic medical record.     If you have records from non-[facility] doctors please send to [place] or bring to next office visit.     A copy of today's visit will be sent to your referring doctor and/or primary care doctor.    Let me know if there are things we could have done better for your office visit.    Patient satisfaction survey:      If you receive a patient satisfaction survey, I would greatly appreciate it if you would complete it.     We are like a car dealer where we aim for a score of 9 or 10.  If you had an experience that was less than a 9 or 10, please let me know so we can improve. If you had a good experience today, please let us know too.  We value your feedback.     Contact me online:      Patient Portal online - Please sign up for MyChart -- this is the best way to communicate me.  There is a messaging feature which can send messages directly to my inbox.  It is the best way to communicate with me and get test results, medication refills or ask questions.     You can expect to get a response within 24-48 hours during weekdays - if you do not, resend your request and inform us we did not respond. My goal is for every question answered as soon as possible. Average response for a phone call is 3 days due to the volume we receive (which is why MyChart is preferred).    MyChart should not be used if you are having a medical emergency -- call 911 in that case.     Coupons for medication:      If you would like to see if samples or vouchers available for your medicines please consider checking online.  Most medicines have web sites where you can print coupons or vouchers for you co-pay.     For  example: AutomobileBuzz.is,  DSLRemote.se, Namendaxr.com, http://www.walker-hill.info/, Rytary.[email or web address], Vimpat.com    Thank you for trusting me with your health.      Take care,      Mattia Osterman "Johnston Ebbs, MD  Director, Movement Disorders Specialist  [facility]  [facility]    [email or web address]      [address]  [address]  [place] [address]    [place], [place] [address]  T [phone]  F [phone]   T [phone]  F [phone]

## 2018-07-29 DIAGNOSIS — Z79899 Other long term (current) drug therapy: Secondary | ICD-10-CM | POA: Diagnosis not present

## 2018-07-29 DIAGNOSIS — G20 Parkinson's disease: Secondary | ICD-10-CM | POA: Diagnosis not present

## 2018-07-29 DIAGNOSIS — R69 Illness, unspecified: Secondary | ICD-10-CM | POA: Diagnosis not present

## 2018-07-29 DIAGNOSIS — I509 Heart failure, unspecified: Secondary | ICD-10-CM | POA: Diagnosis not present

## 2018-07-29 DIAGNOSIS — Z7902 Long term (current) use of antithrombotics/antiplatelets: Secondary | ICD-10-CM | POA: Diagnosis not present

## 2018-07-29 DIAGNOSIS — I10 Essential (primary) hypertension: Secondary | ICD-10-CM | POA: Diagnosis not present

## 2018-07-29 DIAGNOSIS — Z8551 Personal history of malignant neoplasm of bladder: Secondary | ICD-10-CM | POA: Diagnosis not present

## 2018-07-29 DIAGNOSIS — Z8673 Personal history of transient ischemic attack (TIA), and cerebral infarction without residual deficits: Secondary | ICD-10-CM | POA: Diagnosis not present

## 2018-07-29 DIAGNOSIS — Z885 Allergy status to narcotic agent status: Secondary | ICD-10-CM | POA: Diagnosis not present

## 2018-07-29 DIAGNOSIS — E785 Hyperlipidemia, unspecified: Secondary | ICD-10-CM | POA: Diagnosis not present

## 2018-07-29 DIAGNOSIS — M6281 Muscle weakness (generalized): Secondary | ICD-10-CM | POA: Diagnosis not present

## 2018-07-29 DIAGNOSIS — R32 Unspecified urinary incontinence: Secondary | ICD-10-CM | POA: Diagnosis not present

## 2018-07-29 DIAGNOSIS — R109 Unspecified abdominal pain: Secondary | ICD-10-CM | POA: Diagnosis not present

## 2018-07-29 DIAGNOSIS — N390 Urinary tract infection, site not specified: Secondary | ICD-10-CM | POA: Diagnosis not present

## 2018-07-29 DIAGNOSIS — R110 Nausea: Secondary | ICD-10-CM | POA: Diagnosis not present

## 2018-07-29 DIAGNOSIS — R1084 Generalized abdominal pain: Secondary | ICD-10-CM | POA: Diagnosis not present

## 2018-07-29 DIAGNOSIS — I69393 Ataxia following cerebral infarction: Secondary | ICD-10-CM | POA: Diagnosis not present

## 2018-07-29 DIAGNOSIS — R1013 Epigastric pain: Secondary | ICD-10-CM | POA: Diagnosis not present

## 2018-07-30 DIAGNOSIS — R109 Unspecified abdominal pain: Secondary | ICD-10-CM | POA: Diagnosis not present

## 2018-07-31 IMAGING — DX DG CHEST 2V
2 series · 2 of 2 positions shown · non-contrast
Comparison: 05/22/2016

CLINICAL DATA: Chest pain, status post loop recorder placement

EXAM:
CHEST  2 VIEW

[chest pa]
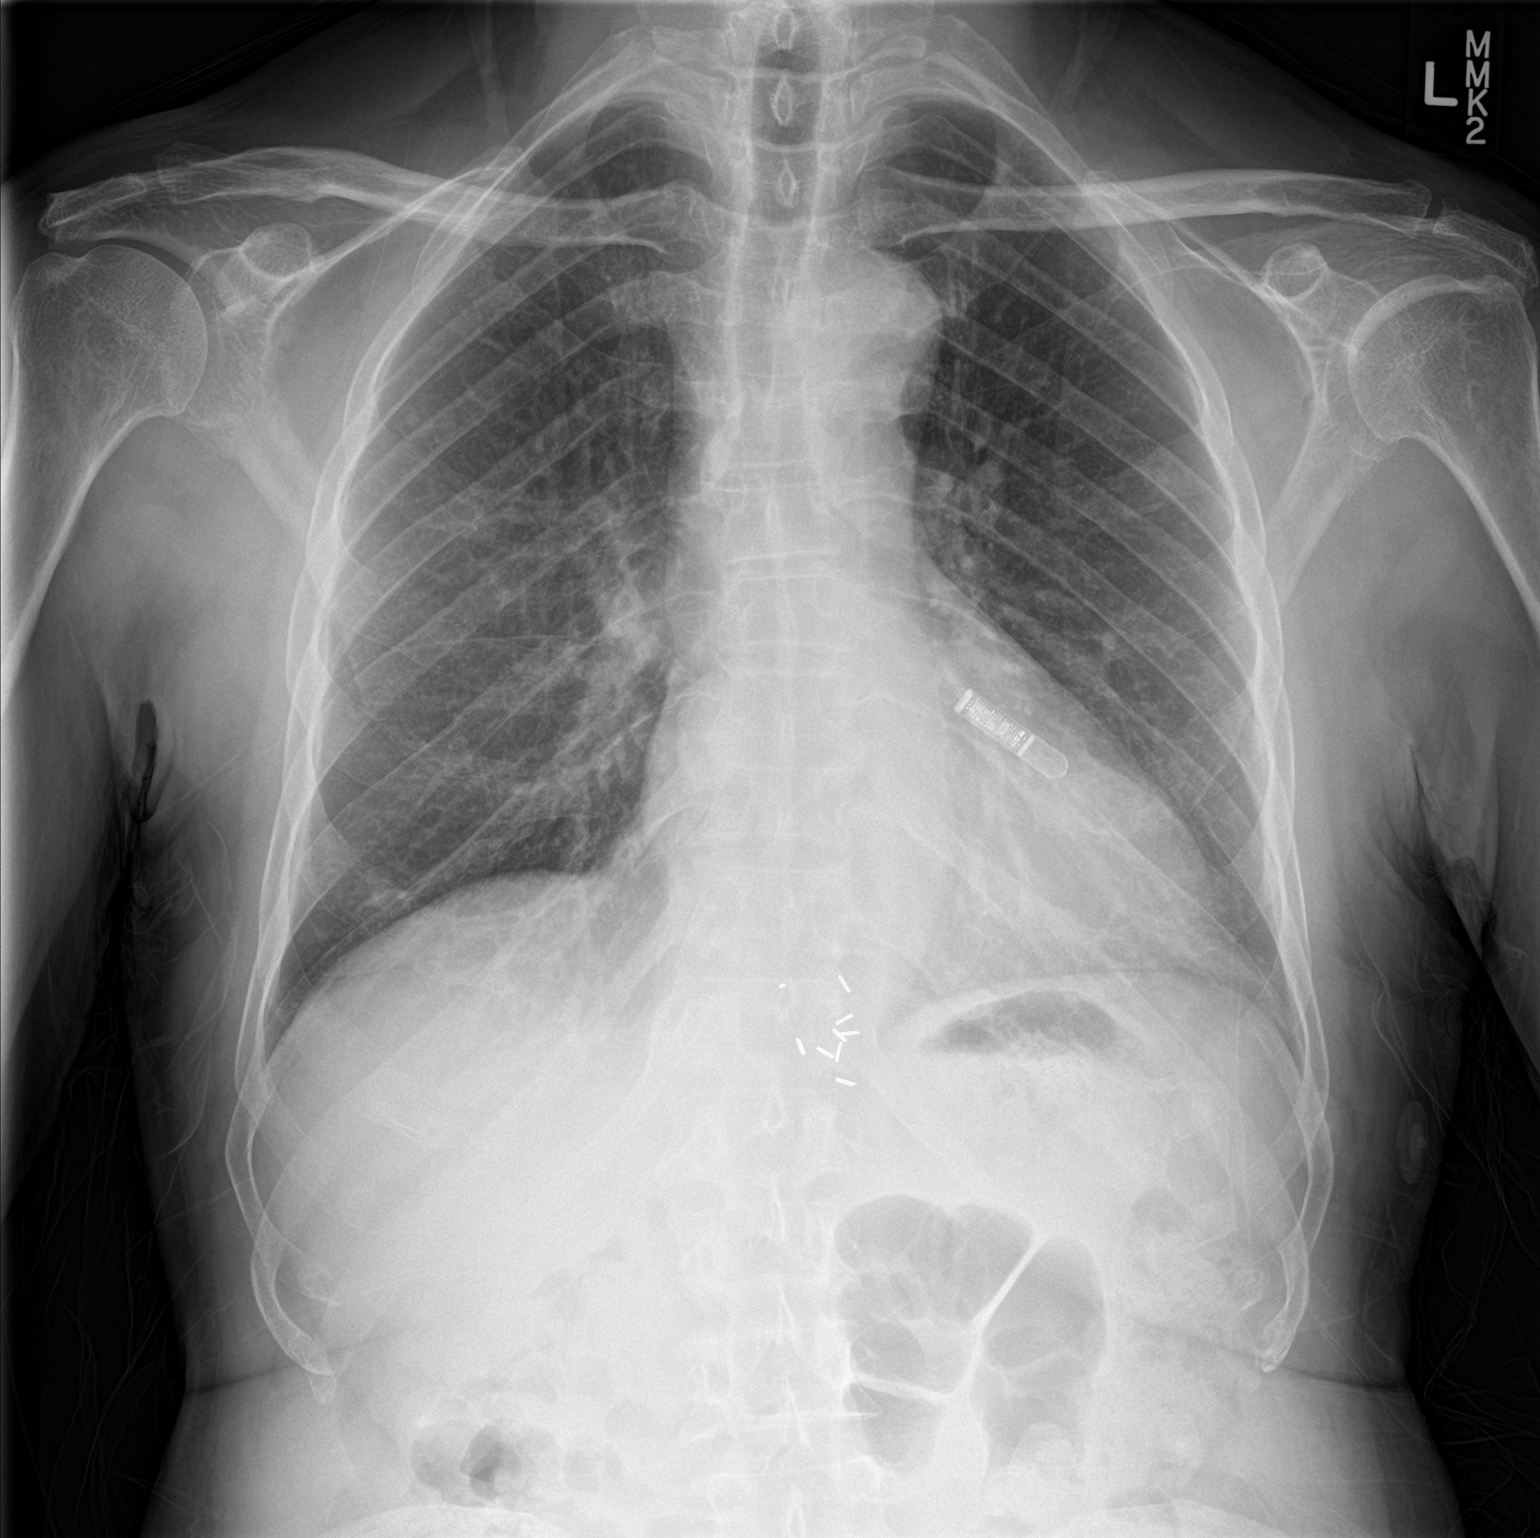

[chest lat]
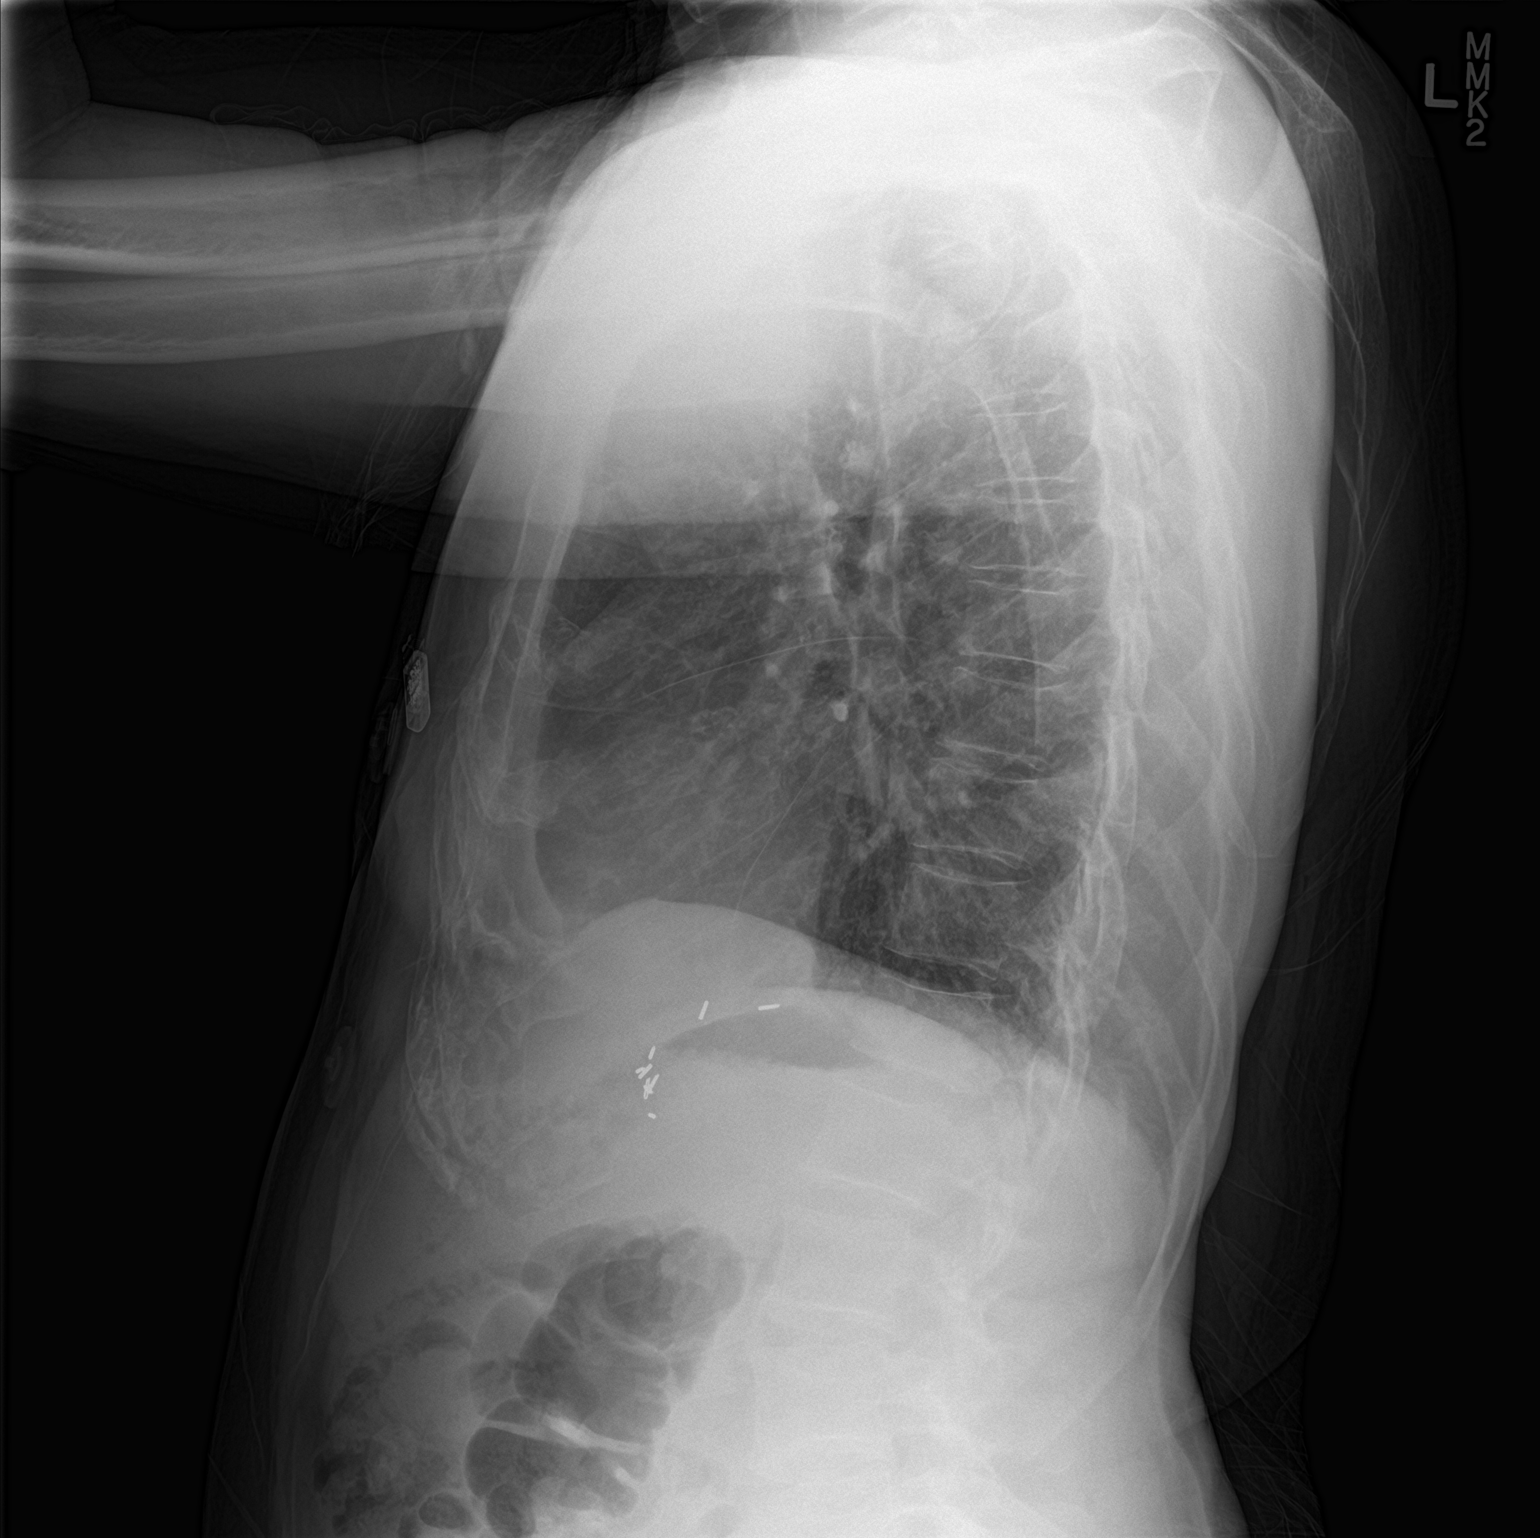

[2 of 2 positions shown; findings below may reference images not displayed]

FINDINGS: Lungs are essentially clear. No focal consolidation. No pleural
effusion or pneumothorax.

Heart is normal in size. Loop recorder overlying the left
hemithorax.

Surgical clips at the GE junction.

Visualized osseous structures are within normal limits.
IMPRESSION: Loop recorder overlying the left hemithorax.

No evidence of acute cardiopulmonary disease.

## 2018-08-07 ENCOUNTER — Ambulatory Visit (INDEPENDENT_AMBULATORY_CARE_PROVIDER_SITE_OTHER): Payer: Medicare HMO | Admitting: *Deleted

## 2018-08-07 ENCOUNTER — Telehealth: Payer: Self-pay

## 2018-08-07 DIAGNOSIS — M6281 Muscle weakness (generalized): Secondary | ICD-10-CM | POA: Diagnosis not present

## 2018-08-07 DIAGNOSIS — I639 Cerebral infarction, unspecified: Secondary | ICD-10-CM | POA: Diagnosis not present

## 2018-08-07 DIAGNOSIS — I10 Essential (primary) hypertension: Secondary | ICD-10-CM | POA: Diagnosis not present

## 2018-08-07 DIAGNOSIS — G20 Parkinson's disease: Secondary | ICD-10-CM | POA: Diagnosis not present

## 2018-08-07 NOTE — Progress Notes (Signed)
Carelink Summary Report / Loop Recorder 

## 2018-08-07 NOTE — Telephone Encounter (Signed)
Confirmed remote transmission w/ pt wife.  Pt monitor has not updated within 14 days

## 2018-08-14 ENCOUNTER — Encounter: Payer: Self-pay | Admitting: Cardiology

## 2018-08-19 ENCOUNTER — Encounter (INDEPENDENT_AMBULATORY_CARE_PROVIDER_SITE_OTHER): Payer: Self-pay

## 2018-08-20 ENCOUNTER — Encounter: Payer: Self-pay | Admitting: Cardiology

## 2018-08-20 ENCOUNTER — Ambulatory Visit (INDEPENDENT_AMBULATORY_CARE_PROVIDER_SITE_OTHER): Payer: Medicare HMO | Admitting: Cardiology

## 2018-08-20 ENCOUNTER — Encounter: Payer: Self-pay | Admitting: *Deleted

## 2018-08-20 VITALS — BP 144/85 | HR 73 | Ht 67.0 in | Wt 168.2 lb

## 2018-08-20 DIAGNOSIS — M7989 Other specified soft tissue disorders: Secondary | ICD-10-CM

## 2018-08-20 MED ORDER — FUROSEMIDE 20 MG PO TABS: 20.0000 mg | ORAL_TABLET | ORAL | 1 refills | Status: AC | PRN

## 2018-08-20 NOTE — Progress Notes (Signed)
Clinical Summary Mr. Starace is a 77 y.o.male seen today for follow up of the following medical problems.   1. LE edema - several year history of intermittent leg swelling, L>R. Recently much worst than normal.  - 06/2018 echo LVEF 77-41%, grade I diastolic dysfunction - 2878 LE venous US no DVT.  - he reports more recent LE venous US at [facility] that was negative, about 6 weeks ago.    2. 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 Anticoagulant long-term use    PLAVIX  . Benign localized prostatic hyperplasia with lower urinary tract symptoms (LUTS)   . Bladder tumor   . Borderline diabetes    watches diet  . ED (erectile dysfunction) of organic origin   . Gait disturbance, post-stroke    uses wheelchair long distance  . Glaucoma, right eye   . History of bleeding peptic ulcer    03/ 1979  s/p  surgery  . History of concussion    1987 no loc-- no residual  . History of CVA with residual deficit    05/ 2016 CYPTOGENIC CVA --- RESIDUAL GAIT DISTURBANCE AND SIALORRHEA ACCESS (DROOLING)  . Hyperlipidemia   . Hypertension   . Mild atherosclerosis of both carotid arteries    per duplex from outside facitlity 04-08-2015 less then 50% stenosis bilateral ICA  . Parkinson's disease [facility]) neurologist -- dr r. Mitzi Hansen falconer ([facility], [facility])   dx 2015  . PBA (pseudobulbar affect)    laughter-- residual from cva 05/ 2016  . PFO (patent foramen ovale)    per TEE 08-18-2016  . Speech impairment    post-stroke  . Status post placement of implantable loop recorder 08/18/2016   placed by dr allred  . Wears glasses      Allergies  Allergen Reactions  . Benazepril Other (See Comments)    Pt not sure of reaction. ?cough  . Doxycycline     Unknown  . Levofloxacin Hives and Swelling  . Simvastatin Other (See Comments)    Pt does not remember what the reaction was. ?muscle aches  .  Sulfamethoxazole-Trimethoprim Hives and Swelling  . Tamsulosin Hives and Swelling  . Tramadol   . Tramadol Hcl Hives and Swelling  . Sulfa Antibiotics Hives, Swelling and Rash    Severe rash     Current Outpatient Medications  Medication Sig Dispense Refill  . atorvastatin (LIPITOR) 40 MG tablet Take 40 mg by mouth every evening.    . Carbidopa-Levodopa ER (RYTARY) 36.25-145 MG CPCR Take 2 capsules by mouth 3 (three) times daily.     . clopidogrel (PLAVIX) 75 MG tablet Take 75 mg by mouth daily.     Marland Kitchen HYDROcodone-acetaminophen (NORCO/VICODIN) 5-325 MG tablet Take 1-2 tablets by mouth every 4 (four) hours as needed for moderate pain. 30 tablet 0  . hyoscyamine (LEVSIN SL) 0.125 MG SL tablet Take 1 tablet (0.125 mg total) by mouth every 4 (four) hours as needed for cramping. 30 tablet 0  . meclizine (ANTIVERT) 25 MG tablet Take 25 mg by mouth every 6 (six) hours as needed.  2  . metoprolol succinate (TOPROL-XL) 100 MG 24 hr tablet Take 100 mg by mouth every evening.     . mirabegron ER (MYRBETRIQ) 25 MG TB24 tablet Take 1 tablet (25 mg total) by mouth daily. 30 tablet 0  . mirabegron ER (MYRBETRIQ) 50 MG TB24 tablet Take  50 mg by mouth daily.    Marland Kitchen NUEDEXTA 20-10 MG CAPS Take 1 capsule by mouth 2 (two) times daily.  3  . phenazopyridine (PYRIDIUM) 100 MG tablet Take 1 tablet (100 mg total) by mouth 3 (three) times daily as needed for pain. 20 tablet 0  . Travoprost, BAK Free, (TRAVATAN) 0.004 % SOLN ophthalmic solution Place 1 drop into both eyes at bedtime.      No current facility-administered medications for this visit.      Past Surgical History:  Procedure Laterality Date  . APPENDECTOMY  1978  . CATARACT EXTRACTION W/ INTRAOCULAR LENS IMPLANT Right 2014 approx.  . CYSTOSCOPY W/ URETERAL STENT PLACEMENT Bilateral 05/07/2017   Procedure: CYSTOSCOPY WITH RETROGRADE PYELOGRAM/URETERAL;  Surgeon: Cleon Gustin, MD;  Location: [facility];  Service: Urology;   Laterality: Bilateral;  . EP IMPLANTABLE DEVICE N/A 08/18/2016   Procedure: Loop Recorder Insertion;  Surgeon: Thompson Grayer, MD;  Location: [place] [facility];  Service: Cardiovascular;  Laterality: N/A;  . REMOVAL NODULES BILATERAL FLANK  2008 approx.   per pt benign fatty tissue  . REPAIR OF PERFORATED ULCER  01/1978   bleeding peptic ulcer  . TEE WITHOUT CARDIOVERSION N/A 08/18/2016   Procedure: TRANSESOPHAGEAL ECHOCARDIOGRAM (TEE);  Surgeon: Sueanne Margarita, MD;  Location[facility] ENDOSCOPY;  Service: Cardiovascular;  Laterality: N/A; no evidence thrombus;  mobile atrial septum w/ positive bubble study consistent with PFO/ trivial AR, mild TR  . TRANSTHORACIC ECHOCARDIOGRAM  04/09/2015   ef 27-06%, grade 1 diastolic dysfunction/  mild AV sclerosis without stenosis/  mild TR,  trivial MR and PR  . TRANSURETHRAL RESECTION OF BLADDER TUMOR N/A 05/07/2017   Procedure: TRANSURETHRAL RESECTION OF BLADDER TUMOR (TURBT);  Surgeon: Cleon Gustin, MD;  Location: [facility];  Service: Urology;  Laterality: N/A;  . TRANSURETHRAL RESECTION OF BLADDER TUMOR N/A 06/18/2017   Procedure: TRANSURETHRAL RESECTION OF BLADDER TUMOR (TURBT);  Surgeon: Cleon Gustin, MD;  Location: [facility];  Service: Urology;  Laterality: N/A;  . TRANSURETHRAL RESECTION OF PROSTATE  08-20-2013   at [facility]     Allergies  Allergen Reactions  . Benazepril Other (See Comments)    Pt not sure of reaction. ?cough  . Doxycycline     Unknown  . Levofloxacin Hives and Swelling  . Simvastatin Other (See Comments)    Pt does not remember what the reaction was. ?muscle aches  . Sulfamethoxazole-Trimethoprim Hives and Swelling  . Tamsulosin Hives and Swelling  . Tramadol   . Tramadol Hcl Hives and Swelling  . Sulfa Antibiotics Hives, Swelling and Rash    Severe rash      Family History  Problem Relation Age of Onset  . Stroke Sister      Social History Mr. Sampedro reports that he has never smoked. He has  never used smokeless tobacco. Mr. Bifulco reports that he does not drink alcohol.   Review of Systems CONSTITUTIONAL: No weight loss, fever, chills, weakness or fatigue.  HEENT: Eyes: No visual loss, blurred vision, double vision or yellow sclerae.No hearing loss, sneezing, congestion, runny nose or sore throat.  SKIN: No rash or itching.  CARDIOVASCULAR: per hpi RESPIRATORY: No shortness of breath, cough or sputum.  GASTROINTESTINAL: No anorexia, nausea, vomiting or diarrhea. No abdominal pain or blood.  GENITOURINARY: No burning on urination, no polyuria NEUROLOGICAL: No headache, dizziness, syncope, paralysis, ataxia, numbness or tingling in the extremities. No change in bowel or bladder control.  MUSCULOSKELETAL: No muscle, back  pain, joint pain or stiffness.  LYMPHATICS: No enlarged nodes. No history of splenectomy.  PSYCHIATRIC: No history of depression or anxiety.  ENDOCRINOLOGIC: No reports of sweating, cold or heat intolerance. No polyuria or polydipsia.  Marland Kitchen   Physical Examination Vitals:   08/20/18 1410  BP: (!) 144/85  Pulse: 73   Vitals:   08/20/18 1410  Weight: 168 lb 3.2 oz (76.3 kg)  Height: 5\' 7"  (1.702 m)    Gen: resting comfortably, no acute distress HEENT: no scleral icterus, pupils equal round and reactive, no palptable cervical adenopathy,  CV: RRR, no m/r/g, no jvd Resp: Clear to auscultation bilaterally GI: abdomen is soft, non-tender, non-distended, normal bowel sounds, no hepatosplenomegaly MSK: extremities are warm, no edema.  Skin: warm, no rash Neuro:  no focal deficits Psych: appropriate affect    Assessment and Plan  1. Leg edema - severe asymmetic left leg swelling - he reports recent venous US that was negative, we will request records - findings not consistent with heart failure. Normal LVEF by echo, only mild diastolic dysfunction, normal BNP, swelling asymmetric with no evidence of fluid anywhere else on exam - referral to vascular  surgery, perhaps more proximal occlusive venous diseease or venous insufficiency.  - trial of prn lasix.   F/u with general cardiology as needed, continue regular f/u with EP regarding his loop recorder.     Arnoldo Lenis, M.D.

## 2018-08-20 NOTE — Patient Instructions (Signed)
Your physician recommends that you schedule a follow-up appointment in: Mike Nixon has recommended you make the following change in your medication:   START LASIX 20 MG AS NEEDED FOR SWELLING  You have been referred to VASCULAR SURGERY   Thank you for choosing [facility]!!

## 2018-08-21 ENCOUNTER — Ambulatory Visit (INDEPENDENT_AMBULATORY_CARE_PROVIDER_SITE_OTHER): Payer: Medicare HMO | Admitting: Urology

## 2018-08-21 DIAGNOSIS — C673 Malignant neoplasm of anterior wall of bladder: Secondary | ICD-10-CM

## 2018-08-23 LAB — CUP PACEART REMOTE DEVICE CHECK
Date Time Interrogation Session: 20190911153723
Implantable Pulse Generator Implant Date: 20170922

## 2018-08-26 DIAGNOSIS — N401 Enlarged prostate with lower urinary tract symptoms: Secondary | ICD-10-CM | POA: Diagnosis not present

## 2018-08-26 DIAGNOSIS — N183 Chronic kidney disease, stage 3 (moderate): Secondary | ICD-10-CM | POA: Diagnosis not present

## 2018-08-26 DIAGNOSIS — R600 Localized edema: Secondary | ICD-10-CM | POA: Diagnosis not present

## 2018-08-26 DIAGNOSIS — C679 Malignant neoplasm of bladder, unspecified: Secondary | ICD-10-CM | POA: Diagnosis not present

## 2018-08-26 DIAGNOSIS — Z6826 Body mass index (BMI) 26.0-26.9, adult: Secondary | ICD-10-CM | POA: Diagnosis not present

## 2018-08-26 DIAGNOSIS — I6930 Unspecified sequelae of cerebral infarction: Secondary | ICD-10-CM | POA: Diagnosis not present

## 2018-08-26 DIAGNOSIS — I10 Essential (primary) hypertension: Secondary | ICD-10-CM | POA: Diagnosis not present

## 2018-08-26 DIAGNOSIS — G20 Parkinson's disease: Secondary | ICD-10-CM | POA: Diagnosis not present

## 2018-08-29 ENCOUNTER — Other Ambulatory Visit: Payer: Self-pay | Admitting: Urology

## 2018-09-06 ENCOUNTER — Encounter (HOSPITAL_COMMUNITY)
Admission: RE | Admit: 2018-09-06 | Discharge: 2018-09-06 | Disposition: A | Discharge status: Home / Self Care | Payer: Medicare HMO | Source: Ambulatory Visit | Attending: Urology | Admitting: Urology

## 2018-09-09 ENCOUNTER — Ambulatory Visit (INDEPENDENT_AMBULATORY_CARE_PROVIDER_SITE_OTHER): Payer: Medicare HMO | Admitting: *Deleted

## 2018-09-09 DIAGNOSIS — I639 Cerebral infarction, unspecified: Secondary | ICD-10-CM | POA: Diagnosis not present

## 2018-09-09 NOTE — Progress Notes (Signed)
Carelink Summary Report / Loop Recorder 

## 2018-09-10 DIAGNOSIS — I10 Essential (primary) hypertension: Secondary | ICD-10-CM | POA: Diagnosis not present

## 2018-09-10 DIAGNOSIS — Z6826 Body mass index (BMI) 26.0-26.9, adult: Secondary | ICD-10-CM | POA: Diagnosis not present

## 2018-09-10 DIAGNOSIS — Z0001 Encounter for general adult medical examination with abnormal findings: Secondary | ICD-10-CM | POA: Diagnosis not present

## 2018-09-11 ENCOUNTER — Other Ambulatory Visit: Payer: Self-pay

## 2018-09-11 ENCOUNTER — Ambulatory Visit (HOSPITAL_COMMUNITY)
Admission: RE | Admit: 2018-09-11 | Discharge: 2018-09-11 | Disposition: A | Discharge status: Home / Self Care | Payer: Medicare HMO | Source: Ambulatory Visit | Attending: Urology | Admitting: Urology

## 2018-09-11 ENCOUNTER — Ambulatory Visit (HOSPITAL_COMMUNITY): Payer: Medicare HMO | Admitting: Anesthesiology

## 2018-09-11 ENCOUNTER — Encounter (HOSPITAL_COMMUNITY): Payer: Self-pay

## 2018-09-11 ENCOUNTER — Encounter (HOSPITAL_COMMUNITY)
Admission: RE | Disposition: A | Discharge status: Home / Self Care | Payer: Self-pay | Source: Ambulatory Visit | Attending: Urology

## 2018-09-11 DIAGNOSIS — G20 Parkinson's disease: Secondary | ICD-10-CM | POA: Diagnosis not present

## 2018-09-11 DIAGNOSIS — Z79899 Other long term (current) drug therapy: Secondary | ICD-10-CM | POA: Insufficient documentation

## 2018-09-11 DIAGNOSIS — N3091 Cystitis, unspecified with hematuria: Secondary | ICD-10-CM | POA: Insufficient documentation

## 2018-09-11 DIAGNOSIS — R7303 Prediabetes: Secondary | ICD-10-CM | POA: Diagnosis not present

## 2018-09-11 DIAGNOSIS — Z8673 Personal history of transient ischemic attack (TIA), and cerebral infarction without residual deficits: Secondary | ICD-10-CM | POA: Diagnosis not present

## 2018-09-11 DIAGNOSIS — E785 Hyperlipidemia, unspecified: Secondary | ICD-10-CM | POA: Insufficient documentation

## 2018-09-11 DIAGNOSIS — D414 Neoplasm of uncertain behavior of bladder: Secondary | ICD-10-CM | POA: Insufficient documentation

## 2018-09-11 DIAGNOSIS — Z881 Allergy status to other antibiotic agents status: Secondary | ICD-10-CM | POA: Diagnosis not present

## 2018-09-11 DIAGNOSIS — I10 Essential (primary) hypertension: Secondary | ICD-10-CM | POA: Insufficient documentation

## 2018-09-11 DIAGNOSIS — Z882 Allergy status to sulfonamides status: Secondary | ICD-10-CM | POA: Insufficient documentation

## 2018-09-11 DIAGNOSIS — Z7901 Long term (current) use of anticoagulants: Secondary | ICD-10-CM | POA: Insufficient documentation

## 2018-09-11 DIAGNOSIS — N3080 Other cystitis without hematuria: Secondary | ICD-10-CM | POA: Diagnosis not present

## 2018-09-11 DIAGNOSIS — C679 Malignant neoplasm of bladder, unspecified: Secondary | ICD-10-CM | POA: Diagnosis not present

## 2018-09-11 DIAGNOSIS — D494 Neoplasm of unspecified behavior of bladder: Secondary | ICD-10-CM | POA: Diagnosis not present

## 2018-09-11 HISTORY — PX: TRANSURETHRAL RESECTION OF BLADDER TUMOR: SHX2575

## 2018-09-11 SURGERY — TURBT (TRANSURETHRAL RESECTION OF BLADDER TUMOR)
Anesthesia: General

## 2018-09-11 MED ORDER — ONDANSETRON HCL 4 MG/2ML IJ SOLN: 4.0000 mg | Freq: Once | INTRAMUSCULAR | Status: DC | PRN

## 2018-09-11 MED ORDER — HYDROCODONE-ACETAMINOPHEN 5-325 MG PO TABS: 1.0000 | ORAL_TABLET | ORAL | 0 refills | Status: DC | PRN

## 2018-09-11 MED ORDER — HYDROCODONE-ACETAMINOPHEN 7.5-325 MG PO TABS: 1.0000 | ORAL_TABLET | Freq: Once | ORAL | Status: DC | PRN

## 2018-09-11 MED ORDER — MIDAZOLAM HCL 2 MG/2ML IJ SOLN
INTRAMUSCULAR | Status: AC
  Filled 2018-09-11: qty 2

## 2018-09-11 MED ORDER — FENTANYL CITRATE (PF) 100 MCG/2ML IJ SOLN
INTRAMUSCULAR | Status: AC
  Filled 2018-09-11: qty 2

## 2018-09-11 MED ORDER — KETOROLAC TROMETHAMINE 30 MG/ML IJ SOLN: 30.0000 mg | Freq: Once | INTRAMUSCULAR | Status: DC | PRN

## 2018-09-11 MED ORDER — LACTATED RINGERS IV SOLN
INTRAVENOUS | Status: DC | PRN
  Administered 2018-09-11: 12:00:00 via INTRAVENOUS

## 2018-09-11 MED ORDER — HYDROMORPHONE HCL 1 MG/ML IJ SOLN: 0.2500 mg | INTRAMUSCULAR | Status: DC | PRN

## 2018-09-11 MED ORDER — LACTATED RINGERS IV SOLN: INTRAVENOUS | Status: DC

## 2018-09-11 MED ORDER — PROPOFOL 10 MG/ML IV BOLUS
INTRAVENOUS | Status: AC
  Filled 2018-09-11: qty 20

## 2018-09-11 MED ORDER — CEFAZOLIN SODIUM-DEXTROSE 2-4 GM/100ML-% IV SOLN
INTRAVENOUS | Status: AC
  Filled 2018-09-11: qty 100

## 2018-09-11 MED ORDER — PROPOFOL 10 MG/ML IV BOLUS
INTRAVENOUS | Status: DC | PRN
  Administered 2018-09-11: 120 mg via INTRAVENOUS

## 2018-09-11 MED ORDER — SODIUM CHLORIDE 0.9 % IR SOLN
Status: DC | PRN
  Administered 2018-09-11: 3000 mL via INTRAVESICAL

## 2018-09-11 MED ORDER — CEFAZOLIN SODIUM-DEXTROSE 2-4 GM/100ML-% IV SOLN
2.0000 g | INTRAVENOUS | Status: AC
  Administered 2018-09-11: 2 g via INTRAVENOUS

## 2018-09-11 MED ORDER — MIDAZOLAM HCL 5 MG/5ML IJ SOLN
INTRAMUSCULAR | Status: DC | PRN
  Administered 2018-09-11: 1 mg via INTRAVENOUS

## 2018-09-11 MED ORDER — STERILE WATER FOR IRRIGATION IR SOLN
Status: DC | PRN
  Administered 2018-09-11: 1000 mL

## 2018-09-11 MED ORDER — MEPERIDINE HCL 50 MG/ML IJ SOLN: 6.2500 mg | INTRAMUSCULAR | Status: DC | PRN

## 2018-09-11 SURGICAL SUPPLY — 19 items
BAG DRAIN URO TABLE W/ADPT NS (BAG) ×2 IMPLANT
CLOTH BEACON ORANGE TIMEOUT ST (SAFETY) ×2 IMPLANT
ELECT LOOP 22F BIPOLAR SML (ELECTROSURGICAL) ×2
ELECT REM PT RETURN 9FT ADLT (ELECTROSURGICAL) ×2
ELECTRODE LOOP 22F BIPOLAR SML (ELECTROSURGICAL) ×1 IMPLANT
ELECTRODE REM PT RTRN 9FT ADLT (ELECTROSURGICAL) ×1 IMPLANT
GLOVE BIO SURGEON STRL SZ8 (GLOVE) ×2 IMPLANT
GLOVE BIOGEL PI IND STRL 7.0 (GLOVE) ×2 IMPLANT
GLOVE BIOGEL PI INDICATOR 7.0 (GLOVE) ×2
GLOVE ECLIPSE 6.5 STRL STRAW (GLOVE) ×2 IMPLANT
GOWN STRL REUS W/ TWL LRG LVL3 (GOWN DISPOSABLE) ×1 IMPLANT
GOWN STRL REUS W/TWL LRG LVL3 (GOWN DISPOSABLE) ×1
GOWN STRL REUS W/TWL XL LVL3 (GOWN DISPOSABLE) ×2 IMPLANT
IV NS IRRIG 3000ML ARTHROMATIC (IV SOLUTION) ×2 IMPLANT
KIT TURNOVER CYSTO (KITS) ×2 IMPLANT
PACK CYSTO (CUSTOM PROCEDURE TRAY) ×2 IMPLANT
PAD ARMBOARD 7.5X6 YLW CONV (MISCELLANEOUS) ×2 IMPLANT
SYR 30ML LL (SYRINGE) ×2 IMPLANT
TOWEL OR 17X26 4PK STRL BLUE (TOWEL DISPOSABLE) ×2 IMPLANT

## 2018-09-11 NOTE — Transfer of Care (Signed)
Immediate Anesthesia Transfer of Care Note  Patient: Mike Nixon  Procedure(s) Performed: TRANSURETHRAL RESECTION OF BLADDER TUMOR (TURBT) (N/A )  Patient Location: PACU  Anesthesia Type:General  Level of Consciousness: drowsy  Airway & Oxygen Therapy: Patient Spontanous Breathing and Patient connected to nasal cannula oxygen  Post-op Assessment: Report given to RN and Post -op Vital signs reviewed and stable  Post vital signs: Reviewed and stable  Last Vitals:  Vitals Value Taken Time  BP    Temp    Pulse    Resp    SpO2      Last Pain:  Vitals:   09/11/18 1135  TempSrc: Oral  PainSc: 0-No pain      Patients Stated Pain Goal: 3 (26/71/24 5809)  Complications: No apparent anesthesia complications

## 2018-09-11 NOTE — H&P (Signed)
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 No LUTS  Past Medical History:  Diagnosis Date  . Anticoagulant long-term use    PLAVIX  . Benign localized prostatic hyperplasia with lower urinary tract symptoms (LUTS)   . Bladder tumor   . Borderline diabetes    watches diet  . ED (erectile dysfunction) of organic origin   . Gait disturbance, post-stroke    uses wheelchair long distance  . Glaucoma, right eye   . History of bleeding peptic ulcer    03/ 1979  s/p  surgery  . History of concussion    1987 no loc-- no residual  . History of CVA with residual deficit    05/ 2016 CYPTOGENIC CVA --- RESIDUAL GAIT DISTURBANCE AND SIALORRHEA ACCESS (DROOLING)  . Hyperlipidemia   . Hypertension   . Mild atherosclerosis of both carotid arteries    per duplex from outside facitlity 04-08-2015 less then 50% stenosis bilateral ICA  . Parkinson's disease [facility]) neurologist -- dr r. Mitzi Hansen falconer ([place] [facility])   dx 2015  . PBA (pseudobulbar affect)    laughter-- residual from cva 05/ 2016  . PFO (patent foramen ovale)    per TEE 08-18-2016  . Speech impairment    post-stroke  . Status post placement of implantable loop recorder 08/18/2016   placed by dr allred  . Wears glasses    Past Surgical History:  Procedure Laterality Date  . APPENDECTOMY  1978  . CATARACT EXTRACTION W/ INTRAOCULAR LENS IMPLANT Right 2014 approx.  . CYSTOSCOPY W/ URETERAL STENT PLACEMENT Bilateral 05/07/2017   Procedure: CYSTOSCOPY WITH RETROGRADE PYELOGRAM/URETERAL;  Surgeon: Cleon Gustin, MD;  Location: [facility];  Service: Urology;  Laterality: Bilateral;  . EP IMPLANTABLE DEVICE N/A 08/18/2016   Procedure: Loop Recorder Insertion;  Surgeon: Thompson Grayer, MD;  Location: [place] [facility];   Service: Cardiovascular;  Laterality: N/A;  . REMOVAL NODULES BILATERAL FLANK  2008 approx.   per pt benign fatty tissue  . REPAIR OF PERFORATED ULCER  01/1978   bleeding peptic ulcer  . TEE WITHOUT CARDIOVERSION N/A 08/18/2016   Procedure: TRANSESOPHAGEAL ECHOCARDIOGRAM (TEE);  Surgeon: Sueanne Margarita, MD;  Location: [facility] ENDOSCOPY;  Service: Cardiovascular;  Laterality: N/A; no evidence thrombus;  mobile atrial septum w/ positive bubble study consistent with PFO/ trivial AR, mild TR  . TRANSTHORACIC ECHOCARDIOGRAM  04/09/2015   ef 79-89%, grade 1 diastolic dysfunction/  mild AV sclerosis without stenosis/  mild TR,  trivial Mike and PR  . TRANSURETHRAL RESECTION OF BLADDER TUMOR N/A 05/07/2017   Procedure: TRANSURETHRAL RESECTION OF BLADDER TUMOR (TURBT);  Surgeon: Cleon Gustin, MD;  Location: [facility];  Service: Urology;  Laterality: N/A;  . TRANSURETHRAL RESECTION OF BLADDER TUMOR N/A 06/18/2017   Procedure: TRANSURETHRAL RESECTION OF BLADDER TUMOR (TURBT);  Surgeon: Cleon Gustin, MD;  Location: [facility];  Service: Urology;  Laterality: N/A;  . TRANSURETHRAL RESECTION OF PROSTATE  08-20-2013   at [facility] Medications:  Current Facility-Administered Medications  Medication Dose Route Frequency Provider Last Rate Last Dose  . ceFAZolin (ANCEF) IVPB 2g/100 mL premix  2 g Intravenous 30 min Pre-Op Candon Caras, Candee Furbish, MD      . sterile water for irrigation for irrigation    PRN Mirren Gest,  Candee Furbish, MD   1,000 mL at 09/11/18 1245   Facility-Administered Medications Ordered in Other Encounters  Medication Dose Route Frequency Provider Last Rate Last Dose  . lactated ringers infusion    Continuous PRN Charmaine Downs, CRNA       Allergies:  Allergies  Allergen Reactions  . Benazepril Cough  . Doxycycline Other (See Comments)    Unknown  . Levofloxacin Hives and Swelling  . Simvastatin Other (See Comments)    Pt does not remember what the reaction was.  ?muscle aches  . Tamsulosin Hives and Swelling  . Tramadol Hcl Hives and Swelling  . Sulfa Antibiotics Hives, Swelling, Rash and Other (See Comments)    Severe rash    Family History  Problem Relation Age of Onset  . Stroke Sister    Social History:  reports that he has never smoked. He has never used smokeless tobacco. He reports that he does not drink alcohol or use drugs.  Review of Systems  All other systems reviewed and are negative.   Physical Exam:  Vital signs in last 24 hours: Temp:  [97.7 F (36.5 C)] 97.7 F (36.5 C) (10/16 1135) Pulse Rate:  [53] 53 (10/16 1135) Resp:  [21] 21 (10/16 1135) BP: (173)/(93) 173/93 (10/16 1135) SpO2:  [95 %] 95 % (10/16 1135) Weight:  [76.2 kg] 76.2 kg (10/16 1135) Physical Exam  Constitutional: He is oriented to person, place, and time. He appears well-developed and well-nourished.  HENT:  Head: Normocephalic and atraumatic.  Eyes: Pupils are equal, round, and reactive to light. EOM are normal.  Neck: Normal range of motion. No thyromegaly present.  Cardiovascular: Normal rate and regular rhythm.  Respiratory: Effort normal. No respiratory distress.  GI: Soft. He exhibits no distension.  Musculoskeletal: Normal range of motion. He exhibits no edema.  Neurological: He is alert and oriented to person, place, and time.  Skin: Skin is warm and dry.  Psychiatric: He has a normal mood and affect. His behavior is normal. Judgment and thought content normal.    Laboratory Data:  No results found for this or any previous visit (from the past 24 hour(s)). No results found for this or any previous visit (from the past 240 hour(s)). Creatinine: No results for input(s): CREATININE in the last 168 hours. Baseline Creatinine: unknwon  Impression/Assessment:  77yo with bladder cancer  Plan:  The risks/benefits/alernatves to TURBt was explained to the patient and he understands and wishes to proceed with surgery  Nicolette Bang 09/11/2018, 12:47 PM

## 2018-09-11 NOTE — Op Note (Signed)
.  Preoperative diagnosis: bladder tumor  Postoperative diagnosis: Same  Procedure: 1 cystoscopy 2. Transurethral resection of bladder tumor, medium  Attending: Rosie Fate  Anesthesia: General  Estimated blood loss: Minimal  Drains: none  Specimens: prostatic urethra tumor  Antibiotics: ancef  Findings: 3cm papillary tumor involving left prostate lobe.  Ureteral orifices in normal anatomic location.   Indications: Patient is a 77 year old male with a history of bladder tumor and gross hematuria.  After discussing treatment options, they decided proceed with transurethral resection of a bladder tumor.  Procedure her in detail: The patient was brought to the operating room and a brief timeout was done to ensure correct patient, correct procedure, correct site.  General anesthesia was administered patient was placed in dorsal lithotomy position.  Their genitalia was then prepped and draped in usual sterile fashion.  A rigid 61 French cystoscope was passed in the urethra and the bladder.  Bladder was inspected and we noted a 3cm tumor in the prostatic urethra.  the ureteral orifices were in the normal orthotopic locations.  Using the bipolar resectoscope we removed the  tumor down to the base.  Hemostasis was then obtained with electrocautery. We then removed the bladder tumor chips and sent them for pathology. We then re-inspected the bladder and found no residual bleeding.  the bladder was then drained, and this concluded the procedure which was well tolerated by patient.  Complications: None  Condition: Stable, extubated, transferred to PACU  Plan: Patient is to be discharged home and followup in 5 days for a pathology discussion.

## 2018-09-11 NOTE — Anesthesia Preprocedure Evaluation (Signed)
Anesthesia Evaluation  Patient identified by MRN, date of birth, ID band Patient awake    Reviewed: Allergy & Precautions, H&P , NPO status , Patient's Chart, lab work & pertinent test results, reviewed documented beta blocker date and time   Airway Mallampati: II  TM Distance: >3 FB Neck ROM: full    Dental no notable dental hx. (+) Teeth Intact   Pulmonary neg pulmonary ROS,    Pulmonary exam normal breath sounds clear to auscultation       Cardiovascular Exercise Tolerance: Good hypertension, negative cardio ROS   Rhythm:regular Rate:Normal     Neuro/Psych Parkinson's disease CVA negative psych ROS   GI/Hepatic negative GI ROS, Neg liver ROS,   Endo/Other  negative endocrine ROS  Renal/GU negative Renal ROS  negative genitourinary   Musculoskeletal   Abdominal   Peds  Hematology negative hematology ROS (+)   Anesthesia Other Findings   Reproductive/Obstetrics negative OB ROS                             Anesthesia Physical Anesthesia Plan  ASA: III  Anesthesia Plan: General   Post-op Pain Management:    Induction:   PONV Risk Score and Plan:   Airway Management Planned:   Additional Equipment:   Intra-op Plan:   Post-operative Plan:   Informed Consent: I have reviewed the patients History and Physical, chart, labs and discussed the procedure including the risks, benefits and alternatives for the proposed anesthesia with the patient or authorized representative who has indicated his/her understanding and acceptance.   Dental Advisory Given  Plan Discussed with: CRNA  Anesthesia Plan Comments:         Anesthesia Quick Evaluation

## 2018-09-11 NOTE — Anesthesia Postprocedure Evaluation (Signed)
Anesthesia Post Note  Patient: Mike Nixon  Procedure(s) Performed: TRANSURETHRAL RESECTION OF BLADDER TUMOR (TURBT) (N/A )  Patient location during evaluation: PACU Anesthesia Type: General Level of consciousness: awake and alert and patient cooperative Pain management: satisfactory to patient Vital Signs Assessment: post-procedure vital signs reviewed and stable Respiratory status: spontaneous breathing Cardiovascular status: stable Postop Assessment: no apparent nausea or vomiting Anesthetic complications: no     Last Vitals:  Vitals:   09/11/18 1400 09/11/18 1415  BP: (!) 159/101 (!) 165/102  Pulse: (!) 57 (!) 58  Resp: 17 13  Temp:    SpO2: 95% 98%    Last Pain:  Vitals:   09/11/18 1415  TempSrc:   PainSc: (P) 0-No pain                 Clinten Howk

## 2018-09-11 NOTE — Discharge Instructions (Signed)
Transurethral Resection of Bladder Tumor, Care After °Refer to this sheet in the next few weeks. These instructions provide you with information about caring for yourself after your procedure. Your health care provider may also give you more specific instructions. Your treatment has been planned according to current medical practices, but problems sometimes occur. Call your health care provider if you have any problems or questions after your procedure. °What can I expect after the procedure? °After the procedure, it is common to have: °· A small amount of blood in your urine for up to 2 weeks. °· Soreness or mild discomfort from your catheter. After your catheter is removed, you may have mild soreness, especially when urinating. °· Pain in your lower abdomen. ° °Follow these instructions at home: ° °Medicines °· Take over-the-counter and prescription medicines only as told by your health care provider. °· Do not drive or operate heavy machinery while taking prescription pain medicine. °· Do not drive for 24 hours if you received a sedative. °· If you were prescribed antibiotic medicine, take it as told by your health care provider. Do not stop taking the antibiotic even if you start to feel better. °Activity °· Return to your normal activities as told by your health care provider. Ask your health care provider what activities are safe for you. °· Do not lift anything that is heavier than 10 lb (4.5 kg) for as long as told by your health care provider. °· Avoid intense physical activity for as long as told by your health care provider. °· Walk at least one time every day. This helps to prevent blood clots. You may increase your physical activity gradually as you start to feel better. °General instructions °· Do not drink alcohol for as long as told by your health care provider. This is especially important if you are taking prescription pain medicines. °· Do not take baths, swim, or use a hot tub until your health  care provider approves. °· If you have a catheter, follow instructions from your health care provider about caring for your catheter and your drainage bag. °· Drink enough fluid to keep your urine clear or pale yellow. °· Wear compression stockings as told by your health care provider. These stockings help to prevent blood clots and reduce swelling in your legs. °· Keep all follow-up visits as told by your health care provider. This is important. °Contact a health care provider if: °· You have pain that gets worse or does not improve with medicine. °· You have blood in your urine for more than 2 weeks. °· You have cloudy or bad-smelling urine. °· You become constipated. Signs of constipation may include having: °? Fewer than three bowel movements in a week. °? Difficulty having a bowel movement. °? Stools that are dry, hard, or larger than normal. °· You have a fever. °Get help right away if: °· You have: °? Severe pain. °? Bright-red blood in your urine. °? Blood clots in your urine. °? A lot of blood in your urine. °· Your catheter has been removed and you are not able to urinate. °· You have a catheter in place and the catheter is not draining urine. °This information is not intended to replace advice given to you by your health care provider. Make sure you discuss any questions you have with your health care provider. °Document Released: 10/25/2015 Document Revised: 07/16/2016 Document Reviewed: 08/05/2015 °Elsevier Interactive Patient Education © 2018 [employer] ° °General Anesthesia, Adult, Care After °These instructions   provide you with information about caring for yourself after your procedure. Your health care provider may also give you more specific instructions. Your treatment has been planned according to current medical practices, but problems sometimes occur. Call your health care provider if you have any problems or questions after your procedure. °What can I expect after the procedure? °After the  procedure, it is common to have: °· Vomiting. °· A sore throat. °· Mental slowness. ° °It is common to feel: °· Nauseous. °· Cold or shivery. °· Sleepy. °· Tired. °· Sore or achy, even in parts of your body where you did not have surgery. ° °Follow these instructions at home: °For at least 24 hours after the procedure: °· Do not: °? Participate in activities where you could fall or become injured. °? Drive. °? Use heavy machinery. °? Drink alcohol. °? Take sleeping pills or medicines that cause drowsiness. °? Make important decisions or sign legal documents. °? Take care of children on your own. °· Rest. °Eating and drinking °· If you vomit, drink water, juice, or soup when you can drink without vomiting. °· Drink enough fluid to keep your urine clear or pale yellow. °· Make sure you have little or no nausea before eating solid foods. °· Follow the diet recommended by your health care provider. °General instructions °· Have a responsible adult stay with you until you are awake and alert. °· Return to your normal activities as told by your health care provider. Ask your health care provider what activities are safe for you. °· Take over-the-counter and prescription medicines only as told by your health care provider. °· If you smoke, do not smoke without supervision. °· Keep all follow-up visits as told by your health care provider. This is important. °Contact a health care provider if: °· You continue to have nausea or vomiting at home, and medicines are not helpful. °· You cannot drink fluids or start eating again. °· You cannot urinate after 8-12 hours. °· You develop a skin rash. °· You have fever. °· You have increasing redness at the site of your procedure. °Get help right away if: °· You have difficulty breathing. °· You have chest pain. °· You have unexpected bleeding. °· You feel that you are having a life-threatening or urgent problem. °This information is not intended to replace advice given to you by your  health care provider. Make sure you discuss any questions you have with your health care provider. °Document Released: 02/19/2001 Document Revised: 04/17/2016 Document Reviewed: 10/28/2015 °Elsevier Interactive Patient Education © 2018 [employer] Inc[employer] ° °

## 2018-09-12 ENCOUNTER — Encounter (HOSPITAL_COMMUNITY): Payer: Self-pay | Admitting: Urology

## 2018-09-23 LAB — CUP PACEART REMOTE DEVICE CHECK
Date Time Interrogation Session: 20191014154104
Implantable Pulse Generator Implant Date: 20170922

## 2018-09-25 ENCOUNTER — Ambulatory Visit (INDEPENDENT_AMBULATORY_CARE_PROVIDER_SITE_OTHER): Payer: Medicare HMO | Admitting: Urology

## 2018-09-25 DIAGNOSIS — C673 Malignant neoplasm of anterior wall of bladder: Secondary | ICD-10-CM | POA: Diagnosis not present

## 2018-10-03 ENCOUNTER — Telehealth: Payer: Self-pay

## 2018-10-03 NOTE — Telephone Encounter (Signed)
Spoke w/ pt and requested that he send a manual transmission b/c his home monitor has not updated in at least 14 days.   

## 2018-10-11 ENCOUNTER — Emergency Department (HOSPITAL_COMMUNITY): Payer: Medicare HMO

## 2018-10-11 ENCOUNTER — Observation Stay (HOSPITAL_COMMUNITY)
Admission: EM | Admit: 2018-10-11 | Discharge: 2018-10-12 | Disposition: A | Discharge status: Home / Self Care | Payer: Medicare HMO | Attending: Emergency Medicine | Admitting: Emergency Medicine

## 2018-10-11 ENCOUNTER — Encounter (HOSPITAL_COMMUNITY): Payer: Self-pay | Admitting: Emergency Medicine

## 2018-10-11 ENCOUNTER — Other Ambulatory Visit: Payer: Self-pay

## 2018-10-11 DIAGNOSIS — R079 Chest pain, unspecified: Secondary | ICD-10-CM

## 2018-10-11 DIAGNOSIS — G20 Parkinson's disease: Secondary | ICD-10-CM | POA: Diagnosis not present

## 2018-10-11 DIAGNOSIS — Z7902 Long term (current) use of antithrombotics/antiplatelets: Secondary | ICD-10-CM | POA: Insufficient documentation

## 2018-10-11 DIAGNOSIS — N3091 Cystitis, unspecified with hematuria: Secondary | ICD-10-CM

## 2018-10-11 DIAGNOSIS — R109 Unspecified abdominal pain: Secondary | ICD-10-CM

## 2018-10-11 DIAGNOSIS — E785 Hyperlipidemia, unspecified: Secondary | ICD-10-CM | POA: Insufficient documentation

## 2018-10-11 DIAGNOSIS — R0789 Other chest pain: Secondary | ICD-10-CM | POA: Diagnosis not present

## 2018-10-11 DIAGNOSIS — Z8673 Personal history of transient ischemic attack (TIA), and cerebral infarction without residual deficits: Secondary | ICD-10-CM | POA: Insufficient documentation

## 2018-10-11 DIAGNOSIS — Z79899 Other long term (current) drug therapy: Secondary | ICD-10-CM | POA: Insufficient documentation

## 2018-10-11 DIAGNOSIS — J181 Lobar pneumonia, unspecified organism: Secondary | ICD-10-CM | POA: Diagnosis not present

## 2018-10-11 DIAGNOSIS — I10 Essential (primary) hypertension: Secondary | ICD-10-CM | POA: Diagnosis not present

## 2018-10-11 DIAGNOSIS — I639 Cerebral infarction, unspecified: Secondary | ICD-10-CM | POA: Diagnosis present

## 2018-10-11 DIAGNOSIS — J189 Pneumonia, unspecified organism: Secondary | ICD-10-CM | POA: Insufficient documentation

## 2018-10-11 DIAGNOSIS — R319 Hematuria, unspecified: Secondary | ICD-10-CM | POA: Diagnosis not present

## 2018-10-11 DIAGNOSIS — Z23 Encounter for immunization: Secondary | ICD-10-CM | POA: Insufficient documentation

## 2018-10-11 DIAGNOSIS — J90 Pleural effusion, not elsewhere classified: Secondary | ICD-10-CM | POA: Diagnosis not present

## 2018-10-11 LAB — CBC WITH DIFFERENTIAL/PLATELET
Abs Immature Granulocytes: 0.02 10*3/uL (ref 0.00–0.07)
Basophils Absolute: 0 10*3/uL (ref 0.0–0.1)
Basophils Relative: 1 %
Eosinophils Absolute: 0.2 10*3/uL (ref 0.0–0.5)
Eosinophils Relative: 4 %
HCT: 44.6 % (ref 39.0–52.0)
Hemoglobin: 13.6 g/dL (ref 13.0–17.0)
Immature Granulocytes: 0 %
Lymphocytes Relative: 30 %
Lymphs Abs: 1.9 10*3/uL (ref 0.7–4.0)
MCH: 25.3 pg — ABNORMAL LOW (ref 26.0–34.0)
MCHC: 30.5 g/dL (ref 30.0–36.0)
MCV: 82.9 fL (ref 80.0–100.0)
Monocytes Absolute: 0.5 10*3/uL (ref 0.1–1.0)
Monocytes Relative: 8 %
Neutro Abs: 3.5 10*3/uL (ref 1.7–7.7)
Neutrophils Relative %: 57 %
Platelets: 195 10*3/uL (ref 150–400)
RBC: 5.38 MIL/uL (ref 4.22–5.81)
RDW: 13.7 % (ref 11.5–15.5)
WBC: 6.2 10*3/uL (ref 4.0–10.5)
nRBC: 0 % (ref 0.0–0.2)

## 2018-10-11 LAB — COMPREHENSIVE METABOLIC PANEL
ALT: 6 U/L (ref 0–44)
AST: 13 U/L — ABNORMAL LOW (ref 15–41)
Albumin: 3.8 g/dL (ref 3.5–5.0)
Alkaline Phosphatase: 63 U/L (ref 38–126)
Anion gap: 7 (ref 5–15)
BUN: 13 mg/dL (ref 8–23)
CO2: 28 mmol/L (ref 22–32)
Calcium: 9.2 mg/dL (ref 8.9–10.3)
Chloride: 104 mmol/L (ref 98–111)
Creatinine, Ser: 1.11 mg/dL (ref 0.61–1.24)
GFR calc Af Amer: >60 mL/min (ref >60)
GFR calc non Af Amer: >60 mL/min (ref >60)
Glucose, Bld: 110 mg/dL — ABNORMAL HIGH (ref 70–99)
Potassium: 4.1 mmol/L (ref 3.5–5.1)
Sodium: 139 mmol/L (ref 135–145)
Total Bilirubin: 0.9 mg/dL (ref 0.3–1.2)
Total Protein: 6.9 g/dL (ref 6.5–8.1)

## 2018-10-11 LAB — URINALYSIS, ROUTINE W REFLEX MICROSCOPIC
Glucose, UA: NEGATIVE mg/dL
Nitrite: POSITIVE — AB
Protein, ur: 100 mg/dL — AB
Specific Gravity, Urine: 1.025 (ref 1.005–1.030)
pH: 5.5 (ref 5.0–8.0)

## 2018-10-11 LAB — PROTIME-INR
INR: 1.16
Prothrombin Time: 14.7 seconds (ref 11.4–15.2)

## 2018-10-11 LAB — TROPONIN I
Troponin I: <0.03 ng/mL (ref <0.03)
Troponin I: <0.03 ng/mL (ref <0.03)
Troponin I: <0.03 ng/mL (ref <0.03)
Troponin I: <0.03 ng/mL (ref <0.03)

## 2018-10-11 LAB — LACTIC ACID, PLASMA
Lactic Acid, Venous: 1.5 mmol/L (ref 0.5–1.9)
Lactic Acid, Venous: 2.5 mmol/L (ref 0.5–1.9)
Lactic Acid, Venous: 1.8 mmol/L (ref 0.5–1.9)
Lactic Acid, Venous: 2 mmol/L (ref 0.5–1.9)

## 2018-10-11 LAB — URINALYSIS, MICROSCOPIC (REFLEX)
RBC / HPF: >50 RBC/hpf (ref 0–5)
Squamous Epithelial / LPF: NONE SEEN (ref 0–5)

## 2018-10-11 LAB — LIPASE, BLOOD: Lipase: 23 U/L (ref 11–51)

## 2018-10-11 MED ORDER — SODIUM CHLORIDE 0.9 % IV SOLN
INTRAVENOUS | Status: DC
  Administered 2018-10-11 – 2018-10-12 (×2): via INTRAVENOUS

## 2018-10-11 MED ORDER — ONDANSETRON HCL 4 MG/2ML IJ SOLN: 4.0000 mg | Freq: Four times a day (QID) | INTRAMUSCULAR | Status: DC | PRN

## 2018-10-11 MED ORDER — METOPROLOL SUCCINATE ER 50 MG PO TB24: 50.0000 mg | ORAL_TABLET | Freq: Every evening | ORAL | Status: DC

## 2018-10-11 MED ORDER — CARBIDOPA-LEVODOPA ER 36.25-145 MG PO CPCR
3.0000 | ORAL_CAPSULE | Freq: Four times a day (QID) | ORAL | Status: DC
  Administered 2018-10-11 – 2018-10-12 (×4): 3 via ORAL

## 2018-10-11 MED ORDER — ATORVASTATIN CALCIUM 40 MG PO TABS
40.0000 mg | ORAL_TABLET | Freq: Every evening | ORAL | Status: DC
  Administered 2018-10-11: 40 mg via ORAL
  Filled 2018-10-11: qty 1

## 2018-10-11 MED ORDER — ASPIRIN 81 MG PO CHEW
324.0000 mg | CHEWABLE_TABLET | Freq: Once | ORAL | Status: AC
  Administered 2018-10-11: 324 mg via ORAL
  Filled 2018-10-11: qty 4

## 2018-10-11 MED ORDER — PNEUMOCOCCAL VAC POLYVALENT 25 MCG/0.5ML IJ INJ: 0.5000 mL | INJECTION | INTRAMUSCULAR | Status: DC

## 2018-10-11 MED ORDER — FUROSEMIDE 20 MG PO TABS
20.0000 mg | ORAL_TABLET | Freq: Every day | ORAL | Status: DC
  Administered 2018-10-11: 20 mg via ORAL
  Filled 2018-10-11 (×2): qty 1

## 2018-10-11 MED ORDER — IOPAMIDOL (ISOVUE-300) INJECTION 61%
30.0000 mL | Freq: Once | INTRAVENOUS | Status: AC | PRN
  Administered 2018-10-11: 30 mL via ORAL

## 2018-10-11 MED ORDER — SODIUM CHLORIDE 0.9 % IV SOLN
1.0000 g | Freq: Once | INTRAVENOUS | Status: DC
  Filled 2018-10-11: qty 10

## 2018-10-11 MED ORDER — MORPHINE SULFATE (PF) 2 MG/ML IV SOLN
2.0000 mg | INTRAVENOUS | Status: DC | PRN
  Administered 2018-10-12: 2 mg via INTRAVENOUS
  Filled 2018-10-11: qty 1

## 2018-10-11 MED ORDER — IPRATROPIUM-ALBUTEROL 0.5-2.5 (3) MG/3ML IN SOLN: 3.0000 mL | Freq: Four times a day (QID) | RESPIRATORY_TRACT | Status: DC | PRN

## 2018-10-11 MED ORDER — MECLIZINE HCL 12.5 MG PO TABS: 25.0000 mg | ORAL_TABLET | Freq: Three times a day (TID) | ORAL | Status: DC | PRN

## 2018-10-11 MED ORDER — SODIUM CHLORIDE 0.9 % IV SOLN
1.0000 g | INTRAVENOUS | Status: DC
  Administered 2018-10-11 – 2018-10-12 (×2): 1 g via INTRAVENOUS
  Filled 2018-10-11: qty 1
  Filled 2018-10-11 (×2): qty 10

## 2018-10-11 MED ORDER — ACETAMINOPHEN 325 MG PO TABS
650.0000 mg | ORAL_TABLET | ORAL | Status: DC | PRN
  Administered 2018-10-11: 650 mg via ORAL
  Filled 2018-10-11 (×2): qty 2

## 2018-10-11 MED ORDER — INFLUENZA VAC SPLIT HIGH-DOSE 0.5 ML IM SUSY
0.5000 mL | PREFILLED_SYRINGE | INTRAMUSCULAR | Status: AC
  Administered 2018-10-12: 0.5 mL via INTRAMUSCULAR
  Filled 2018-10-11: qty 0.5

## 2018-10-11 MED ORDER — METOPROLOL SUCCINATE ER 50 MG PO TB24
100.0000 mg | ORAL_TABLET | Freq: Every evening | ORAL | Status: DC
  Filled 2018-10-11: qty 2

## 2018-10-11 MED ORDER — MORPHINE SULFATE (PF) 4 MG/ML IV SOLN
4.0000 mg | INTRAVENOUS | Status: DC | PRN
  Administered 2018-10-11: 4 mg via INTRAVENOUS
  Filled 2018-10-11: qty 1

## 2018-10-11 MED ORDER — HYDROCODONE-ACETAMINOPHEN 5-325 MG PO TABS
1.0000 | ORAL_TABLET | Freq: Four times a day (QID) | ORAL | Status: DC | PRN
  Administered 2018-10-12: 1 via ORAL
  Filled 2018-10-11 (×2): qty 1

## 2018-10-11 MED ORDER — SODIUM CHLORIDE 0.9 % IV BOLUS
500.0000 mL | Freq: Once | INTRAVENOUS | Status: AC
  Administered 2018-10-11: 500 mL via INTRAVENOUS

## 2018-10-11 MED ORDER — ONDANSETRON HCL 4 MG/2ML IJ SOLN
4.0000 mg | INTRAMUSCULAR | Status: DC | PRN
  Administered 2018-10-11: 4 mg via INTRAVENOUS
  Filled 2018-10-11: qty 2

## 2018-10-11 MED ORDER — NITROGLYCERIN 0.4 MG SL SUBL
0.4000 mg | SUBLINGUAL_TABLET | SUBLINGUAL | Status: AC | PRN
  Administered 2018-10-11 (×3): 0.4 mg via SUBLINGUAL
  Filled 2018-10-11: qty 1

## 2018-10-11 MED ORDER — IOPAMIDOL (ISOVUE-300) INJECTION 61%
100.0000 mL | Freq: Once | INTRAVENOUS | Status: AC | PRN
  Administered 2018-10-11: 100 mL via INTRAVENOUS

## 2018-10-11 MED ORDER — SODIUM CHLORIDE 0.9 % IV SOLN
500.0000 mg | INTRAVENOUS | Status: DC
  Administered 2018-10-11 – 2018-10-12 (×2): 500 mg via INTRAVENOUS
  Filled 2018-10-11 (×4): qty 500

## 2018-10-11 MED ORDER — BUDESONIDE 0.25 MG/2ML IN SUSP
0.2500 mg | Freq: Two times a day (BID) | RESPIRATORY_TRACT | Status: DC
  Administered 2018-10-11 – 2018-10-12 (×2): 0.25 mg via RESPIRATORY_TRACT
  Filled 2018-10-11 (×8): qty 2

## 2018-10-11 NOTE — ED Notes (Signed)
Pt's family called another RN to room saying that pt was c/o chest pain. Pt describes the pain as "feels like bricks sitting on my chest". Pt denies radiation of pain. Pt does report SOB, dizziness and nausea. EKG performed. Dr. Thurnell Garbe notified.

## 2018-10-11 NOTE — H&P (Signed)
History and Physical    Mike Nixon IHW:[id number] DOB: 1941/07/14 DOA: 10/11/2018  Referring MD/NP/PA: Dr. Thurnell Garbe PCP: [name], MD  Patient coming from: Home  Chief Complaint: Hematuria  HPI: Mike Nixon is a 77 y.o. male with a PMH of high grade bladder cancer, S/P TURBT (done x 3, latest on 09/11/2018), hypertension, hyperlipidemia, Parkinson's disease, CVA (2016), who came to the ED complaining of painless hematuria that had been going on since Tuesday (10/08/18). The patient also states that he has had some mid lower abdominal discomfort, and some general weakness for the past few days as well. While in the ED, the patient began having some chest discomfort, which he describes as 4/10 in intensity, stabbing, constant, non radiating, with no alleviating or aggravating factors. He also states that he has been short of breath on exertion, and had some night sweats and chills for the past few days. Work up in the ED involved a chest x ray and demonstrated airspace consolidation consistent with pneumonia in the left lower lobe with small left pleural effusion.  Patient denies headache, neck pain, difficulty swallowing, palpitations, constipation, dysuria, hematochezia, cough, melena.   T- 98 F, RR- 17, HR- 51, BP- 124/79, Sat O2 100% on 1-2L [place]; Lactic acid at 2.0, troponin's negative x 2, 2 EKG's without acute ischemic changes.  In the ED the patient was given a bolus of 0.9% NS infusion, X1 morphine/NTG and was started on Rocephin and Zithromax. UA showed Many bacteria. A urine culture was obtained as well.  CT scan of abd and pelvis showed no prostatic fluid collection, abscess, or other intrapelvic abnormality by CT. No other acute intra-abdominal or pelvic finding.   Past Medical/Surgical History: Past Medical History:  Diagnosis Date  . Anticoagulant long-term use    PLAVIX  . Benign localized prostatic hyperplasia with lower urinary tract symptoms (LUTS)   . Bladder tumor    . Borderline diabetes    watches diet  . ED (erectile dysfunction) of organic origin   . Gait disturbance, post-stroke    uses wheelchair long distance  . Glaucoma, right eye   . History of bleeding peptic ulcer    03/ 1979  s/p  surgery  . History of concussion    1987 no loc-- no residual  . History of CVA with residual deficit    05/ 2016 CYPTOGENIC CVA --- RESIDUAL GAIT DISTURBANCE AND SIALORRHEA ACCESS (DROOLING)  . Hyperlipidemia   . Hypertension   . Mild atherosclerosis of both carotid arteries    per duplex from outside facitlity 04-08-2015 less then 50% stenosis bilateral ICA  . Parkinson's disease [facility]) neurologist -- dr r. Mitzi Hansen falconer ([facility], [place] [facility])   dx 2015  . PBA (pseudobulbar affect)    laughter-- residual from cva 05/ 2016  . PFO (patent foramen ovale)    per TEE 08-18-2016  . Speech impairment    post-stroke  . Status post placement of implantable loop recorder 08/18/2016   placed by dr allred  . Wears glasses     Past Surgical History:  Procedure Laterality Date  . APPENDECTOMY  1978  . CATARACT EXTRACTION W/ INTRAOCULAR LENS IMPLANT Right 2014 approx.  . CYSTOSCOPY W/ URETERAL STENT PLACEMENT Bilateral 05/07/2017   Procedure: CYSTOSCOPY WITH RETROGRADE PYELOGRAM/URETERAL;  Surgeon: Cleon Gustin, MD;  Location: [facility];  Service: Urology;  Laterality: Bilateral;  . EP IMPLANTABLE DEVICE N/A 08/18/2016   Procedure: Loop Recorder Insertion;  Surgeon: Thompson Grayer, MD;  Location: [place] [facility];  Service: Cardiovascular;  Laterality: N/A;  . REMOVAL NODULES BILATERAL FLANK  2008 approx.   per pt benign fatty tissue  . REPAIR OF PERFORATED ULCER  01/1978   bleeding peptic ulcer  . TEE WITHOUT CARDIOVERSION N/A 08/18/2016   Procedure: TRANSESOPHAGEAL ECHOCARDIOGRAM (TEE);  Surgeon: Sueanne Margarita, MD;  Location: [facility];  Service: Cardiovascular;  Laterality: N/A; no evidence thrombus;   mobile atrial septum w/ positive bubble study consistent with PFO/ trivial AR, mild TR  . TRANSTHORACIC ECHOCARDIOGRAM  04/09/2015   ef 53-97%, grade 1 diastolic dysfunction/  mild AV sclerosis without stenosis/  mild TR,  trivial MR and PR  . TRANSURETHRAL RESECTION OF BLADDER TUMOR N/A 05/07/2017   Procedure: TRANSURETHRAL RESECTION OF BLADDER TUMOR (TURBT);  Surgeon: Cleon Gustin, MD;  Location: [facility];  Service: Urology;  Laterality: N/A;  . TRANSURETHRAL RESECTION OF BLADDER TUMOR N/A 06/18/2017   Procedure: TRANSURETHRAL RESECTION OF BLADDER TUMOR (TURBT);  Surgeon: Cleon Gustin, MD;  Location: [facility];  Service: Urology;  Laterality: N/A;  . TRANSURETHRAL RESECTION OF BLADDER TUMOR N/A 09/11/2018   Procedure: TRANSURETHRAL RESECTION OF BLADDER TUMOR (TURBT);  Surgeon: Cleon Gustin, MD;  Location: [facility];  Service: Urology;  Laterality: N/A;  . TRANSURETHRAL RESECTION OF PROSTATE  08-20-2013   at [place] History:  reports that he has never smoked. He has never used smokeless tobacco. He reports that he does not drink alcohol or use drugs.  Allergies: Allergies  Allergen Reactions  . Benazepril Cough  . Doxycycline Other (See Comments)    Unknown  . Levofloxacin Hives and Swelling  . Simvastatin Other (See Comments)    Pt does not remember what the reaction was. ?muscle aches  . Tamsulosin Hives and Swelling  . Tramadol Hcl Hives and Swelling  . Sulfa Antibiotics Hives, Swelling, Rash and Other (See Comments)    Severe rash    Family History: Family History  Problem Relation Age of Onset  . Stroke Sister     Prior to Admission medications   Medication Sig Start Date End Date Taking? Authorizing Provider  atorvastatin (LIPITOR) 40 MG tablet Take 40 mg by mouth every evening.   Yes [provider]  Carbidopa-Levodopa ER (RYTARY) 36.25-145 MG CPCR Take 3 capsules by mouth 4 (four) times daily.    Yes [provider]  clopidogrel (PLAVIX) 75 MG tablet Take 75 mg by mouth daily.  05/22/16  Yes [provider]  furosemide (LASIX) 20 MG tablet Take 1 tablet (20 mg total) by mouth as needed. Patient taking differently: Take 20 mg by mouth daily.  08/20/18 11/18/18 Yes BranchAlphonse Guild, MD  HYDROcodone-acetaminophen (NORCO/VICODIN) 5-325 MG tablet Take 1-2 tablets by mouth every 4 (four) hours as needed for moderate pain. 09/11/18  Yes McKenzie, Candee Furbish, MD  meclizine (ANTIVERT) 25 MG tablet Take 25 mg by mouth 3 (three) times daily as needed for dizziness.    Yes [provider]  metoprolol succinate (TOPROL-XL) 100 MG 24 hr tablet Take 100 mg by mouth every evening.  06/22/16  Yes [provider]  NUEDEXTA 20-10 MG CAPS Take 1 capsule by mouth 2 (two) times daily. 06/02/18  Yes [provider]  hyoscyamine (LEVSIN SL) 0.125 MG SL tablet Take 1 tablet (0.125 mg total) by mouth every 4 (four) hours as needed for cramping. Patient not taking: Reported on 09/02/2018 06/18/17   Cleon Gustin, MD  mirabegron ER (MYRBETRIQ) 25 MG TB24 tablet Take 1 tablet (25 mg total) by mouth daily. Patient not taking: Reported on 10/11/2018 06/18/17   Cleon Gustin, MD    Review of Systems:  Constitutional: Patient states that he has had nights sweats and chills in the past few days. Denies appetite changes. HEENT: Denies photophobia, eye pain, redness, hearing loss, ear pain, congestion, sore throat, rhinorrhea, sneezing, mouth sores, trouble swallowing, neck pain, neck stiffness and tinnitus.   Respiratory: States he has SOB on exertion. Denies cough, and wheezing.   Cardiovascular: he expressed chest discomfort. Denies palpitations and leg swelling.  Gastrointestinal: States he has mid lower abdominal discomfort. Denies nausea, vomiting, diarrhea, constipation, blood in stool and abdominal distention.  Genitourinary: States he has had hematuria for the past 4 days. Denies dysuria,  urgency, frequency, flank pain and difficulty urinating.  Endocrine: Denies: hot or cold intolerance, changes in hair or nails, polyuria, polydipsia. Musculoskeletal: Patient states he has gait problems since he had a CVA in 2016. Denies myalgias, back pain, joint swelling, arthralgias Skin: Denies pallor, rash and wound.  Neurological: States he has general weakness. Denies dizziness, seizures, syncope, light-headedness, numbness and headaches.  Hematological: Denies adenopathy. Easy bruising, personal or family bleeding history  Psychiatric/Behavioral: Denies suicidal ideation, mood changes, confusion, nervousness, sleep disturbance and agitation.  Physical Exam: Vitals:   10/11/18 1530 10/11/18 1615 10/11/18 1630 10/11/18 1634  BP: 124/79     Pulse: (!) 48 (!) 51 (!) 48 (!) 51  Resp: 13 17 14 17   Temp:      TempSrc:      SpO2: 100% 100% 100% 100%  Weight:      Height:       Constitutional: NAD, calm, comfortable, lying in bed and answering questions appropriately. Patient is afebrile and currently denies CP. Eyes: PERRLA, lids and conjunctivae normal, no icterus ENMT: Mucous membranes are moist. Posterior pharynx clear of any exudate or lesions.  Neck: normal, supple, no masses, no thyromegaly Respiratory: Scattered rhonchi. Good air movement. No wheezing, no crackles. Normal respiratory effort. No accessory muscle use.  Cardiovascular: Regular rate and rhythm, no murmurs / rubs / gallops. No extremity edema. 2+ pedal pulses. No carotid bruits.  Abdomen: vague mid lower section tenderness, no masses palpated. No hepatosplenomegaly. Bowel sounds positive.  Musculoskeletal: no clubbing / cyanosis. No joint deformity upper and lower extremities. Good ROM, no contractures. Normal muscle tone.  Skin: no rashes, lesions, ulcers. No induration Neurologic: CN 2-12 grossly intact. Sensation intact, DTR normal. Strength 4/5 in all 4 due to poor effort. Appreciated residual dysarthria.    Psychiatric: Normal judgment and insight. Alert and oriented x 3. Normal mood.    Labs on Admission: I have personally reviewed the following labs and imaging studies  CBC: Recent Labs  Lab 10/11/18 0813  WBC 6.2  NEUTROABS 3.5  HGB 13.6  HCT 44.6  MCV 82.9  PLT 850   Basic Metabolic Panel: Recent Labs  Lab 10/11/18 0813  NA 139  K 4.1  CL 104  CO2 28  GLUCOSE 110*  BUN 13  CREATININE 1.11  CALCIUM 9.2   GFR: Estimated Creatinine Clearance: 52.9 mL/min (by C-G formula based on SCr of 1.11 mg/dL).   Liver Function Tests: Recent Labs  Lab 10/11/18 0813  AST 13*  ALT 6  ALKPHOS 63  BILITOT 0.9  PROT 6.9  ALBUMIN 3.8   Recent Labs  Lab 10/11/18 0813  LIPASE 23   Coagulation Profile: Recent Labs  Lab 10/11/18 0813  INR 1.16   Cardiac Enzymes: Recent Labs  Lab 10/11/18 0813 10/11/18 1019 10/11/18 1436  TROPONINI <0.03 <0.03 <0.03   Urine analysis:    Component Value Date/Time   COLORURINE BROWN (A) 10/11/2018 1000   APPEARANCEUR TURBID (A) 10/11/2018 1000   LABSPEC 1.025 10/11/2018 1000   PHURINE 5.5 10/11/2018 1000   GLUCOSEU NEGATIVE 10/11/2018 1000   HGBUR LARGE (A) 10/11/2018 1000   BILIRUBINUR SMALL (A) 10/11/2018 1000   KETONESUR TRACE (A) 10/11/2018 1000   PROTEINUR 100 (A) 10/11/2018 1000   NITRITE POSITIVE (A) 10/11/2018 1000   LEUKOCYTESUR TRACE (A) 10/11/2018 1000    Radiological Exams on Admission: Ct Abdomen Pelvis W Contrast  Result Date: 10/11/2018 CLINICAL DATA:  Status post TURP 09/11/2018. Hematuria for the past 3 days with pelvic and flank pain. EXAM: CT ABDOMEN AND PELVIS WITH CONTRAST TECHNIQUE: Multidetector CT imaging of the abdomen and pelvis was performed using the standard protocol following bolus administration of intravenous contrast. CONTRAST:  183mL ISOVUE-300 IOPAMIDOL (ISOVUE-300) INJECTION 61%, 65mL ISOVUE-300 IOPAMIDOL (ISOVUE-300) INJECTION 61% COMPARISON:  None. FINDINGS: Lower chest: Bibasilar  atelectasis. Mild cardiomegaly without pericardial effusion. No pleural effusion. Postop changes of the GE junction. No significant hiatal hernia. Symmetric gynecomastia present. Hepatobiliary: No focal liver abnormality is seen. No gallstones, gallbladder wall thickening, or biliary dilatation. Pancreas: Unremarkable. No pancreatic ductal dilatation or surrounding inflammatory changes. Spleen: Normal in size without focal abnormality. Adrenals/Urinary Tract: Normal adrenal glands. Small left renal hypodense cortical cyst measures 12 mm. No renal obstruction or hydronephrosis. Ureters are symmetric and decompressed. Bladder demonstrates mild wall thickening. Stomach/Bowel: Negative for bowel obstruction, significant dilatation, ileus, or free air. No fluid collection or abscess. Negative for hemorrhage or hematoma. Appendix not visualized. Vascular/Lymphatic: Intact aorta. Negative for aneurysm or dissection. No retroperitoneal hemorrhage or hematoma. Mesenteric and renal vasculature remain patent. Iliac tortuosity with minor atherosclerosis. No iliac occlusion or inflow disease. No adenopathy. Reproductive: Prostate gland is markedly enlarged with heterogeneous enhancement. Postop changes from TURP in the midline of the prostate. No prostate fluid collection. No pelvic abscess or hematoma. Other: Small fat containing left inguinal hernia. Intact abdominal wall. Musculoskeletal: Degenerative changes of the spine. Mild scoliosis. No acute osseous finding. IMPRESSION: Postop changes from TURP of the prostate. Prostate gland remains markedly enlarged with heterogeneous enhancement. But no prostatic fluid collection, abscess, or other intrapelvic abnormality by CT. No other acute intra-abdominal or pelvic finding. Minor iliac atherosclerosis Fat containing left inguinal hernia Electronically Signed   By: Jerilynn Mages.  Shick M.D.   On: 10/11/2018 12:26   Dg Chest Port 1 View  Result Date: 10/11/2018 CLINICAL DATA:  Chest pain  EXAM: PORTABLE CHEST 1 VIEW COMPARISON:  None. FINDINGS: There is airspace opacity in the left lower lobe with small left pleural effusion. The lungs elsewhere are clear. Heart is mildly enlarged with pulmonary vascularity normal. No adenopathy. There is a loop recorder on the left. No bone lesions. IMPRESSION: Airspace consolidation consistent with pneumonia left lower lobe with small left pleural effusion. Right lung clear. Heart mildly enlarged. Electronically Signed   By: Lowella Grip III M.D.   On: 10/11/2018 09:33    EKG: Independently reviewed. Sinus bradycardia, normal axis.  Assessment/Plan 1-Hematuria -Patient has had hematuria since Tuesday (10/08/18) without dysuria -UA showed many bacteria, positive nitrite, RBC's and WBC's -urine cx collected -patient with recent instrumentation -started on Rocephin -IVF's given -will follow response. -urology consulted and not further recommendations given at this time. -will avoid heparin, NSAID's  and hold plavix -SCD"s for DVT prophylaxis.  2-Chest pain/SOB -atypical, but with risk factors (heart score 4) -so far troponin neg -will complete cycling troponin and EKG -check lipid panel -holding ASA and Plavix due to hematuria -PRN NTG and morphine for pain -discomfort most likely due to PNA. -continue statins and metoprolol (holding parameters in place due to bradycardia).  3-left lower lobe PNA -Community-acquired -Started on Pulmicort and will use DuoNeb as needed -Oxygen supplementation as needed -Continue Rocephin and Zithromax -Follow-up results of strep antigen in urine and Legionella antigen. -Sputum culture ordered (patient currently no coughing).  4-hypertension -Blood pressure stable -Continue Lasix and metoprolol (holding parameters in the setting of bradycardia in place) -Heart healthy diet ordered.  5-hyperlipidemia -Continue Lipitor  6-history of Parkinson's disease -Appears to be stable -Continue  outpatient follow-up with neurology -Continue carbidopa-Levodopa  7-history of stroke: Patient was receiving dysarthria -No new focal deficits reported -Holding Plavix in the setting of acute ongoing hematuria -Continue statin -Continue blood pressure control.  DVT prophylaxis: SCD's Code Status: Full Family Communication: Patient's wife and daughter at bedside Disposition Plan: Anticipate discharge back home in the next 24 to 48 hours once ACS is ruled out and stability from hematuria proven. Consults called: Urology and cardiology were consulted by EDP. Admission status: Observation, LOS less than 2 midnights, telemetry paced.   Time Spent: 65 minutes.  Barton Dubois MD [facility] Pager [phone]  If 7PM-7AM, please contact night-coverage [email or web address] Password [facility]  10/11/2018, 5:26 PM

## 2018-10-11 NOTE — ED Provider Notes (Signed)
[facility] EMERGENCY DEPARTMENT Provider Note   CSN: [id number] Arrival date & time: 10/11/18  5732     History   Chief Complaint Chief Complaint  Patient presents with  . Hematuria    HPI Mike Nixon is a 77 y.o. male.  The history is provided by the patient, the spouse and a relative. The history is limited by the condition of the patient (Speech impairment post stroke).  Hematuria   Pt was seen at 0750. Per pt's family and pt: Pt having blood in his urine x3 days and "getting worse." Family states pt is "passing clots" when he urinates. Pt has had left flank pain since yesterday. Endorses voiding regularly/normally. Hx TURP 09/11/2018 ([facility]).  Denies N/V/D, no abd pain, no CP/SOB, no fevers, no rash, no dysuria, no testicular pain/swelling.    Past Medical History:  Diagnosis Date  . Anticoagulant long-term use    PLAVIX  . Benign localized prostatic hyperplasia with lower urinary tract symptoms (LUTS)   . Bladder tumor   . Borderline diabetes    watches diet  . ED (erectile dysfunction) of organic origin   . Gait disturbance, post-stroke    uses wheelchair long distance  . Glaucoma, right eye   . History of bleeding peptic ulcer    03/ 1979  s/p  surgery  . History of concussion    1987 no loc-- no residual  . History of CVA with residual deficit    05/ 2016 CYPTOGENIC CVA --- RESIDUAL GAIT DISTURBANCE AND SIALORRHEA ACCESS (DROOLING)  . Hyperlipidemia   . Hypertension   . Mild atherosclerosis of both carotid arteries    per duplex from outside facitlity 04-08-2015 less then 50% stenosis bilateral ICA  . Parkinson's disease [facility]) neurologist -- dr r. Mitzi Hansen falconer ([facility], [place] [facility])   dx 2015  . PBA (pseudobulbar affect)    laughter-- residual from cva 05/ 2016  . PFO (patent foramen ovale)    per TEE 08-18-2016  . Speech impairment    post-stroke  . Status post placement of implantable loop recorder 08/18/2016     placed by dr allred  . Wears glasses     Patient Active Problem List   Diagnosis Date Noted  . Bladder tumor 05/07/2017    Past Surgical History:  Procedure Laterality Date  . APPENDECTOMY  1978  . CATARACT EXTRACTION W/ INTRAOCULAR LENS IMPLANT Right 2014 approx.  . CYSTOSCOPY W/ URETERAL STENT PLACEMENT Bilateral 05/07/2017   Procedure: CYSTOSCOPY WITH RETROGRADE PYELOGRAM/URETERAL;  Surgeon: Cleon Gustin, MD;  Location: [facility];  Service: Urology;  Laterality: Bilateral;  . EP IMPLANTABLE DEVICE N/A 08/18/2016   Procedure: Loop Recorder Insertion;  Surgeon: Thompson Grayer, MD;  Location: [facility];  Service: Cardiovascular;  Laterality: N/A;  . REMOVAL NODULES BILATERAL FLANK  2008 approx.   per pt benign fatty tissue  . REPAIR OF PERFORATED ULCER  01/1978   bleeding peptic ulcer  . TEE WITHOUT CARDIOVERSION N/A 08/18/2016   Procedure: TRANSESOPHAGEAL ECHOCARDIOGRAM (TEE);  Surgeon: Sueanne Margarita, MD;  Location: [facility] ENDOSCOPY;  Service: Cardiovascular;  Laterality: N/A; no evidence thrombus;  mobile atrial septum w/ positive bubble study consistent with PFO/ trivial AR, mild TR  . TRANSTHORACIC ECHOCARDIOGRAM  04/09/2015   ef 20-25%, grade 1 diastolic dysfunction/  mild AV sclerosis without stenosis/  mild TR,  trivial MR and PR  . TRANSURETHRAL RESECTION OF BLADDER TUMOR N/A 05/07/2017   Procedure: TRANSURETHRAL RESECTION OF BLADDER  TUMOR (TURBT);  Surgeon: Cleon Gustin, MD;  Location: [facility];  Service: Urology;  Laterality: N/A;  . TRANSURETHRAL RESECTION OF BLADDER TUMOR N/A 06/18/2017   Procedure: TRANSURETHRAL RESECTION OF BLADDER TUMOR (TURBT);  Surgeon: Cleon Gustin, MD;  Location: [facility];  Service: Urology;  Laterality: N/A;  . TRANSURETHRAL RESECTION OF BLADDER TUMOR N/A 09/11/2018   Procedure: TRANSURETHRAL RESECTION OF BLADDER TUMOR (TURBT);  Surgeon: Cleon Gustin, MD;  Location: [facility];  Service:  Urology;  Laterality: N/A;  . TRANSURETHRAL RESECTION OF PROSTATE  08-20-2013   at [facility] Medications    Prior to Admission medications   Medication Sig Start Date End Date Taking? Authorizing Provider  atorvastatin (LIPITOR) 40 MG tablet Take 40 mg by mouth every evening.    [provider]  Carbidopa-Levodopa ER (RYTARY) 36.25-145 MG CPCR Take 3 capsules by mouth 4 (four) times daily.     [provider]  clopidogrel (PLAVIX) 75 MG tablet Take 75 mg by mouth daily.  05/22/16   [provider]  furosemide (LASIX) 20 MG tablet Take 1 tablet (20 mg total) by mouth as needed. Patient taking differently: Take 20 mg by mouth daily.  08/20/18 11/18/18  Arnoldo Lenis, MD  HYDROcodone-acetaminophen (NORCO/VICODIN) 5-325 MG tablet Take 1-2 tablets by mouth every 4 (four) hours as needed for moderate pain. 09/11/18   McKenzie, Candee Furbish, MD  hyoscyamine (LEVSIN SL) 0.125 MG SL tablet Take 1 tablet (0.125 mg total) by mouth every 4 (four) hours as needed for cramping. Patient not taking: Reported on 09/02/2018 06/18/17   Cleon Gustin, MD  meclizine (ANTIVERT) 25 MG tablet Take 25 mg by mouth 3 (three) times daily as needed for dizziness.     [provider]  metoprolol succinate (TOPROL-XL) 100 MG 24 hr tablet Take 100 mg by mouth every evening.  06/22/16   [provider]  mirabegron ER (MYRBETRIQ) 25 MG TB24 tablet Take 1 tablet (25 mg total) by mouth daily. Patient not taking: Reported on 09/02/2018 06/18/17   Cleon Gustin, MD  NUEDEXTA 20-10 MG CAPS Take 1 capsule by mouth 2 (two) times daily. 06/02/18   [provider]    Family History Family History  Problem Relation Age of Onset  . Stroke Sister     Social History Social History   Tobacco Use  . Smoking status: Never Smoker  . Smokeless tobacco: Never Used  Substance Use Topics  . Alcohol use: No  . Drug use: No     Allergies   Benazepril;  Doxycycline; Levofloxacin; Simvastatin; Tamsulosin; Tramadol hcl; and Sulfa antibiotics   Review of Systems Review of Systems  Unable to perform ROS: Patient nonverbal  Genitourinary: Positive for hematuria.     Physical Exam Updated Vital Signs BP (!) 166/97   Pulse (!) 52   Temp 98 F (36.7 C) (Oral)   Resp 18   Ht 5\' 7"  (1.702 m)   Wt 74.8 kg   SpO2 99%   BMI 25.84 kg/m   Physical Exam 0755: Physical examination:  Nursing notes reviewed; Vital signs and O2 SAT reviewed;  Constitutional: Well developed, Well nourished, Well hydrated, In no acute distress; Head:  Normocephalic, atraumatic; Eyes: EOMI, PERRL, No scleral icterus; ENMT: Mouth and pharynx normal, Mucous membranes moist; Neck: Supple, Full range of motion, No lymphadenopathy; Cardiovascular: Regular rate and rhythm, No gallop; Respiratory: Breath sounds clear & equal bilaterally, No wheezes. Normal  respiratory effort/excursion; Chest: Nontender, Movement normal; Abdomen: Soft, Nontender, Nondistended, Normal bowel sounds; Genitourinary: +left CVA tenderness; Spine:  No midline CS, TS, LS tenderness. +TTP left lumbar paraspinal muscles.;;  Extremities: Peripheral pulses normal, No tenderness, No edema, No calf edema or asymmetry.; Neuro: Awake, alert. No facial droop. Speech minimal, and at baseline per family. Pt moves all extremities on stretcher..; Skin: Color normal, Warm, Dry.   ED Treatments / Results  Labs (all labs ordered are listed, but only abnormal results are displayed)   EKG EKG Interpretation  Date/Time:  Friday October 11 2018 09:03:23 EST Ventricular Rate:  50 PR Interval:    QRS Duration: 86 QT Interval:  472 QTC Calculation: 431 R Axis:   12 Text Interpretation:  Sinus rhythm Borderline T wave abnormalities Minimal ST elevation, inferior leads When compared with ECG of 08/18/2016 No significant change was found Confirmed by Francine Graven [phone]) on 10/11/2018 9:06:56 AM   ED ECG  REPORT   Date: 10/11/2018  #2  Rate: 54  Rhythm: normal sinus rhythm  QRS Axis: normal  Intervals: normal  ST/T Wave abnormalities: nonspecific ST/T changes  Conduction Disutrbances:none  Narrative Interpretation:   Old EKG Reviewed: unchanged; no significant changes from previous tracing today. I have personally reviewed the EKG tracing and agree with the computerized printout as noted.     Radiology   Procedures Procedures (including critical care time)  Medications Ordered in ED Medications  morphine 4 MG/ML injection 4 mg (4 mg Intravenous Given 10/11/18 0812)  ondansetron (ZOFRAN) injection 4 mg (4 mg Intravenous Given 10/11/18 7616)     Initial Impression / Assessment and Plan / ED Course  I have reviewed the triage vital signs and the nursing notes.  Pertinent labs & imaging results that were available during my care of the patient were reviewed by me and considered in my medical decision making (see chart for details).  MDM Reviewed: previous chart, nursing note and vitals Reviewed previous: labs and ECG Interpretation: labs, ECG, x-ray and CT scan   Results for orders placed or performed during the hospital encounter of 10/11/18  Urinalysis, Routine w reflex microscopic  Result Value Ref Range   Color, Urine BROWN (A) YELLOW   APPearance TURBID (A) CLEAR   Specific Gravity, Urine 1.025 1.005 - 1.030   pH 5.5 5.0 - 8.0   Glucose, UA NEGATIVE NEGATIVE mg/dL   Hgb urine dipstick LARGE (A) NEGATIVE   Bilirubin Urine SMALL (A) NEGATIVE   Ketones, ur TRACE (A) NEGATIVE mg/dL   Protein, ur 100 (A) NEGATIVE mg/dL   Nitrite POSITIVE (A) NEGATIVE   Leukocytes, UA TRACE (A) NEGATIVE  Comprehensive metabolic panel  Result Value Ref Range   Sodium 139 135 - 145 mmol/L   Potassium 4.1 3.5 - 5.1 mmol/L   Chloride 104 98 - 111 mmol/L   CO2 28 22 - 32 mmol/L   Glucose, Bld 110 (H) 70 - 99 mg/dL   BUN 13 8 - 23 mg/dL   Creatinine, Ser 1.11 0.61 - 1.24 mg/dL    Calcium 9.2 8.9 - 10.3 mg/dL   Total Protein 6.9 6.5 - 8.1 g/dL   Albumin 3.8 3.5 - 5.0 g/dL   AST 13 (L) 15 - 41 U/L   ALT 6 0 - 44 U/L   Alkaline Phosphatase 63 38 - 126 U/L   Total Bilirubin 0.9 0.3 - 1.2 mg/dL   GFR calc non Af Amer >60 >60 mL/min   GFR calc Af Amer >60 >60 mL/min  Anion gap 7 5 - 15  Lipase, blood  Result Value Ref Range   Lipase 23 11 - 51 U/L  Lactic acid, plasma  Result Value Ref Range   Lactic Acid, Venous 1.5 0.5 - 1.9 mmol/L  Lactic acid, plasma  Result Value Ref Range   Lactic Acid, Venous 2.5 (HH) 0.5 - 1.9 mmol/L  CBC with Differential  Result Value Ref Range   WBC 6.2 4.0 - 10.5 K/uL   RBC 5.38 4.22 - 5.81 MIL/uL   Hemoglobin 13.6 13.0 - 17.0 g/dL   HCT 44.6 39.0 - 52.0 %   MCV 82.9 80.0 - 100.0 fL   MCH 25.3 (L) 26.0 - 34.0 pg   MCHC 30.5 30.0 - 36.0 g/dL   RDW 13.7 11.5 - 15.5 %   Platelets 195 150 - 400 K/uL   nRBC 0.0 0.0 - 0.2 %   Neutrophils Relative % 57 %   Neutro Abs 3.5 1.7 - 7.7 K/uL   Lymphocytes Relative 30 %   Lymphs Abs 1.9 0.7 - 4.0 K/uL   Monocytes Relative 8 %   Monocytes Absolute 0.5 0.1 - 1.0 K/uL   Eosinophils Relative 4 %   Eosinophils Absolute 0.2 0.0 - 0.5 K/uL   Basophils Relative 1 %   Basophils Absolute 0.0 0.0 - 0.1 K/uL   Immature Granulocytes 0 %   Abs Immature Granulocytes 0.02 0.00 - 0.07 K/uL  Protime-INR  Result Value Ref Range   Prothrombin Time 14.7 11.4 - 15.2 seconds   INR 1.16   Troponin I - Once  Result Value Ref Range   Troponin I <0.03 <0.03 ng/mL  Troponin I - Once  Result Value Ref Range   Troponin I <0.03 <0.03 ng/mL  Urinalysis, Microscopic (reflex)  Result Value Ref Range   RBC / HPF >50 0 - 5 RBC/hpf   WBC, UA 0-5 0 - 5 WBC/hpf   Bacteria, UA MANY (A) NONE SEEN   Squamous Epithelial / LPF NONE SEEN 0 - 5   Ct Abdomen Pelvis W Contrast Result Date: 10/11/2018 CLINICAL DATA:  Status post TURP 09/11/2018. Hematuria for the past 3 days with pelvic and flank pain. EXAM: CT ABDOMEN  AND PELVIS WITH CONTRAST TECHNIQUE: Multidetector CT imaging of the abdomen and pelvis was performed using the standard protocol following bolus administration of intravenous contrast. CONTRAST:  171mL ISOVUE-300 IOPAMIDOL (ISOVUE-300) INJECTION 61%, 33mL ISOVUE-300 IOPAMIDOL (ISOVUE-300) INJECTION 61% COMPARISON:  None. FINDINGS: Lower chest: Bibasilar atelectasis. Mild cardiomegaly without pericardial effusion. No pleural effusion. Postop changes of the GE junction. No significant hiatal hernia. Symmetric gynecomastia present. Hepatobiliary: No focal liver abnormality is seen. No gallstones, gallbladder wall thickening, or biliary dilatation. Pancreas: Unremarkable. No pancreatic ductal dilatation or surrounding inflammatory changes. Spleen: Normal in size without focal abnormality. Adrenals/Urinary Tract: Normal adrenal glands. Small left renal hypodense cortical cyst measures 12 mm. No renal obstruction or hydronephrosis. Ureters are symmetric and decompressed. Bladder demonstrates mild wall thickening. Stomach/Bowel: Negative for bowel obstruction, significant dilatation, ileus, or free air. No fluid collection or abscess. Negative for hemorrhage or hematoma. Appendix not visualized. Vascular/Lymphatic: Intact aorta. Negative for aneurysm or dissection. No retroperitoneal hemorrhage or hematoma. Mesenteric and renal vasculature remain patent. Iliac tortuosity with minor atherosclerosis. No iliac occlusion or inflow disease. No adenopathy. Reproductive: Prostate gland is markedly enlarged with heterogeneous enhancement. Postop changes from TURP in the midline of the prostate. No prostate fluid collection. No pelvic abscess or hematoma. Other: Small fat containing left inguinal hernia. Intact abdominal wall.  Musculoskeletal: Degenerative changes of the spine. Mild scoliosis. No acute osseous finding. IMPRESSION: Postop changes from TURP of the prostate. Prostate gland remains markedly enlarged with heterogeneous  enhancement. But no prostatic fluid collection, abscess, or other intrapelvic abnormality by CT. No other acute intra-abdominal or pelvic finding. Minor iliac atherosclerosis Fat containing left inguinal hernia Electronically Signed   By: Jerilynn Mages.  Shick M.D.   On: 10/11/2018 12:26   Dg Chest Port 1 View Result Date: 10/11/2018 CLINICAL DATA:  Chest pain EXAM: PORTABLE CHEST 1 VIEW COMPARISON:  None. FINDINGS: There is airspace opacity in the left lower lobe with small left pleural effusion. The lungs elsewhere are clear. Heart is mildly enlarged with pulmonary vascularity normal. No adenopathy. There is a loop recorder on the left. No bone lesions. IMPRESSION: Airspace consolidation consistent with pneumonia left lower lobe with small left pleural effusion. Right lung clear. Heart mildly enlarged. Electronically Signed   By: Lowella Grip III M.D.   On: 10/11/2018 09:33     0900:  Pt c/o chest pain: "bricks on his chest." Pt did not take his plavix yesterday d/t hematuria symptom. EKG appears unchanged from previous in MUSE. Will dose ASA, SL ntg and continue to monitor.   1045:  2 troponin's negative, 2 EKG's without acute changes. CP resolved after ASA and SL ntg.  1300  IV abx started for CAP and UTI; UC is pending. Pt remains afebrile. Judicious IVF given for mildly elevated lactic acid. T/C returned from [facility] Dr. Karsten Ro, case discussed, including:  HPI, pertinent PM/SHx, VS/PE, dx testing, ED course and treatment:  Agrees with ED treatment, no acute intervention needed at this time. pt can stay at [facility].   1325:  T/C returned from [facility] Dr. Bronson Ing, case discussed, including:  HPI, pertinent PM/SHx, VS/PE, dx testing, ED course and treatment:  Agrees with ED treatment, no acute intervention needed at this time, pt can stay at [facility] to cycle troponin.   1340:  T/C returned from [facility] Dr. Dyann Kief, case discussed, including:  HPI, pertinent PM/SHx, VS/PE, dx testing, ED course and treatment:   Agreeable to admit.     Final Clinical Impressions(s) / ED Diagnoses   Final diagnoses:  None    ED Discharge Orders    None       Francine Graven, DO 10/14/18 2211

## 2018-10-11 NOTE — ED Notes (Signed)
CRITICAL VALUE ALERT  Critical Value:  Lactic Acid 2.5  Date & Time Notied:  10/11/18 1058  Provider Notified: Dr. Thurnell Garbe  Orders Received/Actions taken: EDP notified, no further orders given

## 2018-10-11 NOTE — ED Notes (Signed)
Pt returned from CT °

## 2018-10-11 NOTE — ED Triage Notes (Signed)
PT states he had a TURP procedure done on 09/11/18. PT states having blood back in his urine x3 days ago but the bleeding has increased and pain has developed to his left flank area since yesterday.

## 2018-10-11 NOTE — Progress Notes (Signed)
Has denied chest pain since admission to room.  Able to stand with assist and walker.  Has some right sided weakness and slurred speech from history of stroke but is alert and oriented x 4.  Wife at bedside and able to help answer questions.

## 2018-10-12 DIAGNOSIS — I639 Cerebral infarction, unspecified: Secondary | ICD-10-CM | POA: Diagnosis not present

## 2018-10-12 DIAGNOSIS — J181 Lobar pneumonia, unspecified organism: Secondary | ICD-10-CM | POA: Diagnosis not present

## 2018-10-12 DIAGNOSIS — R319 Hematuria, unspecified: Secondary | ICD-10-CM | POA: Diagnosis not present

## 2018-10-12 DIAGNOSIS — E785 Hyperlipidemia, unspecified: Secondary | ICD-10-CM | POA: Diagnosis not present

## 2018-10-12 DIAGNOSIS — R109 Unspecified abdominal pain: Secondary | ICD-10-CM | POA: Diagnosis not present

## 2018-10-12 DIAGNOSIS — G20 Parkinson's disease: Secondary | ICD-10-CM | POA: Diagnosis not present

## 2018-10-12 DIAGNOSIS — R0789 Other chest pain: Secondary | ICD-10-CM | POA: Diagnosis not present

## 2018-10-12 DIAGNOSIS — R079 Chest pain, unspecified: Secondary | ICD-10-CM | POA: Diagnosis not present

## 2018-10-12 DIAGNOSIS — I10 Essential (primary) hypertension: Secondary | ICD-10-CM | POA: Diagnosis not present

## 2018-10-12 LAB — CBC
HCT: 42.8 % (ref 39.0–52.0)
Hemoglobin: 12.6 g/dL — ABNORMAL LOW (ref 13.0–17.0)
MCH: 24.9 pg — ABNORMAL LOW (ref 26.0–34.0)
MCHC: 29.4 g/dL — ABNORMAL LOW (ref 30.0–36.0)
MCV: 84.6 fL (ref 80.0–100.0)
Platelets: 184 10*3/uL (ref 150–400)
RBC: 5.06 MIL/uL (ref 4.22–5.81)
RDW: 14 % (ref 11.5–15.5)
WBC: 6.1 10*3/uL (ref 4.0–10.5)
nRBC: 0 % (ref 0.0–0.2)

## 2018-10-12 LAB — LIPID PANEL
Cholesterol: 99 mg/dL (ref 0–200)
HDL: 39 mg/dL — ABNORMAL LOW (ref >40)
LDL Cholesterol: 46 mg/dL (ref 0–99)
Total CHOL/HDL Ratio: 2.5 RATIO
Triglycerides: 71 mg/dL (ref <150)
VLDL: 14 mg/dL (ref 0–40)

## 2018-10-12 LAB — URINE CULTURE: Culture: NO GROWTH

## 2018-10-12 LAB — TROPONIN I
Troponin I: <0.03 ng/mL (ref <0.03)
Troponin I: <0.03 ng/mL (ref <0.03)

## 2018-10-12 LAB — STREP PNEUMONIAE URINARY ANTIGEN: Strep Pneumo Urinary Antigen: NEGATIVE

## 2018-10-12 MED ORDER — AMOXICILLIN-POT CLAVULANATE 875-125 MG PO TABS: 1.0000 | ORAL_TABLET | Freq: Two times a day (BID) | ORAL | 0 refills | Status: AC

## 2018-10-12 MED ORDER — CLOPIDOGREL BISULFATE 75 MG PO TABS: 75.0000 mg | ORAL_TABLET | Freq: Every day | ORAL | Status: DC

## 2018-10-12 MED ORDER — METOPROLOL SUCCINATE ER 50 MG PO TB24: 50.0000 mg | ORAL_TABLET | Freq: Every evening | ORAL | 1 refills | Status: DC

## 2018-10-12 NOTE — Progress Notes (Signed)
Has had left flank pain today and received norco at 1320.  Hematuria continues and to follow up with his urologist.  IV removed and discharge instructions reviewed with patient and wife.  Printed scripts given and understands to hold plavix until visit with urologist. Wife to drive home

## 2018-10-12 NOTE — Discharge Summary (Signed)
Physician Discharge Summary  Shimshon Narula NOM:[id number] DOB: 1940-12-07 DOA: 10/11/2018  PCP: [name], MD  Admit date: 10/11/2018 Discharge date: 10/12/2018  Time spent: 35 minutes  Recommendations for Outpatient Follow-up:  Repeat CBC to follow hemoglobin trend Repeat chest x-ray in 6 weeks to assure complete resolution of infiltrates. Repeat BX metabolic panel to follow electrolytes and renal function.  Discharge Diagnoses:  Principal Problem:   Chest pain in adult Active Problems:   Hematuria   Community acquired pneumonia   CVA (cerebral vascular accident) ([place])   Parkinson's disease ([place])   Benign essential HTN   HLD (hyperlipidemia)   Discharge Condition: Stable and improved.  Patient discharged home with instructions to follow-up with PCP and urologist as an outpatient.  Diet recommendation: Heart healthy diet.  Filed Weights   10/11/18 0741  Weight: 74.8 kg    History of present illness:  77 y.o. male with a PMH of high grade bladder cancer, S/P TURBT (done x 3, latest on 09/11/2018), hypertension, hyperlipidemia, Parkinson's disease, CVA (2016), who came to the ED complaining of painless hematuria that had been going on since Tuesday (10/08/18). The patient also states that he has had some mid lower abdominal discomfort, and some general weakness for the past few days as well. While in the ED, the patient began having some chest discomfort, which he describes as 4/10 in intensity, stabbing, constant, non radiating, with no alleviating or aggravating factors. He also states that he has been short of breath on exertion, and had some night sweats and chills for the past few days. Work up in the ED involved a chest x ray and demonstrated airspace consolidation consistent with pneumonia in the left lower lobe with small left pleural effusion.  Patient denies headache, neck pain, difficulty swallowing, palpitations, constipation, dysuria, hematochezia, cough, melena.    T- 98 F, RR- 17, HR- 51, BP- 124/79, Sat O2 100% on 1-2L [place]; Lactic acid at 2.0, troponin's negative x 2, 2 EKG's without acute ischemic changes.  In the ED the patient was given a bolus of 0.9% NS infusion, X1 morphine/NTG and was started on Rocephin and Zithromax. UA showed Many bacteria. A urine culture was obtained as well.  CT scan of abd and pelvis showed no prostatic fluid collection, abscess, or other intrapelvic abnormality. No other acute intra-abdominal or pelvic finding.   Hospital Course:  1-hematuria -Painless and most likely associated with recent TURBT -Given concerns for active ongoing infection based on urine analysis patient has been started on antibiotics -He denies dysuria, is afebrile and has normal WBCs -CT scan of the abdomen and pelvis no demonstrating any prostatic fluid collection, abscess or other intrapelvic abnormality. -Despite ongoing signs of hematuria hemoglobin has remained stable and at discharge was 12.6 -EDP discussed with urology who recommended Trelstar antibiotic therapy and to follow-up as an outpatient for cystoscopy if no life-threatening hematuria present. -Will continue holding Plavix and patient has been instructed to avoid NSAIDs.  2-chest pain/shortness of breath -Appears to be associated with left lower lobe pneumonia -No acute changes on EKG or telemetry to suggest ischemic process -Troponin negative x4 -At discharge patient reported just mild discomfort with deep breath on the left side (matching anatomically location for lungs infiltrate). -Continue beta-blocker and statins.  3-left lower lobe pneumonia -Community-acquired -No fever, normal WBCs and no requiring oxygen supplementation. -Patient treated for 48 hours with Rocephin and Zithromax in the time of discharge had prescription to continue Augmentin for antibiotic therapy completion. -Repeat chest x-ray  in 6 weeks to assure resolution of infiltrates.  4-hypertension -Blood  pressure has remained stable -Resume home antihypertensive regimen -Heart healthy diet has been encouraged.  5-hyperlipidemia -Continue Lipitor.  6-history of Parkinson's disease -Appears to be stable and at baseline -Continue outpatient follow-up with neurology -Continue carbidopa-levodopa.  7-prior history of stroke: Patient with residual dysarthria -No new focal deficits appreciated -Continue statins and continue blood pressure control -Continue holding Plavix until follow-up with urology service due to ongoing hematuria.  Procedures:  See below for x-ray reports.  Consultations:  None; But urology and cardiology were consulted by EDP (see emergency department physician's note for recommendations)  Discharge Exam: Vitals:   10/12/18 0522 10/12/18 1319  BP: (!) 147/91 (!) 142/72  Pulse: (!) 55 69  Resp:  17  Temp: 97.6 F (36.4 C) 98.2 F (36.8 C)  SpO2: 98% 98%    General: Patient denies any further chest pain, no requiring oxygen supplementation, is afebrile and in no acute distress.  Continued to have mild discomfort on his mid lower abdomen and is having hematuria. Cardiovascular: S1 and S2, no rubs, no gallops, no JVD.  Respiratory: Scattered rhonchi, normal respiratory effort, good air movement bilaterally, no wheezing, no crackles. Abdomen: Soft, mild tenderness in his mid lower section, positive bowel sounds, no guarding. Extremities: No edema, no cyanosis, no clubbing.  Discharge Instructions   Discharge Instructions    Diet - low sodium heart healthy   Complete by:  As directed    Discharge instructions   Complete by:  As directed    Take medications as prescribed Maintain adequate hydration Complete antibiotics as instructed Arrange follow-up at your earliest convenience with urology service to further evaluate ongoing hematuria. Arrange follow-up with PCP in 2 weeks. Avoid the use of NSAIDs and continue to hold Plavix until follow-up with  urology. Okay to use Tylenol for pain control as needed.   Increase activity slowly   Complete by:  As directed      Allergies as of 10/12/2018      Reactions   Benazepril Cough   Doxycycline Other (See Comments)   Unknown   Levofloxacin Hives, Swelling   Simvastatin Other (See Comments)   Pt does not remember what the reaction was. ?muscle aches   Tamsulosin Hives, Swelling   Tramadol Hcl Hives, Swelling   Sulfa Antibiotics Hives, Swelling, Rash, Other (See Comments)   Severe rash      Medication List    TAKE these medications   amoxicillin-clavulanate 875-125 MG tablet Commonly known as:  AUGMENTIN Take 1 tablet by mouth 2 (two) times daily for 14 doses.   atorvastatin 40 MG tablet Commonly known as:  LIPITOR Take 40 mg by mouth every evening.   clopidogrel 75 MG tablet Commonly known as:  PLAVIX Take 1 tablet (75 mg total) by mouth daily. Hold on to follow-up with urology service. What changed:  additional instructions   furosemide 20 MG tablet Commonly known as:  LASIX Take 1 tablet (20 mg total) by mouth as needed. What changed:  when to take this   HYDROcodone-acetaminophen 5-325 MG tablet Commonly known as:  NORCO/VICODIN Take 1-2 tablets by mouth every 4 (four) hours as needed for moderate pain.   meclizine 25 MG tablet Commonly known as:  ANTIVERT Take 25 mg by mouth 3 (three) times daily as needed for dizziness.   metoprolol succinate 50 MG 24 hr tablet Commonly known as:  TOPROL-XL Take 1 tablet (50 mg total) by mouth every evening. Take  with or immediately following a meal. What changed:    medication strength  how much to take  additional instructions   NUEDEXTA 20-10 MG Caps Generic drug:  Dextromethorphan-quiNIDine Take 1 capsule by mouth 2 (two) times daily.   RYTARY 36.25-145 MG Cpcr Generic drug:  Carbidopa-Levodopa ER Take 3 capsules by mouth 4 (four) times daily.      Allergies  Allergen Reactions  . Benazepril Cough  .  Doxycycline Other (See Comments)    Unknown  . Levofloxacin Hives and Swelling  . Simvastatin Other (See Comments)    Pt does not remember what the reaction was. ?muscle aches  . Tamsulosin Hives and Swelling  . Tramadol Hcl Hives and Swelling  . Sulfa Antibiotics Hives, Swelling, Rash and Other (See Comments)    Severe rash   Follow-up Information    Sasser, Silvestre Moment, MD. Schedule an appointment as soon as possible for a visit in 2 week(s).   Specialty:  Family Medicine Contact information: [place] [address] [phone]        Arnoldo Lenis, MD .   Specialty:  Cardiology Contact information: [place] [address] [phone]           The results of significant diagnostics from this hospitalization (including imaging, microbiology, ancillary and laboratory) are listed below for reference.    Significant Diagnostic Studies: Ct Abdomen Pelvis W Contrast  Result Date: 10/11/2018 CLINICAL DATA:  Status post TURP 09/11/2018. Hematuria for the past 3 days with pelvic and flank pain. EXAM: CT ABDOMEN AND PELVIS WITH CONTRAST TECHNIQUE: Multidetector CT imaging of the abdomen and pelvis was performed using the standard protocol following bolus administration of intravenous contrast. CONTRAST:  157mL ISOVUE-300 IOPAMIDOL (ISOVUE-300) INJECTION 61%, 46mL ISOVUE-300 IOPAMIDOL (ISOVUE-300) INJECTION 61% COMPARISON:  None. FINDINGS: Lower chest: Bibasilar atelectasis. Mild cardiomegaly without pericardial effusion. No pleural effusion. Postop changes of the GE junction. No significant hiatal hernia. Symmetric gynecomastia present. Hepatobiliary: No focal liver abnormality is seen. No gallstones, gallbladder wall thickening, or biliary dilatation. Pancreas: Unremarkable. No pancreatic ductal dilatation or surrounding inflammatory changes. Spleen: Normal in size without focal abnormality. Adrenals/Urinary Tract: Normal adrenal glands. Small left  renal hypodense cortical cyst measures 12 mm. No renal obstruction or hydronephrosis. Ureters are symmetric and decompressed. Bladder demonstrates mild wall thickening. Stomach/Bowel: Negative for bowel obstruction, significant dilatation, ileus, or free air. No fluid collection or abscess. Negative for hemorrhage or hematoma. Appendix not visualized. Vascular/Lymphatic: Intact aorta. Negative for aneurysm or dissection. No retroperitoneal hemorrhage or hematoma. Mesenteric and renal vasculature remain patent. Iliac tortuosity with minor atherosclerosis. No iliac occlusion or inflow disease. No adenopathy. Reproductive: Prostate gland is markedly enlarged with heterogeneous enhancement. Postop changes from TURP in the midline of the prostate. No prostate fluid collection. No pelvic abscess or hematoma. Other: Small fat containing left inguinal hernia. Intact abdominal wall. Musculoskeletal: Degenerative changes of the spine. Mild scoliosis. No acute osseous finding. IMPRESSION: Postop changes from TURP of the prostate. Prostate gland remains markedly enlarged with heterogeneous enhancement. But no prostatic fluid collection, abscess, or other intrapelvic abnormality by CT. No other acute intra-abdominal or pelvic finding. Minor iliac atherosclerosis Fat containing left inguinal hernia Electronically Signed   By: Jerilynn Mages.  Shick M.D.   On: 10/11/2018 12:26   Dg Chest Port 1 View  Result Date: 10/11/2018 CLINICAL DATA:  Chest pain EXAM: PORTABLE CHEST 1 VIEW COMPARISON:  None. FINDINGS: There is airspace opacity in the left lower lobe with  small left pleural effusion. The lungs elsewhere are clear. Heart is mildly enlarged with pulmonary vascularity normal. No adenopathy. There is a loop recorder on the left. No bone lesions. IMPRESSION: Airspace consolidation consistent with pneumonia left lower lobe with small left pleural effusion. Right lung clear. Heart mildly enlarged. Electronically Signed   By: Lowella Grip III M.D.   On: 10/11/2018 09:33    Microbiology: Recent Results (from the past 240 hour(s))  Urine culture     Status: None   Collection Time: 10/11/18 10:00 AM  Result Value Ref Range Status   Specimen Description   Final    URINE, CLEAN CATCH Performed at [facility], [address] [place], [place] [address]    Special Requests   Final    NONE Performed at [facility], [address] [place], [place] [address]    Culture   Final    NO GROWTH Performed at [facility], [place] [address] [place], [place] [address]    Report Status 10/12/2018 FINAL  Final  Culture, blood (routine x 2) Call MD if unable to obtain prior to antibiotics being given     Status: None (Preliminary result)   Collection Time: 10/11/18  8:00 PM  Result Value Ref Range Status   Specimen Description BLOOD LEFT ARM  Final   Special Requests   Final    BOTTLES DRAWN AEROBIC AND ANAEROBIC Blood Culture adequate volume   Culture   Final    NO GROWTH < 12 HOURS Performed at [facility], [address], [place], [place] [address]    Report Status PENDING  Incomplete  Culture, blood (routine x 2) Call MD if unable to obtain prior to antibiotics being given     Status: None (Preliminary result)   Collection Time: 10/11/18  8:06 PM  Result Value Ref Range Status   Specimen Description BLOOD LEFT HAND  Final   Special Requests   Final    BOTTLES DRAWN AEROBIC ONLY Blood Culture adequate volume   Culture   Final    NO GROWTH < 12 HOURS Performed at [facility], [address] [place],  [address]    Report Status PENDING  Incomplete     Labs: Basic Metabolic Panel: Recent Labs  Lab 10/11/18 0813  NA 139  K 4.1  CL 104  CO2 28  GLUCOSE 110*  BUN 13  CREATININE 1.11  CALCIUM 9.2   Liver Function Tests: Recent Labs  Lab 10/11/18 0813  AST 13*  ALT 6  ALKPHOS 63  BILITOT 0.9  PROT 6.9  ALBUMIN 3.8   Recent Labs  Lab 10/11/18 0813  LIPASE 23   CBC: Recent Labs   Lab 10/11/18 0813 10/12/18 0704  WBC 6.2 6.1  NEUTROABS 3.5  --   HGB 13.6 12.6*  HCT 44.6 42.8  MCV 82.9 84.6  PLT 195 184   Cardiac Enzymes: Recent Labs  Lab 10/11/18 1019 10/11/18 1436 10/11/18 2000 10/12/18 0029 10/12/18 0704  TROPONINI <0.03 <0.03 <0.03 <0.03 <0.03   BNP: BNP (last 3 results) Recent Labs    07/15/18 1053  BNP 32.2    Signed:  Barton Dubois MD.  [facility] 10/12/2018, 3:10 PM

## 2018-10-13 LAB — HIV ANTIBODY (ROUTINE TESTING W REFLEX): HIV Screen 4th Generation wRfx: NONREACTIVE

## 2018-10-13 LAB — LEGIONELLA PNEUMOPHILA SEROGP 1 UR AG: L. pneumophila Serogp 1 Ur Ag: NEGATIVE

## 2018-10-14 ENCOUNTER — Ambulatory Visit (INDEPENDENT_AMBULATORY_CARE_PROVIDER_SITE_OTHER): Payer: Medicare HMO

## 2018-10-14 DIAGNOSIS — I639 Cerebral infarction, unspecified: Secondary | ICD-10-CM

## 2018-10-14 DIAGNOSIS — R310 Gross hematuria: Secondary | ICD-10-CM | POA: Diagnosis not present

## 2018-10-14 DIAGNOSIS — N401 Enlarged prostate with lower urinary tract symptoms: Secondary | ICD-10-CM | POA: Diagnosis not present

## 2018-10-14 DIAGNOSIS — R3915 Urgency of urination: Secondary | ICD-10-CM | POA: Diagnosis not present

## 2018-10-14 NOTE — Progress Notes (Signed)
Carelink Summary Report / Loop Recorder 

## 2018-10-16 ENCOUNTER — Ambulatory Visit (INDEPENDENT_AMBULATORY_CARE_PROVIDER_SITE_OTHER): Payer: Medicare HMO | Admitting: Urology

## 2018-10-16 DIAGNOSIS — C673 Malignant neoplasm of anterior wall of bladder: Secondary | ICD-10-CM

## 2018-10-16 LAB — CULTURE, BLOOD (ROUTINE X 2)
Culture: NO GROWTH
Culture: NO GROWTH
Special Requests: ADEQUATE
Special Requests: ADEQUATE

## 2018-10-29 ENCOUNTER — Encounter: Payer: Medicare HMO | Admitting: Vascular Surgery

## 2018-10-29 ENCOUNTER — Encounter

## 2018-10-29 ENCOUNTER — Telehealth: Payer: Self-pay

## 2018-10-29 ENCOUNTER — Encounter (HOSPITAL_COMMUNITY): Payer: Medicare HMO

## 2018-10-29 NOTE — Telephone Encounter (Signed)
Spoke w/ pt and requested that he send a manual transmission b/c him home monitor has not updated in at least 14 days.  Confirmed remote transmission w/ pt wife.

## 2018-11-04 ENCOUNTER — Telehealth (INDEPENDENT_AMBULATORY_CARE_PROVIDER_SITE_OTHER): Payer: Self-pay

## 2018-11-04 NOTE — Telephone Encounter (Signed)
Wanted to know when they were due to follow up    Review of note shows patient was to f-up in November. Returned call and advised of same. She was then transferred to scheduling to make an appointment

## 2018-11-06 ENCOUNTER — Encounter: Payer: Self-pay | Admitting: Cardiology

## 2018-11-11 DIAGNOSIS — E782 Mixed hyperlipidemia: Secondary | ICD-10-CM | POA: Diagnosis not present

## 2018-11-11 DIAGNOSIS — R319 Hematuria, unspecified: Secondary | ICD-10-CM | POA: Diagnosis not present

## 2018-11-11 DIAGNOSIS — J189 Pneumonia, unspecified organism: Secondary | ICD-10-CM | POA: Diagnosis not present

## 2018-11-11 DIAGNOSIS — Z6826 Body mass index (BMI) 26.0-26.9, adult: Secondary | ICD-10-CM | POA: Diagnosis not present

## 2018-11-11 DIAGNOSIS — G20 Parkinson's disease: Secondary | ICD-10-CM | POA: Diagnosis not present

## 2018-11-11 DIAGNOSIS — I10 Essential (primary) hypertension: Secondary | ICD-10-CM | POA: Diagnosis not present

## 2018-11-14 ENCOUNTER — Ambulatory Visit (INDEPENDENT_AMBULATORY_CARE_PROVIDER_SITE_OTHER): Payer: Medicare HMO

## 2018-11-14 DIAGNOSIS — I639 Cerebral infarction, unspecified: Secondary | ICD-10-CM

## 2018-11-15 NOTE — Progress Notes (Signed)
Carelink Summary Report / Loop Recorder 

## 2018-11-25 DIAGNOSIS — G20 Parkinson's disease: Secondary | ICD-10-CM | POA: Diagnosis not present

## 2018-11-25 DIAGNOSIS — Z6826 Body mass index (BMI) 26.0-26.9, adult: Secondary | ICD-10-CM | POA: Diagnosis not present

## 2018-11-25 DIAGNOSIS — J189 Pneumonia, unspecified organism: Secondary | ICD-10-CM | POA: Diagnosis not present

## 2018-12-01 LAB — CUP PACEART REMOTE DEVICE CHECK
Date Time Interrogation Session: 20191116164146
Implantable Pulse Generator Implant Date: 20170922

## 2018-12-15 LAB — CUP PACEART REMOTE DEVICE CHECK
Date Time Interrogation Session: 20191219171104
Implantable Pulse Generator Implant Date: 20170922

## 2018-12-17 ENCOUNTER — Ambulatory Visit (INDEPENDENT_AMBULATORY_CARE_PROVIDER_SITE_OTHER): Payer: Medicare HMO

## 2018-12-17 DIAGNOSIS — I639 Cerebral infarction, unspecified: Secondary | ICD-10-CM

## 2018-12-18 ENCOUNTER — Ambulatory Visit (INDEPENDENT_AMBULATORY_CARE_PROVIDER_SITE_OTHER): Payer: Medicare HMO | Admitting: Urology

## 2018-12-18 DIAGNOSIS — R102 Pelvic and perineal pain: Secondary | ICD-10-CM

## 2018-12-18 LAB — CUP PACEART REMOTE DEVICE CHECK
Date Time Interrogation Session: 20200121183958
Implantable Pulse Generator Implant Date: 20170922

## 2018-12-18 NOTE — Progress Notes (Signed)
Carelink Summary Report / Loop Recorder 

## 2019-01-02 ENCOUNTER — Ambulatory Visit (INDEPENDENT_AMBULATORY_CARE_PROVIDER_SITE_OTHER): Payer: Medicare (Managed Care) | Admitting: Neurology

## 2019-01-02 ENCOUNTER — Encounter (INDEPENDENT_AMBULATORY_CARE_PROVIDER_SITE_OTHER): Payer: Self-pay | Admitting: Neurology

## 2019-01-02 VITALS — BP 133/85 | HR 67 | Ht 67.0 in | Wt 165.0 lb

## 2019-01-02 DIAGNOSIS — R471 Dysarthria and anarthria: Secondary | ICD-10-CM

## 2019-01-02 DIAGNOSIS — I639 Cerebral infarction, unspecified: Secondary | ICD-10-CM

## 2019-01-02 DIAGNOSIS — F482 Pseudobulbar affect: Secondary | ICD-10-CM

## 2019-01-02 DIAGNOSIS — Z72820 Sleep deprivation: Secondary | ICD-10-CM

## 2019-01-02 DIAGNOSIS — R270 Ataxia, unspecified: Secondary | ICD-10-CM

## 2019-01-02 DIAGNOSIS — G20 Parkinson's disease: Secondary | ICD-10-CM

## 2019-01-02 DIAGNOSIS — R69 Illness, unspecified: Secondary | ICD-10-CM | POA: Diagnosis not present

## 2019-01-02 MED ORDER — ISTRADEFYLLINE 20 MG PO TABS
1.0000 | ORAL_TABLET | Freq: Every day | ORAL | 3 refills | Status: DC
Start: 2019-01-02 — End: 2019-01-12

## 2019-01-02 NOTE — Progress Notes (Signed)
[facility]    Subjective:      78 y/o M with hx HTN, HLD, borderline DM, glaucoma, prior CVA, presents to Movement Disorders clinic for f/u of possible Parkinson's Disease.     Walking is worsening, falling more. Unsteady and slowed. Has fallen because of it. No LH/D reported, but a lot of 'freezing spells' where his legs get stuck.     Also his family feels his day/night are swapped, can stay up all night and sleep all day. Still on Rytary 3 capsules of 145mg  4x daily, 5 hours apart. Wants to do rehab again.     On Nuedexta now, which has resolved his crying episodes. Tolerating with no issue.     Denies heavy drinking, smoking, and drug use. Counseled to abstain from smoking. No contributory family history.      -------------------------------  History retained from initial consultation:    History is mainly from family. He was diagnosed with Parkinson's Disease in 2015, with initial symptoms being changes to his gait.  He was walking slower and his movements were slowed overall.  They report also noting tremors in the legs and hands, unclear laterality. He noticed the tremors throughout the day, but the daughter notices it more with activity, when feeding himself.     The diagnosis of Parkinson's was considered, so he was started on Sinemet, however did not take it because it gave him GI issues. During that year, he had a number of TIAs, but then in 2016 had a completed CVA.  Described a 'clot in the back of his head' by family.  Afterwards, he was having issues with speech and swallowing. Swallow study was normal.     At some point then, the dx of Parkinson's was questioned, but "tests" were completed that confirmed, though unclear what that was.  Was put back on Sinemet.  Once he is on Sinemet, his walking and tightness improves, but they are concerned about progression as he is falling frequently now. No cognitive changes, just difficulty talking.    Weak spells happen in the  morning, he tells family that he's weak. Can get to lie down, but doesn't feel dizzy, woozy or lightheaded.  He feels that he starts to feel weak after the medicine.     Last complete PT in December last year. He has completed SLT which helped.    + uncontrollable laughing and crying noted, depression/anxiety and fatigue.     DATscan reviewed (08/10/2016):    Abnormal image grade 3: Virtually absent uptake bilaterally affecting both putamen and caudate nuclei.  This pattern of activity can be seen with Parkinson's disease or related syndromes.    No family history of Parkinson's Disease or tremor. No history significant head injury or LOC.  No history of neuroleptic or chemical exposure.     Prior history reviewed:    Nyron Mozer was seen today in the movement disorders clinic for neurologic consultation at the request of [name], MD. The consultation is for the evaluation of PD. No records are available to me from his prior neurologist. Therefore, all history was obtained from the patient and wife. He was previously being seen in [place], [place]. He was dx with with PD about 3 years ago and wife states that his first sx was was slow handwriting and slow to eat and slight jaw tremor. He was started on carbidopa/levodopa 10/100 tid and pt doesn't think that it helped. It was taking it at 8am/1pm/8-9pm. Lunchtime is at noon. He  takes the last at bedtime. However, he admits that he has not taken it for months.    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 When she got to him, he was just laying on the floor. They called 911. He was not on ASA at the time. He was put on plavix and lipitor. Speech has been biggest issue since the stroke. States that he never went through speech therapy, even when he was in rehab. He did physical therapy only. R leg remains a bit weak after stroke.    07/18/16 update: The patient is seen today in follow-up. I have reviewed many records  from several hospitalizations and also his prior neurologist records since our last visit. He was first seen by his prior neurologist in [place] on 06/15/2014 with complaints of speech changes and dragging his right leg and trouble buttoning his clothing. On examination, she reports no bradykinesia at that visit, but she reports that he had lip tremor. He was dragging his right leg and his rapid alternating movements were normal. She did an MRI of the brain just following that visit and this was done on 07/06/2014. There was a small acute right basal ganglia infarct. He followed up with her just after that in August, 2015 and was started on levodopa, 10/100 3 times per day, as she felt that his infarct was acute, whereas his symptoms were for 6 months and he had right leg symptoms and right sided infarct. 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 In September, 2015 the notes said that the patient noted significant improvement on aspirin and a statin. He was then hospitalized in May, 2016 and she followed up with him after that and said that there was just mild slurring of speech. On 07/19/2015 she did have him wearing an event monitor for one month that was negative. It does appear that he had been off of levodopa ever since his infarct in May, 2016. I was able to get his hospital records from [facility] from his infarction. He was admitted on 04/08/2015, but they reported left-sided weakness and dizziness. He had an echocardiogram on 04/09/2015 with an ejection fraction of 60-65%. An EEG was normal. An MRA of the neck was normal. An MRI of the brain stated that DWI appears to demonstrate multiple punctate areas of high signal involving the cerebellar hemispheres and cerebellar vermis. I did have the opportunity to review these images once they became available, and DWI was in fact positive in both cerebellar hemispheres and amongst the vermis. His carotid ultrasound was negative. There were  minimal notes regarding his speech but nothing stated that it was particularly slurred. He was then recently hospitalized once again more on 07/10/2016 and I again reviewed those records. He was hospitalized for inability to swallow. Pt states that swallowing issues are with liquids and not solids because he was trying to take too much in the mouth at once. An MRI without gadolinium was done. I did not get those images. There was reported to be a few old cerebellar infarctions. There is reported to be an old infarction in the left frontal region. DWI was negative. It was concluded that his swallowing issues were from Parkinsons disease and his refusal to take levodopa. He did have a barium swallow that was reported to show mild oropharyngeal dysphagia, but no dietary changes were made, with the exceptions of to take small bites and  administer meds one at a time. He ended up having a very detailed EMG at our office following these hospitalizations to rule out motor neuron disease and there was no evidence of this.    09/08/16 update: Pt returns accompanied by his wife who supplements the history. Since our last visit the patient had a DaT scan in 07/2016 and it was abnormal demonstrating decreased uptake, being virtually absent in the bilateral caudate and the putamen. Wife does state that when he was last hospitalized, he had been given levodopa and it helped but was d/c in the hospital. He also saw Dr. Johney Frame and had a TEE on 08/18/16 and that was normal with the exception of evidence of a PFO. He had a LE doppler on 08/18/16 and that was normal. He had a loop recorder implanted on 08/18/16 and as of 08/28/16 there were no episodes of atrial fibrillation.       The following portions of the patient's history were reviewed and updated as appropriate: allergies, current medications, past medical history, past social history, past surgical history and problem list.    Review of Systems  + vocal changes, gait disorder.   No recent illness. Denies fever, chills, cough, sinus pain, eye pain, eye redness, ear pain, rhinorrhea, sore throat, chest pain, SOB, wheezing, abd pain, Nausea, Vomiting, diarrhea, constipation, dysuria, or rashes.  All other systems reviewed and are negative except as previously noted in the HPI.    Objective:     Vitals:    01/02/19 1443   BP: 133/85   Pulse: 67     Constitutional: NAD   Eyes: Clear conjunctiva  Cardiovascular: RRR  Respiratory: CTA B   Musculoskeletal: stooped posture, normal muscle bulk.  Integumentary: No abnormal rash noted  Psych: Flat affect, decreased blink rate. No more emotionality noted.     Neurological:   Mental Status: Alert & oriented to person, place, month, & year.   Language: Dysarthric. Difficult to understand.      Cranial Nerves:  II, III: PERRL  III, IV, VI: Mild limitation to vertical gaze, No nystagmus, no palsies, no ptosis  V: Intact to LT V1-V3 distribution bilaterally.   VI: Symmetrical face and expression.   VIII: Hearing intact to finger rub bilaterally.   IX, X: Palate/Uvula elevates symmetrically.   XI: 5/5 Trapezius & SCM bilaterally.   XII: Tongue is midline.     Motor Exam: Strength 5/5 throughout and symmetric.    Sensation: Sensation to LT intact grossly through symmetrically. Romberg negative.    Cerebellar:   Finger to Nose: intact bilaterally with mild terminal dysmetria on FTN.    Reflexes: 2+ throughout, symmetric, with down-going toes. Negative Hoffman bilaterally.     Station/ Gait: Wider based, short stepped.  Shuffle.  Absent arm swing bilaterally, arms out.  Stooped. Freezing evident.     Tone increased throughout.  Fine motor use of both hands is slowed to RAM, finger tapping and open/close, R>L but more fluid with no freezing.  Foot tapping also reduced in this laterality.     The following was discussed with patient:   Exercise -- even 20 minutes 3x per week makes a difference   Alcohol   Tobacco. Smoking is a major risk factor for  cardiovascular disease   Diet   Consider PSG   Treat AF   Meds   Antiplatelet or anticoagulant for ischemic stroke. Risk, benefit, toxicity was discussed.    Statins reduce the risk of risk. Risk, benefit, toxicity  discussed.   Controlling your blood pressure is important   Signs and symptoms of stroke   Call 911 for stroke like symptoms    Since this patient has had a well documented stroke or cardiovascular risk factors it is paramount that continuing secondary stroke prevention measures be applied and reinforced regularly.     EPWORTH SLEEPINESS SCALE  How Likely Are You to Fall Asleep    Sitting and reading?      1- Slight chance of dozing or sleeping  Watching TV?       1- Slight chance of dozing or sleeping  Sitting inactive in a Public Space?    1- Slight chance of dozing or sleeping  Being a passenger in a car for an hour or more?  1- Slight chance of dozing or sleeping  Sitting quietly after lunch (no alcohol)?   1- Slight chance of dozing or sleeping  Stopped for a few minutes in traffic while driving?  1- Slight chance of dozing or sleeping  Total Score       6   Score 0-9 = Not sleepy   Score 10-17  = Moderate sleepy   Score 18+  = Very sleepy    Assessment:     78 y/o M with hx HTN, HLD, borderline DM, glaucoma, prior CVA, presents to Movement Disorders clinic for f/u of Parkinson's Disease. 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 It is unclear what symptoms are CVA related and which are PD related.      His gait is regressed making him a fall risk.  He did better in the past with conservative PT and OT, restarting home therapy.  Referral written. HOME services of medical necessity d/t inability to travel safely, homebound status.     Regarding the treatment of his Parkinson's disease, we will begin a trial of Nourianz 20mg  daily with hopes of  delivering a 24-hour stimulant to his dopaminergic system and possibly improving the gait component which is related to Parkinson's disease.  He is to continue Rytary unchanged.    We also discussed the need to follow with cardiology as he is doing.  He does have unpredictable episodes of quick, shortness of breath.  Unlikely that this is related to his Parkinson's disease, but if cleared by cardiology and pulmonology, can consider an as needed therapy.    Also discussed the impact of his prior stroke and secondary stroke prevention, with screening completed as above.     Plan:     1) Parkinson's Disease -   - Continue Rytary to 3 capsules 145mg  4x daily, 5 hours apart.  - Begin Nourianz 20mg  daily.  - Cannot have MAO-B inhibitors d/t Nuedexta use.     2) Gait disorder - multifactorial; PD, comorbid medical issues, prior CVA.  A severe, chronic progressing condition. Fall risk   - Restart PT/OT/SLT and continue exercises.   - Referred to home health for services.   - Discussed fall risk at length.     3) Dysarthria -   - Continue SLT exercises.     4) Prior CVA - continue Plavix and statin for secondary stroke prevention.  - screening as above.     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 Patient can follow up sooner if needed. In the meantime, patient  will contact the office with any questions or concerns.     -----------------------------------------------------------    Elenore Paddy, MD  Director, Movement Disorders Specialist  [facility]  [facility]    [address]  [address]  [place] [address]    [place], [place] [address]  T [phone]   F [phone]   T [phone]   F [phone]     [email or web address]

## 2019-01-02 NOTE — Patient Instructions (Signed)
[facility]    Our plan:     1) Restart home PT/OT and speech.      2) Begin Nourianz 20mg  every morning.      3) Continue Rytary unchanged.    Keep me updated and return to clinic in 3 months.    Today's Visit:      In today's visit I reviewed your medications and records relating your health - prior testing, blood work, reports of other health care providers present in your electronic medical record.     If you have records from non-[facility] doctors please send to [place] or bring to next office visit.     A copy of today's visit will be sent to your referring doctor and/or primary care doctor.    Let me know if there are things we could have done better for your office visit.    Patient satisfaction survey:      If you receive a patient satisfaction survey, I would greatly appreciate it if you would complete it.     We are like a car dealer where we aim for a score of 9 or 10.  If you had an experience that was less than a 9 or 10, please let me know so we can improve. If you had a good experience today, please let us know too.  We value your feedback.     Contact me online:      Patient Portal online - Please sign up for MyChart -- this is the best way to communicate me.  There is a messaging feature which can send messages directly to my inbox.  It is the best way to communicate with me and get test results, medication refills or ask questions.     You can expect to get a response within 24-48 hours during weekdays - if you do not, resend your request and inform us we did not respond. My goal is for every question answered as soon as possible. Average response for a phone call is 3 days due to the volume we receive (which is why MyChart is preferred).    MyChart should not be used if you are having a medical emergency -- call 911 in that case.     Coupons for medication:      If you would like to see if samples or vouchers available for your medicines please consider checking  online.  Most medicines have web sites where you can print coupons or vouchers for you co-pay.     For example: AutomobileBuzz.is,  DSLRemote.se, Namendaxr.com, http://www.walker-hill.info/, Rytary.com, Vimpat.com    Thank you for trusting me with your health.      Take care,      Elsa Ploch "Johnston Ebbs, MD  Director, Movement Disorders Specialist  [facility]  [facility]    [email or web address]      [address]  [address]  [place] [address]    [place], [place] [address]  T [phone]   F [phone]   T [phone]   F [phone]

## 2019-01-03 ENCOUNTER — Encounter (INDEPENDENT_AMBULATORY_CARE_PROVIDER_SITE_OTHER): Payer: Self-pay | Admitting: Neurology

## 2019-01-06 ENCOUNTER — Telehealth (INDEPENDENT_AMBULATORY_CARE_PROVIDER_SITE_OTHER): Payer: Self-pay

## 2019-01-06 NOTE — Telephone Encounter (Signed)
PA Status Update:  .           PA Auth for: Nourianz 20 mg tablets  .           Sig: take 1 tablet by mouth daily  .           Status: PENDING     .           Additional Notes: submitted PA via Cover My Meds (Key: [id number]). If [employer] D has not replied to your request within 24 hours please contact [employer] D at [phone].

## 2019-01-06 NOTE — Telephone Encounter (Signed)
Request received and beginning investigation     Nourianz 20 mg tablets    Take 1 tablet by mouth daily

## 2019-01-07 NOTE — Telephone Encounter (Addendum)
Requesting prior authorization for Nourianz 20 mg tablets     PA Outcome APPROVED    Quantity of 30 for a day supply of 30     [employer]    Reference #? [id number]    Dates in effect from 11/25/2018 through 11/27/2019    Pharmacy and phone number [facility] [phone]

## 2019-01-08 ENCOUNTER — Ambulatory Visit (INDEPENDENT_AMBULATORY_CARE_PROVIDER_SITE_OTHER): Payer: Medicare HMO | Admitting: Urology

## 2019-01-08 DIAGNOSIS — C673 Malignant neoplasm of anterior wall of bladder: Secondary | ICD-10-CM | POA: Diagnosis not present

## 2019-01-12 ENCOUNTER — Observation Stay (HOSPITAL_COMMUNITY): Payer: Medicare HMO

## 2019-01-12 ENCOUNTER — Emergency Department (HOSPITAL_COMMUNITY): Payer: Medicare HMO

## 2019-01-12 ENCOUNTER — Encounter (HOSPITAL_COMMUNITY): Payer: Self-pay | Admitting: Emergency Medicine

## 2019-01-12 ENCOUNTER — Inpatient Hospital Stay (HOSPITAL_COMMUNITY)
Admission: EM | Admit: 2019-01-12 | Discharge: 2019-01-16 | DRG: 872 | Disposition: A | Discharge status: Home / Self Care | Payer: Medicare HMO | Attending: Family Medicine | Admitting: Family Medicine

## 2019-01-12 ENCOUNTER — Other Ambulatory Visit (INDEPENDENT_AMBULATORY_CARE_PROVIDER_SITE_OTHER): Payer: Self-pay | Admitting: Neurology

## 2019-01-12 DIAGNOSIS — G20 Parkinson's disease: Secondary | ICD-10-CM

## 2019-01-12 DIAGNOSIS — N390 Urinary tract infection, site not specified: Secondary | ICD-10-CM | POA: Diagnosis not present

## 2019-01-12 DIAGNOSIS — I10 Essential (primary) hypertension: Secondary | ICD-10-CM | POA: Diagnosis not present

## 2019-01-12 DIAGNOSIS — R109 Unspecified abdominal pain: Secondary | ICD-10-CM | POA: Diagnosis not present

## 2019-01-12 DIAGNOSIS — R69 Illness, unspecified: Secondary | ICD-10-CM | POA: Diagnosis not present

## 2019-01-12 DIAGNOSIS — Z7902 Long term (current) use of antithrombotics/antiplatelets: Secondary | ICD-10-CM

## 2019-01-12 DIAGNOSIS — Q211 Atrial septal defect: Secondary | ICD-10-CM

## 2019-01-12 DIAGNOSIS — A419 Sepsis, unspecified organism: Secondary | ICD-10-CM | POA: Diagnosis not present

## 2019-01-12 DIAGNOSIS — N401 Enlarged prostate with lower urinary tract symptoms: Secondary | ICD-10-CM | POA: Diagnosis present

## 2019-01-12 DIAGNOSIS — B965 Pseudomonas (aeruginosa) (mallei) (pseudomallei) as the cause of diseases classified elsewhere: Secondary | ICD-10-CM | POA: Diagnosis present

## 2019-01-12 DIAGNOSIS — Z9841 Cataract extraction status, right eye: Secondary | ICD-10-CM

## 2019-01-12 DIAGNOSIS — R7303 Prediabetes: Secondary | ICD-10-CM | POA: Diagnosis present

## 2019-01-12 DIAGNOSIS — R51 Headache: Secondary | ICD-10-CM | POA: Diagnosis not present

## 2019-01-12 DIAGNOSIS — I6523 Occlusion and stenosis of bilateral carotid arteries: Secondary | ICD-10-CM | POA: Diagnosis present

## 2019-01-12 DIAGNOSIS — M25561 Pain in right knee: Secondary | ICD-10-CM | POA: Diagnosis not present

## 2019-01-12 DIAGNOSIS — Z8711 Personal history of peptic ulcer disease: Secondary | ICD-10-CM

## 2019-01-12 DIAGNOSIS — R0689 Other abnormalities of breathing: Secondary | ICD-10-CM | POA: Diagnosis not present

## 2019-01-12 DIAGNOSIS — Z8551 Personal history of malignant neoplasm of bladder: Secondary | ICD-10-CM

## 2019-01-12 DIAGNOSIS — S4991XA Unspecified injury of right shoulder and upper arm, initial encounter: Secondary | ICD-10-CM | POA: Diagnosis not present

## 2019-01-12 DIAGNOSIS — M25562 Pain in left knee: Secondary | ICD-10-CM | POA: Diagnosis not present

## 2019-01-12 DIAGNOSIS — R319 Hematuria, unspecified: Secondary | ICD-10-CM

## 2019-01-12 DIAGNOSIS — F0280 Dementia in other diseases classified elsewhere without behavioral disturbance: Secondary | ICD-10-CM

## 2019-01-12 DIAGNOSIS — Z823 Family history of stroke: Secondary | ICD-10-CM

## 2019-01-12 DIAGNOSIS — Z79891 Long term (current) use of opiate analgesic: Secondary | ICD-10-CM

## 2019-01-12 DIAGNOSIS — M25511 Pain in right shoulder: Secondary | ICD-10-CM | POA: Diagnosis not present

## 2019-01-12 DIAGNOSIS — R296 Repeated falls: Secondary | ICD-10-CM | POA: Diagnosis present

## 2019-01-12 DIAGNOSIS — E785 Hyperlipidemia, unspecified: Secondary | ICD-10-CM | POA: Diagnosis present

## 2019-01-12 DIAGNOSIS — N529 Male erectile dysfunction, unspecified: Secondary | ICD-10-CM | POA: Diagnosis present

## 2019-01-12 DIAGNOSIS — R17 Unspecified jaundice: Secondary | ICD-10-CM | POA: Diagnosis present

## 2019-01-12 DIAGNOSIS — R1084 Generalized abdominal pain: Secondary | ICD-10-CM | POA: Diagnosis not present

## 2019-01-12 DIAGNOSIS — H409 Unspecified glaucoma: Secondary | ICD-10-CM | POA: Diagnosis present

## 2019-01-12 DIAGNOSIS — I69351 Hemiplegia and hemiparesis following cerebral infarction affecting right dominant side: Secondary | ICD-10-CM

## 2019-01-12 DIAGNOSIS — S8992XA Unspecified injury of left lower leg, initial encounter: Secondary | ICD-10-CM | POA: Diagnosis not present

## 2019-01-12 DIAGNOSIS — R1030 Lower abdominal pain, unspecified: Secondary | ICD-10-CM | POA: Diagnosis present

## 2019-01-12 DIAGNOSIS — G20A1 Parkinson's disease without dyskinesia, without mention of fluctuations: Secondary | ICD-10-CM | POA: Diagnosis present

## 2019-01-12 DIAGNOSIS — N138 Other obstructive and reflux uropathy: Secondary | ICD-10-CM | POA: Diagnosis present

## 2019-01-12 DIAGNOSIS — Z79899 Other long term (current) drug therapy: Secondary | ICD-10-CM

## 2019-01-12 DIAGNOSIS — N400 Enlarged prostate without lower urinary tract symptoms: Secondary | ICD-10-CM | POA: Diagnosis present

## 2019-01-12 DIAGNOSIS — D494 Neoplasm of unspecified behavior of bladder: Secondary | ICD-10-CM | POA: Diagnosis present

## 2019-01-12 DIAGNOSIS — M7989 Other specified soft tissue disorders: Secondary | ICD-10-CM | POA: Diagnosis present

## 2019-01-12 DIAGNOSIS — R1312 Dysphagia, oropharyngeal phase: Secondary | ICD-10-CM | POA: Diagnosis present

## 2019-01-12 DIAGNOSIS — S8991XA Unspecified injury of right lower leg, initial encounter: Secondary | ICD-10-CM | POA: Diagnosis not present

## 2019-01-12 DIAGNOSIS — Z961 Presence of intraocular lens: Secondary | ICD-10-CM | POA: Diagnosis present

## 2019-01-12 DIAGNOSIS — R519 Headache, unspecified: Secondary | ICD-10-CM

## 2019-01-12 LAB — URINALYSIS, ROUTINE W REFLEX MICROSCOPIC
Bilirubin Urine: NEGATIVE
Glucose, UA: NEGATIVE mg/dL
Ketones, ur: 5 mg/dL — AB
Nitrite: NEGATIVE
Protein, ur: 30 mg/dL — AB
Specific Gravity, Urine: 1.026 (ref 1.005–1.030)
pH: 5 (ref 5.0–8.0)

## 2019-01-12 LAB — CSF CELL COUNT WITH DIFFERENTIAL
RBC Count, CSF: 71 /mm3 — ABNORMAL HIGH
Tube #: 4
WBC, CSF: 0 /mm3 (ref 0–5)

## 2019-01-12 LAB — COMPREHENSIVE METABOLIC PANEL
ALT: 22 U/L (ref 0–44)
AST: 18 U/L (ref 15–41)
Albumin: 3.8 g/dL (ref 3.5–5.0)
Alkaline Phosphatase: 72 U/L (ref 38–126)
Anion gap: 10 (ref 5–15)
BUN: 15 mg/dL (ref 8–23)
CO2: 25 mmol/L (ref 22–32)
Calcium: 8.8 mg/dL — ABNORMAL LOW (ref 8.9–10.3)
Chloride: 103 mmol/L (ref 98–111)
Creatinine, Ser: 1.31 mg/dL — ABNORMAL HIGH (ref 0.61–1.24)
GFR calc Af Amer: >60 mL/min (ref >60)
GFR calc non Af Amer: 53 mL/min — ABNORMAL LOW (ref >60)
Glucose, Bld: 147 mg/dL — ABNORMAL HIGH (ref 70–99)
Potassium: 3.5 mmol/L (ref 3.5–5.1)
Sodium: 138 mmol/L (ref 135–145)
Total Bilirubin: 3.6 mg/dL — ABNORMAL HIGH (ref 0.3–1.2)
Total Protein: 7.2 g/dL (ref 6.5–8.1)

## 2019-01-12 LAB — CBC WITH DIFFERENTIAL/PLATELET
Abs Immature Granulocytes: 0.16 10*3/uL — ABNORMAL HIGH (ref 0.00–0.07)
Basophils Absolute: 0 10*3/uL (ref 0.0–0.1)
Basophils Relative: 0 %
Eosinophils Absolute: 0 10*3/uL (ref 0.0–0.5)
Eosinophils Relative: 0 %
HCT: 46.4 % (ref 39.0–52.0)
Hemoglobin: 13.7 g/dL (ref 13.0–17.0)
Immature Granulocytes: 1 %
Lymphocytes Relative: 8 %
Lymphs Abs: 1.2 10*3/uL (ref 0.7–4.0)
MCH: 24.6 pg — ABNORMAL LOW (ref 26.0–34.0)
MCHC: 29.5 g/dL — ABNORMAL LOW (ref 30.0–36.0)
MCV: 83.2 fL (ref 80.0–100.0)
Monocytes Absolute: 1.1 10*3/uL — ABNORMAL HIGH (ref 0.1–1.0)
Monocytes Relative: 8 %
Neutro Abs: 11.8 10*3/uL — ABNORMAL HIGH (ref 1.7–7.7)
Neutrophils Relative %: 83 %
Platelets: 186 10*3/uL (ref 150–400)
RBC: 5.58 MIL/uL (ref 4.22–5.81)
RDW: 13.4 % (ref 11.5–15.5)
WBC: 14.4 10*3/uL — ABNORMAL HIGH (ref 4.0–10.5)
nRBC: 0 % (ref 0.0–0.2)

## 2019-01-12 LAB — PROTEIN, CSF: Total  Protein, CSF: 32 mg/dL (ref 15–45)

## 2019-01-12 LAB — LACTIC ACID, PLASMA
Lactic Acid, Venous: 2.3 mmol/L (ref 0.5–1.9)
Lactic Acid, Venous: 1.7 mmol/L (ref 0.5–1.9)

## 2019-01-12 LAB — INFLUENZA PANEL BY PCR (TYPE A & B)
Influenza A By PCR: NEGATIVE
Influenza B By PCR: NEGATIVE

## 2019-01-12 LAB — CBG MONITORING, ED: Glucose-Capillary: 140 mg/dL — ABNORMAL HIGH (ref 70–99)

## 2019-01-12 LAB — GLUCOSE, CSF: Glucose, CSF: 87 mg/dL — ABNORMAL HIGH (ref 40–70)

## 2019-01-12 MED ORDER — ISTRADEFYLLINE 20 MG PO TABS
1.0000 | ORAL_TABLET | Freq: Every day | ORAL | 3 refills | Status: DC
Start: 2019-01-12 — End: 2019-09-29

## 2019-01-12 MED ORDER — PIPERACILLIN-TAZOBACTAM 3.375 G IVPB 30 MIN
3.3750 g | Freq: Once | INTRAVENOUS | Status: AC
  Administered 2019-01-12: 3.375 g via INTRAVENOUS
  Filled 2019-01-12: qty 50

## 2019-01-12 MED ORDER — SODIUM CHLORIDE 0.9 % IV SOLN
INTRAVENOUS | Status: DC
  Administered 2019-01-12: 75 mL/h via INTRAVENOUS
  Administered 2019-01-13 – 2019-01-15 (×4): via INTRAVENOUS

## 2019-01-12 MED ORDER — METOPROLOL SUCCINATE ER 50 MG PO TB24
50.0000 mg | ORAL_TABLET | Freq: Every evening | ORAL | Status: DC
  Administered 2019-01-13 – 2019-01-16 (×4): 50 mg via ORAL
  Filled 2019-01-12 (×4): qty 1

## 2019-01-12 MED ORDER — ACETAMINOPHEN 500 MG PO TABS
1000.0000 mg | ORAL_TABLET | Freq: Once | ORAL | Status: AC
  Administered 2019-01-12: 1000 mg via ORAL
  Filled 2019-01-12: qty 2

## 2019-01-12 MED ORDER — MECLIZINE HCL 12.5 MG PO TABS: 25.0000 mg | ORAL_TABLET | Freq: Three times a day (TID) | ORAL | Status: DC | PRN

## 2019-01-12 MED ORDER — CLOPIDOGREL BISULFATE 75 MG PO TABS
75.0000 mg | ORAL_TABLET | Freq: Every day | ORAL | Status: DC
  Administered 2019-01-13 – 2019-01-16 (×4): 75 mg via ORAL
  Filled 2019-01-12 (×4): qty 1

## 2019-01-12 MED ORDER — HYDROCODONE-ACETAMINOPHEN 5-325 MG PO TABS
ORAL_TABLET | ORAL | Status: AC
  Filled 2019-01-12: qty 1

## 2019-01-12 MED ORDER — ISTRADEFYLLINE 20 MG PO TABS: 1.0000 | ORAL_TABLET | Freq: Every day | ORAL | Status: DC

## 2019-01-12 MED ORDER — HYDROCODONE-ACETAMINOPHEN 5-325 MG PO TABS
1.0000 | ORAL_TABLET | ORAL | Status: DC | PRN
  Administered 2019-01-12 – 2019-01-14 (×5): 1 via ORAL
  Filled 2019-01-12 (×4): qty 1

## 2019-01-12 MED ORDER — ONDANSETRON HCL 4 MG PO TABS: 4.0000 mg | ORAL_TABLET | Freq: Four times a day (QID) | ORAL | Status: DC | PRN

## 2019-01-12 MED ORDER — ATORVASTATIN CALCIUM 40 MG PO TABS
40.0000 mg | ORAL_TABLET | Freq: Every evening | ORAL | Status: DC
  Administered 2019-01-12 – 2019-01-16 (×5): 40 mg via ORAL
  Filled 2019-01-12 (×5): qty 1

## 2019-01-12 MED ORDER — VANCOMYCIN HCL 10 G IV SOLR
2000.0000 mg | Freq: Once | INTRAVENOUS | Status: AC
  Administered 2019-01-12: 2000 mg via INTRAVENOUS
  Filled 2019-01-12 (×2): qty 2000

## 2019-01-12 MED ORDER — SODIUM CHLORIDE 0.9 % IV SOLN
1.0000 g | INTRAVENOUS | Status: DC
  Administered 2019-01-12: 1 g via INTRAVENOUS
  Filled 2019-01-12: qty 10

## 2019-01-12 MED ORDER — FINASTERIDE 5 MG PO TABS
5.0000 mg | ORAL_TABLET | Freq: Every day | ORAL | Status: DC
  Administered 2019-01-13 – 2019-01-16 (×4): 5 mg via ORAL
  Filled 2019-01-12 (×6): qty 1

## 2019-01-12 MED ORDER — PIPERACILLIN-TAZOBACTAM 3.375 G IVPB
3.3750 g | Freq: Three times a day (TID) | INTRAVENOUS | Status: DC
  Administered 2019-01-13 – 2019-01-16 (×10): 3.375 g via INTRAVENOUS
  Filled 2019-01-12 (×9): qty 50

## 2019-01-12 MED ORDER — VANCOMYCIN HCL 10 G IV SOLR
1250.0000 mg | INTRAVENOUS | Status: DC
  Administered 2019-01-13: 1250 mg via INTRAVENOUS
  Filled 2019-01-12 (×2): qty 1250

## 2019-01-12 MED ORDER — SODIUM CHLORIDE 0.9 % IV SOLN
1000.0000 mL | INTRAVENOUS | Status: DC
  Administered 2019-01-12: 1000 mL via INTRAVENOUS

## 2019-01-12 MED ORDER — ONDANSETRON HCL 4 MG/2ML IJ SOLN: 4.0000 mg | Freq: Four times a day (QID) | INTRAMUSCULAR | Status: DC | PRN

## 2019-01-12 MED ORDER — ACETAMINOPHEN 325 MG PO TABS
650.0000 mg | ORAL_TABLET | Freq: Four times a day (QID) | ORAL | Status: DC | PRN
  Administered 2019-01-13: 650 mg via ORAL
  Filled 2019-01-12: qty 2

## 2019-01-12 MED ORDER — DEXTROMETHORPHAN-QUINIDINE 20-10 MG PO CAPS
1.0000 | ORAL_CAPSULE | Freq: Two times a day (BID) | ORAL | Status: DC
  Administered 2019-01-13 – 2019-01-16 (×6): 1 via ORAL
  Filled 2019-01-12: qty 1

## 2019-01-12 MED ORDER — IOHEXOL 300 MG/ML  SOLN
100.0000 mL | Freq: Once | INTRAMUSCULAR | Status: AC | PRN
  Administered 2019-01-12: 100 mL via INTRAVENOUS

## 2019-01-12 MED ORDER — ACETAMINOPHEN 650 MG RE SUPP: 650.0000 mg | Freq: Four times a day (QID) | RECTAL | Status: DC | PRN

## 2019-01-12 MED ORDER — CARBIDOPA-LEVODOPA ER 36.25-145 MG PO CPCR
3.0000 | ORAL_CAPSULE | Freq: Four times a day (QID) | ORAL | Status: DC
  Administered 2019-01-13 – 2019-01-16 (×13): 3 via ORAL
  Filled 2019-01-12: qty 3

## 2019-01-12 MED ORDER — LIDOCAINE HCL (PF) 1 % IJ SOLN
5.0000 mL | Freq: Once | INTRAMUSCULAR | Status: DC
  Filled 2019-01-12: qty 6

## 2019-01-12 NOTE — Telephone Encounter (Signed)
Prescription sent

## 2019-01-12 NOTE — ED Notes (Signed)
Pt is very difficult stick.  Labs obtained, but difficulty inserting second line.

## 2019-01-12 NOTE — ED Triage Notes (Signed)
Pt brought in by wife after having a bad headache since Friday.  Pt also grimaces to abdominal palpation.  Wife states pt has just c/o a bad headache, especially since yesterday.

## 2019-01-12 NOTE — ED Notes (Signed)
Pt's wife to bring in meds from home in am.

## 2019-01-12 NOTE — ED Notes (Signed)
Sepsis fluids not indicated at this time per Dr. Sabra Heck.

## 2019-01-12 NOTE — Progress Notes (Signed)
Pharmacy Antibiotic Note  Mike Nixon is a 78 y.o. male admitted on 01/12/2019 with sepsis.  Pharmacy has been consulted for Vancomycin and Zosyn dosing.  Plan: Vancomycin 2000 mg IV x 1 dose. Vancomycin 1250 mg IV every 24 hours.  Goal trough 15-20 mcg/mL.  Zosyn 3.375g IV every 8 hours. Monitor labs, c/s, and vanco trough as indicated.  Height: 6' (182.9 cm) Weight: 180 lb (81.6 kg) IBW/kg (Calculated) : 77.6  Temp (24hrs), Avg:100.1 F (37.8 C), Min:98.8 F (37.1 C), Max:102 F (38.9 C)  Recent Labs  Lab 01/12/19 1242 01/12/19 1258 01/12/19 1441  WBC 14.4*  --   --   CREATININE 1.31*  --   --   LATICACIDVEN  --  2.3* 1.7    Estimated Creatinine Clearance: 52.7 mL/min (A) (by C-G formula based on SCr of 1.31 mg/dL (H)).    Allergies  Allergen Reactions  . Benazepril Cough  . Doxycycline Other (See Comments)    Unknown  . Levofloxacin Hives and Swelling  . Simvastatin Other (See Comments)    Pt does not remember what the reaction was. ?muscle aches  . Tamsulosin Hives and Swelling  . Tramadol Hcl Hives and Swelling  . Sulfa Antibiotics Hives, Swelling, Rash and Other (See Comments)    Severe rash    Antimicrobials this admission: Vanco 2/16 >>  Zosy 2/16 >>   Dose adjustments this admission: N/A  Microbiology results: 2/16 BCx: pending CSF culture: pending  2/16 urince cx: pending   Thank you for allowing pharmacy to be a part of this patient's care.  Ramond Craver 01/12/2019 7:17 PM

## 2019-01-12 NOTE — ED Provider Notes (Signed)
[facility] EMERGENCY DEPARTMENT Provider Note   CSN: [id number] Arrival date & time: 01/12/19  1212     History   Chief Complaint Chief Complaint  Patient presents with  . Fever    HPI Mike Nixon is a 78 y.o. male.  HPI  The patient is a 78 year old male, he is currently on Plavix because of history of a stroke in 2016, he has a history of Parkinson's disease and has recently been started on new medications, has developed a fever up to 102 degrees with a headache which has been severe for the last 2 days.  According to the patient's significant other who is the primary historian is the patient is unable to give much in the way of history he has been complaining of a worsening headache, he is at his baseline with regards to mental status except that he has seemed a little more confused lately.  He has had difficulty walking because of his parkinsonism and prior stroke.  He has not been having any vomiting or diarrhea, he has not had any dysuria however overnight he started to have the feeling like he needed to urinate but nothing would come out.  There is been no coughing, no rashes, no blood in the stool.  Symptoms have been persistent gradually worsening and became severe today prompting their visit to the emergency department.  Level 5 caveat applies secondary to altered mental status.  Past Medical History:  Diagnosis Date  . Anticoagulant long-term use    PLAVIX  . Benign localized prostatic hyperplasia with lower urinary tract symptoms (LUTS)   . Bladder tumor   . Borderline diabetes    watches diet  . ED (erectile dysfunction) of organic origin   . Gait disturbance, post-stroke    uses wheelchair long distance  . Glaucoma, right eye   . History of bleeding peptic ulcer    03/ 1979  s/p  surgery  . History of concussion    1987 no loc-- no residual  . History of CVA with residual deficit    05/ 2016 CYPTOGENIC CVA --- RESIDUAL GAIT DISTURBANCE AND SIALORRHEA ACCESS  (DROOLING)  . Hyperlipidemia   . Hypertension   . Mild atherosclerosis of both carotid arteries    per duplex from outside facitlity 04-08-2015 less then 50% stenosis bilateral ICA  . Parkinson's disease [facility]) neurologist -- dr r. Mitzi Hansen falconer ([place] [facility], [facility])   dx 2015  . PBA (pseudobulbar affect)    laughter-- residual from cva 05/ 2016  . PFO (patent foramen ovale)    per TEE 08-18-2016  . Speech impairment    post-stroke  . Status post placement of implantable loop recorder 08/18/2016   placed by dr allred  . Wears glasses     Patient Active Problem List   Diagnosis Date Noted  . Chest pain in adult 10/11/2018  . Hematuria 10/11/2018  . Community acquired pneumonia 10/11/2018  . CVA (cerebral vascular accident) ([place]) 10/11/2018  . Parkinson's disease ([place]) 10/11/2018  . Benign essential HTN 10/11/2018  . HLD (hyperlipidemia) 10/11/2018  . Bladder tumor 05/07/2017    Past Surgical History:  Procedure Laterality Date  . APPENDECTOMY  1978  . CATARACT EXTRACTION W/ INTRAOCULAR LENS IMPLANT Right 2014 approx.  . CYSTOSCOPY W/ URETERAL STENT PLACEMENT Bilateral 05/07/2017   Procedure: CYSTOSCOPY WITH RETROGRADE PYELOGRAM/URETERAL;  Surgeon: Cleon Gustin, MD;  Location: [facility];  Service: Urology;  Laterality: Bilateral;  . EP IMPLANTABLE DEVICE N/A 08/18/2016  Procedure: Loop Recorder Insertion;  Surgeon: Thompson Grayer, MD;  Location: [facility];  Service: Cardiovascular;  Laterality: N/A;  . REMOVAL NODULES BILATERAL FLANK  2008 approx.   per pt benign fatty tissue  . REPAIR OF PERFORATED ULCER  01/1978   bleeding peptic ulcer  . TEE WITHOUT CARDIOVERSION N/A 08/18/2016   Procedure: TRANSESOPHAGEAL ECHOCARDIOGRAM (TEE);  Surgeon: Sueanne Margarita, MD;  Location: [facility] ENDOSCOPY;  Service: Cardiovascular;  Laterality: N/A; no evidence thrombus;  mobile atrial septum w/ positive bubble study consistent with PFO/  trivial AR, mild TR  . TRANSTHORACIC ECHOCARDIOGRAM  04/09/2015   ef 26-71%, grade 1 diastolic dysfunction/  mild AV sclerosis without stenosis/  mild TR,  trivial MR and PR  . TRANSURETHRAL RESECTION OF BLADDER TUMOR N/A 05/07/2017   Procedure: TRANSURETHRAL RESECTION OF BLADDER TUMOR (TURBT);  Surgeon: Cleon Gustin, MD;  Location: [facility];  Service: Urology;  Laterality: N/A;  . TRANSURETHRAL RESECTION OF BLADDER TUMOR N/A 06/18/2017   Procedure: TRANSURETHRAL RESECTION OF BLADDER TUMOR (TURBT);  Surgeon: Cleon Gustin, MD;  Location: [facility];  Service: Urology;  Laterality: N/A;  . TRANSURETHRAL RESECTION OF BLADDER TUMOR N/A 09/11/2018   Procedure: TRANSURETHRAL RESECTION OF BLADDER TUMOR (TURBT);  Surgeon: Cleon Gustin, MD;  Location: [facility];  Service: Urology;  Laterality: N/A;  . TRANSURETHRAL RESECTION OF PROSTATE  08-20-2013   at [facility] Medications    Prior to Admission medications   Medication Sig Start Date End Date Taking? Authorizing Provider  atorvastatin (LIPITOR) 40 MG tablet Take 40 mg by mouth every evening.    [provider]  Carbidopa-Levodopa ER (RYTARY) 36.25-145 MG CPCR Take 3 capsules by mouth 4 (four) times daily.     [provider]  clopidogrel (PLAVIX) 75 MG tablet Take 1 tablet (75 mg total) by mouth daily. Hold on to follow-up with urology service. 10/12/18   Barton Dubois, MD  furosemide (LASIX) 20 MG tablet Take 1 tablet (20 mg total) by mouth as needed. Patient taking differently: Take 20 mg by mouth daily.  08/20/18 11/18/18  Arnoldo Lenis, MD  HYDROcodone-acetaminophen (NORCO/VICODIN) 5-325 MG tablet Take 1-2 tablets by mouth every 4 (four) hours as needed for moderate pain. 09/11/18   McKenzie, Candee Furbish, MD  meclizine (ANTIVERT) 25 MG tablet Take 25 mg by mouth 3 (three) times daily as needed for dizziness.     [provider]  metoprolol succinate (TOPROL-XL) 50 MG 24 hr  tablet Take 1 tablet (50 mg total) by mouth every evening. Take with or immediately following a meal. 10/12/18   Barton Dubois, MD  NUEDEXTA 20-10 MG CAPS Take 1 capsule by mouth 2 (two) times daily. 06/02/18   [provider]    Family History Family History  Problem Relation Age of Onset  . Stroke Sister     Social History Social History   Tobacco Use  . Smoking status: Never Smoker  . Smokeless tobacco: Never Used  Substance Use Topics  . Alcohol use: No  . Drug use: No     Allergies   Benazepril; Doxycycline; Levofloxacin; Simvastatin; Tamsulosin; Tramadol hcl; and Sulfa antibiotics   Review of Systems Review of Systems  Unable to perform ROS: Mental status change     Physical Exam Updated Vital Signs Ht 1.829 m (6')   Wt 81.6 kg   BMI 24.41 kg/m   Physical Exam Vitals signs and nursing note reviewed.  Constitutional:      General: He is in acute distress.     Appearance: He is well-developed. He is ill-appearing.  HENT:     Head: Normocephalic and atraumatic.     Mouth/Throat:     Pharynx: No oropharyngeal exudate.  Eyes:     General: No scleral icterus.       Right eye: No discharge.        Left eye: No discharge.     Conjunctiva/sclera: Conjunctivae normal.     Pupils: Pupils are equal, round, and reactive to light.  Neck:     Musculoskeletal: Normal range of motion and neck supple.     Thyroid: No thyromegaly.     Vascular: No JVD.  Cardiovascular:     Rate and Rhythm: Normal rate and regular rhythm.     Heart sounds: Normal heart sounds. No murmur. No friction rub. No gallop.   Pulmonary:     Effort: Pulmonary effort is normal. No respiratory distress.     Breath sounds: Normal breath sounds. No wheezing or rales.  Abdominal:     General: Bowel sounds are normal. There is no distension.     Palpations: Abdomen is soft. There is no mass.     Tenderness: There is abdominal tenderness ( Mild diffuse tenderness, soft, no peritoneal  signs).  Musculoskeletal: Normal range of motion.        General: No tenderness, deformity or signs of injury.     Right lower leg: No edema.     Left lower leg: Edema present.  Lymphadenopathy:     Cervical: No cervical adenopathy.  Skin:    General: Skin is warm and dry.     Findings: No erythema or rash.  Neurological:     Mental Status: He is alert.     Coordination: Coordination normal.     Comments: The patient keeps his eyes closed, he will open them, his speech is slow and slightly slurred but is able to answer questions.  He is moving all 4 extremities and appears to have normal grips though he moves very slowly  Psychiatric:        Behavior: Behavior normal.      ED Treatments / Results  Labs (all labs ordered are listed, but only abnormal results are displayed) Labs Reviewed  CBG MONITORING, ED - Abnormal; Notable for the following components:      Result Value   Glucose-Capillary 140 (*)    All other components within normal limits  CULTURE, BLOOD (ROUTINE X 2)  CULTURE, BLOOD (ROUTINE X 2)  URINE CULTURE  LACTIC ACID, PLASMA  LACTIC ACID, PLASMA  COMPREHENSIVE METABOLIC PANEL  CBC WITH DIFFERENTIAL/PLATELET  URINALYSIS, ROUTINE W REFLEX MICROSCOPIC    EKG None  Radiology No results found.  Procedures .Lumbar Puncture Date/Time: 01/12/2019 3:00 PM Performed by: Noemi Chapel, MD Authorized by: Noemi Chapel, MD   Consent:    Consent obtained:  Written   Consent given by:  Spouse   Risks discussed:  Bleeding, infection, pain, repeat procedure, nerve damage and headache   Alternatives discussed:  No treatment Pre-procedure details:    Procedure purpose:  Diagnostic   Preparation: Patient was prepped and draped in usual sterile fashion   Anesthesia (see MAR for exact dosages):    Anesthesia method:  Local infiltration   Local anesthetic:  Lidocaine 1% w/o epi Procedure details:    Lumbar space:  L3-L4 interspace   Patient position:  L lateral  decubitus   Needle  gauge:  22   Needle type:  Diamond point   Needle length (in):  3.5   Ultrasound guidance: no     Number of attempts:  1   Opening pressure (cm H2O):  15   Closing pressure (cm H2O):  12   Fluid appearance:  Clear   Tubes of fluid:  4   Total volume (ml):  5 Post-procedure:    Puncture site:  Adhesive bandage applied   Patient tolerance of procedure:  Tolerated well, no immediate complications  .Critical Care Performed by: Noemi Chapel, MD Authorized by: Noemi Chapel, MD   Critical care provider statement:    Critical care time (minutes):  35   Critical care time was exclusive of:  Separately billable procedures and treating other patients and teaching time   Critical care was necessary to treat or prevent imminent or life-threatening deterioration of the following conditions:  Sepsis   Critical care was time spent personally by me on the following activities:  Blood draw for specimens, development of treatment plan with patient or surrogate, discussions with consultants, evaluation of patient's response to treatment, examination of patient, obtaining history from patient or surrogate, ordering and performing treatments and interventions, ordering and review of laboratory studies, ordering and review of radiographic studies, pulse oximetry, re-evaluation of patient's condition and review of old charts   (including critical care time)  Medications Ordered in ED Medications  0.9 %  sodium chloride infusion (has no administration in time range)  cefTRIAXone (ROCEPHIN) 1 g in sodium chloride 0.9 % 100 mL IVPB (has no administration in time range)     Initial Impression / Assessment and Plan / ED Course  I have reviewed the triage vital signs and the nursing notes.  Pertinent labs & imaging results that were available during my care of the patient were reviewed by me and considered in my medical decision making (see chart for details).  Clinical Course as of Jan 12 1547  Sun Jan 12, 2019  1320 White blood cell count of 14,400, Tylenol ordered   [BM]  1546 The patient's temperature has improved to 100.8, his CSF is pending, I discussed his case with the hospitalist who will come to see the patient for admission.  The patient is being treated for sepsis likely from a urinary source but does not appear to be in shock and his repeat lactic acid is improved to 1.7.   [BM]    Clinical Course User Index [BM] Noemi Chapel, MD    The patient does not appear to have a stiff neck, he has multiple sources of potential infection, recently treated for pneumonia within the last 6 weeks according to his spouse who is here providing more information.  He has recently seen a neurologist and been started on a new medication for Parkinson's disease.  She will bring those medications in.  He has a fever of 102 but is not tachycardic or hypotensive or hypoxic.  Will check flu as well as rule out urinary tract infection, if no sources are found may need a lumbar puncture.  Code sepsis initiated, pt likely needs admission.  CT head to r/o ICH / mass / source of fever  Final Clinical Impressions(s) / ED Diagnoses   Final diagnoses:  Sepsis without acute organ dysfunction, due to unspecified organism [facility]  Urinary tract infection with hematuria, site unspecified  Bad headache      Noemi Chapel, MD 01/12/19 [phone]

## 2019-01-12 NOTE — ED Notes (Signed)
Wife reports pt fell this morning and c/o r shoulder and bilateral knee pain.  Notified EDP.

## 2019-01-12 NOTE — ED Notes (Signed)
Stat gram stain results called to Dr. Cruzita Lederer.

## 2019-01-12 NOTE — ED Notes (Signed)
ED Provider at bedside. 

## 2019-01-12 NOTE — H&P (Signed)
History and Physical    Mike Nixon EXN:[id number] DOB: 1941-06-21 DOA: 01/12/2019  I have briefly reviewed the patient's prior medical records in [place]  PCP: [name], MD  Patient coming from: home  Chief Complaint: AMS, headache  HPI: Mike Nixon is a 78 y.o. 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  Patient has also been complaining of a headache for the last day or 2.  He also tells me that he has been having generalized abdominal pain since Friday.  He denies any nausea or vomiting, and family states that he has been eating without difficulties, but did have a poor appetite over the last couple of days.  He denies any diarrhea.  He denies any neck pain but does complain of headache.  Denies any photophobia.  He denies any chest pain or shortness of breath.  Wife also tells me that he has been complaining of difficulties urinating since last night, feels the urge to go but cannot.  Family also reports that he has been diagnosed and treated for pneumonia about 6 weeks ago.  He denies any chest congestion or cough.  He denies any flulike illness.  ED Course: In the ED he was found to be febrile to 102, due to headache patient underwent an LP and he was given meningitis antibiotics, showing a glucose of 87, normal protein and 1 WBC. His blood work shows mildly elevated creatinine of 1.3, glucose 147, initial lactic acid was 2.3 and improved to 1.7 after fluids, he has a white count of 14.4.  Influenza was checked and was negative.  Urinalysis with rare bacteria and 11-20 WBC.  Chest x-ray stable without acute cardiopulmonary disease, CT scan of the head without any acute findings.  We are asked to admit for SIRS with unclear source.  Review of  Systems: As per HPI otherwise 10 point review of systems negative.   Past Medical History:  Diagnosis Date  . Anticoagulant long-term use    PLAVIX  . Benign localized prostatic hyperplasia with lower urinary tract symptoms (LUTS)   . Bladder tumor   . Borderline diabetes    watches diet  . ED (erectile dysfunction) of organic origin   . Gait disturbance, post-stroke    uses wheelchair long distance  . Glaucoma, right eye   . History of bleeding peptic ulcer    03/ 1979  s/p  surgery  . History of concussion    1987 no loc-- no residual  . History of CVA with residual deficit    05/ 2016 CYPTOGENIC CVA --- RESIDUAL GAIT DISTURBANCE AND SIALORRHEA ACCESS (DROOLING)  . Hyperlipidemia   . Hypertension   . Mild atherosclerosis of both carotid arteries    per duplex from outside facitlity 04-08-2015 less then 50% stenosis bilateral ICA  . Parkinson's disease [facility]) neurologist -- dr r. Mitzi Hansen falconer ([facility])   dx 2015  . PBA (pseudobulbar affect)    laughter-- residual from cva 05/ 2016  . PFO (patent foramen ovale)    per TEE 08-18-2016  . Speech impairment    post-stroke  . Status post placement of implantable loop recorder 08/18/2016   placed by dr allred  . Wears glasses     Past Surgical History:  Procedure Laterality Date  . APPENDECTOMY  1978  . CATARACT EXTRACTION W/ INTRAOCULAR LENS IMPLANT Right 2014 approx.  . CYSTOSCOPY W/ URETERAL STENT PLACEMENT Bilateral 05/07/2017   Procedure: CYSTOSCOPY WITH RETROGRADE PYELOGRAM/URETERAL;  Surgeon: Cleon Gustin, MD;  Location: [facility];  Service: Urology;  Laterality: Bilateral;  . EP IMPLANTABLE DEVICE N/A 08/18/2016   Procedure: Loop Recorder Insertion;  Surgeon: Thompson Grayer, MD;  Location: [place] [facility];  Service: Cardiovascular;  Laterality: N/A;  . REMOVAL NODULES BILATERAL FLANK  2008 approx.   per pt benign fatty tissue  . REPAIR OF PERFORATED ULCER   01/1978   bleeding peptic ulcer  . TEE WITHOUT CARDIOVERSION N/A 08/18/2016   Procedure: TRANSESOPHAGEAL ECHOCARDIOGRAM (TEE);  Surgeon: Sueanne Margarita, MD;  Location[facility] ENDOSCOPY;  Service: Cardiovascular;  Laterality: N/A; no evidence thrombus;  mobile atrial septum w/ positive bubble study consistent with PFO/ trivial AR, mild TR  . TRANSTHORACIC ECHOCARDIOGRAM  04/09/2015   ef 63-78%, grade 1 diastolic dysfunction/  mild AV sclerosis without stenosis/  mild TR,  trivial MR and PR  . TRANSURETHRAL RESECTION OF BLADDER TUMOR N/A 05/07/2017   Procedure: TRANSURETHRAL RESECTION OF BLADDER TUMOR (TURBT);  Surgeon: Cleon Gustin, MD;  Location: [facility];  Service: Urology;  Laterality: N/A;  . TRANSURETHRAL RESECTION OF BLADDER TUMOR N/A 06/18/2017   Procedure: TRANSURETHRAL RESECTION OF BLADDER TUMOR (TURBT);  Surgeon: Cleon Gustin, MD;  Location: [facility];  Service: Urology;  Laterality: N/A;  . TRANSURETHRAL RESECTION OF BLADDER TUMOR N/A 09/11/2018   Procedure: TRANSURETHRAL RESECTION OF BLADDER TUMOR (TURBT);  Surgeon: Cleon Gustin, MD;  Location: [facility];  Service: Urology;  Laterality: N/A;  . TRANSURETHRAL RESECTION OF PROSTATE  08-20-2013   at [place]     reports that he has never smoked. He has never used smokeless tobacco. He reports that he does not drink alcohol or use drugs.  Allergies  Allergen Reactions  . Benazepril Cough  . Doxycycline Other (See Comments)    Unknown  . Levofloxacin Hives and Swelling  . Simvastatin Other (See Comments)    Pt does not remember what the reaction was. ?muscle aches  . Tamsulosin Hives and Swelling  . Tramadol Hcl Hives and Swelling  . Sulfa Antibiotics Hives, Swelling, Rash and Other (See Comments)    Severe rash    Family History  Problem Relation Age of Onset  . Stroke Sister     Prior to Admission medications   Medication Sig Start Date End Date Taking? Authorizing Provider  Istradefylline 20 MG  TABS Take 1 tablet by mouth daily. 01/02/19  Yes [provider]  atorvastatin (LIPITOR) 40 MG tablet Take 40 mg by mouth every evening.    [provider]  Carbidopa-Levodopa ER (RYTARY) 36.25-145 MG CPCR Take 3 capsules by mouth 4 (four) times daily.     [provider]  clopidogrel (PLAVIX) 75 MG tablet Take 1 tablet (75 mg total) by mouth daily. Hold on to follow-up with urology service. 10/12/18   Barton Dubois, MD  finasteride (PROSCAR) 5 MG tablet Take 5 mg by mouth daily. 11/04/18   [provider]  furosemide (LASIX) 20 MG tablet Take 1 tablet (20 mg total) by mouth as needed. Patient taking differently: Take 20 mg by mouth daily.  08/20/18 11/18/18  Arnoldo Lenis, MD  HYDROcodone-acetaminophen (NORCO/VICODIN) 5-325 MG tablet Take 1-2 tablets by mouth every 4 (four) hours as needed for moderate pain. 09/11/18   McKenzie, Candee Furbish, MD  meclizine Johnathan Hausen)  25 MG tablet Take 25 mg by mouth 3 (three) times daily as needed for dizziness.     [provider]  metoprolol succinate (TOPROL-XL) 50 MG 24 hr tablet Take 1 tablet (50 mg total) by mouth every evening. Take with or immediately following a meal. 10/12/18   Barton Dubois, MD  NUEDEXTA 20-10 MG CAPS Take 1 capsule by mouth 2 (two) times daily. 06/02/18   [provider]    Physical Exam: Vitals:   01/12/19 1430 01/12/19 1500 01/12/19 1502 01/12/19 1530  BP: (!) 143/82 (!) 148/84  139/72  Pulse: 77 75  76  Resp: (!) 25 (!) 26  18  Temp:   (!) 100.8 F (38.2 C)   TempSrc:   Rectal   SpO2: 97% 97%  97%  Weight:      Height:        Constitutional: NAD, calm, comfortable Eyes: PERRL, lids and conjunctivae normal, no scleral icterus noted ENMT: Mucous membranes are moist. Posterior pharynx clear of any exudate or lesions. Neck: normal, supple, no masses, no thyromegaly Respiratory: clear to auscultation bilaterally, no wheezing, no crackles. Normal respiratory effort.    Cardiovascular: Regular rate and rhythm, no murmurs / rubs / gallops. No extremity edema. 2+ pedal pulses.  Abdomen: Diffuse tenderness all 4 quadrants, no guarding or rebound, bowel sounds positive.  Patient grimaces with palpation Musculoskeletal: no clubbing / cyanosis. Normal muscle tone.  Skin: no rashes, lesions, ulcers. No induration Neurologic: CN 2-12 grossly intact.  Residual right-sided weakness Psychiatric: Normal judgment and insight. Alert and oriented to place, time, situation  Labs on Admission: I have personally reviewed following labs and imaging studies  CBC: Recent Labs  Lab 01/12/19 1242  WBC 14.4*  NEUTROABS 11.8*  HGB 13.7  HCT 46.4  MCV 83.2  PLT 782   Basic Metabolic Panel: Recent Labs  Lab 01/12/19 1242  NA 138  K 3.5  CL 103  CO2 25  GLUCOSE 147*  BUN 15  CREATININE 1.31*  CALCIUM 8.8*   GFR: Estimated Creatinine Clearance: 52.7 mL/min (A) (by C-G formula based on SCr of 1.31 mg/dL (H)). Liver Function Tests: Recent Labs  Lab 01/12/19 1242  AST 18  ALT 22  ALKPHOS 72  BILITOT 3.6*  PROT 7.2  ALBUMIN 3.8   No results for input(s): LIPASE, AMYLASE in the last 168 hours. No results for input(s): AMMONIA in the last 168 hours. Coagulation Profile: No results for input(s): INR, PROTIME in the last 168 hours. Cardiac Enzymes: No results for input(s): CKTOTAL, CKMB, CKMBINDEX, TROPONINI in the last 168 hours. BNP (last 3 results) No results for input(s): PROBNP in the last 8760 hours. HbA1C: No results for input(s): HGBA1C in the last 72 hours. CBG: Recent Labs  Lab 01/12/19 1218  GLUCAP 140*   Lipid Profile: No results for input(s): CHOL, HDL, LDLCALC, TRIG, CHOLHDL, LDLDIRECT in the last 72 hours. Thyroid Function Tests: No results for input(s): TSH, T4TOTAL, FREET4, T3FREE, THYROIDAB in the last 72 hours. Anemia Panel: No results for input(s): VITAMINB12, FOLATE, FERRITIN, TIBC, IRON, RETICCTPCT in the last 72 hours. Urine  analysis:    Component Value Date/Time   COLORURINE AMBER (A) 01/12/2019 1235   APPEARANCEUR HAZY (A) 01/12/2019 1235   LABSPEC 1.026 01/12/2019 1235   PHURINE 5.0 01/12/2019 1235   GLUCOSEU NEGATIVE 01/12/2019 1235   HGBUR SMALL (A) 01/12/2019 1235   BILIRUBINUR NEGATIVE 01/12/2019 1235   KETONESUR 5 (A) 01/12/2019 1235   PROTEINUR 30 (A) 01/12/2019 1235  NITRITE NEGATIVE 01/12/2019 [place] (A) 01/12/2019 1235     Radiological Exams on Admission: Ct Head Wo Contrast  Result Date: 01/12/2019 CLINICAL DATA:  Bad headache for 3 days. EXAM: CT HEAD WITHOUT CONTRAST TECHNIQUE: Contiguous axial images were obtained from the base of the skull through the vertex without intravenous contrast. COMPARISON:  July 09, 2016 FINDINGS: Brain: No subdural, epidural, or subarachnoid hemorrhage. Ventricles and sulci are normal. Cerebellum demonstrates no acute abnormalities. Brainstem and basal cisterns are normal. No mass effect or midline shift. No acute cortical ischemia or infarct. Mild white matter changes. Vascular: No hyperdense vessel or unexpected calcification. Skull: Normal. Negative for fracture or focal lesion. Sinuses/Orbits: No acute finding. Other: None. IMPRESSION: 1. No acute intracranial abnormalities to explain the patient's headache. Electronically Signed   By: Dorise Bullion III M.D   On: 01/12/2019 15:03   Dg Chest Port 1 View  Result Date: 01/12/2019 CLINICAL DATA:  Had not acute fever and acute mental status changes. Current history of bladder cancer. EXAM: PORTABLE CHEST 1 VIEW COMPARISON:  11/25/2018 and earlier. FINDINGS: Suboptimal inspiration accounts for crowded bronchovascular markings, especially in the bases, and accentuates the cardiac silhouette. Taking this into account, cardiac silhouette mildly enlarged, unchanged. Pulmonary venous hypertension without overt edema. Lungs clear. No visible pleural effusions. Implantable cardiac recording device  overlying the LEFT side of the chest. IMPRESSION: Suboptimal inspiration. Stable mild cardiomegaly. No acute cardiopulmonary disease. Electronically Signed   By: Evangeline Dakin M.D.   On: 01/12/2019 13:53    EKG: Independently reviewed.  Sinus rhythm  Assessment/Plan Active Problems:   Sepsis ([place])   Principal Problem Sepsis due to UTI versus intra-abdominal source -Urinalysis shows some evidence of infection however not very conclusive.  Blood cultures and urine cultures were obtained.  He was given meningitis coverage antibiotics but LP results do not suggest bacterial meningitis. -Obtain stat CT scan of the abdomen and pelvis, he has mildly elevated bilirubin but AST and ALT are normal.  Placed on vancomycin and Zosyn  Active Problems Prior CVA -With residual right-sided weakness, resume Plavix  Hypertension -He is normotensive in the ED, resume home medications tomorrow  Prediabetes -Most recent hemoglobin A1c 6.4, monitor CBGs with morning labs and if increased placed on sliding scale  Chronic lower extremity swelling -Hold Lasix  Parkinson's disease -Continue home medications  Hyperlipidemia -continue statin  Recurrent falls at home -Obtain PT consult   DVT prophylaxis: SCDs  Code Status: Full code  Family Communication: wife and son at bedside  Disposition Plan: home when ready  Consults called: none     Marzetta Board, MD, PhD [facility]  Contact via [email or web address]  [facility] Office Info P: [phone]  F: [phone]   01/12/2019, 4:02 PM

## 2019-01-13 ENCOUNTER — Other Ambulatory Visit: Payer: Self-pay

## 2019-01-13 ENCOUNTER — Encounter (HOSPITAL_COMMUNITY): Payer: Self-pay

## 2019-01-13 ENCOUNTER — Encounter (INDEPENDENT_AMBULATORY_CARE_PROVIDER_SITE_OTHER): Payer: Self-pay | Admitting: Neurology

## 2019-01-13 DIAGNOSIS — N529 Male erectile dysfunction, unspecified: Secondary | ICD-10-CM | POA: Diagnosis present

## 2019-01-13 DIAGNOSIS — D494 Neoplasm of unspecified behavior of bladder: Secondary | ICD-10-CM | POA: Diagnosis present

## 2019-01-13 DIAGNOSIS — Q211 Atrial septal defect: Secondary | ICD-10-CM | POA: Diagnosis not present

## 2019-01-13 DIAGNOSIS — N400 Enlarged prostate without lower urinary tract symptoms: Secondary | ICD-10-CM

## 2019-01-13 DIAGNOSIS — Z961 Presence of intraocular lens: Secondary | ICD-10-CM | POA: Diagnosis present

## 2019-01-13 DIAGNOSIS — I6523 Occlusion and stenosis of bilateral carotid arteries: Secondary | ICD-10-CM | POA: Diagnosis present

## 2019-01-13 DIAGNOSIS — E785 Hyperlipidemia, unspecified: Secondary | ICD-10-CM | POA: Diagnosis present

## 2019-01-13 DIAGNOSIS — R17 Unspecified jaundice: Secondary | ICD-10-CM | POA: Diagnosis present

## 2019-01-13 DIAGNOSIS — H409 Unspecified glaucoma: Secondary | ICD-10-CM | POA: Diagnosis present

## 2019-01-13 DIAGNOSIS — Z8711 Personal history of peptic ulcer disease: Secondary | ICD-10-CM | POA: Diagnosis not present

## 2019-01-13 DIAGNOSIS — R51 Headache: Secondary | ICD-10-CM | POA: Diagnosis present

## 2019-01-13 DIAGNOSIS — G20 Parkinson's disease: Secondary | ICD-10-CM | POA: Diagnosis present

## 2019-01-13 DIAGNOSIS — I10 Essential (primary) hypertension: Secondary | ICD-10-CM | POA: Diagnosis present

## 2019-01-13 DIAGNOSIS — R1312 Dysphagia, oropharyngeal phase: Secondary | ICD-10-CM | POA: Diagnosis present

## 2019-01-13 DIAGNOSIS — N390 Urinary tract infection, site not specified: Secondary | ICD-10-CM | POA: Diagnosis present

## 2019-01-13 DIAGNOSIS — N138 Other obstructive and reflux uropathy: Secondary | ICD-10-CM | POA: Diagnosis present

## 2019-01-13 DIAGNOSIS — M7989 Other specified soft tissue disorders: Secondary | ICD-10-CM | POA: Diagnosis present

## 2019-01-13 DIAGNOSIS — Z9841 Cataract extraction status, right eye: Secondary | ICD-10-CM | POA: Diagnosis not present

## 2019-01-13 DIAGNOSIS — I69351 Hemiplegia and hemiparesis following cerebral infarction affecting right dominant side: Secondary | ICD-10-CM | POA: Diagnosis not present

## 2019-01-13 DIAGNOSIS — A419 Sepsis, unspecified organism: Secondary | ICD-10-CM | POA: Diagnosis present

## 2019-01-13 DIAGNOSIS — Z8551 Personal history of malignant neoplasm of bladder: Secondary | ICD-10-CM | POA: Diagnosis not present

## 2019-01-13 DIAGNOSIS — R7303 Prediabetes: Secondary | ICD-10-CM | POA: Diagnosis present

## 2019-01-13 DIAGNOSIS — R296 Repeated falls: Secondary | ICD-10-CM | POA: Diagnosis present

## 2019-01-13 DIAGNOSIS — B965 Pseudomonas (aeruginosa) (mallei) (pseudomallei) as the cause of diseases classified elsewhere: Secondary | ICD-10-CM | POA: Diagnosis present

## 2019-01-13 DIAGNOSIS — R1030 Lower abdominal pain, unspecified: Secondary | ICD-10-CM | POA: Diagnosis present

## 2019-01-13 DIAGNOSIS — R319 Hematuria, unspecified: Secondary | ICD-10-CM | POA: Diagnosis present

## 2019-01-13 LAB — COMPREHENSIVE METABOLIC PANEL
ALT: 16 U/L (ref 0–44)
AST: 13 U/L — ABNORMAL LOW (ref 15–41)
Albumin: 2.8 g/dL — ABNORMAL LOW (ref 3.5–5.0)
Alkaline Phosphatase: 55 U/L (ref 38–126)
Anion gap: 9 (ref 5–15)
BUN: 14 mg/dL (ref 8–23)
CO2: 23 mmol/L (ref 22–32)
Calcium: 8.2 mg/dL — ABNORMAL LOW (ref 8.9–10.3)
Chloride: 103 mmol/L (ref 98–111)
Creatinine, Ser: 1.27 mg/dL — ABNORMAL HIGH (ref 0.61–1.24)
GFR calc Af Amer: >60 mL/min (ref >60)
GFR calc non Af Amer: 55 mL/min — ABNORMAL LOW (ref >60)
Glucose, Bld: 137 mg/dL — ABNORMAL HIGH (ref 70–99)
Potassium: 3.4 mmol/L — ABNORMAL LOW (ref 3.5–5.1)
Sodium: 135 mmol/L (ref 135–145)
Total Bilirubin: 2.3 mg/dL — ABNORMAL HIGH (ref 0.3–1.2)
Total Protein: 5.7 g/dL — ABNORMAL LOW (ref 6.5–8.1)

## 2019-01-13 LAB — CSF CELL COUNT WITH DIFFERENTIAL
RBC Count, CSF: 1 /mm3 — ABNORMAL HIGH
Tube #: 1
WBC, CSF: 1 /mm3 (ref 0–5)

## 2019-01-13 LAB — CBC
HCT: 38.6 % — ABNORMAL LOW (ref 39.0–52.0)
Hemoglobin: 11.9 g/dL — ABNORMAL LOW (ref 13.0–17.0)
MCH: 25.5 pg — ABNORMAL LOW (ref 26.0–34.0)
MCHC: 30.8 g/dL (ref 30.0–36.0)
MCV: 82.8 fL (ref 80.0–100.0)
Platelets: 158 10*3/uL (ref 150–400)
RBC: 4.66 MIL/uL (ref 4.22–5.81)
RDW: 13.3 % (ref 11.5–15.5)
WBC: 11.1 10*3/uL — ABNORMAL HIGH (ref 4.0–10.5)
nRBC: 0 % (ref 0.0–0.2)

## 2019-01-13 LAB — CRYPTOCOCCAL ANTIGEN, CSF: Crypto Ag: NEGATIVE

## 2019-01-13 LAB — VDRL, CSF: VDRL Quant, CSF: NONREACTIVE

## 2019-01-13 MED ORDER — OXYBUTYNIN CHLORIDE 5 MG PO TABS
5.0000 mg | ORAL_TABLET | Freq: Three times a day (TID) | ORAL | Status: DC | PRN
  Administered 2019-01-13 (×2): 5 mg via ORAL
  Filled 2019-01-13 (×4): qty 1

## 2019-01-13 NOTE — ED Notes (Signed)
Social Work at bedside 

## 2019-01-13 NOTE — Evaluation (Signed)
Physical Therapy Evaluation Patient Details Name: Mike Nixon MRN: [id number] DOB: 1941/07/03 Today's Date: 01/13/2019   History of Present Illness  Marsden Zaino is a 78 y/o male with slightly increased confusion, fall and c/o headache, PMHx: CVA with residual right-sided weakness, parkinson's disease, HTN, hyperlipidemia    Clinical Impression  Patient demonstrates slow labored movement for sitting up at bedside due to pain in stomach and insertion site of foley catheter, at risk for falls due to poor standing balance and limited for ambulation due to fatigue and generalized weakness.  Patient on room air during ambulation with O2 saturation at 90-91%, increased above 94% while at rest - RN notified.  Patient will benefit from continued physical therapy in hospital and recommended venue below to increase strength, balance, endurance for safe ADLs and gait.    Follow Up Recommendations SNF;Supervision/Assistance - 24 hour;Supervision for mobility/OOB    Equipment Recommendations  None recommended by PT    Recommendations for Other Services       Precautions / Restrictions Precautions Precautions: Fall Restrictions Weight Bearing Restrictions: No      Mobility  Bed Mobility Overal bed mobility: Needs Assistance Bed Mobility: Supine to Sit;Sit to Supine     Supine to sit: Mod assist Sit to supine: Min assist   General bed mobility comments: slow labored movement  Transfers Overall transfer level: Needs assistance Equipment used: Rolling walker (2 wheeled) Transfers: Sit to/from [facility] Sit to Stand: Min assist;Mod assist Stand pivot transfers: Mod assist       General transfer comment: slow labored movement  Ambulation/Gait Ambulation/Gait assistance: Mod assist Gait Distance (Feet): 20 Feet Assistive device: Rolling walker (2 wheeled) Gait Pattern/deviations: Decreased step length - right;Decreased step length - left;Decreased stride  length;Ataxic;Leaning posteriorly Gait velocity: slow   General Gait Details: slow labored cadence with ataxic like movement of BLE, limited secondary to fatigue  Stairs            Wheelchair Mobility    Modified Rankin (Stroke Patients Only)       Balance Overall balance assessment: Needs assistance Sitting-balance support: Bilateral upper extremity supported;Feet supported Sitting balance-Leahy Scale: Fair     Standing balance support: Bilateral upper extremity supported;During functional activity Standing balance-Leahy Scale: Fair Standing balance comment: using RW                             Pertinent Vitals/Pain Pain Assessment: 0-10 Pain Score: 9  Pain Location: stomach and at site of foley catheter insertion Pain Descriptors / Indicators: Burning;Grimacing;Guarding;Moaning Pain Intervention(s): Limited activity within patient's tolerance;Monitored during session    [place] expects to be discharged to:: Private residence Living Arrangements: Spouse/significant other   Type of Home: House Home Access: Stairs to enter   Technical brewer of Steps: 1 Home Layout: One level Home Equipment: Environmental consultant - 2 wheels;Bedside commode;Shower seat;Wheelchair - manual      Prior Function Level of Independence: Needs assistance   Gait / Transfers Assistance Needed: household ambulator with RW  ADL's / Homemaking Assistance Needed: home aide 6 hours/day x 5 days/week        Hand Dominance        Extremity/Trunk Assessment   Upper Extremity Assessment Upper Extremity Assessment: Generalized weakness    Lower Extremity Assessment Lower Extremity Assessment: Generalized weakness    Cervical / Trunk Assessment Cervical / Trunk Assessment: Normal  Communication      Cognition Arousal/Alertness: Awake/alert Behavior During  Therapy: WFL for tasks assessed/performed Overall Cognitive Status: Within Functional Limits for tasks  assessed                                        General Comments      Exercises     Assessment/Plan    PT Assessment Patient needs continued PT services  PT Problem List Decreased strength;Decreased activity tolerance;Decreased balance;Decreased mobility       PT Treatment Interventions Gait training;Stair training;Functional mobility training;Therapeutic exercise;Patient/family education    PT Goals (Current goals can be found in the Care Plan section)  Acute Rehab PT Goals Patient Stated Goal: return home PT Goal Formulation: With patient Time For Goal Achievement: 01/27/19    Frequency Min 3X/week   Barriers to discharge        Co-evaluation               AM-PAC PT "6 Clicks" Mobility  Outcome Measure Help needed turning from your back to your side while in a flat bed without using bedrails?: A Little Help needed moving from lying on your back to sitting on the side of a flat bed without using bedrails?: A Lot Help needed moving to and from a bed to a chair (including a wheelchair)?: A Lot Help needed standing up from a chair using your arms (e.g., wheelchair or bedside chair)?: A Lot Help needed to walk in hospital room?: A Lot Help needed climbing 3-5 steps with a railing? : Total 6 Click Score: 12    End of Session   Activity Tolerance: Patient tolerated treatment well;Patient limited by fatigue;Patient limited by pain Patient left: in bed;with call bell/phone within reach Nurse Communication: Mobility status PT Visit Diagnosis: Unsteadiness on feet (R26.81);Other abnormalities of gait and mobility (R26.89);History of falling (Z91.81)    Time: 8250-0370 PT Time Calculation (min) (ACUTE ONLY): 35 min   Charges:   PT Evaluation $PT Eval Moderate Complexity: 1 Mod PT Treatments $Therapeutic Activity: 23-37 mins        12:43 PM, 01/13/19 Lonell Grandchild, MPT Physical Therapist with [facility] [phone]  office [phone] mobile phone

## 2019-01-13 NOTE — Plan of Care (Signed)
  Problem: Acute Rehab PT Goals(only PT should resolve) Goal: Pt Will Go Supine/Side To Sit Outcome: Progressing Flowsheets (Taken 01/13/2019 1243) Pt will go Supine/Side to Sit: with min guard assist Goal: Patient Will Transfer Sit To/From Stand Outcome: Progressing Flowsheets (Taken 01/13/2019 1243) Patient will transfer sit to/from stand: with min guard assist Goal: Pt Will Transfer Bed To Chair/Chair To Bed Outcome: Progressing Flowsheets (Taken 01/13/2019 1243) Pt will Transfer Bed to Chair/Chair to Bed: min guard assist Goal: Pt Will Ambulate Outcome: Progressing Flowsheets (Taken 01/13/2019 1243) Pt will Ambulate: 50 feet; with minimal assist; with rolling walker   12:44 PM, 01/13/19 Lonell Grandchild, MPT Physical Therapist with [facility] [phone] office [phone] mobile phone

## 2019-01-13 NOTE — Care Management Obs Status (Signed)
[facility] NOTIFICATION   Patient Details  Name: Mike Nixon MRN: [id number] Date of Birth: [birth year]   Medicare Observation Status Notification Given:  Yes    Sherald Barge, RN 01/13/2019, 11:00 AM

## 2019-01-13 NOTE — ED Notes (Signed)
Have notified pharmacy of Ditropan

## 2019-01-13 NOTE — ED Notes (Signed)
Have notified Dr. Roderic Palau of patient's pain with urination

## 2019-01-13 NOTE — Progress Notes (Signed)
PROGRESS NOTE    Mike Nixon  OYD:[id number] DOB: 02/25/1941 DOA: 01/12/2019 PCP: [name], MD    Brief Narrative:  78 year old male with a history of prior CVA, Parkinson's disease, hypertension, was brought to the hospital with increasing confusion, headache was noted to be febrile in the emergency room.  Lumbar puncture performed did not indicate any CNS infection.  Urinalysis indicated possible urinary infection.  CT scan of the abdomen pelvis indicated thickening around bladder versus prostate.  He has been started on broad-spectrum antibiotics.  We will follow-up urine culture.   Assessment & Plan:   Active Problems:   Parkinson's disease ([place])   Benign essential HTN   HLD (hyperlipidemia)   Sepsis ([facility])   BPH (benign prostatic hyperplasia)   Acute lower UTI   1. Sepsis, possibly related to urinary tract infection.  Urinalysis indicates possible infection.  He did have significant fevers on admission and continued to have low-grade fevers overnight.  He is currently on broad-spectrum antibiotics.  Lumbar puncture performed was not indicative of meningitis.  CT of the abdomen pelvis also indicated possible urinary source.  Continue current treatments. 2. Prior CVA.  He has residual right-sided weakness.  Continue on Plavix. 3. Hypertension.  Blood pressures currently stable. 4. Parkinson's disease.  Continue home medications. 5. Hyperlipidemia.  Continue statin. 6. Recurrent falls at home.  Physical therapy evaluation requested.   DVT prophylaxis: SCDs Code Status: Full code Family Communication: No family present Disposition Plan: Discharge home once fevers have resolved   Consultants:     Procedures:     Antimicrobials:   Vancomycin 2/16 >  Zosyn 2/16 >   Subjective: Continues to complain of some lower abdominal pain.  Continues to have some low-grade temperatures overnight.  Objective: Vitals:   01/13/19 0930 01/13/19 1000 01/13/19 1100 01/13/19  1130  BP: (!) 146/77 (!) 151/81 135/83 131/75  Pulse: 74 69 74 77  Resp: (!) 24 (!) 22 (!) 21 19  Temp:      TempSrc:      SpO2: 97% 97% 95% 95%  Weight:      Height:        Intake/Output Summary (Last 24 hours) at 01/13/2019 1136 Last data filed at 01/13/2019 0536 Gross per 24 hour  Intake 1781.48 ml  Output 650 ml  Net 1131.48 ml   Filed Weights   01/12/19 1221  Weight: 81.6 kg    Examination:  General exam: Appears calm and comfortable  Respiratory system: Clear to auscultation. Respiratory effort normal. Cardiovascular system: S1 & S2 heard, RRR. No JVD, murmurs, rubs, gallops or clicks. No pedal edema. Gastrointestinal system: Abdomen is nondistended, soft and nontender. No organomegaly or masses felt. Normal bowel sounds heard. Central nervous system: Alert and oriented. No focal neurological deficits. Extremities: Symmetric 5 x 5 power. Skin: No rashes, lesions or ulcers Psychiatry: Judgement and insight appear normal. Mood & affect appropriate.     Data Reviewed: I have personally reviewed following labs and imaging studies  CBC: Recent Labs  Lab 01/12/19 1242 01/13/19 0744  WBC 14.4* 11.1*  NEUTROABS 11.8*  --   HGB 13.7 11.9*  HCT 46.4 38.6*  MCV 83.2 82.8  PLT 186 672   Basic Metabolic Panel: Recent Labs  Lab 01/12/19 1242 01/13/19 0744  NA 138 135  K 3.5 3.4*  CL 103 103  CO2 25 23  GLUCOSE 147* 137*  BUN 15 14  CREATININE 1.31* 1.27*  CALCIUM 8.8* 8.2*   GFR: Estimated Creatinine Clearance:  54.3 mL/min (A) (by C-G formula based on SCr of 1.27 mg/dL (H)). Liver Function Tests: Recent Labs  Lab 01/12/19 1242 01/13/19 0744  AST 18 13*  ALT 22 16  ALKPHOS 72 55  BILITOT 3.6* 2.3*  PROT 7.2 5.7*  ALBUMIN 3.8 2.8*   No results for input(s): LIPASE, AMYLASE in the last 168 hours. No results for input(s): AMMONIA in the last 168 hours. Coagulation Profile: No results for input(s): INR, PROTIME in the last 168 hours. Cardiac  Enzymes: No results for input(s): CKTOTAL, CKMB, CKMBINDEX, TROPONINI in the last 168 hours. BNP (last 3 results) No results for input(s): PROBNP in the last 8760 hours. HbA1C: No results for input(s): HGBA1C in the last 72 hours. CBG: Recent Labs  Lab 01/12/19 1218  GLUCAP 140*   Lipid Profile: No results for input(s): CHOL, HDL, LDLCALC, TRIG, CHOLHDL, LDLDIRECT in the last 72 hours. Thyroid Function Tests: No results for input(s): TSH, T4TOTAL, FREET4, T3FREE, THYROIDAB in the last 72 hours. Anemia Panel: No results for input(s): VITAMINB12, FOLATE, FERRITIN, TIBC, IRON, RETICCTPCT in the last 72 hours. Sepsis Labs: Recent Labs  Lab 01/12/19 1258 01/12/19 1441  LATICACIDVEN 2.3* 1.7    Recent Results (from the past 240 hour(s))  Blood Culture (routine x 2)     Status: None (Preliminary result)   Collection Time: 01/12/19 12:42 PM  Result Value Ref Range Status   Specimen Description   Final    RIGHT ANTECUBITAL BOTTLES DRAWN AEROBIC AND ANAEROBIC   Special Requests Blood Culture adequate volume  Final   Culture   Final    NO GROWTH < 24 HOURS Performed at [facility], [address] [place], [place] [address]    Report Status PENDING  Incomplete  Blood Culture (routine x 2)     Status: None (Preliminary result)   Collection Time: 01/12/19 12:42 PM  Result Value Ref Range Status   Specimen Description   Final    BLOOD LEFT HAND BOTTLES DRAWN AEROBIC AND ANAEROBIC   Special Requests   Final    Blood Culture results may not be optimal due to an inadequate volume of blood received in culture bottles   Culture   Final    NO GROWTH < 24 HOURS Performed at [facility], [address], [place], [place] [address]    Report Status PENDING  Incomplete  CSF culture     Status: None (Preliminary result)   Collection Time: 01/12/19  2:40 PM  Result Value Ref Range Status   Specimen Description   Final    CSF Performed at [facility], [address] [place], [place]  [address]    Special Requests   Final    Normal Performed at [facility], [address] [place], [place] [address]    Gram Stain   Final    CYTOSPIN SMEAR NO CELLS OR ORGANISMS SEEN Gram Stain Report Called to,Read Back By and Verified With: WHITE,M. AT 0865 ON 01/12/2019 BY EVA Performed at [facility], [address] [place], [place] [address]    Culture   Final    NO GROWTH < 24 HOURS Performed at [facility] [address], [place],  [address]    Report Status PENDING  Incomplete         Radiology Studies: Dg Shoulder Right  Result Date: 01/12/2019 CLINICAL DATA:  Shoulder and bilateral knee pain after falling today. EXAM: RIGHT SHOULDER - 2+ VIEW COMPARISON:  None. FINDINGS: AP and Y-views are  submitted. There is no axillary view. The mineralization and alignment are normal. There is no evidence of acute fracture or dislocation. The subacromial space is preserved. There are mild acromioclavicular degenerative changes. IMPRESSION: No evidence of acute fracture or dislocation. Electronically Signed   By: Richardean Sale M.D.   On: 01/12/2019 16:16   Ct Head Wo Contrast  Result Date: 01/12/2019 CLINICAL DATA:  Bad headache for 3 days. EXAM: CT HEAD WITHOUT CONTRAST TECHNIQUE: Contiguous axial images were obtained from the base of the skull through the vertex without intravenous contrast. COMPARISON:  July 09, 2016 FINDINGS: Brain: No subdural, epidural, or subarachnoid hemorrhage. Ventricles and sulci are normal. Cerebellum demonstrates no acute abnormalities. Brainstem and basal cisterns are normal. No mass effect or midline shift. No acute cortical ischemia or infarct. Mild white matter changes. Vascular: No hyperdense vessel or unexpected calcification. Skull: Normal. Negative for fracture or focal lesion. Sinuses/Orbits: No acute finding. Other: None. IMPRESSION: 1. No acute intracranial abnormalities to explain the patient's headache. Electronically Signed   By:  Dorise Bullion III M.D   On: 01/12/2019 15:03   Ct Abdomen Pelvis W Contrast  Result Date: 01/12/2019 CLINICAL DATA:  Abdominal pain, headache. Fever, abscess suspected. EXAM: CT ABDOMEN AND PELVIS WITH CONTRAST TECHNIQUE: Multidetector CT imaging of the abdomen and pelvis was performed using the standard protocol following bolus administration of intravenous contrast. CONTRAST:  153mL OMNIPAQUE IOHEXOL 300 MG/ML  SOLN COMPARISON:  CT abdomen dated 10/11/2018. FINDINGS: Lower chest: No acute abnormality. Hepatobiliary: No focal liver abnormality is seen. No gallstones, gallbladder wall thickening, or biliary dilatation. Pancreas: Unremarkable. No pancreatic ductal dilatation or surrounding inflammatory changes. Spleen: Normal in size without focal abnormality. Adrenals/Urinary Tract: Adrenal glands appear normal. Kidneys are unremarkable without suspicious mass, stone or hydronephrosis. No ureteral or bladder calculi appreciated. Bladder is decompressed by Foley catheter. Stomach/Bowel: No dilated large or small bowel loops. No definitive evidence of bowel wall inflammation or mesenteric inflammation. There is new mild inflammation/fluid stranding within the presacral space of the lower pelvis, of uncertain etiology. Vascular/Lymphatic: No significant vascular findings are present. No enlarged abdominal or pelvic lymph nodes. Reproductive: Prostate gland is enlarged. Other: No abscess collection. No free intraperitoneal air. Musculoskeletal: Degenerative spondylosis throughout the thoracolumbar spine, mild to moderate in degree. No acute or suspicious osseous finding. IMPRESSION: 1. New mild inflammation/fluid stranding within the presacral space of the lower pelvis. This is of uncertain etiology but given its proximity to the rectum and bladder could be sequela of either proctocolitis, prostatitis or bladder infection. 2. Prostate gland is enlarged. 3. Bladder decompressed by Foley catheter. 4. Remainder of  the abdomen and pelvis CT is unremarkable, as detailed above. No bowel obstruction. No abscess collection or free intraperitoneal air. No evidence of pyelonephritis. Electronically Signed   By: Franki Cabot M.D.   On: 01/12/2019 17:40   Dg Chest Port 1 View  Result Date: 01/12/2019 CLINICAL DATA:  Had not acute fever and acute mental status changes. Current history of bladder cancer. EXAM: PORTABLE CHEST 1 VIEW COMPARISON:  11/25/2018 and earlier. FINDINGS: Suboptimal inspiration accounts for crowded bronchovascular markings, especially in the bases, and accentuates the cardiac silhouette. Taking this into account, cardiac silhouette mildly enlarged, unchanged. Pulmonary venous hypertension without overt edema. Lungs clear. No visible pleural effusions. Implantable cardiac recording device overlying the LEFT side of the chest. IMPRESSION: Suboptimal inspiration. Stable mild cardiomegaly. No acute cardiopulmonary disease. Electronically Signed   By: Evangeline Dakin M.D.   On: 01/12/2019 13:53  Dg Knee Complete 4 Views Left  Result Date: 01/12/2019 CLINICAL DATA:  Fall this morning.  Bilateral knee pain. EXAM: LEFT KNEE - COMPLETE 4+ VIEW COMPARISON:  Radiographs 04/17/2018. FINDINGS: The mineralization and alignment are normal. There is no evidence of acute fracture or dislocation. The joint spaces are preserved. There is stable spurring at the quadriceps insertion on the patella. Previously noted pretibial soft tissue swelling has resolved. There is no foreign body or significant joint effusion. Femoropopliteal atherosclerosis noted. IMPRESSION: No acute osseous findings.  Diffuse vascular calcifications. Electronically Signed   By: Richardean Sale M.D.   On: 01/12/2019 16:20   Dg Knee Complete 4 Views Right  Result Date: 01/12/2019 CLINICAL DATA:  Fall this morning.  Bilateral knee pain. EXAM: RIGHT KNEE - COMPLETE 4+ VIEW COMPARISON:  None. FINDINGS: The mineralization and alignment are normal.  There is no evidence of acute fracture or dislocation. The joint spaces are preserved. No significant joint effusion. There is mild patellar spurring. There is diffuse femoropopliteal atherosclerosis. IMPRESSION: No acute osseous findings.  Diffuse vascular calcifications. Electronically Signed   By: Richardean Sale M.D.   On: 01/12/2019 16:17        Scheduled Meds: . atorvastatin  40 mg Oral QPM  . Carbidopa-Levodopa ER  3 capsule Oral QID  . clopidogrel  75 mg Oral Daily  . Dextromethorphan-quiNIDine  1 capsule Oral BID  . finasteride  5 mg Oral Daily  . Istradefylline  1 tablet Oral Daily  . lidocaine (PF)  5 mL Intradermal Once  . metoprolol succinate  50 mg Oral QPM   Continuous Infusions: . sodium chloride Stopped (01/12/19 1828)  . sodium chloride 75 mL/hr at 01/13/19 0203  . piperacillin-tazobactam (ZOSYN)  IV 3.375 g (01/13/19 1017)  . vancomycin       LOS: 0 days    Time spent: 41mins    Kathie Dike, MD [facility]   If 7PM-7AM, please contact night-coverage [email or web address]  01/13/2019, 11:36 AM

## 2019-01-13 NOTE — NC FL2 (Signed)
[place] LEVEL OF CARE SCREENING TOOL     IDENTIFICATION  Patient Name: Mike Nixon Birthdate: August 18, 1941 Sex: male Admission Date (Current Location): 01/12/2019  [place] and [place] Number:  [place] [facility] [id number] Facility and Address:  [facility] [address] [place]      Provider Number: [id number]  Attending Physician Name and Address:  Kathie Dike, MD  Relative Name and Phone Number:  Dewight Catino (wife) [phone]    Current Level of Care: Hospital Recommended Level of Care: [place] Prior Approval Number:    Date Approved/Denied:   PASRR Number:    Discharge Plan: SNF    Current Diagnoses: Patient Active Problem List   Diagnosis Date Noted  . BPH (benign prostatic hyperplasia) 01/13/2019  . Acute lower UTI 01/13/2019  . Sepsis ([place]) 01/12/2019  . Chest pain in adult 10/11/2018  . Hematuria 10/11/2018  . Community acquired pneumonia 10/11/2018  . CVA (cerebral vascular accident) ([place]) 10/11/2018  . Parkinson's disease ([place]) 10/11/2018  . Benign essential HTN 10/11/2018  . HLD (hyperlipidemia) 10/11/2018  . Bladder tumor 05/07/2017    Orientation RESPIRATION BLADDER Height & Weight     Self, Time, Situation, Place  Normal Continent Weight: 180 lb (81.6 kg) Height:  6' (182.9 cm)  BEHAVIORAL SYMPTOMS/MOOD NEUROLOGICAL BOWEL NUTRITION STATUS      Continent Diet  AMBULATORY STATUS COMMUNICATION OF NEEDS Skin   Extensive Assist Verbally Normal                       Personal Care Assistance Level of Assistance  Bathing, Feeding, Dressing Bathing Assistance: Limited assistance Feeding assistance: Independent Dressing Assistance: Limited assistance     Functional Limitations Info  Sight, Hearing, Speech Sight Info: Adequate Hearing Info: Adequate Speech Info: Adequate    SPECIAL CARE FACTORS FREQUENCY  PT (By licensed PT)     PT Frequency: 5 times week               Contractures Contractures Info: Not present    Additional Factors Info  Code Status, Allergies Code Status Info: Full Allergies Info: Benazepril, Doxycycline, Levofloxacin, Simvastatin, Tamsulosin, Tramadol Hcl, Sulfa Antibiotics           Current Medications (01/13/2019):  This is the current hospital active medication list Current Facility-Administered Medications  Medication Dose Route Frequency Provider Last Rate Last Dose  . 0.9 %  sodium chloride infusion   Intravenous Continuous Caren Griffins, MD   Stopped at 01/13/19 1200  . acetaminophen (TYLENOL) tablet 650 mg  650 mg Oral Q6H PRN Caren Griffins, MD   650 mg at 01/13/19 0005   Or  . acetaminophen (TYLENOL) suppository 650 mg  650 mg Rectal Q6H PRN Caren Griffins, MD      . atorvastatin (LIPITOR) tablet 40 mg  40 mg Oral QPM Caren Griffins, MD   40 mg at 01/12/19 1825  . Carbidopa-Levodopa ER 36.25-145 MG CPCR 3 capsule  3 capsule Oral QID Caren Griffins, MD      . clopidogrel (PLAVIX) tablet 75 mg  75 mg Oral Daily Caren Griffins, MD   75 mg at 01/13/19 1018  . Dextromethorphan-quiNIDine (NUEDEXTA) 20-10 MG per capsule 1 capsule  1 capsule Oral BID Caren Griffins, MD      . finasteride (PROSCAR) tablet 5 mg  5 mg Oral Daily Caren Griffins, MD   5 mg at 01/13/19 1018  . HYDROcodone-acetaminophen (NORCO/VICODIN) 5-325  MG per tablet 1 tablet  1 tablet Oral Q4H PRN Caren Griffins, MD   1 tablet at 01/13/19 [id number]  . Istradefylline TABS 1 tablet  1 tablet Oral Daily Gherghe, Costin M, MD      . lidocaine (PF) (XYLOCAINE) 1 % injection 5 mL  5 mL Intradermal Once Noemi Chapel, MD      . meclizine (ANTIVERT) tablet 25 mg  25 mg Oral TID PRN Caren Griffins, MD      . metoprolol succinate (TOPROL-XL) 24 hr tablet 50 mg  50 mg Oral QPM Gherghe, Costin M, MD      . ondansetron (ZOFRAN) tablet 4 mg  4 mg Oral Q6H PRN Caren Griffins, MD       Or  . ondansetron (ZOFRAN) injection 4 mg  4 mg Intravenous  Q6H PRN Caren Griffins, MD      . oxybutynin (DITROPAN) tablet 5 mg  5 mg Oral Q8H PRN Kathie Dike, MD   5 mg at 01/13/19 1018  . piperacillin-tazobactam (ZOSYN) IVPB 3.375 g  3.375 g Intravenous Q8H Gherghe, Costin M, MD 12.5 mL/hr at 01/13/19 1017 3.375 g at 01/13/19 1017  . vancomycin (VANCOCIN) 1,250 mg in sodium chloride 0.9 % 250 mL IVPB  1,250 mg Intravenous Q24H Caren Griffins, MD       Current Outpatient Medications  Medication Sig Dispense Refill  . atorvastatin (LIPITOR) 40 MG tablet Take 40 mg by mouth every evening.    . Carbidopa-Levodopa ER (RYTARY) 36.25-145 MG CPCR Take 3 capsules by mouth 4 (four) times daily.     . clopidogrel (PLAVIX) 75 MG tablet Take 1 tablet (75 mg total) by mouth daily. Hold on to follow-up with urology service. (Patient taking differently: Take 75 mg by mouth every evening. )    . furosemide (LASIX) 20 MG tablet Take 1 tablet (20 mg total) by mouth as needed. (Patient taking differently: Take 20 mg by mouth daily as needed for fluid. ) 90 tablet 1  . Istradefylline 20 MG TABS Take 20 mg by mouth every morning. NOURIANZ    . meclizine (ANTIVERT) 25 MG tablet Take 25 mg by mouth 3 (three) times daily as needed for dizziness.   2  . metoprolol succinate (TOPROL-XL) 100 MG 24 hr tablet Take 100 mg by mouth every evening.    Marland Kitchen NUEDEXTA 20-10 MG CAPS Take 1 capsule by mouth 2 (two) times daily.  3     Discharge Medications: Please see discharge summary for a list of discharge medications.  Relevant Imaging Results:  Relevant Lab Results:   Additional Information    Shade Flood, LCSW

## 2019-01-14 LAB — BASIC METABOLIC PANEL
Anion gap: 8 (ref 5–15)
BUN: 9 mg/dL (ref 8–23)
CO2: 24 mmol/L (ref 22–32)
Calcium: 8.2 mg/dL — ABNORMAL LOW (ref 8.9–10.3)
Chloride: 104 mmol/L (ref 98–111)
Creatinine, Ser: 1.09 mg/dL (ref 0.61–1.24)
GFR calc Af Amer: >60 mL/min (ref >60)
GFR calc non Af Amer: >60 mL/min (ref >60)
Glucose, Bld: 133 mg/dL — ABNORMAL HIGH (ref 70–99)
Potassium: 3.3 mmol/L — ABNORMAL LOW (ref 3.5–5.1)
Sodium: 136 mmol/L (ref 135–145)

## 2019-01-14 LAB — CBC
HCT: 37.3 % — ABNORMAL LOW (ref 39.0–52.0)
Hemoglobin: 11.3 g/dL — ABNORMAL LOW (ref 13.0–17.0)
MCH: 25.1 pg — ABNORMAL LOW (ref 26.0–34.0)
MCHC: 30.3 g/dL (ref 30.0–36.0)
MCV: 82.9 fL (ref 80.0–100.0)
Platelets: 183 10*3/uL (ref 150–400)
RBC: 4.5 MIL/uL (ref 4.22–5.81)
RDW: 13.1 % (ref 11.5–15.5)
WBC: 8.3 10*3/uL (ref 4.0–10.5)
nRBC: 0 % (ref 0.0–0.2)

## 2019-01-14 LAB — HSV CULTURE AND TYPING

## 2019-01-14 MED ORDER — KETOROLAC TROMETHAMINE 15 MG/ML IJ SOLN: 15.0000 mg | Freq: Four times a day (QID) | INTRAMUSCULAR | Status: DC | PRN

## 2019-01-14 MED ORDER — KETOROLAC TROMETHAMINE 30 MG/ML IJ SOLN: 30.0000 mg | Freq: Four times a day (QID) | INTRAMUSCULAR | Status: DC | PRN

## 2019-01-14 MED ORDER — PHENAZOPYRIDINE HCL 100 MG PO TABS
100.0000 mg | ORAL_TABLET | Freq: Three times a day (TID) | ORAL | Status: DC
  Administered 2019-01-14 – 2019-01-16 (×7): 100 mg via ORAL
  Filled 2019-01-14 (×7): qty 1

## 2019-01-14 MED ORDER — POTASSIUM CHLORIDE CRYS ER 20 MEQ PO TBCR
40.0000 meq | EXTENDED_RELEASE_TABLET | Freq: Once | ORAL | Status: AC
  Administered 2019-01-14: 40 meq via ORAL
  Filled 2019-01-14: qty 2

## 2019-01-14 NOTE — Progress Notes (Addendum)
PROGRESS NOTE    Mike Nixon  FBP:[id number] DOB: Apr 11, 1941 DOA: 01/12/2019 PCP: [name], MD    Brief Narrative:  78 year old male with a history of prior CVA, Parkinson's disease, hypertension, was brought to the hospital with increasing confusion, headache was noted to be febrile in the emergency room.  Lumbar puncture performed did not indicate any CNS infection.  Urinalysis indicated possible urinary infection.  CT scan of the abdomen pelvis indicated thickening around bladder versus prostate.  He has been started on broad-spectrum antibiotics.  We will follow-up urine culture.   Assessment & Plan:   Active Problems:   Parkinson's disease ([place])   Benign essential HTN   HLD (hyperlipidemia)   Sepsis ([facility])   BPH (benign prostatic hyperplasia)   Acute lower UTI   1. Sepsis, related to urinary tract infection.  Urine culture positive for Pseudomonas.  He was febrile on admission, but with antibiotic therapy this appears to have improved.  Currently on IV Zosyn.  CT of the abdomen pelvis also indicated possible urinary source.  Case discussed with Dr. Louis Meckel on-call for urology who recommended checking postvoid residual scans to ensure that he is emptying his bladder adequately.  If patient is unable to sufficiently empty his bladder, he may need a Foley catheter.  Continue current treatments. 2. Headache.  Unclear etiology.  CT scan of the head is unremarkable. Patient also had lumbar puncture done which did not show any signs of infection.  Of note, he had headache prior to lumbar puncture.  Does not have any photophobia or changes in vision.  Does not have any tenderness with palpation over his temples. Treat with Toradol. 3. Dysphagia.  Patient reports noting difficulty with drinking water which is often followed by shortness of breath and coughing.  Will request speech therapy for evaluation 4. Prior CVA.  He has residual right-sided weakness.  Continue on  Plavix. 5. Hypertension.  Blood pressures currently stable. 6. Parkinson's disease.  Continue home medications. 7. Hyperlipidemia.  Continue statin. 8. Recurrent falls at home.  Seen by physical therapy with recommendations for skilled nursing facility placement   DVT prophylaxis: SCDs Code Status: Full code Family Communication: Discussed with wife at the bedside Disposition Plan: Skilled nursing facility on discharge   Consultants:     Procedures:     Antimicrobials:   Vancomycin 2/16 >2/18  Zosyn 2/16 >   Subjective: No fevers overnight.  Continues to have headache in his bilateral temples.  No changes in vision or photophobia.  No neck pain.  He feels that removing his urinary catheter has helped with his abdominal discomfort, but he continues to have dysuria.  He also reports shortness of breath after he drinks anything.  Objective: Vitals:   01/13/19 1435 01/13/19 1939 01/13/19 2115 01/14/19 1347  BP: 125/80  131/77 131/77  Pulse: 71  77 63  Resp: 16   17  Temp: 98.7 F (37.1 C)  99 F (37.2 C) 98.7 F (37.1 C)  TempSrc: Oral  Oral Oral  SpO2: 98% 99% 98% 96%  Weight: 80.2 kg     Height: 5\' 7"  (1.702 m)       Intake/Output Summary (Last 24 hours) at 01/14/2019 1625 Last data filed at 01/14/2019 1624 Gross per 24 hour  Intake 2400.35 ml  Output 300 ml  Net 2100.35 ml   Filed Weights   01/12/19 1221 01/13/19 1435  Weight: 81.6 kg 80.2 kg    Examination:  General exam: Appears calm and comfortable  Respiratory system: Clear to auscultation. Respiratory effort normal. Cardiovascular system: S1 & S2 heard, RRR. No JVD, murmurs, rubs, gallops or clicks. No pedal edema. Gastrointestinal system: Abdomen is nondistended, soft and nontender. No organomegaly or masses felt. Normal bowel sounds heard. Central nervous system: Alert and oriented. No focal neurological deficits. Extremities: Symmetric 5 x 5 power. Skin: No rashes, lesions or ulcers Psychiatry:  Judgement and insight appear normal. Mood & affect appropriate.     Data Reviewed: I have personally reviewed following labs and imaging studies  CBC: Recent Labs  Lab 01/12/19 1242 01/13/19 0744 01/14/19 0417  WBC 14.4* 11.1* 8.3  NEUTROABS 11.8*  --   --   HGB 13.7 11.9* 11.3*  HCT 46.4 38.6* 37.3*  MCV 83.2 82.8 82.9  PLT 186 158 009   Basic Metabolic Panel: Recent Labs  Lab 01/12/19 1242 01/13/19 0744 01/14/19 0417  NA 138 135 136  K 3.5 3.4* 3.3*  CL 103 103 104  CO2 25 23 24   GLUCOSE 147* 137* 133*  BUN 15 14 9   CREATININE 1.31* 1.27* 1.09  CALCIUM 8.8* 8.2* 8.2*   GFR: Estimated Creatinine Clearance: 58.5 mL/min (by C-G formula based on SCr of 1.09 mg/dL). Liver Function Tests: Recent Labs  Lab 01/12/19 1242 01/13/19 0744  AST 18 13*  ALT 22 16  ALKPHOS 72 55  BILITOT 3.6* 2.3*  PROT 7.2 5.7*  ALBUMIN 3.8 2.8*   No results for input(s): LIPASE, AMYLASE in the last 168 hours. No results for input(s): AMMONIA in the last 168 hours. Coagulation Profile: No results for input(s): INR, PROTIME in the last 168 hours. Cardiac Enzymes: No results for input(s): CKTOTAL, CKMB, CKMBINDEX, TROPONINI in the last 168 hours. BNP (last 3 results) No results for input(s): PROBNP in the last 8760 hours. HbA1C: No results for input(s): HGBA1C in the last 72 hours. CBG: Recent Labs  Lab 01/12/19 1218  GLUCAP 140*   Lipid Profile: No results for input(s): CHOL, HDL, LDLCALC, TRIG, CHOLHDL, LDLDIRECT in the last 72 hours. Thyroid Function Tests: No results for input(s): TSH, T4TOTAL, FREET4, T3FREE, THYROIDAB in the last 72 hours. Anemia Panel: No results for input(s): VITAMINB12, FOLATE, FERRITIN, TIBC, IRON, RETICCTPCT in the last 72 hours. Sepsis Labs: Recent Labs  Lab 01/12/19 1258 01/12/19 1441  LATICACIDVEN 2.3* 1.7    Recent Results (from the past 240 hour(s))  Urine culture     Status: Abnormal (Preliminary result)   Collection Time: 01/12/19  12:35 PM  Result Value Ref Range Status   Specimen Description   Final    URINE, CLEAN CATCH Performed at [facility], [address], [place], [place] [address]    Special Requests   Final    NONE Performed at [facility], [address], [place], [place] [address]    Culture (A)  Final    50,000 COLONIES/mL PSEUDOMONAS AERUGINOSA CULTURE REINCUBATED FOR BETTER GROWTH Performed at [facility], [place] [address], [place], [place] [address]    Report Status PENDING  Incomplete  Blood Culture (routine x 2)     Status: None (Preliminary result)   Collection Time: 01/12/19 12:42 PM  Result Value Ref Range Status   Specimen Description   Final    RIGHT ANTECUBITAL BOTTLES DRAWN AEROBIC AND ANAEROBIC   Special Requests Blood Culture adequate volume  Final   Culture   Final    NO GROWTH 2 DAYS Performed at [facility], [address], [place], [place] [address]    Report  Status PENDING  Incomplete  Blood Culture (routine x 2)     Status: None (Preliminary result)   Collection Time: 01/12/19 12:42 PM  Result Value Ref Range Status   Specimen Description   Final    BLOOD LEFT HAND BOTTLES DRAWN AEROBIC AND ANAEROBIC   Special Requests   Final    Blood Culture results may not be optimal due to an inadequate volume of blood received in culture bottles   Culture   Final    NO GROWTH 2 DAYS Performed at [facility], [address] [place], [place] [address]    Report Status PENDING  Incomplete  CSF culture     Status: None (Preliminary result)   Collection Time: 01/12/19  2:40 PM  Result Value Ref Range Status   Specimen Description   Final    CSF Performed at [facility], [address], [place], [place] [address]    Special Requests   Final    Normal Performed at [facility], [address], [place], [place] [address]    Gram Stain   Final    CYTOSPIN SMEAR NO CELLS OR ORGANISMS SEEN Gram Stain Report Called to,Read Back By and Verified With: WHITE,M. AT 1749 ON  01/12/2019 BY EVA Performed at [facility], [address], [place], [place] [address]    Culture   Final    NO GROWTH 2 DAYS Performed at [facility], [place] [address] [place], [place] [address]    Report Status PENDING  Incomplete         Radiology Studies: Ct Abdomen Pelvis W Contrast  Result Date: 01/12/2019 CLINICAL DATA:  Abdominal pain, headache. Fever, abscess suspected. EXAM: CT ABDOMEN AND PELVIS WITH CONTRAST TECHNIQUE: Multidetector CT imaging of the abdomen and pelvis was performed using the standard protocol following bolus administration of intravenous contrast. CONTRAST:  176mL OMNIPAQUE IOHEXOL 300 MG/ML  SOLN COMPARISON:  CT abdomen dated 10/11/2018. FINDINGS: Lower chest: No acute abnormality. Hepatobiliary: No focal liver abnormality is seen. No gallstones, gallbladder wall thickening, or biliary dilatation. Pancreas: Unremarkable. No pancreatic ductal dilatation or surrounding inflammatory changes. Spleen: Normal in size without focal abnormality. Adrenals/Urinary Tract: Adrenal glands appear normal. Kidneys are unremarkable without suspicious mass, stone or hydronephrosis. No ureteral or bladder calculi appreciated. Bladder is decompressed by Foley catheter. Stomach/Bowel: No dilated large or small bowel loops. No definitive evidence of bowel wall inflammation or mesenteric inflammation. There is new mild inflammation/fluid stranding within the presacral space of the lower pelvis, of uncertain etiology. Vascular/Lymphatic: No significant vascular findings are present. No enlarged abdominal or pelvic lymph nodes. Reproductive: Prostate gland is enlarged. Other: No abscess collection. No free intraperitoneal air. Musculoskeletal: Degenerative spondylosis throughout the thoracolumbar spine, mild to moderate in degree. No acute or suspicious osseous finding. IMPRESSION: 1. New mild inflammation/fluid stranding within the presacral space of the lower pelvis. This is of  uncertain etiology but given its proximity to the rectum and bladder could be sequela of either proctocolitis, prostatitis or bladder infection. 2. Prostate gland is enlarged. 3. Bladder decompressed by Foley catheter. 4. Remainder of the abdomen and pelvis CT is unremarkable, as detailed above. No bowel obstruction. No abscess collection or free intraperitoneal air. No evidence of pyelonephritis. Electronically Signed   By: Franki Cabot M.D.   On: 01/12/2019 17:40        Scheduled Meds: . atorvastatin  40 mg Oral QPM  . Carbidopa-Levodopa ER  3 capsule Oral QID  . clopidogrel  75 mg Oral  Daily  . Dextromethorphan-quiNIDine  1 capsule Oral BID  . finasteride  5 mg Oral Daily  . lidocaine (PF)  5 mL Intradermal Once  . metoprolol succinate  50 mg Oral QPM  . phenazopyridine  100 mg Oral TID WC   Continuous Infusions: . sodium chloride 75 mL/hr at 01/14/19 0938  . piperacillin-tazobactam (ZOSYN)  IV 3.375 g (01/14/19 0938)     LOS: 1 day    Time spent: 63mins    Kathie Dike, MD [facility]   If 7PM-7AM, please contact night-coverage [email or web address]  01/14/2019, 4:25 PM

## 2019-01-14 NOTE — Progress Notes (Signed)
Physical Therapy Treatment Patient Details Name: Mike Nixon MRN: [id number] DOB: 1941/09/04 Today's Date: 01/14/2019    History of Present Illness Mike Nixon is a 78 y.o. 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  Patient has also been complaining of a headache for the last day or 2.  He also tells me that he has been having generalized abdominal pain since Friday.  He denies any nausea or vomiting, and family states that he has been eating without difficulties, but did have a poor appetite over the last couple of days.  He denies any diarrhea.  He denies any neck pain but does complain of headache.  Denies any photophobia.  He denies any chest pain or shortness of breath.  Wife also tells me that he has been complaining of difficulties urinating since last night, feels the urge to go but cannot.  Family also reports that he has been diagnosed and treated for pneumonia about 6 weeks ago.  He denies any chest congestion or cough.  He denies any flulike illness.    PT Comments    Patient demonstrates increased strength and endurance for bed mobility, transfers and gait training without loss of balance, unsteady on feet and limited mostly due to c/o stomach/groin pain.  Patient tolerated sitting up in chair with his spouse present at bedside.  Patient will benefit from continued physical therapy in hospital and recommended venue below to increase strength, balance, endurance for safe ADLs and gait.    Follow Up Recommendations  SNF;Supervision/Assistance - 24 hour;Supervision for mobility/OOB     Equipment Recommendations  None recommended by PT    Recommendations for Other Services       Precautions / Restrictions Precautions Precautions:  Fall Restrictions Weight Bearing Restrictions: No    Mobility  Bed Mobility Overal bed mobility: Needs Assistance Bed Mobility: Supine to Sit     Supine to sit: Min guard     General bed mobility comments: slow labored movement, required less assistance  Transfers Overall transfer level: Needs assistance Equipment used: Rolling walker (2 wheeled) Transfers: Sit to/from [facility] Sit to Stand: Min guard Stand pivot transfers: Min assist       General transfer comment: slow labored movement  Ambulation/Gait Ambulation/Gait assistance: Min assist Gait Distance (Feet): 60 Feet Assistive device: Rolling walker (2 wheeled) Gait Pattern/deviations: Decreased step length - right;Decreased step length - left;Decreased stride length Gait velocity: slow   General Gait Details: increased endurance/distance for ambulation with slow labored ataxic like steps requiring increased time   Stairs             Wheelchair Mobility    Modified Rankin (Stroke Patients Only)       Balance Overall balance assessment: Needs assistance Sitting-balance support: Feet supported;No upper extremity supported Sitting balance-Leahy Scale: Good     Standing balance support: Bilateral upper extremity supported;During functional activity Standing balance-Leahy Scale: Fair Standing balance comment: using RW                            Cognition Arousal/Alertness: Awake/alert Behavior During Therapy: WFL for tasks assessed/performed Overall Cognitive Status: Within Functional Limits for tasks assessed  Exercises General Exercises - Lower Extremity Long Arc Quad: Seated;AROM;Strengthening;Both;10 reps Hip Flexion/Marching: Seated;Strengthening;AROM;10 reps;Both Toe Raises: Seated;Strengthening;AROM;Both;10 reps Heel Raises: Seated;AROM;Strengthening;Both;10 reps    General Comments         Pertinent Vitals/Pain Pain Assessment: Faces Faces Pain Scale: Hurts even more Pain Location: stomach and groin area Pain Descriptors / Indicators: Burning;Grimacing;Guarding;Moaning Pain Intervention(s): Limited activity within patient's tolerance;Monitored during session    Home Living                      Prior Function            PT Goals (current goals can now be found in the care plan section) Acute Rehab PT Goals Patient Stated Goal: return home PT Goal Formulation: With patient/family Time For Goal Achievement: 01/27/19 Progress towards PT goals: Progressing toward goals    Frequency    Min 3X/week      PT Plan Current plan remains appropriate    Co-evaluation              AM-PAC PT "6 Clicks" Mobility   Outcome Measure  Help needed turning from your back to your side while in a flat bed without using bedrails?: A Little Help needed moving from lying on your back to sitting on the side of a flat bed without using bedrails?: A Little Help needed moving to and from a bed to a chair (including a wheelchair)?: A Little Help needed standing up from a chair using your arms (e.g., wheelchair or bedside chair)?: A Little Help needed to walk in hospital room?: A Lot Help needed climbing 3-5 steps with a railing? : Total 6 Click Score: 15    End of Session   Activity Tolerance: Patient tolerated treatment well;Patient limited by fatigue Patient left: in chair;with call bell/phone within reach;with family/visitor present Nurse Communication: Mobility status PT Visit Diagnosis: Unsteadiness on feet (R26.81);Other abnormalities of gait and mobility (R26.89);History of falling (Z91.81)     Time: 0051-1021 PT Time Calculation (min) (ACUTE ONLY): 30 min  Charges:  $Gait Training: 8-22 mins $Therapeutic Exercise: 8-22 mins                     2:41 PM, 01/14/19 Lonell Grandchild, MPT Physical Therapist with [facility] 336 [phone]  office [phone] mobile phone

## 2019-01-14 NOTE — Clinical Social Work Placement (Signed)
   CLINICAL SOCIAL WORK PLACEMENT  NOTE  Date:  01/14/2019  Patient Details  Name: Tyion Boylen MRN: [id number] Date of Birth: 14-Mar-1941  Clinical Social Work is seeking post-discharge placement for this patient at the [place] level of care (*CSW will initial, date and re-position this form in  chart as items are completed):  Yes   Patient/family provided with [place] Work Department's list of facilities offering this level of care within the geographic area requested by the patient (or if unable, by the patient's family).  Yes   Patient/family informed of their freedom to choose among providers that offer the needed level of care, that participate in Medicare, Medicaid or managed care program needed by the patient, have an available bed and are willing to accept the patient.  Yes   Patient/family informed of [facility]'s ownership interest in [facility] and [facility], as well as of the fact that they are under no obligation to receive care at these facilities.  PASRR submitted to EDS on (n/a due to [place] residency status)     PASRR number received on       Existing PASRR number confirmed on       FL2 transmitted to all facilities in geographic area requested by pt/family on 01/14/19     FL2 transmitted to all facilities within larger geographic area on       Patient informed that his/her managed care company has contracts with or will negotiate with certain facilities, including the following:        Yes   Patient/family informed of bed offers received.  Patient chooses bed at [facility]     Physician recommends and patient chooses bed at      Patient to be transferred to [facility] on  .  Patient to be transferred to facility by       Patient family notified on   of transfer.  Name of family member notified:        PHYSICIAN       Additional Comment:  Facility started  insurance authorization today.   _______________________________________________ Ihor Gully, LCSW 01/14/2019, 2:56 PM

## 2019-01-14 NOTE — Clinical Social Work Note (Signed)
Message left for Mike Nixon at [facility] and Mike Nixon and Mike Nixon at [facility] in regards to referral sent on 01/13/2019.      Derreck Wiltsey, Clydene Pugh, LCSW

## 2019-01-14 NOTE — Care Management (Signed)
CM consulted for home health. Apparently patient's wife has been talking with [facility], but has not started services. Patient has been recommended for SNF and CSW is working with patient to make accomodation.

## 2019-01-15 ENCOUNTER — Inpatient Hospital Stay (HOSPITAL_COMMUNITY): Payer: Medicare HMO

## 2019-01-15 LAB — BASIC METABOLIC PANEL
Anion gap: 8 (ref 5–15)
BUN: 8 mg/dL (ref 8–23)
CO2: 25 mmol/L (ref 22–32)
Calcium: 8.9 mg/dL (ref 8.9–10.3)
Chloride: 103 mmol/L (ref 98–111)
Creatinine, Ser: 1.02 mg/dL (ref 0.61–1.24)
GFR calc Af Amer: >60 mL/min (ref >60)
GFR calc non Af Amer: >60 mL/min (ref >60)
Glucose, Bld: 115 mg/dL — ABNORMAL HIGH (ref 70–99)
Potassium: 3.8 mmol/L (ref 3.5–5.1)
Sodium: 136 mmol/L (ref 135–145)

## 2019-01-15 LAB — URINE CULTURE: Culture: 50000 — AB

## 2019-01-15 LAB — CBC
HCT: 40.2 % (ref 39.0–52.0)
Hemoglobin: 12 g/dL — ABNORMAL LOW (ref 13.0–17.0)
MCH: 24.5 pg — ABNORMAL LOW (ref 26.0–34.0)
MCHC: 29.9 g/dL — ABNORMAL LOW (ref 30.0–36.0)
MCV: 82.2 fL (ref 80.0–100.0)
Platelets: 208 10*3/uL (ref 150–400)
RBC: 4.89 MIL/uL (ref 4.22–5.81)
RDW: 13.2 % (ref 11.5–15.5)
WBC: 6.9 10*3/uL (ref 4.0–10.5)
nRBC: 0 % (ref 0.0–0.2)

## 2019-01-15 NOTE — Progress Notes (Signed)
PROGRESS NOTE    Mike Nixon  KXF:[id number] DOB: [birth year] DOA: 01/12/2019 PCP: [name], MD    Brief Narrative:  78 year old male with a history of prior CVA, Parkinson's disease, hypertension, was brought to the hospital with increasing confusion, headache was noted to be febrile in the emergency room.  Lumbar puncture performed did not indicate any CNS infection.  Urinalysis indicated possible urinary infection.  CT scan of the abdomen pelvis indicated thickening around bladder versus prostate.  He has been started on broad-spectrum antibiotics.  We will follow-up urine culture.   Assessment & Plan:   Active Problems:   Parkinson's disease ([place])   Benign essential HTN   HLD (hyperlipidemia)   Sepsis ([facility])   BPH (benign prostatic hyperplasia)   Acute lower UTI   1. Sepsis, related to urinary tract infection.  Urine culture positive for Pseudomonas.  He was febrile on admission, but with antibiotic therapy this appears to have improved.  Currently on IV Zosyn.  CT of the abdomen pelvis also indicated possible urinary source.  Postvoid residual bladder scan checked and did not show any urinary retention.  Case discussed with Dr. Alyson Ingles who is patient's primary urologist with no further recommendations.  He will need to follow-up with urology as an outpatient to be considered for prostatectomy. 2. Headache.  Unclear etiology.  CT scan of the head is unremarkable. Patient also had lumbar puncture done which did not show any signs of infection.  Of note, he had headache prior to lumbar puncture.  Does not have any photophobia or changes in vision.  Does not have any tenderness with palpation over his temples.  This is resolved with Toradol. 3. Dysphagia.  Patient reports noting difficulty with drinking water which is often followed by shortness of breath and coughing.  Speech therapy evaluation today with modified barium swallow 4. Prior CVA.  He has residual right-sided weakness.   Continue on Plavix. 5. Hypertension.  Blood pressures currently stable. 6. Parkinson's disease.  Continue home medications. 7. Hyperlipidemia.  Continue statin. 8. Recurrent falls at home.  Seen by physical therapy with recommendations for skilled nursing facility placement   DVT prophylaxis: SCDs Code Status: Full code Family Communication: Discussed with wife at the bedside Disposition Plan: Skilled nursing facility on discharge   Consultants:     Procedures:     Antimicrobials:   Vancomycin 2/16 >2/18  Zosyn 2/16 >   Subjective: Feeling better.  Headaches are better.  Dysuria is improving.  Plans are for swallow evaluation later today.  Objective: Vitals:   01/14/19 2141 01/15/19 0655 01/15/19 0656 01/15/19 1322  BP:  (!) 161/98 (!) 162/88 (!) 142/77  Pulse:  62 66 (!) 58  Resp:  18  18  Temp:  97.9 F (36.6 C)  98.4 F (36.9 C)  TempSrc:  Oral  Oral  SpO2: 96% 98%  98%  Weight:      Height:        Intake/Output Summary (Last 24 hours) at 01/15/2019 1838 Last data filed at 01/15/2019 1745 Gross per 24 hour  Intake 908.99 ml  Output 600 ml  Net 308.99 ml   Filed Weights   01/12/19 1221 01/13/19 1435  Weight: 81.6 kg 80.2 kg    Examination:  General exam: Alert, awake, oriented x 3 Respiratory system: Clear to auscultation. Respiratory effort normal. Cardiovascular system:RRR. No murmurs, rubs, gallops. Gastrointestinal system: Abdomen is nondistended, soft and nontender. No organomegaly or masses felt. Normal bowel sounds heard. Central nervous system:  Alert and oriented. No focal neurological deficits. Extremities: No C/C/E, +pedal pulses Skin: No rashes, lesions or ulcers Psychiatry: Judgement and insight appear normal. Mood & affect appropriate.     Data Reviewed: I have personally reviewed following labs and imaging studies  CBC: Recent Labs  Lab 01/12/19 1242 01/13/19 0744 01/14/19 0417 01/15/19 0410  WBC 14.4* 11.1* 8.3 6.9    NEUTROABS 11.8*  --   --   --   HGB 13.7 11.9* 11.3* 12.0*  HCT 46.4 38.6* 37.3* 40.2  MCV 83.2 82.8 82.9 82.2  PLT 186 158 183 700   Basic Metabolic Panel: Recent Labs  Lab 01/12/19 1242 01/13/19 0744 01/14/19 0417 01/15/19 0410  NA 138 135 136 136  K 3.5 3.4* 3.3* 3.8  CL 103 103 104 103  CO2 25 23 24 25   GLUCOSE 147* 137* 133* 115*  BUN 15 14 9 8   CREATININE 1.31* 1.27* 1.09 1.02  CALCIUM 8.8* 8.2* 8.2* 8.9   GFR: Estimated Creatinine Clearance: 62.5 mL/min (by C-G formula based on SCr of 1.02 mg/dL). Liver Function Tests: Recent Labs  Lab 01/12/19 1242 01/13/19 0744  AST 18 13*  ALT 22 16  ALKPHOS 72 55  BILITOT 3.6* 2.3*  PROT 7.2 5.7*  ALBUMIN 3.8 2.8*   No results for input(s): LIPASE, AMYLASE in the last 168 hours. No results for input(s): AMMONIA in the last 168 hours. Coagulation Profile: No results for input(s): INR, PROTIME in the last 168 hours. Cardiac Enzymes: No results for input(s): CKTOTAL, CKMB, CKMBINDEX, TROPONINI in the last 168 hours. BNP (last 3 results) No results for input(s): PROBNP in the last 8760 hours. HbA1C: No results for input(s): HGBA1C in the last 72 hours. CBG: Recent Labs  Lab 01/12/19 1218  GLUCAP 140*   Lipid Profile: No results for input(s): CHOL, HDL, LDLCALC, TRIG, CHOLHDL, LDLDIRECT in the last 72 hours. Thyroid Function Tests: No results for input(s): TSH, T4TOTAL, FREET4, T3FREE, THYROIDAB in the last 72 hours. Anemia Panel: No results for input(s): VITAMINB12, FOLATE, FERRITIN, TIBC, IRON, RETICCTPCT in the last 72 hours. Sepsis Labs: Recent Labs  Lab 01/12/19 1258 01/12/19 1441  LATICACIDVEN 2.3* 1.7    Recent Results (from the past 240 hour(s))  Urine culture     Status: Abnormal   Collection Time: 01/12/19 12:35 PM  Result Value Ref Range Status   Specimen Description   Final    URINE, CLEAN CATCH Performed at [facility], [address], [place], [place] [address]    Special Requests   Final     NONE Performed at [facility], [address] [place], [place] [address]    Culture (A)  Final    50,000 COLONIES/mL ENTEROBACTER CLOACAE 20,000 COLONIES/mL PSEUDOMONAS AERUGINOSA    Report Status 01/15/2019 FINAL  Final   Organism ID, Bacteria ENTEROBACTER CLOACAE (A)  Final   Organism ID, Bacteria PSEUDOMONAS AERUGINOSA (A)  Final      Susceptibility   Enterobacter cloacae - MIC*    CEFAZOLIN >=64 RESISTANT Resistant     CEFTRIAXONE 2 SENSITIVE Sensitive     CIPROFLOXACIN <=0.25 SENSITIVE Sensitive     GENTAMICIN <=1 SENSITIVE Sensitive     IMIPENEM <=0.25 SENSITIVE Sensitive     NITROFURANTOIN 64 INTERMEDIATE Intermediate     TRIMETH/SULFA <=20 SENSITIVE Sensitive     PIP/TAZO 8 SENSITIVE Sensitive     * 50,000 COLONIES/mL ENTEROBACTER CLOACAE   Pseudomonas aeruginosa - MIC*    CEFTAZIDIME 2 SENSITIVE Sensitive     CIPROFLOXACIN <=  0.25 SENSITIVE Sensitive     GENTAMICIN <=1 SENSITIVE Sensitive     IMIPENEM 2 SENSITIVE Sensitive     PIP/TAZO 8 SENSITIVE Sensitive     CEFEPIME <=1 SENSITIVE Sensitive     * 20,000 COLONIES/mL PSEUDOMONAS AERUGINOSA  Blood Culture (routine x 2)     Status: None (Preliminary result)   Collection Time: 01/12/19 12:42 PM  Result Value Ref Range Status   Specimen Description   Final    RIGHT ANTECUBITAL BOTTLES DRAWN AEROBIC AND ANAEROBIC   Special Requests Blood Culture adequate volume  Final   Culture   Final    NO GROWTH 3 DAYS Performed at [facility], [address] [place], [place] [address]    Report Status PENDING  Incomplete  Blood Culture (routine x 2)     Status: None (Preliminary result)   Collection Time: 01/12/19 12:42 PM  Result Value Ref Range Status   Specimen Description   Final    BLOOD LEFT HAND BOTTLES DRAWN AEROBIC AND ANAEROBIC   Special Requests   Final    Blood Culture results may not be optimal due to an inadequate volume of blood received in culture bottles   Culture   Final    NO GROWTH 3 DAYS Performed at  [facility], [address], [place], [place] [address]    Report Status PENDING  Incomplete  CSF culture     Status: None (Preliminary result)   Collection Time: 01/12/19  2:40 PM  Result Value Ref Range Status   Specimen Description   Final    CSF Performed at [facility], [address] [place], [place] [address]    Special Requests   Final    Normal Performed at [facility], [address] [place], [place] [address]    Gram Stain   Final    CYTOSPIN SMEAR NO CELLS OR ORGANISMS SEEN Gram Stain Report Called to,Read Back By and Verified With: WHITE,M. AT 4193 ON 01/12/2019 BY EVA Performed at [facility], [address] [place], [place] [address]    Culture   Final    NO GROWTH 3 DAYS Performed at [facility], [place] [address], [place],  [address]    Report Status PENDING  Incomplete  Hsv Culture And Typing     Status: None   Collection Time: 01/12/19  2:40 PM  Result Value Ref Range Status   HSV Culture/Type Comment  Final    Comment: (NOTE) Negative No Herpes simplex virus isolated. Performed At: [facility] [place], [place] [id number] Rush Farmer MD [id number]    Source of Sample CSF  Final    Comment: Performed at [facility], [address] [place],  [address]         Radiology Studies: Dg Swallowing Func-speech Pathology  Result Date: 01/15/2019 Objective Swallowing Evaluation: Type of Study: MBS-Modified Barium Swallow Study  Patient Details Name: Mike Nixon MRN: [id number] Date of Birth: [birth year] Today's Date: 01/15/2019 Time: SLP Start Time (ACUTE ONLY): 1110 -SLP Stop Time (ACUTE ONLY): 1139 SLP Time Calculation (min) (ACUTE ONLY): 29 min Past Medical History: Past Medical History: Diagnosis Date . Anticoagulant long-term use   PLAVIX . Benign localized prostatic hyperplasia with lower urinary tract symptoms (LUTS)  . Bladder tumor  . Borderline diabetes   watches diet . ED (erectile dysfunction) of  organic origin  . Gait disturbance, post-stroke   uses wheelchair long distance . Glaucoma, right eye  . History of bleeding peptic  ulcer   03/ 1979  s/p  surgery . History of concussion   1987 no loc-- no residual . History of CVA with residual deficit   05/ 2016 CYPTOGENIC CVA --- RESIDUAL GAIT DISTURBANCE AND SIALORRHEA ACCESS (DROOLING) . Hyperlipidemia  . Hypertension  . Mild atherosclerosis of both carotid arteries   per duplex from outside facitlity 04-08-2015 less then 50% stenosis bilateral ICA . Parkinson's disease [facility]) neurologist -- dr r. Mitzi Hansen falconer ([facility] [place])  dx 2015 . 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 PFO (patent foramen ovale)   per TEE 08-18-2016 . Speech impairment   post-stroke . Status post placement of implantable loop recorder 08/18/2016  placed by dr allred . Wears glasses  Past Surgical History: Past Surgical History: Procedure Laterality Date . APPENDECTOMY  1978 . CATARACT EXTRACTION W/ INTRAOCULAR LENS IMPLANT Right 2014 approx. . CYSTOSCOPY W/ URETERAL STENT PLACEMENT Bilateral 05/07/2017  Procedure: CYSTOSCOPY WITH RETROGRADE PYELOGRAM/URETERAL;  Surgeon: Cleon Gustin, MD;  Location: [facility];  Service: Urology;  Laterality: Bilateral; . EP IMPLANTABLE DEVICE N/A 08/18/2016  Procedure: Loop Recorder Insertion;  Surgeon: Thompson Grayer, MD;  Location: [facility];  Service: Cardiovascular;  Laterality: N/A; . REMOVAL NODULES BILATERAL FLANK  2008 approx.  per pt benign fatty tissue . REPAIR OF PERFORATED ULCER  01/1978  bleeding peptic ulcer . TEE WITHOUT CARDIOVERSION N/A 08/18/2016  Procedure: TRANSESOPHAGEAL ECHOCARDIOGRAM (TEE);  Surgeon: Sueanne Margarita, MD;  Location: [facility];  Service: Cardiovascular;  Laterality: N/A; no evidence thrombus;  mobile atrial septum w/ positive bubble study consistent with PFO/ trivial AR, mild TR . TRANSTHORACIC ECHOCARDIOGRAM  04/09/2015  ef  15-17%, grade 1 diastolic dysfunction/  mild AV sclerosis without stenosis/  mild TR,  trivial MR and PR . TRANSURETHRAL RESECTION OF BLADDER TUMOR N/A 05/07/2017  Procedure: TRANSURETHRAL RESECTION OF BLADDER TUMOR (TURBT);  Surgeon: Cleon Gustin, MD;  Location: [facility];  Service: Urology;  Laterality: N/A; . TRANSURETHRAL RESECTION OF BLADDER TUMOR N/A 06/18/2017  Procedure: TRANSURETHRAL RESECTION OF BLADDER TUMOR (TURBT);  Surgeon: Cleon Gustin, MD;  Location: [facility];  Service: Urology;  Laterality: N/A; . TRANSURETHRAL RESECTION OF BLADDER TUMOR N/A 09/11/2018  Procedure: TRANSURETHRAL RESECTION OF BLADDER TUMOR (TURBT);  Surgeon: Cleon Gustin, MD;  Location: [facility];  Service: Urology;  Laterality: N/A; . TRANSURETHRAL RESECTION OF PROSTATE  08-20-2013   at [facility] HPI: 78 year old male with a history of prior CVA, Parkinson's disease, hypertension, was brought to the hospital with increasing confusion, headache was noted to be febrile in the emergency room.  Lumbar puncture performed did not indicate any CNS infection.  Urinalysis indicated possible urinary infection.  CT scan of the abdomen pelvis indicated thickening around bladder versus prostate.  He has been started on broad-spectrum antibiotics.  We will follow-up urine culture. Pt reports difficulty with drinking water and shortness of breath following. BSE requested.  Subjective: "I sometimes cough when I drink water too fast." Assessment / Plan / Recommendation CHL IP CLINICAL IMPRESSIONS 01/15/2019 Clinical Impression Pt presents with min oropharyngeal phase dysphagia characterized by reduced oral bolus cohesiveness, piecemeal deglutition, mild premature spillage with cup and straw sips of thin liquids and swallow trigger at the level of the valleculae across textures and consistencies; Pt with a single episode of flash penetration during the swallow with sequential straw sips of thin liquids and no additional  penetration or aspiration observed. Pt with occasional mild/trace  BOT and pyriform residuals which cleared with secondary/repeat swallow. A small collection of barium filled near UES and cleared, bringing the possibility of a small Zenker's diverticulum, however no radiologist present to confirm and did not appear clinically significant (just something to note for future studies). The barium tablet was transiently delayed in the distal esophagus, however cleared with a bite of puree. Esophageal sweep was otherwise unremarkable. Recommend regular textures and thin liquids with standard aspiration and reflux precautions, also encourage Pt to take small sips of liquid given his subjective c/o occasional coughing while drinking water and shortness of breath. No further SLP services indicated at this time. Results and recommendations reviewed with the Pt and his wife in room following the MBSS.  SLP Visit Diagnosis Dysphagia, oropharyngeal phase (R13.12) Attention and concentration deficit following -- Frontal lobe and executive function deficit following -- Impact on safety and function Mild aspiration risk   CHL IP TREATMENT RECOMMENDATION 01/15/2019 Treatment Recommendations No treatment recommended at this time   Prognosis 01/15/2019 Prognosis for Safe Diet Advancement Good Barriers to Reach Goals -- Barriers/Prognosis Comment -- CHL IP DIET RECOMMENDATION 01/15/2019 SLP Diet Recommendations Regular solids;Thin liquid Liquid Administration via Cup;Straw Medication Administration Whole meds with liquid Compensations Small sips/bites Postural Changes Remain semi-upright after after feeds/meals (Comment);Seated upright at 90 degrees   CHL IP OTHER RECOMMENDATIONS 01/15/2019 Recommended Consults -- Oral Care Recommendations Oral care BID;Staff/trained caregiver to provide oral care Other Recommendations Clarify dietary restrictions   CHL IP FOLLOW UP RECOMMENDATIONS 01/15/2019 Follow up Recommendations None   CHL IP FREQUENCY  AND DURATION 01/15/2019 Speech Therapy Frequency (ACUTE ONLY) min 2x/week Treatment Duration 1 week      CHL IP ORAL PHASE 01/15/2019 Oral Phase Impaired Oral - Pudding Teaspoon -- Oral - Pudding Cup -- Oral - Honey Teaspoon -- Oral - Honey Cup -- Oral - Nectar Teaspoon -- Oral - Nectar Cup -- Oral - Nectar Straw -- Oral - Thin Teaspoon -- Oral - Thin Cup Premature spillage;Piecemeal swallowing;Decreased bolus cohesion Oral - Thin Straw Premature spillage Oral - Puree -- Oral - Mech Soft -- Oral - Regular Delayed oral transit;Lingual/palatal residue Oral - Multi-Consistency -- Oral - Pill -- Oral Phase - Comment --  CHL IP PHARYNGEAL PHASE 01/15/2019 Pharyngeal Phase Impaired Pharyngeal- Pudding Teaspoon -- Pharyngeal -- Pharyngeal- Pudding Cup -- Pharyngeal -- Pharyngeal- Honey Teaspoon -- Pharyngeal -- Pharyngeal- Honey Cup -- Pharyngeal -- Pharyngeal- Nectar Teaspoon -- Pharyngeal -- Pharyngeal- Nectar Cup -- Pharyngeal -- Pharyngeal- Nectar Straw -- Pharyngeal -- Pharyngeal- Thin Teaspoon Delayed swallow initiation-vallecula;WFL Pharyngeal -- Pharyngeal- Thin Cup Delayed swallow initiation-vallecula Pharyngeal -- Pharyngeal- Thin Straw Delayed swallow initiation-vallecula;Penetration/Aspiration during swallow;Pharyngeal residue - pyriform Pharyngeal Material does not enter airway;Material enters airway, remains ABOVE vocal cords then ejected out Pharyngeal- Puree Delayed swallow initiation-vallecula;WFL;Pharyngeal residue - valleculae;Pharyngeal residue - cp segment Pharyngeal -- Pharyngeal- Mechanical Soft -- Pharyngeal -- Pharyngeal- Regular Delayed swallow initiation-vallecula Pharyngeal -- Pharyngeal- Multi-consistency -- Pharyngeal -- Pharyngeal- Pill WFL Pharyngeal -- Pharyngeal Comment --  CHL IP CERVICAL ESOPHAGEAL PHASE 01/15/2019 Cervical Esophageal Phase WFL Pudding Teaspoon -- Pudding Cup -- Honey Teaspoon -- Honey Cup -- Nectar Teaspoon -- Nectar Cup -- Nectar Straw -- Thin Teaspoon -- Thin Cup -- Thin  Straw -- Puree -- Mechanical Soft -- Regular -- Multi-consistency -- Pill -- Cervical Esophageal Comment -- Thank you, Genene Churn, [place] 01/15/2019, 1:02 PM                   Scheduled Meds: .  atorvastatin  40 mg Oral QPM  . Carbidopa-Levodopa ER  3 capsule Oral QID  . clopidogrel  75 mg Oral Daily  . Dextromethorphan-quiNIDine  1 capsule Oral BID  . finasteride  5 mg Oral Daily  . lidocaine (PF)  5 mL Intradermal Once  . metoprolol succinate  50 mg Oral QPM  . phenazopyridine  100 mg Oral TID WC   Continuous Infusions: . sodium chloride Stopped (01/15/19 0334)  . piperacillin-tazobactam (ZOSYN)  IV 3.375 g (01/15/19 1820)     LOS: 2 days    Time spent: 37mins    Kathie Dike, MD [facility]   If 7PM-7AM, please contact night-coverage [email or web address]  01/15/2019, 6:38 PM

## 2019-01-15 NOTE — Progress Notes (Signed)
Modified Barium Swallow Progress Note  Patient Details  Name: Mike Nixon MRN: [id number] Date of Birth: 1941-03-19  Today's Date: 01/15/2019  Modified Barium Swallow completed.  Full report located under Chart Review in the Imaging Section.  Brief recommendations include the following:  Clinical Impression  Pt presents with min oropharyngeal phase dysphagia characterized by reduced oral bolus cohesiveness, piecemeal deglutition, mild premature spillage with cup and straw sips of thin liquids and swallow trigger at the level of the valleculae across textures and consistencies; Pt with a single episode of flash penetration during the swallow with sequential straw sips of thin liquids and no additional penetration or aspiration observed. Pt with occasional mild/trace BOT and pyriform residuals which cleared with secondary/repeat swallow. A small collection of barium filled near UES and cleared, bringing the possibility of a small Zenker's diverticulum, however no radiologist present to confirm and did not appear clinically significant (just something to note for future studies). The barium tablet was transiently delayed in the distal esophagus, however cleared with a bite of puree. Esophageal sweep was otherwise unremarkable. Recommend regular textures and thin liquids with standard aspiration and reflux precautions, also encourage Pt to take small sips of liquid given his subjective c/o occasional coughing while drinking water and shortness of breath. No further SLP services indicated at this time. Results and recommendations reviewed with the Pt and his wife in room following the MBSS.    Swallow Evaluation Recommendations       SLP Diet Recommendations: Regular solids;Thin liquid   Liquid Administration via: Cup;Straw(encourage small sips)   Medication Administration: Whole meds with liquid   Supervision: Patient able to self feed;Intermittent supervision to cue for compensatory  strategies   Compensations: Small sips/bites   Postural Changes: Remain semi-upright after after feeds/meals (Comment);Seated upright at 90 degrees   Oral Care Recommendations: Oral care BID;Staff/trained caregiver to provide oral care   Other Recommendations: Clarify dietary restrictions   Thank you,  Genene Churn, [place] 01/15/2019,12:45 PM

## 2019-01-15 NOTE — Evaluation (Signed)
Clinical/Bedside Swallow Evaluation Patient Details  Name: Mike Nixon MRN: [id number] Date of Birth: [birth year]  Today's Date: 01/15/2019 Time: SLP Start Time (ACUTE ONLY): 0900 SLP Stop Time (ACUTE ONLY): 0928 SLP Time Calculation (min) (ACUTE ONLY): 28 min  Past Medical History:  Past Medical History:  Diagnosis Date  . Anticoagulant long-term use    PLAVIX  . Benign localized prostatic hyperplasia with lower urinary tract symptoms (LUTS)   . Bladder tumor   . Borderline diabetes    watches diet  . ED (erectile dysfunction) of organic origin   . Gait disturbance, post-stroke    uses wheelchair long distance  . Glaucoma, right eye   . History of bleeding peptic ulcer    03/ 1979  s/p  surgery  . History of concussion    1987 no loc-- no residual  . History of CVA with residual deficit    05/ 2016 CYPTOGENIC CVA --- RESIDUAL GAIT DISTURBANCE AND SIALORRHEA ACCESS (DROOLING)  . Hyperlipidemia   . Hypertension   . Mild atherosclerosis of both carotid arteries    per duplex from outside facitlity 04-08-2015 less then 50% stenosis bilateral ICA  . Parkinson's disease [facility]) neurologist -- dr r. Mitzi Hansen falconer ([facility], [facility])   dx 2015  . PBA (pseudobulbar affect)    laughter-- residual from cva 05/ 2016  . PFO (patent foramen ovale)    per TEE 08-18-2016  . Speech impairment    post-stroke  . Status post placement of implantable loop recorder 08/18/2016   placed by dr allred  . Wears glasses    Past Surgical History:  Past Surgical History:  Procedure Laterality Date  . APPENDECTOMY  1978  . CATARACT EXTRACTION W/ INTRAOCULAR LENS IMPLANT Right 2014 approx.  . CYSTOSCOPY W/ URETERAL STENT PLACEMENT Bilateral 05/07/2017   Procedure: CYSTOSCOPY WITH RETROGRADE PYELOGRAM/URETERAL;  Surgeon: Cleon Gustin, MD;  Location: [facility];  Service: Urology;  Laterality: Bilateral;  . EP IMPLANTABLE DEVICE N/A 08/18/2016   Procedure: Loop Recorder Insertion;  Surgeon: Thompson Grayer, MD;  Location: [facility];  Service: Cardiovascular;  Laterality: N/A;  . REMOVAL NODULES BILATERAL FLANK  2008 approx.   per pt benign fatty tissue  . REPAIR OF PERFORATED ULCER  01/1978   bleeding peptic ulcer  . TEE WITHOUT CARDIOVERSION N/A 08/18/2016   Procedure: TRANSESOPHAGEAL ECHOCARDIOGRAM (TEE);  Surgeon: Sueanne Margarita, MD;  Location: [facility];  Service: Cardiovascular;  Laterality: N/A; no evidence thrombus;  mobile atrial septum w/ positive bubble study consistent with PFO/ trivial AR, mild TR  . TRANSTHORACIC ECHOCARDIOGRAM  04/09/2015   ef 99-35%, grade 1 diastolic dysfunction/  mild AV sclerosis without stenosis/  mild TR,  trivial MR and PR  . TRANSURETHRAL RESECTION OF BLADDER TUMOR N/A 05/07/2017   Procedure: TRANSURETHRAL RESECTION OF BLADDER TUMOR (TURBT);  Surgeon: Cleon Gustin, MD;  Location: [facility];  Service: Urology;  Laterality: N/A;  . TRANSURETHRAL RESECTION OF BLADDER TUMOR N/A 06/18/2017   Procedure: TRANSURETHRAL RESECTION OF BLADDER TUMOR (TURBT);  Surgeon: Cleon Gustin, MD;  Location: [facility];  Service: Urology;  Laterality: N/A;  . TRANSURETHRAL RESECTION OF BLADDER TUMOR N/A 09/11/2018   Procedure: TRANSURETHRAL RESECTION OF BLADDER TUMOR (TURBT);  Surgeon: Cleon Gustin, MD;  Location: [facility];  Service: Urology;  Laterality: N/A;  . TRANSURETHRAL RESECTION OF PROSTATE  08-20-2013   at [facility]   HPI:  78 year old male with a history of prior CVA, Parkinson's disease,  hypertension, was brought to the hospital with increasing confusion, headache was noted to be febrile in the emergency room.  Lumbar puncture performed did not indicate any CNS infection.  Urinalysis indicated possible urinary infection.  CT scan of the abdomen pelvis indicated thickening around bladder versus prostate.  He has been started on broad-spectrum antibiotics.  We will follow-up urine  culture. Pt reports difficulty with drinking water and shortness of breath following. BSE requested.   Assessment / Plan / Recommendation Clinical Impression  Clinical swallow evaluation completed in room while Pt seated upright in chair with breakfast tray. Oral motor examination is WNL. Pt's speech is slightly dysarthric, however also influenced by accent ([place] per Pt). Pt without overt signs or symptoms of aspiration with consistencies and textures presented, however given h/o CVA and PD, occasional coughing with thin liquids, and SOB following meals, will proceed with MBSS this date to objectively evaluate swallow. Results to follow.  SLP Visit Diagnosis: Dysphagia, unspecified (R13.10)    Aspiration Risk  Mild aspiration risk    Diet Recommendation Regular;Thin liquid   Liquid Administration via: Cup;Straw Medication Administration: Whole meds with liquid Supervision: Patient able to self feed;Intermittent supervision to cue for compensatory strategies Postural Changes: Seated upright at 90 degrees;Remain upright for at least 30 minutes after po intake    Other  Recommendations Oral Care Recommendations: Oral care BID;Staff/trained caregiver to provide oral care Other Recommendations: Clarify dietary restrictions   Follow up Recommendations (pending)      Frequency and Duration min 2x/week  1 week       Prognosis Prognosis for Safe Diet Advancement: Good      Swallow Study   General Date of Onset: 01/13/19 HPI: 78 year old male with a history of prior CVA, Parkinson's disease, hypertension, was brought to the hospital with increasing confusion, headache was noted to be febrile in the emergency room.  Lumbar puncture performed did not indicate any CNS infection.  Urinalysis indicated possible urinary infection.  CT scan of the abdomen pelvis indicated thickening around bladder versus prostate.  He has been started on broad-spectrum antibiotics.  We will follow-up urine  culture. Pt reports difficulty with drinking water and shortness of breath following. BSE requested. Type of Study: Bedside Swallow Evaluation Previous Swallow Assessment: None on record, but Pt reports MBS at outside hospital over a year ago Diet Prior to this Study: Regular;Thin liquids Temperature Spikes Noted: No Respiratory Status: Room air History of Recent Intubation: No Behavior/Cognition: Alert;Cooperative;Pleasant mood Oral Cavity Assessment: Within Functional Limits Oral Care Completed by SLP: No Oral Cavity - Dentition: Adequate natural dentition Vision: Functional for self-feeding Self-Feeding Abilities: Able to feed self Patient Positioning: Upright in chair Baseline Vocal Quality: Normal Volitional Cough: Strong Volitional Swallow: Able to elicit    Oral/Motor/Sensory Function Overall Oral Motor/Sensory Function: Within functional limits   Ice Chips Ice chips: Within functional limits Presentation: Spoon   Thin Liquid Thin Liquid: Within functional limits Presentation: Cup;Self Fed;Straw    Nectar Thick Nectar Thick Liquid: Not tested   Honey Thick Honey Thick Liquid: Not tested   Puree Puree: Within functional limits Presentation: Spoon   Solid     Solid: Within functional limits Presentation: Spoon     Thank you,  Genene Churn, [place]  PORTER,DABNEY 01/15/2019,10:58 AM

## 2019-01-15 NOTE — Progress Notes (Signed)
Pharmacy Antibiotic Note  Mike Nixon is a 78 y.o. male admitted on 01/12/2019 with sepsis.  Pharmacy has been consulted for  Zosyn dosing. Patient improving,  Plan: Continue Zosyn 3.375g IV every 8 hours. Monitor labs, c/s F/u transition to po tx  Height: 5\' 7"  (170.2 cm) Weight: 176 lb 12.9 oz (80.2 kg) IBW/kg (Calculated) : 66.1  Temp (24hrs), Avg:98.3 F (36.8 C), Min:97.9 F (36.6 C), Max:98.7 F (37.1 C)  Recent Labs  Lab 01/12/19 1242 01/12/19 1258 01/12/19 1441 01/13/19 0744 01/14/19 0417 01/15/19 0410  WBC 14.4*  --   --  11.1* 8.3 6.9  CREATININE 1.31*  --   --  1.27* 1.09 1.02  LATICACIDVEN  --  2.3* 1.7  --   --   --     Estimated Creatinine Clearance: 62.5 mL/min (by C-G formula based on SCr of 1.02 mg/dL).    Allergies  Allergen Reactions  . Benazepril Cough  . Doxycycline Other (See Comments)    Unknown  . Levofloxacin Hives and Swelling  . Simvastatin Other (See Comments)    Pt does not remember what the reaction was. ?muscle aches  . Tamsulosin Hives and Swelling  . Tramadol Hcl Hives and Swelling  . Sulfa Antibiotics Hives, Swelling, Rash and Other (See Comments)    Severe rash    Antimicrobials this admission: Vanco 2/16 >>  Zosy 2/16 >>   Dose adjustments this admission: N/A  Microbiology results: 2/16 BCx: ngtd 2/16 urince cx: Pseudomonas 20,000CFU/ml s-zosyn, cipro                           Enterobacter cloacae: 50,000CFU/ml s- zosyn, cipro   Thank you for allowing pharmacy to be a part of this patient's care.  Isac Sarna, BS [place], [place] Clinical Pharmacist Pager [phone] 01/15/2019 1:03 PM

## 2019-01-15 NOTE — Progress Notes (Signed)
PT Cancellation Note  Patient Details Name: Mike Nixon MRN: [id number] DOB: [birth year]   Cancelled Treatment:    Reason Eval/Treat Not Completed: Patient declined, no reason specified Attempted PT session, pt declined stated he is awaiting procedures including swallow study.  [address] LPTA; [place]  Aldona Lento 01/15/2019, 11:17 AM

## 2019-01-16 DIAGNOSIS — E785 Hyperlipidemia, unspecified: Secondary | ICD-10-CM

## 2019-01-16 LAB — CSF CULTURE

## 2019-01-16 LAB — CSF CULTURE W GRAM STAIN
Culture: NO GROWTH
Special Requests: NORMAL

## 2019-01-16 MED ORDER — FINASTERIDE 5 MG PO TABS: 5.0000 mg | ORAL_TABLET | Freq: Every day | ORAL | Status: DC

## 2019-01-16 MED ORDER — CIPROFLOXACIN HCL 250 MG PO TABS: 250.0000 mg | ORAL_TABLET | Freq: Two times a day (BID) | ORAL | 0 refills | Status: AC

## 2019-01-16 MED ORDER — OXYBUTYNIN CHLORIDE 5 MG PO TABS: 5.0000 mg | ORAL_TABLET | Freq: Three times a day (TID) | ORAL | Status: DC | PRN

## 2019-01-16 MED ORDER — CIPROFLOXACIN HCL 250 MG PO TABS: 250.0000 mg | ORAL_TABLET | Freq: Two times a day (BID) | ORAL | 0 refills | Status: DC

## 2019-01-16 MED ORDER — METOPROLOL SUCCINATE ER 50 MG PO TB24: 50.0000 mg | ORAL_TABLET | Freq: Every evening | ORAL | Status: DC

## 2019-01-16 MED ORDER — METOPROLOL SUCCINATE ER 50 MG PO TB24: 50.0000 mg | ORAL_TABLET | Freq: Every evening | ORAL | 0 refills | Status: DC

## 2019-01-16 MED ORDER — CLOPIDOGREL BISULFATE 75 MG PO TABS: 75.0000 mg | ORAL_TABLET | Freq: Every day | ORAL | Status: AC

## 2019-01-16 NOTE — Discharge Instructions (Signed)
Please follow up with urologist Dr. Alyson Ingles in 2 weeks time.

## 2019-01-16 NOTE — Progress Notes (Signed)
01/16/2019 5:37 PM  Pt now refusing SNF after all arrangements had been made for him to go to SNF. Home health orders placed.  Pt high risk for readmission.  Explained risks to patient and family who verbalized understanding.    Murvin Natal MD

## 2019-01-16 NOTE — Clinical Social Work Note (Signed)
Patient's son arrived to transport patient to SNF and patient is refusing SNF stating that he wants to go home for a night or two and then admit in to SNF. Patient was advised that his authorization started today and that he had to admit to SNF today. Patient's wife, son and daughter all continue to encourage him to go to SNF for additional daily rehab.    LCSW messaged attending to place [facility] orders (RN, aide and PT).    LCSW encouraged family to continue to speak with patient about the benefits of SNF and encouraged patient to admit this evening.   Nyshaun Standage, Clydene Pugh, LCSW

## 2019-01-16 NOTE — Progress Notes (Signed)
IV removed, patient tolerated well. Report called to [place], LPN at [facility] in [place], [place].  Patient's son arrived to pick up patient and patient was refusing to go to Rehab.  Notified Ambrose Pancoast, LCSW, who spoke with patient's son about getting home health set up. Patient's son took patient home, patient's family will call [facility] to let them know if whether or not patient will go to the facility if patient desires. Called Lenna Sciara, LPN at [facility] to inform that family will be calling later to notify if patient is coming or not.

## 2019-01-16 NOTE — Clinical Social Work Note (Signed)
Mike Nixon from [facility] contacted LCSW and advised that authorization is pending with [employer].  Mike Nixon will contact LCSW when authorization is received.     Christabell Loseke, Clydene Pugh, LCSW

## 2019-01-16 NOTE — Clinical Social Work Placement (Signed)
   CLINICAL SOCIAL WORK PLACEMENT  NOTE  Date:  01/16/2019  Patient Details  Name: Mike Nixon MRN: [id number] Date of Birth: March 03, 1941  Clinical Social Work is seeking post-discharge placement for this patient at the [place] level of care (*CSW will initial, date and re-position this form in  chart as items are completed):  Yes   Patient/family provided with [place] Work Department's list of facilities offering this level of care within the geographic area requested by the patient (or if unable, by the patient's family).  Yes   Patient/family informed of their freedom to choose among providers that offer the needed level of care, that participate in Medicare, Medicaid or managed care program needed by the patient, have an available bed and are willing to accept the patient.  Yes   Patient/family informed of [facility]'s ownership interest in [facility] and [facility], as well as of the fact that they are under no obligation to receive care at these facilities.  PASRR submitted to EDS on (n/a due to [place] residency status)     PASRR number received on       Existing PASRR number confirmed on       FL2 transmitted to all facilities in geographic area requested by pt/family on 01/14/19     FL2 transmitted to all facilities within larger geographic area on       Patient informed that his/her managed care company has contracts with or will negotiate with certain facilities, including the following:        Yes   Patient/family informed of bed offers received.  Patient chooses bed at [facility]     Physician recommends and patient chooses bed at      Patient to be transferred to [facility] on 01/16/19.  Patient to be transferred to facility by Son (per wife)     Patient family notified on 01/16/19 of transfer.  Name of family member notified:  wife     PHYSICIAN       Additional  Comment:  Discharge clinicals sent. Facility aware of discharge. LCSW signing off.   _______________________________________________ Ihor Gully, LCSW 01/16/2019, 3:52 PM

## 2019-01-16 NOTE — Care Management Important Message (Signed)
Important Message  Patient Details  Name: Mike Nixon MRN: [id number] Date of Birth: Apr 14, 1941   Medicare Important Message Given:  Yes    Sherald Barge, RN 01/16/2019, 12:49 PM

## 2019-01-16 NOTE — Discharge Summary (Signed)
Physician Discharge Summary  Alyssa Mancera NOB:[id number] DOB: [birth year] DOA: 01/12/2019  PCP: [name], MD Urology: McKenzie  Admit date: 01/12/2019 Discharge date: 01/16/2019  Disposition:  SNF   Recommendations for Outpatient Follow-up:  1. Follow up with urologist in 2 weeks (to be considered for prostatectomy) 2. Monitor for any signs of drug reaction or allergy to ciprofloxacin tablets  Discharge Condition: STABLE   CODE STATUS: FULL    Brief Hospitalization Summary: Please see all hospital notes, images, labs for full details of the hospitalization. HPI: Sahand Gosch is a 78 y.o. 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  Patient has also been complaining of a headache for the last day or 2.  He also tells me that he has been having generalized abdominal pain since Friday.  He denies any nausea or vomiting, and family states that he has been eating without difficulties, but did have a poor appetite over the last couple of days.  He denies any diarrhea.  He denies any neck pain but does complain of headache.  Denies any photophobia.  He denies any chest pain or shortness of breath.  Wife also tells me that he has been complaining of difficulties urinating since last night, feels the urge to go but cannot.  Family also reports that he has been diagnosed and treated for pneumonia about 6 weeks ago.  He denies any chest congestion or cough.  He denies any flulike illness.  ED Course: In the ED he was found to be febrile to 102, due to headache patient underwent an LP and he was given meningitis antibiotics, showing a glucose of 87, normal protein and 1 WBC. His blood work shows mildly elevated creatinine of 1.3, glucose 147, initial lactic acid was 2.3 and  improved to 1.7 after fluids, he has a white count of 14.4.  Influenza was checked and was negative.  Urinalysis with rare bacteria and 11-20 WBC.  Chest x-ray stable without acute cardiopulmonary disease, CT scan of the head without any acute findings.  We are asked to admit for SIRS with unclear source.  Brief Narrative:  78 year old male with a history of prior CVA, Parkinson's disease, hypertension, was brought to the hospital with increasing confusion, headache was noted to be febrile in the emergency room.  Lumbar puncture performed did not indicate any CNS infection.  Urinalysis indicated possible urinary infection.  CT scan of the abdomen pelvis indicated thickening around bladder versus prostate.  He has been started on broad-spectrum antibiotics.  Assessment & Plan:   Active Problems:   Parkinson's disease ([place])   Benign essential HTN   HLD (hyperlipidemia)   Sepsis ([facility])   BPH (benign prostatic hyperplasia)   Acute lower UTI   1. Sepsis, related to urinary tract infection.  Urine culture positive for Pseudomonas and enterobacter both highly sensitive to ciprofloxacin.  He was febrile on admission, but with antibiotic therapy this appears to have improved.  He was treated with IV Zosyn.  CT of the abdomen pelvis also indicated possible urinary source.  Postvoid residual bladder scan checked and did not show any urinary retention.  Case discussed with Dr. Alyson Ingles who is patient's primary urologist with no further recommendations.  He will need to follow-up with urology as an outpatient to be considered for prostatectomy.  He will be  discharged on oral ciprofloxacin based on urine culture results.  Please monitor patient closely for any signs of drug reaction from ciprofloxacin.  2. Headache.  Resolved now.  Unclear etiology.  CT scan of the head is unremarkable. Patient also had lumbar puncture done which did not show any signs of infection.  Of note, he had headache prior to lumbar  puncture.  Does not have any photophobia or changes in vision.  Does not have any tenderness with palpation over his temples.  This is resolved with Toradol. 3. Dysphagia. MBS with mild aspiration risk, continue regular, thin per SLP team.  His aspiration risk if relatively low.   4. Prior CVA.  He has residual right-sided weakness.  Continue on Plavix for secondary prevention. 5. Hypertension.  Blood pressures currently stable. 6. Parkinson's disease.  Continue home medications. 7. Hyperlipidemia.  Continue statin. 8. Recurrent falls at home.  Seen by physical therapy with recommendations for skilled nursing facility placement.   DVT prophylaxis: SCDs Code Status: Full code Family Communication: Discussed with wife at the bedside Disposition Plan: Skilled nursing facility  Consultants:     Procedures:     Antimicrobials:   Vancomycin 2/16 >2/18  Zosyn 2/16 >> 2/20   Discharge Diagnoses:  Active Problems:   Parkinson's disease ([place])   Benign essential HTN   HLD (hyperlipidemia)   Sepsis (HCC)   BPH (benign prostatic hyperplasia)   Acute lower UTI  Discharge Instructions: Discharge Instructions    Call MD for:  difficulty breathing, headache or visual disturbances   Complete by:  As directed    Call MD for:  extreme fatigue   Complete by:  As directed    Call MD for:  persistant dizziness or light-headedness   Complete by:  As directed    Call MD for:  persistant nausea and vomiting   Complete by:  As directed    Call MD for:  temperature >100.4   Complete by:  As directed    Diet - low sodium heart healthy   Complete by:  As directed    Increase activity slowly   Complete by:  As directed      Allergies as of 01/16/2019      Reactions   Benazepril Cough   Doxycycline Other (See Comments)   Unknown   Levofloxacin Hives, Swelling   Simvastatin Other (See Comments)   Pt does not remember what the reaction was. ?muscle aches   Tamsulosin Hives, Swelling    Tramadol Hcl Hives, Swelling   Sulfa Antibiotics Hives, Swelling, Rash, Other (See Comments)   Severe rash      Medication List    TAKE these medications   atorvastatin 40 MG tablet Commonly known as:  LIPITOR Take 40 mg by mouth every evening.   ciprofloxacin 250 MG tablet Commonly known as:  CIPRO Take 1 tablet (250 mg total) by mouth 2 (two) times daily for 5 days. Start taking on:  January 17, 2019   clopidogrel 75 MG tablet Commonly known as:  PLAVIX Take 1 tablet (75 mg total) by mouth daily. What changed:  additional instructions   finasteride 5 MG tablet Commonly known as:  PROSCAR Take 1 tablet (5 mg total) by mouth daily.   furosemide 20 MG tablet Commonly known as:  LASIX Take 1 tablet (20 mg total) by mouth as needed. What changed:    when to take this  reasons to take this   Istradefylline 20 MG Tabs Take 20 mg by mouth every  morning. NOURIANZ   meclizine 25 MG tablet Commonly known as:  ANTIVERT Take 25 mg by mouth 3 (three) times daily as needed for dizziness.   metoprolol succinate 50 MG 24 hr tablet Commonly known as:  TOPROL-XL Take 1 tablet (50 mg total) by mouth every evening. What changed:    medication strength  how much to take   NUEDEXTA 20-10 MG capsule Generic drug:  Dextromethorphan-quiNIDine Take 1 capsule by mouth 2 (two) times daily.   oxybutynin 5 MG tablet Commonly known as:  DITROPAN Take 1 tablet (5 mg total) by mouth every 8 (eight) hours as needed for bladder spasms.   RYTARY 36.25-145 MG Cpcr Generic drug:  Carbidopa-Levodopa ER Take 3 capsules by mouth 4 (four) times daily.       Contact information for follow-up providers    McKenzie, Candee Furbish, MD. Schedule an appointment as soon as possible for a visit in 2 week(s).   Specialty:  Urology Why:  Hospital Follow Up  Contact information: [address] [place] [address] [phone]            Contact information for after-discharge care     [facility] .   Service:  Skilled Nursing Contact information: [place] [address] [phone]                 Allergies  Allergen Reactions  . Benazepril Cough  . Doxycycline Other (See Comments)    Unknown  . Levofloxacin Hives and Swelling  . Simvastatin Other (See Comments)    Pt does not remember what the reaction was. ?muscle aches  . Tamsulosin Hives and Swelling  . Tramadol Hcl Hives and Swelling  . Sulfa Antibiotics Hives, Swelling, Rash and Other (See Comments)    Severe rash   Allergies as of 01/16/2019      Reactions   Benazepril Cough   Doxycycline Other (See Comments)   Unknown   Levofloxacin Hives, Swelling   Simvastatin Other (See Comments)   Pt does not remember what the reaction was. ?muscle aches   Tamsulosin Hives, Swelling   Tramadol Hcl Hives, Swelling   Sulfa Antibiotics Hives, Swelling, Rash, Other (See Comments)   Severe rash      Medication List    TAKE these medications   atorvastatin 40 MG tablet Commonly known as:  LIPITOR Take 40 mg by mouth every evening.   ciprofloxacin 250 MG tablet Commonly known as:  CIPRO Take 1 tablet (250 mg total) by mouth 2 (two) times daily for 5 days. Start taking on:  January 17, 2019   clopidogrel 75 MG tablet Commonly known as:  PLAVIX Take 1 tablet (75 mg total) by mouth daily. What changed:  additional instructions   finasteride 5 MG tablet Commonly known as:  PROSCAR Take 1 tablet (5 mg total) by mouth daily.   furosemide 20 MG tablet Commonly known as:  LASIX Take 1 tablet (20 mg total) by mouth as needed. What changed:    when to take this  reasons to take this   Istradefylline 20 MG Tabs Take 20 mg by mouth every morning. NOURIANZ   meclizine 25 MG tablet Commonly known as:  ANTIVERT Take 25 mg by mouth 3 (three) times daily as needed for dizziness.   metoprolol succinate 50 MG 24 hr tablet Commonly known  as:  TOPROL-XL Take 1 tablet (50 mg total) by mouth every evening. What changed:  medication strength  how much to take   NUEDEXTA 20-10 MG capsule Generic drug:  Dextromethorphan-quiNIDine Take 1 capsule by mouth 2 (two) times daily.   oxybutynin 5 MG tablet Commonly known as:  DITROPAN Take 1 tablet (5 mg total) by mouth every 8 (eight) hours as needed for bladder spasms.   RYTARY 36.25-145 MG Cpcr Generic drug:  Carbidopa-Levodopa ER Take 3 capsules by mouth 4 (four) times daily.       Procedures/Studies: Dg Shoulder Right  Result Date: 01/12/2019 CLINICAL DATA:  Shoulder and bilateral knee pain after falling today. EXAM: RIGHT SHOULDER - 2+ VIEW COMPARISON:  None. FINDINGS: AP and Y-views are submitted. There is no axillary view. The mineralization and alignment are normal. There is no evidence of acute fracture or dislocation. The subacromial space is preserved. There are mild acromioclavicular degenerative changes. IMPRESSION: No evidence of acute fracture or dislocation. Electronically Signed   By: Richardean Sale M.D.   On: 01/12/2019 16:16   Ct Head Wo Contrast  Result Date: 01/12/2019 CLINICAL DATA:  Bad headache for 3 days. EXAM: CT HEAD WITHOUT CONTRAST TECHNIQUE: Contiguous axial images were obtained from the base of the skull through the vertex without intravenous contrast. COMPARISON:  July 09, 2016 FINDINGS: Brain: No subdural, epidural, or subarachnoid hemorrhage. Ventricles and sulci are normal. Cerebellum demonstrates no acute abnormalities. Brainstem and basal cisterns are normal. No mass effect or midline shift. No acute cortical ischemia or infarct. Mild white matter changes. Vascular: No hyperdense vessel or unexpected calcification. Skull: Normal. Negative for fracture or focal lesion. Sinuses/Orbits: No acute finding. Other: None. IMPRESSION: 1. No acute intracranial abnormalities to explain the patient's headache. Electronically Signed   By: Dorise Bullion  III M.D   On: 01/12/2019 15:03   Ct Abdomen Pelvis W Contrast  Result Date: 01/12/2019 CLINICAL DATA:  Abdominal pain, headache. Fever, abscess suspected. EXAM: CT ABDOMEN AND PELVIS WITH CONTRAST TECHNIQUE: Multidetector CT imaging of the abdomen and pelvis was performed using the standard protocol following bolus administration of intravenous contrast. CONTRAST:  168mL OMNIPAQUE IOHEXOL 300 MG/ML  SOLN COMPARISON:  CT abdomen dated 10/11/2018. FINDINGS: Lower chest: No acute abnormality. Hepatobiliary: No focal liver abnormality is seen. No gallstones, gallbladder wall thickening, or biliary dilatation. Pancreas: Unremarkable. No pancreatic ductal dilatation or surrounding inflammatory changes. Spleen: Normal in size without focal abnormality. Adrenals/Urinary Tract: Adrenal glands appear normal. Kidneys are unremarkable without suspicious mass, stone or hydronephrosis. No ureteral or bladder calculi appreciated. Bladder is decompressed by Foley catheter. Stomach/Bowel: No dilated large or small bowel loops. No definitive evidence of bowel wall inflammation or mesenteric inflammation. There is new mild inflammation/fluid stranding within the presacral space of the lower pelvis, of uncertain etiology. Vascular/Lymphatic: No significant vascular findings are present. No enlarged abdominal or pelvic lymph nodes. Reproductive: Prostate gland is enlarged. Other: No abscess collection. No free intraperitoneal air. Musculoskeletal: Degenerative spondylosis throughout the thoracolumbar spine, mild to moderate in degree. No acute or suspicious osseous finding. IMPRESSION: 1. New mild inflammation/fluid stranding within the presacral space of the lower pelvis. This is of uncertain etiology but given its proximity to the rectum and bladder could be sequela of either proctocolitis, prostatitis or bladder infection. 2. Prostate gland is enlarged. 3. Bladder decompressed by Foley catheter. 4. Remainder of the abdomen and  pelvis CT is unremarkable, as detailed above. No bowel obstruction. No abscess collection or free intraperitoneal air. No evidence of pyelonephritis. Electronically Signed   By: Franki Cabot M.D.   On: 01/12/2019 17:40  Dg Chest Port 1 View  Result Date: 01/12/2019 CLINICAL DATA:  Had not acute fever and acute mental status changes. Current history of bladder cancer. EXAM: PORTABLE CHEST 1 VIEW COMPARISON:  11/25/2018 and earlier. FINDINGS: Suboptimal inspiration accounts for crowded bronchovascular markings, especially in the bases, and accentuates the cardiac silhouette. Taking this into account, cardiac silhouette mildly enlarged, unchanged. Pulmonary venous hypertension without overt edema. Lungs clear. No visible pleural effusions. Implantable cardiac recording device overlying the LEFT side of the chest. IMPRESSION: Suboptimal inspiration. Stable mild cardiomegaly. No acute cardiopulmonary disease. Electronically Signed   By: Evangeline Dakin M.D.   On: 01/12/2019 13:53   Dg Knee Complete 4 Views Left  Result Date: 01/12/2019 CLINICAL DATA:  Fall this morning.  Bilateral knee pain. EXAM: LEFT KNEE - COMPLETE 4+ VIEW COMPARISON:  Radiographs 04/17/2018. FINDINGS: The mineralization and alignment are normal. There is no evidence of acute fracture or dislocation. The joint spaces are preserved. There is stable spurring at the quadriceps insertion on the patella. Previously noted pretibial soft tissue swelling has resolved. There is no foreign body or significant joint effusion. Femoropopliteal atherosclerosis noted. IMPRESSION: No acute osseous findings.  Diffuse vascular calcifications. Electronically Signed   By: Richardean Sale M.D.   On: 01/12/2019 16:20   Dg Knee Complete 4 Views Right  Result Date: 01/12/2019 CLINICAL DATA:  Fall this morning.  Bilateral knee pain. EXAM: RIGHT KNEE - COMPLETE 4+ VIEW COMPARISON:  None. FINDINGS: The mineralization and alignment are normal. There is no evidence  of acute fracture or dislocation. The joint spaces are preserved. No significant joint effusion. There is mild patellar spurring. There is diffuse femoropopliteal atherosclerosis. IMPRESSION: No acute osseous findings.  Diffuse vascular calcifications. Electronically Signed   By: Richardean Sale M.D.   On: 01/12/2019 16:17   Dg Swallowing Func-speech Pathology  Result Date: 01/15/2019 Objective Swallowing Evaluation: Type of Study: MBS-Modified Barium Swallow Study  Patient Details Name: Tyvon Eggenberger MRN: [id number] Date of Birth: 1941/11/09 Today's Date: 01/15/2019 Time: SLP Start Time (ACUTE ONLY): 1110 -SLP Stop Time (ACUTE ONLY): 1139 SLP Time Calculation (min) (ACUTE ONLY): 29 min Past Medical History: Past Medical History: Diagnosis Date . Anticoagulant long-term use   PLAVIX . Benign localized prostatic hyperplasia with lower urinary tract symptoms (LUTS)  . Bladder tumor  . Borderline diabetes   watches diet . ED (erectile dysfunction) of organic origin  . Gait disturbance, post-stroke   uses wheelchair long distance . Glaucoma, right eye  . History of bleeding peptic ulcer   03/ 1979  s/p  surgery . History of concussion   1987 no loc-- no residual . History of CVA with residual deficit   05/ 2016 CYPTOGENIC CVA --- RESIDUAL GAIT DISTURBANCE AND SIALORRHEA ACCESS (DROOLING) . Hyperlipidemia  . Hypertension  . Mild atherosclerosis of both carotid arteries   per duplex from outside facitlity 04-08-2015 less then 50% stenosis bilateral ICA . Parkinson's disease [facility]) neurologist -- dr r. Mitzi Hansen falconer ([facility], [place])  dx 2015 . 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 PFO (patent foramen ovale)   per TEE 08-18-2016 . Speech impairment   post-stroke . Status post placement of implantable loop recorder 08/18/2016  placed by dr allred . Wears glasses  Past Surgical History: Past Surgical History: Procedure Laterality Date . APPENDECTOMY  1978 .  CATARACT EXTRACTION W/ INTRAOCULAR LENS IMPLANT Right 2014 approx. . CYSTOSCOPY W/ URETERAL STENT PLACEMENT Bilateral 05/07/2017  Procedure: CYSTOSCOPY WITH RETROGRADE  PYELOGRAM/URETERAL;  Surgeon: Cleon Gustin, MD;  Location[facility];  Service: Urology;  Laterality: Bilateral; . EP IMPLANTABLE DEVICE N/A 08/18/2016  Procedure: Loop Recorder Insertion;  Surgeon: Thompson Grayer, MD;  Location: [facility];  Service: Cardiovascular;  Laterality: N/A; . REMOVAL NODULES BILATERAL FLANK  2008 approx.  per pt benign fatty tissue . REPAIR OF PERFORATED ULCER  01/1978  bleeding peptic ulcer . TEE WITHOUT CARDIOVERSION N/A 08/18/2016  Procedure: TRANSESOPHAGEAL ECHOCARDIOGRAM (TEE);  Surgeon: Sueanne Margarita, MD;  Location: [facility];  Service: Cardiovascular;  Laterality: N/A; no evidence thrombus;  mobile atrial septum w/ positive bubble study consistent with PFO/ trivial AR, mild TR . TRANSTHORACIC ECHOCARDIOGRAM  04/09/2015  ef 16-10%, grade 1 diastolic dysfunction/  mild AV sclerosis without stenosis/  mild TR,  trivial MR and PR . TRANSURETHRAL RESECTION OF BLADDER TUMOR N/A 05/07/2017  Procedure: TRANSURETHRAL RESECTION OF BLADDER TUMOR (TURBT);  Surgeon: Cleon Gustin, MD;  Location[facility]  Service: Urology;  Laterality: N/A; . TRANSURETHRAL RESECTION OF BLADDER TUMOR N/A 06/18/2017  Procedure: TRANSURETHRAL RESECTION OF BLADDER TUMOR (TURBT);  Surgeon: Cleon Gustin, MD;  Location: [facility];  Service: Urology;  Laterality: N/A; . TRANSURETHRAL RESECTION OF BLADDER TUMOR N/A 09/11/2018  Procedure: TRANSURETHRAL RESECTION OF BLADDER TUMOR (TURBT);  Surgeon: Cleon Gustin, MD;  Location: [facility];  Service: Urology;  Laterality: N/A; . TRANSURETHRAL RESECTION OF PROSTATE  08-20-2013   at [facility] HPI: 78 year old male with a history of prior CVA, Parkinson's disease, hypertension, was brought to the hospital with increasing confusion, headache was noted to be  febrile in the emergency room.  Lumbar puncture performed did not indicate any CNS infection.  Urinalysis indicated possible urinary infection.  CT scan of the abdomen pelvis indicated thickening around bladder versus prostate.  He has been started on broad-spectrum antibiotics.  We will follow-up urine culture. Pt reports difficulty with drinking water and shortness of breath following. BSE requested.  Subjective: "I sometimes cough when I drink water too fast." Assessment / Plan / Recommendation CHL IP CLINICAL IMPRESSIONS 01/15/2019 Clinical Impression Pt presents with min oropharyngeal phase dysphagia characterized by reduced oral bolus cohesiveness, piecemeal deglutition, mild premature spillage with cup and straw sips of thin liquids and swallow trigger at the level of the valleculae across textures and consistencies; Pt with a single episode of flash penetration during the swallow with sequential straw sips of thin liquids and no additional penetration or aspiration observed. Pt with occasional mild/trace BOT and pyriform residuals which cleared with secondary/repeat swallow. A small collection of barium filled near UES and cleared, bringing the possibility of a small Zenker's diverticulum, however no radiologist present to confirm and did not appear clinically significant (just something to note for future studies). The barium tablet was transiently delayed in the distal esophagus, however cleared with a bite of puree. Esophageal sweep was otherwise unremarkable. Recommend regular textures and thin liquids with standard aspiration and reflux precautions, also encourage Pt to take small sips of liquid given his subjective c/o occasional coughing while drinking water and shortness of breath. No further SLP services indicated at this time. Results and recommendations reviewed with the Pt and his wife in room following the MBSS.  SLP Visit Diagnosis Dysphagia, oropharyngeal phase (R13.12) Attention and  concentration deficit following -- Frontal lobe and executive function deficit following -- Impact on safety and function Mild aspiration risk   CHL IP TREATMENT RECOMMENDATION 01/15/2019 Treatment Recommendations No treatment recommended at this  time   Prognosis 01/15/2019 Prognosis for Safe Diet Advancement Good Barriers to Reach Goals -- Barriers/Prognosis Comment -- CHL IP DIET RECOMMENDATION 01/15/2019 SLP Diet Recommendations Regular solids;Thin liquid Liquid Administration via Cup;Straw Medication Administration Whole meds with liquid Compensations Small sips/bites Postural Changes Remain semi-upright after after feeds/meals (Comment);Seated upright at 90 degrees   CHL IP OTHER RECOMMENDATIONS 01/15/2019 Recommended Consults -- Oral Care Recommendations Oral care BID;Staff/trained caregiver to provide oral care Other Recommendations Clarify dietary restrictions   CHL IP FOLLOW UP RECOMMENDATIONS 01/15/2019 Follow up Recommendations None   CHL IP FREQUENCY AND DURATION 01/15/2019 Speech Therapy Frequency (ACUTE ONLY) min 2x/week Treatment Duration 1 week      CHL IP ORAL PHASE 01/15/2019 Oral Phase Impaired Oral - Pudding Teaspoon -- Oral - Pudding Cup -- Oral - Honey Teaspoon -- Oral - Honey Cup -- Oral - Nectar Teaspoon -- Oral - Nectar Cup -- Oral - Nectar Straw -- Oral - Thin Teaspoon -- Oral - Thin Cup Premature spillage;Piecemeal swallowing;Decreased bolus cohesion Oral - Thin Straw Premature spillage Oral - Puree -- Oral - Mech Soft -- Oral - Regular Delayed oral transit;Lingual/palatal residue Oral - Multi-Consistency -- Oral - Pill -- Oral Phase - Comment --  CHL IP PHARYNGEAL PHASE 01/15/2019 Pharyngeal Phase Impaired Pharyngeal- Pudding Teaspoon -- Pharyngeal -- Pharyngeal- Pudding Cup -- Pharyngeal -- Pharyngeal- Honey Teaspoon -- Pharyngeal -- Pharyngeal- Honey Cup -- Pharyngeal -- Pharyngeal- Nectar Teaspoon -- Pharyngeal -- Pharyngeal- Nectar Cup -- Pharyngeal -- Pharyngeal- Nectar Straw -- Pharyngeal  -- Pharyngeal- Thin Teaspoon Delayed swallow initiation-vallecula;WFL Pharyngeal -- Pharyngeal- Thin Cup Delayed swallow initiation-vallecula Pharyngeal -- Pharyngeal- Thin Straw Delayed swallow initiation-vallecula;Penetration/Aspiration during swallow;Pharyngeal residue - pyriform Pharyngeal Material does not enter airway;Material enters airway, remains ABOVE vocal cords then ejected out Pharyngeal- Puree Delayed swallow initiation-vallecula;WFL;Pharyngeal residue - valleculae;Pharyngeal residue - cp segment Pharyngeal -- Pharyngeal- Mechanical Soft -- Pharyngeal -- Pharyngeal- Regular Delayed swallow initiation-vallecula Pharyngeal -- Pharyngeal- Multi-consistency -- Pharyngeal -- Pharyngeal- Pill WFL Pharyngeal -- Pharyngeal Comment --  CHL IP CERVICAL ESOPHAGEAL PHASE 01/15/2019 Cervical Esophageal Phase WFL Pudding Teaspoon -- Pudding Cup -- Honey Teaspoon -- Honey Cup -- Nectar Teaspoon -- Nectar Cup -- Nectar Straw -- Thin Teaspoon -- Thin Cup -- Thin Straw -- Puree -- Mechanical Soft -- Regular -- Multi-consistency -- Pill -- Cervical Esophageal Comment -- Thank you, Genene Churn, [place] PORTER,DABNEY 01/15/2019, 1:02 PM                 Subjective: Pt much more alert and back to his baseline, he has no complaints, he is eating and drinking well.    Discharge Exam: Vitals:   01/16/19 0502 01/16/19 1329  BP: (!) 160/86 (!) 145/81  Pulse: 65 60  Resp: 18 18  Temp: 98.3 F (36.8 C) 98.9 F (37.2 C)  SpO2: 97% 98%   Vitals:   01/15/19 2129 01/15/19 2148 01/16/19 0502 01/16/19 1329  BP:  132/73 (!) 160/86 (!) 145/81  Pulse:  61 65 60  Resp:  (!) 24 18 18   Temp:  98.9 F (37.2 C) 98.3 F (36.8 C) 98.9 F (37.2 C)  TempSrc:  Oral Oral Oral  SpO2: 97% 98% 97% 98%  Weight:      Height:        General exam: Alert, awake, oriented x 3.  NAD.  Respiratory system: Clear to auscultation. Respiratory effort normal. Cardiovascular system: normal s1,s2 sounds. No murmurs,  rubs, gallops. Gastrointestinal system: Abdomen is nondistended, soft and nontender. No organomegaly or  masses felt. Normal bowel sounds heard. Central nervous system: Alert and oriented. No focal neurological deficits. Extremities: No C/C/E, +pedal pulses Skin: No rashes, lesions or ulcers Psychiatry: Judgement and insight appear normal. Mood & affect appropriate.    The results of significant diagnostics from this hospitalization (including imaging, microbiology, ancillary and laboratory) are listed below for reference.     Microbiology: Recent Results (from the past 240 hour(s))  Urine culture     Status: Abnormal   Collection Time: 01/12/19 12:35 PM  Result Value Ref Range Status   Specimen Description   Final    URINE, CLEAN CATCH Performed at [facility], [address], [place], [place] [address]    Special Requests   Final    NONE Performed at [facility], [address], [place], [place] [address]    Culture (A)  Final    50,000 COLONIES/mL ENTEROBACTER CLOACAE 20,000 COLONIES/mL PSEUDOMONAS AERUGINOSA    Report Status 01/15/2019 FINAL  Final   Organism ID, Bacteria ENTEROBACTER CLOACAE (A)  Final   Organism ID, Bacteria PSEUDOMONAS AERUGINOSA (A)  Final      Susceptibility   Enterobacter cloacae - MIC*    CEFAZOLIN >=64 RESISTANT Resistant     CEFTRIAXONE 2 SENSITIVE Sensitive     CIPROFLOXACIN <=0.25 SENSITIVE Sensitive     GENTAMICIN <=1 SENSITIVE Sensitive     IMIPENEM <=0.25 SENSITIVE Sensitive     NITROFURANTOIN 64 INTERMEDIATE Intermediate     TRIMETH/SULFA <=20 SENSITIVE Sensitive     PIP/TAZO 8 SENSITIVE Sensitive     * 50,000 COLONIES/mL ENTEROBACTER CLOACAE   Pseudomonas aeruginosa - MIC*    CEFTAZIDIME 2 SENSITIVE Sensitive     CIPROFLOXACIN <=0.25 SENSITIVE Sensitive     GENTAMICIN <=1 SENSITIVE Sensitive     IMIPENEM 2 SENSITIVE Sensitive     PIP/TAZO 8 SENSITIVE Sensitive     CEFEPIME <=1 SENSITIVE Sensitive     * 20,000 COLONIES/mL PSEUDOMONAS  AERUGINOSA  Blood Culture (routine x 2)     Status: None (Preliminary result)   Collection Time: 01/12/19 12:42 PM  Result Value Ref Range Status   Specimen Description   Final    RIGHT ANTECUBITAL BOTTLES DRAWN AEROBIC AND ANAEROBIC   Special Requests Blood Culture adequate volume  Final   Culture   Final    NO GROWTH 4 DAYS Performed at [facility], [address] [place], [place] [address]    Report Status PENDING  Incomplete  Blood Culture (routine x 2)     Status: None (Preliminary result)   Collection Time: 01/12/19 12:42 PM  Result Value Ref Range Status   Specimen Description   Final    BLOOD LEFT HAND BOTTLES DRAWN AEROBIC AND ANAEROBIC   Special Requests   Final    Blood Culture results may not be optimal due to an inadequate volume of blood received in culture bottles   Culture   Final    NO GROWTH 4 DAYS Performed at [facility], [address] [place], [place] [address]    Report Status PENDING  Incomplete  CSF culture     Status: None   Collection Time: 01/12/19  2:40 PM  Result Value Ref Range Status   Specimen Description   Final    CSF Performed at [facility], [address] [place], [place] [address]    Special Requests   Final    Normal Performed at [facility], [address], [place], [place] [address]    Gram Stain  Final    CYTOSPIN SMEAR NO CELLS OR ORGANISMS SEEN Gram Stain Report Called to,Read Back By and Verified With: WHITE,M. AT 9833 ON 01/12/2019 BY EVA Performed at [facility], [address], [place], [place] [address]    Culture   Final    NO GROWTH 3 DAYS Performed at [facility], [place] [address], [place], [place] [address]    Report Status 01/16/2019 FINAL  Final  Hsv Culture And Typing     Status: None   Collection Time: 01/12/19  2:40 PM  Result Value Ref Range Status   HSV Culture/Type Comment  Final    Comment: (NOTE) Negative No Herpes simplex virus isolated. Performed At: [facility], [place] [id number] Rush Farmer MD IO:[id number]    Source of Sample CSF  Final    Comment: Performed at [facility], [address], [place], [place] [address]     Labs: BNP (last 3 results) Recent Labs    07/15/18 1053  BNP 19.6   Basic Metabolic Panel: Recent Labs  Lab 01/12/19 1242 01/13/19 0744 01/14/19 0417 01/15/19 0410  NA 138 135 136 136  K 3.5 3.4* 3.3* 3.8  CL 103 103 104 103  CO2 25 23 24 25   GLUCOSE 147* 137* 133* 115*  BUN 15 14 9 8   CREATININE 1.31* 1.27* 1.09 1.02  CALCIUM 8.8* 8.2* 8.2* 8.9   Liver Function Tests: Recent Labs  Lab 01/12/19 1242 01/13/19 0744  AST 18 13*  ALT 22 16  ALKPHOS 72 55  BILITOT 3.6* 2.3*  PROT 7.2 5.7*  ALBUMIN 3.8 2.8*   No results for input(s): LIPASE, AMYLASE in the last 168 hours. No results for input(s): AMMONIA in the last 168 hours. CBC: Recent Labs  Lab 01/12/19 1242 01/13/19 0744 01/14/19 0417 01/15/19 0410  WBC 14.4* 11.1* 8.3 6.9  NEUTROABS 11.8*  --   --   --   HGB 13.7 11.9* 11.3* 12.0*  HCT 46.4 38.6* 37.3* 40.2  MCV 83.2 82.8 82.9 82.2  PLT 186 158 183 208   Cardiac Enzymes: No results for input(s): CKTOTAL, CKMB, CKMBINDEX, TROPONINI in the last 168 hours. BNP: Invalid input(s): POCBNP CBG: Recent Labs  Lab 01/12/19 1218  GLUCAP 140*   D-Dimer No results for input(s): DDIMER in the last 72 hours. Hgb A1c No results for input(s): HGBA1C in the last 72 hours. Lipid Profile No results for input(s): CHOL, HDL, LDLCALC, TRIG, CHOLHDL, LDLDIRECT in the last 72 hours. Thyroid function studies No results for input(s): TSH, T4TOTAL, T3FREE, THYROIDAB in the last 72 hours.  Invalid input(s): FREET3 Anemia work up No results for input(s): VITAMINB12, FOLATE, FERRITIN, TIBC, IRON, RETICCTPCT in the last 72 hours. Urinalysis    Component Value Date/Time   COLORURINE AMBER (A) 01/12/2019 1235   APPEARANCEUR HAZY (A) 01/12/2019 1235   LABSPEC 1.026 01/12/2019 1235   PHURINE 5.0  01/12/2019 1235   GLUCOSEU NEGATIVE 01/12/2019 1235   HGBUR SMALL (A) 01/12/2019 1235   BILIRUBINUR NEGATIVE 01/12/2019 1235   KETONESUR 5 (A) 01/12/2019 1235   PROTEINUR 30 (A) 01/12/2019 1235   NITRITE NEGATIVE 01/12/2019 1235   LEUKOCYTESUR SMALL (A) 01/12/2019 1235   Sepsis Labs Invalid input(s): PROCALCITONIN,  WBC,  LACTICIDVEN Microbiology Recent Results (from the past 240 hour(s))  Urine culture     Status: Abnormal   Collection Time: 01/12/19 12:35 PM  Result Value Ref Range Status   Specimen Description   Final    URINE,  CLEAN CATCH Performed at [facility], [address] [place], [place] [address]    Special Requests   Final    NONE Performed at [facility], [address] [place], [place] [address]    Culture (A)  Final    50,000 COLONIES/mL ENTEROBACTER CLOACAE 20,000 COLONIES/mL PSEUDOMONAS AERUGINOSA    Report Status 01/15/2019 FINAL  Final   Organism ID, Bacteria ENTEROBACTER CLOACAE (A)  Final   Organism ID, Bacteria PSEUDOMONAS AERUGINOSA (A)  Final      Susceptibility   Enterobacter cloacae - MIC*    CEFAZOLIN >=64 RESISTANT Resistant     CEFTRIAXONE 2 SENSITIVE Sensitive     CIPROFLOXACIN <=0.25 SENSITIVE Sensitive     GENTAMICIN <=1 SENSITIVE Sensitive     IMIPENEM <=0.25 SENSITIVE Sensitive     NITROFURANTOIN 64 INTERMEDIATE Intermediate     TRIMETH/SULFA <=20 SENSITIVE Sensitive     PIP/TAZO 8 SENSITIVE Sensitive     * 50,000 COLONIES/mL ENTEROBACTER CLOACAE   Pseudomonas aeruginosa - MIC*    CEFTAZIDIME 2 SENSITIVE Sensitive     CIPROFLOXACIN <=0.25 SENSITIVE Sensitive     GENTAMICIN <=1 SENSITIVE Sensitive     IMIPENEM 2 SENSITIVE Sensitive     PIP/TAZO 8 SENSITIVE Sensitive     CEFEPIME <=1 SENSITIVE Sensitive     * 20,000 COLONIES/mL PSEUDOMONAS AERUGINOSA  Blood Culture (routine x 2)     Status: None (Preliminary result)   Collection Time: 01/12/19 12:42 PM  Result Value Ref Range Status   Specimen Description   Final    RIGHT  ANTECUBITAL BOTTLES DRAWN AEROBIC AND ANAEROBIC   Special Requests Blood Culture adequate volume  Final   Culture   Final    NO GROWTH 4 DAYS Performed at [facility], [address], [place], [place] [address]    Report Status PENDING  Incomplete  Blood Culture (routine x 2)     Status: None (Preliminary result)   Collection Time: 01/12/19 12:42 PM  Result Value Ref Range Status   Specimen Description   Final    BLOOD LEFT HAND BOTTLES DRAWN AEROBIC AND ANAEROBIC   Special Requests   Final    Blood Culture results may not be optimal due to an inadequate volume of blood received in culture bottles   Culture   Final    NO GROWTH 4 DAYS Performed at [facility], [address] [place], [place] [address]    Report Status PENDING  Incomplete  CSF culture     Status: None   Collection Time: 01/12/19  2:40 PM  Result Value Ref Range Status   Specimen Description   Final    CSF Performed at [facility], [address], [place], [place] [address]    Special Requests   Final    Normal Performed at [facility], [address] [place],  [address]    Gram Stain   Final    CYTOSPIN SMEAR NO CELLS OR ORGANISMS SEEN Gram Stain Report Called to,Read Back By and Verified With: WHITE,M. AT 1610 ON 01/12/2019 BY EVA Performed at [facility], [address], [place],  [address]    Culture   Final    NO GROWTH 3 DAYS Performed at [facility], [address], [place],  [address]    Report Status 01/16/2019 FINAL  Final  Hsv Culture And Typing     Status: None   Collection Time: 01/12/19  2:40 PM  Result Value Ref Range Status   HSV Culture/Type  Comment  Final    Comment: (NOTE) Negative No Herpes simplex virus isolated. Performed At: [facility] [place], [place] [id number] Rush Farmer MD AC:[id number]    Source of Sample CSF  Final    Comment: Performed at [facility], [address], [place], [place] [address]   Time  coordinating discharge: 35 minutes   SIGNED:  Irwin Brakeman, MD  [facility] 01/16/2019, 3:31 PM How to contact the [facility] Attending or Consulting provider [place] or covering provider during after hours [place], for this patient?  1. Check the care team in [facility] and look for a) attending/consulting [facility] provider listed and b) the [facility] team listed 2. Log into [email or web address] and use [place]'s universal password to access. If you do not have the password, please contact the hospital operator. 3. Locate the [facility] provider you are looking for under [facility] and page to a number that you can be directly reached. 4. If you still have difficulty reaching the provider, please page the [facility] (Director on Call) for the Hospitalists listed on amion for assistance.

## 2019-01-16 NOTE — Progress Notes (Signed)
Physical Therapy Treatment Patient Details Name: Mike Nixon MRN: [id number] DOB: 08/23/1941 Today's Date: 01/16/2019    History of Present Illness Mike Nixon is a 78 y.o. 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  Patient has also been complaining of a headache for the last day or 2.  He also tells me that he has been having generalized abdominal pain since Friday.  He denies any nausea or vomiting, and family states that he has been eating without difficulties, but did have a poor appetite over the last couple of days.  He denies any diarrhea.  He denies any neck pain but does complain of headache.  Denies any photophobia.  He denies any chest pain or shortness of breath.  Wife also tells me that he has been complaining of difficulties urinating since last night, feels the urge to go but cannot.  Family also reports that he has been diagnosed and treated for pneumonia about 6 weeks ago.  He denies any chest congestion or cough.  He denies any flulike illness.    PT Comments    Patient demonstrates increased endurance/distance for gait training with fair/good return for bilateral heel to toe stepping, more stead with less ataxic like movement of legs and tolerated sitting up in chair after therapy.  Patient will benefit from continued physical therapy in hospital and recommended venue below to increase strength, balance, endurance for safe ADLs and gait.    Follow Up Recommendations  SNF;Supervision/Assistance - 24 hour;Supervision for mobility/OOB     Equipment Recommendations  None recommended by PT    Recommendations for Other Services       Precautions / Restrictions Precautions Precautions: Fall Restrictions Weight Bearing Restrictions: No     Mobility  Bed Mobility Overal bed mobility: Needs Assistance Bed Mobility: Supine to Sit     Supine to sit: Min guard     General bed mobility comments: increased time, c/o right knee pain  Transfers Overall transfer level: Needs assistance Equipment used: Rolling walker (2 wheeled) Transfers: Sit to/from [facility] Sit to Stand: Min guard Stand pivot transfers: Min guard       General transfer comment: slow labored movement  Ambulation/Gait Ambulation/Gait assistance: Min guard;Min assist Gait Distance (Feet): 80 Feet Assistive device: Rolling walker (2 wheeled) Gait Pattern/deviations: Decreased step length - right;Decreased step length - left;Decreased stride length Gait velocity: decreased   General Gait Details: increased endurance/distance for ambulation with slow labored cadence, improvement for bilateral heel to toe stepping with less ataxic like movement   Stairs             Wheelchair Mobility    Modified Rankin (Stroke Patients Only)       Balance Overall balance assessment: Needs assistance Sitting-balance support: Feet supported;No upper extremity supported Sitting balance-Leahy Scale: Good     Standing balance support: Bilateral upper extremity supported;During functional activity Standing balance-Leahy Scale: Fair Standing balance comment: using RW                            Cognition Arousal/Alertness: Awake/alert Behavior During Therapy: WFL for tasks assessed/performed Overall Cognitive Status: Within Functional Limits for tasks assessed  Exercises General Exercises - Lower Extremity Long Arc Quad: Seated;AROM;Strengthening;Both;10 reps Hip Flexion/Marching: Seated;Strengthening;AROM;10 reps;Both Toe Raises: Seated;Strengthening;AROM;Both;10 reps Heel Raises: Seated;AROM;Strengthening;Both;10 reps    General Comments        Pertinent  Vitals/Pain Pain Assessment: Faces Faces Pain Scale: Hurts little more Pain Location: right knee distal to patella Pain Descriptors / Indicators: Sore;Discomfort Pain Intervention(s): Limited activity within patient's tolerance;Monitored during session    Home Living                      Prior Function            PT Goals (current goals can now be found in the care plan section) Acute Rehab PT Goals Patient Stated Goal: return home PT Goal Formulation: With patient/family Time For Goal Achievement: 01/27/19 Progress towards PT goals: Progressing toward goals    Frequency    Min 3X/week      PT Plan Current plan remains appropriate    Co-evaluation              AM-PAC PT "6 Clicks" Mobility   Outcome Measure  Help needed turning from your back to your side while in a flat bed without using bedrails?: None Help needed moving from lying on your back to sitting on the side of a flat bed without using bedrails?: A Little Help needed moving to and from a bed to a chair (including a wheelchair)?: A Little Help needed standing up from a chair using your arms (e.g., wheelchair or bedside chair)?: A Little Help needed to walk in hospital room?: A Little Help needed climbing 3-5 steps with a railing? : A Lot 6 Click Score: 18    End of Session   Activity Tolerance: Patient tolerated treatment well Patient left: in chair;with call bell/phone within reach;with family/visitor present Nurse Communication: Mobility status PT Visit Diagnosis: Unsteadiness on feet (R26.81);Other abnormalities of gait and mobility (R26.89);History of falling (Z91.81)     Time: 4967-5916 PT Time Calculation (min) (ACUTE ONLY): 29 min  Charges:  $Gait Training: 8-22 mins $Therapeutic Exercise: 8-22 mins                     4:03 PM, 01/16/19 Lonell Grandchild, MPT Physical Therapist with [facility] [phone] office [phone] mobile phone

## 2019-01-16 NOTE — Progress Notes (Signed)
PROGRESS NOTE    Mike Nixon  NAT:[id number] DOB: [birth year] DOA: 01/12/2019 PCP: [name], MD    Brief Narrative:  78 year old male with a history of prior CVA, Parkinson's disease, hypertension, was brought to the hospital with increasing confusion, headache was noted to be febrile in the emergency room.  Lumbar puncture performed did not indicate any CNS infection.  Urinalysis indicated possible urinary infection.  CT scan of the abdomen pelvis indicated thickening around bladder versus prostate.  He has been started on broad-spectrum antibiotics.  We will follow-up urine culture.   Assessment & Plan:   Active Problems:   Parkinson's disease ([place])   Benign essential HTN   HLD (hyperlipidemia)   Sepsis (HCC)   BPH (benign prostatic hyperplasia)   Acute lower UTI   1. Sepsis, related to urinary tract infection.  Urine culture positive for Pseudomonas.  He was febrile on admission, but with antibiotic therapy this appears to have improved.  Currently on IV Zosyn.  CT of the abdomen pelvis also indicated possible urinary source.  Postvoid residual bladder scan checked and did not show any urinary retention.  Case discussed with Dr. Alyson Ingles who is patient's primary urologist with no further recommendations.  He will need to follow-up with urology as an outpatient to be considered for prostatectomy. 2. Headache.  Resolved now.  Unclear etiology.  CT scan of the head is unremarkable. Patient also had lumbar puncture done which did not show any signs of infection.  Of note, he had headache prior to lumbar puncture.  Does not have any photophobia or changes in vision.  Does not have any tenderness with palpation over his temples.  This is resolved with Toradol. 3. Dysphagia. MBS with mild aspiration risk, continue regular, thin per SLP team.  4. Prior CVA.  He has residual right-sided weakness.  Continue on Plavix for secondary prevention. 5. Hypertension.  Blood pressures currently  stable. 6. Parkinson's disease.  Continue home medications. 7. Hyperlipidemia.  Continue statin. 8. Recurrent falls at home.  Seen by physical therapy with recommendations for skilled nursing facility placement.  Currently awaiting insurance auth so that he can go to his SNF.   DVT prophylaxis: SCDs Code Status: Full code Family Communication: Discussed with wife at the bedside Disposition Plan: Skilled nursing facility when bed available.  Awaiting for insurance authorization  Consultants:     Procedures:     Antimicrobials:   Vancomycin 2/16 >2/18  Zosyn 2/16 >  Subjective: No dysuria today.    Objective: Vitals:   01/15/19 1322 01/15/19 2129 01/15/19 2148 01/16/19 0502  BP: (!) 142/77  132/73 (!) 160/86  Pulse: (!) 58  61 65  Resp: 18  (!) 24 18  Temp: 98.4 F (36.9 C)  98.9 F (37.2 C) 98.3 F (36.8 C)  TempSrc: Oral  Oral Oral  SpO2: 98% 97% 98% 97%  Weight:      Height:        Intake/Output Summary (Last 24 hours) at 01/16/2019 1259 Last data filed at 01/16/2019 1100 Gross per 24 hour  Intake 2271.3 ml  Output 1100 ml  Net 1171.3 ml   Filed Weights   01/12/19 1221 01/13/19 1435  Weight: 81.6 kg 80.2 kg    Examination:  General exam: Alert, awake, oriented x 3.  NAD.  Respiratory system: Clear to auscultation. Respiratory effort normal. Cardiovascular system: normal s1,s2 sounds. No murmurs, rubs, gallops. Gastrointestinal system: Abdomen is nondistended, soft and nontender. No organomegaly or masses felt. Normal bowel sounds  heard. Central nervous system: Alert and oriented. No focal neurological deficits. Extremities: No C/C/E, +pedal pulses Skin: No rashes, lesions or ulcers Psychiatry: Judgement and insight appear normal. Mood & affect appropriate.   Data Reviewed: I have personally reviewed following labs and imaging studies  CBC: Recent Labs  Lab 01/12/19 1242 01/13/19 0744 01/14/19 0417 01/15/19 0410  WBC 14.4* 11.1* 8.3 6.9    NEUTROABS 11.8*  --   --   --   HGB 13.7 11.9* 11.3* 12.0*  HCT 46.4 38.6* 37.3* 40.2  MCV 83.2 82.8 82.9 82.2  PLT 186 158 183 741   Basic Metabolic Panel: Recent Labs  Lab 01/12/19 1242 01/13/19 0744 01/14/19 0417 01/15/19 0410  NA 138 135 136 136  K 3.5 3.4* 3.3* 3.8  CL 103 103 104 103  CO2 25 23 24 25   GLUCOSE 147* 137* 133* 115*  BUN 15 14 9 8   CREATININE 1.31* 1.27* 1.09 1.02  CALCIUM 8.8* 8.2* 8.2* 8.9   GFR: Estimated Creatinine Clearance: 62.5 mL/min (by C-G formula based on SCr of 1.02 mg/dL). Liver Function Tests: Recent Labs  Lab 01/12/19 1242 01/13/19 0744  AST 18 13*  ALT 22 16  ALKPHOS 72 55  BILITOT 3.6* 2.3*  PROT 7.2 5.7*  ALBUMIN 3.8 2.8*   No results for input(s): LIPASE, AMYLASE in the last 168 hours. No results for input(s): AMMONIA in the last 168 hours. Coagulation Profile: No results for input(s): INR, PROTIME in the last 168 hours. Cardiac Enzymes: No results for input(s): CKTOTAL, CKMB, CKMBINDEX, TROPONINI in the last 168 hours. BNP (last 3 results) No results for input(s): PROBNP in the last 8760 hours. HbA1C: No results for input(s): HGBA1C in the last 72 hours. CBG: Recent Labs  Lab 01/12/19 1218  GLUCAP 140*   Lipid Profile: No results for input(s): CHOL, HDL, LDLCALC, TRIG, CHOLHDL, LDLDIRECT in the last 72 hours. Thyroid Function Tests: No results for input(s): TSH, T4TOTAL, FREET4, T3FREE, THYROIDAB in the last 72 hours. Anemia Panel: No results for input(s): VITAMINB12, FOLATE, FERRITIN, TIBC, IRON, RETICCTPCT in the last 72 hours. Sepsis Labs: Recent Labs  Lab 01/12/19 1258 01/12/19 1441  LATICACIDVEN 2.3* 1.7    Recent Results (from the past 240 hour(s))  Urine culture     Status: Abnormal   Collection Time: 01/12/19 12:35 PM  Result Value Ref Range Status   Specimen Description   Final    URINE, CLEAN CATCH Performed at [facility], [address] [place], [place] [address]    Special Requests   Final     NONE Performed at [facility], [address], [place], [place] [address]    Culture (A)  Final    50,000 COLONIES/mL ENTEROBACTER CLOACAE 20,000 COLONIES/mL PSEUDOMONAS AERUGINOSA    Report Status 01/15/2019 FINAL  Final   Organism ID, Bacteria ENTEROBACTER CLOACAE (A)  Final   Organism ID, Bacteria PSEUDOMONAS AERUGINOSA (A)  Final      Susceptibility   Enterobacter cloacae - MIC*    CEFAZOLIN >=64 RESISTANT Resistant     CEFTRIAXONE 2 SENSITIVE Sensitive     CIPROFLOXACIN <=0.25 SENSITIVE Sensitive     GENTAMICIN <=1 SENSITIVE Sensitive     IMIPENEM <=0.25 SENSITIVE Sensitive     NITROFURANTOIN 64 INTERMEDIATE Intermediate     TRIMETH/SULFA <=20 SENSITIVE Sensitive     PIP/TAZO 8 SENSITIVE Sensitive     * 50,000 COLONIES/mL ENTEROBACTER CLOACAE   Pseudomonas aeruginosa - MIC*    CEFTAZIDIME 2 SENSITIVE Sensitive  CIPROFLOXACIN <=0.25 SENSITIVE Sensitive     GENTAMICIN <=1 SENSITIVE Sensitive     IMIPENEM 2 SENSITIVE Sensitive     PIP/TAZO 8 SENSITIVE Sensitive     CEFEPIME <=1 SENSITIVE Sensitive     * 20,000 COLONIES/mL PSEUDOMONAS AERUGINOSA  Blood Culture (routine x 2)     Status: None (Preliminary result)   Collection Time: 01/12/19 12:42 PM  Result Value Ref Range Status   Specimen Description   Final    RIGHT ANTECUBITAL BOTTLES DRAWN AEROBIC AND ANAEROBIC   Special Requests Blood Culture adequate volume  Final   Culture   Final    NO GROWTH 4 DAYS Performed at [facility], [address] [place], [place] [address]    Report Status PENDING  Incomplete  Blood Culture (routine x 2)     Status: None (Preliminary result)   Collection Time: 01/12/19 12:42 PM  Result Value Ref Range Status   Specimen Description   Final    BLOOD LEFT HAND BOTTLES DRAWN AEROBIC AND ANAEROBIC   Special Requests   Final    Blood Culture results may not be optimal due to an inadequate volume of blood received in culture bottles   Culture   Final    NO GROWTH 4 DAYS Performed at  [facility], [address] [place], [place] [address]    Report Status PENDING  Incomplete  CSF culture     Status: None   Collection Time: 01/12/19  2:40 PM  Result Value Ref Range Status   Specimen Description   Final    CSF Performed at [facility], [address], [place], [place] [address]    Special Requests   Final    Normal Performed at [facility], [address], [place], [place] [address]    Gram Stain   Final    CYTOSPIN SMEAR NO CELLS OR ORGANISMS SEEN Gram Stain Report Called to,Read Back By and Verified With: WHITE,M. AT 2536 ON 01/12/2019 BY EVA Performed at [facility], [address] [place], [place] [address]    Culture   Final    NO GROWTH 3 DAYS Performed at [facility], [place] [address], [place], [place] [address]    Report Status 01/16/2019 FINAL  Final  Hsv Culture And Typing     Status: None   Collection Time: 01/12/19  2:40 PM  Result Value Ref Range Status   HSV Culture/Type Comment  Final    Comment: (NOTE) Negative No Herpes simplex virus isolated. Performed At: [facility] [place], [place] [id number] Rush Farmer MD PP:[id number]    Source of Sample CSF  Final    Comment: Performed at [facility], [address], [place], [place] [address]    Radiology Studies: Dg Swallowing Func-speech Pathology  Result Date: 01/15/2019 Objective Swallowing Evaluation: Type of Study: MBS-Modified Barium Swallow Study  Patient Details Name: Colum Colt MRN: [id number] Date of Birth: 01-18-1941 Today's Date: 01/15/2019 Time: SLP Start Time (ACUTE ONLY): 1110 -SLP Stop Time (ACUTE ONLY): 1139 SLP Time Calculation (min) (ACUTE ONLY): 29 min Past Medical History: Past Medical History: Diagnosis Date . Anticoagulant long-term use   PLAVIX . Benign localized prostatic hyperplasia with lower urinary tract symptoms (LUTS)  . Bladder tumor  . Borderline diabetes   watches diet . ED (erectile dysfunction) of organic origin  . Gait  disturbance, post-stroke   uses wheelchair long distance . Glaucoma, right eye  . History of bleeding peptic ulcer   03/ 1979  s/p  surgery . History of concussion   1987 no loc-- no residual . History of CVA with residual deficit   05/ 2016 CYPTOGENIC CVA --- RESIDUAL GAIT DISTURBANCE AND SIALORRHEA ACCESS (DROOLING) . Hyperlipidemia  . Hypertension  . Mild atherosclerosis of both carotid arteries   per duplex from outside facitlity 04-08-2015 less then 50% stenosis bilateral ICA . Parkinson's disease [facility]) neurologist -- dr r. Mitzi Hansen falconer ([facility] [place] [facility])  dx 2015 . 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 PFO (patent foramen ovale)   per TEE 08-18-2016 . Speech impairment   post-stroke . Status post placement of implantable loop recorder 08/18/2016  placed by dr allred . Wears glasses  Past Surgical History: Past Surgical History: Procedure Laterality Date . APPENDECTOMY  1978 . CATARACT EXTRACTION W/ INTRAOCULAR LENS IMPLANT Right 2014 approx. . CYSTOSCOPY W/ URETERAL STENT PLACEMENT Bilateral 05/07/2017  Procedure: CYSTOSCOPY WITH RETROGRADE PYELOGRAM/URETERAL;  Surgeon: Cleon Gustin, MD;  Location: [facility];  Service: Urology;  Laterality: Bilateral; . EP IMPLANTABLE DEVICE N/A 08/18/2016  Procedure: Loop Recorder Insertion;  Surgeon: Thompson Grayer, MD;  Location: [place] [facility];  Service: Cardiovascular;  Laterality: N/A; . REMOVAL NODULES BILATERAL FLANK  2008 approx.  per pt benign fatty tissue . REPAIR OF PERFORATED ULCER  01/1978  bleeding peptic ulcer . TEE WITHOUT CARDIOVERSION N/A 08/18/2016  Procedure: TRANSESOPHAGEAL ECHOCARDIOGRAM (TEE);  Surgeon: Sueanne Margarita, MD;  Location: [facility];  Service: Cardiovascular;  Laterality: N/A; no evidence thrombus;  mobile atrial septum w/ positive bubble study consistent with PFO/ trivial AR, mild TR . TRANSTHORACIC ECHOCARDIOGRAM  04/09/2015  ef 41-93%, grade 1 diastolic  dysfunction/  mild AV sclerosis without stenosis/  mild TR,  trivial MR and PR . TRANSURETHRAL RESECTION OF BLADDER TUMOR N/A 05/07/2017  Procedure: TRANSURETHRAL RESECTION OF BLADDER TUMOR (TURBT);  Surgeon: Cleon Gustin, MD;  Location: [facility];  Service: Urology;  Laterality: N/A; . TRANSURETHRAL RESECTION OF BLADDER TUMOR N/A 06/18/2017  Procedure: TRANSURETHRAL RESECTION OF BLADDER TUMOR (TURBT);  Surgeon: Cleon Gustin, MD;  Location: [facility];  Service: Urology;  Laterality: N/A; . TRANSURETHRAL RESECTION OF BLADDER TUMOR N/A 09/11/2018  Procedure: TRANSURETHRAL RESECTION OF BLADDER TUMOR (TURBT);  Surgeon: Cleon Gustin, MD;  Location: [facility];  Service: Urology;  Laterality: N/A; . TRANSURETHRAL RESECTION OF PROSTATE  08-20-2013   at [facility] HPI: 78 year old male with a history of prior CVA, Parkinson's disease, hypertension, was brought to the hospital with increasing confusion, headache was noted to be febrile in the emergency room.  Lumbar puncture performed did not indicate any CNS infection.  Urinalysis indicated possible urinary infection.  CT scan of the abdomen pelvis indicated thickening around bladder versus prostate.  He has been started on broad-spectrum antibiotics.  We will follow-up urine culture. Pt reports difficulty with drinking water and shortness of breath following. BSE requested.  Subjective: "I sometimes cough when I drink water too fast." Assessment / Plan / Recommendation CHL IP CLINICAL IMPRESSIONS 01/15/2019 Clinical Impression Pt presents with min oropharyngeal phase dysphagia characterized by reduced oral bolus cohesiveness, piecemeal deglutition, mild premature spillage with cup and straw sips of thin liquids and swallow trigger at the level of the valleculae across textures and consistencies; Pt with a single episode of flash penetration during the swallow with sequential straw sips of thin liquids and no additional penetration or aspiration  observed. Pt with occasional mild/trace BOT and pyriform residuals which cleared  with secondary/repeat swallow. A small collection of barium filled near UES and cleared, bringing the possibility of a small Zenker's diverticulum, however no radiologist present to confirm and did not appear clinically significant (just something to note for future studies). The barium tablet was transiently delayed in the distal esophagus, however cleared with a bite of puree. Esophageal sweep was otherwise unremarkable. Recommend regular textures and thin liquids with standard aspiration and reflux precautions, also encourage Pt to take small sips of liquid given his subjective c/o occasional coughing while drinking water and shortness of breath. No further SLP services indicated at this time. Results and recommendations reviewed with the Pt and his wife in room following the MBSS.  SLP Visit Diagnosis Dysphagia, oropharyngeal phase (R13.12) Attention and concentration deficit following -- Frontal lobe and executive function deficit following -- Impact on safety and function Mild aspiration risk   CHL IP TREATMENT RECOMMENDATION 01/15/2019 Treatment Recommendations No treatment recommended at this time   Prognosis 01/15/2019 Prognosis for Safe Diet Advancement Good Barriers to Reach Goals -- Barriers/Prognosis Comment -- CHL IP DIET RECOMMENDATION 01/15/2019 SLP Diet Recommendations Regular solids;Thin liquid Liquid Administration via Cup;Straw Medication Administration Whole meds with liquid Compensations Small sips/bites Postural Changes Remain semi-upright after after feeds/meals (Comment);Seated upright at 90 degrees   CHL IP OTHER RECOMMENDATIONS 01/15/2019 Recommended Consults -- Oral Care Recommendations Oral care BID;Staff/trained caregiver to provide oral care Other Recommendations Clarify dietary restrictions   CHL IP FOLLOW UP RECOMMENDATIONS 01/15/2019 Follow up Recommendations None   CHL IP FREQUENCY AND DURATION 01/15/2019  Speech Therapy Frequency (ACUTE ONLY) min 2x/week Treatment Duration 1 week      CHL IP ORAL PHASE 01/15/2019 Oral Phase Impaired Oral - Pudding Teaspoon -- Oral - Pudding Cup -- Oral - Honey Teaspoon -- Oral - Honey Cup -- Oral - Nectar Teaspoon -- Oral - Nectar Cup -- Oral - Nectar Straw -- Oral - Thin Teaspoon -- Oral - Thin Cup Premature spillage;Piecemeal swallowing;Decreased bolus cohesion Oral - Thin Straw Premature spillage Oral - Puree -- Oral - Mech Soft -- Oral - Regular Delayed oral transit;Lingual/palatal residue Oral - Multi-Consistency -- Oral - Pill -- Oral Phase - Comment --  CHL IP PHARYNGEAL PHASE 01/15/2019 Pharyngeal Phase Impaired Pharyngeal- Pudding Teaspoon -- Pharyngeal -- Pharyngeal- Pudding Cup -- Pharyngeal -- Pharyngeal- Honey Teaspoon -- Pharyngeal -- Pharyngeal- Honey Cup -- Pharyngeal -- Pharyngeal- Nectar Teaspoon -- Pharyngeal -- Pharyngeal- Nectar Cup -- Pharyngeal -- Pharyngeal- Nectar Straw -- Pharyngeal -- Pharyngeal- Thin Teaspoon Delayed swallow initiation-vallecula;WFL Pharyngeal -- Pharyngeal- Thin Cup Delayed swallow initiation-vallecula Pharyngeal -- Pharyngeal- Thin Straw Delayed swallow initiation-vallecula;Penetration/Aspiration during swallow;Pharyngeal residue - pyriform Pharyngeal Material does not enter airway;Material enters airway, remains ABOVE vocal cords then ejected out Pharyngeal- Puree Delayed swallow initiation-vallecula;WFL;Pharyngeal residue - valleculae;Pharyngeal residue - cp segment Pharyngeal -- Pharyngeal- Mechanical Soft -- Pharyngeal -- Pharyngeal- Regular Delayed swallow initiation-vallecula Pharyngeal -- Pharyngeal- Multi-consistency -- Pharyngeal -- Pharyngeal- Pill WFL Pharyngeal -- Pharyngeal Comment --  CHL IP CERVICAL ESOPHAGEAL PHASE 01/15/2019 Cervical Esophageal Phase WFL Pudding Teaspoon -- Pudding Cup -- Honey Teaspoon -- Honey Cup -- Nectar Teaspoon -- Nectar Cup -- Nectar Straw -- Thin Teaspoon -- Thin Cup -- Thin Straw -- Puree --  Mechanical Soft -- Regular -- Multi-consistency -- Pill -- Cervical Esophageal Comment -- Thank you, Genene Churn, CCC-SLP [phone] [place] 01/15/2019, 1:02 PM              Scheduled Meds: . atorvastatin  40 mg Oral QPM  . Carbidopa-Levodopa ER  3 capsule Oral QID  . clopidogrel  75 mg Oral Daily  . Dextromethorphan-quiNIDine  1 capsule Oral BID  . finasteride  5 mg Oral Daily  . lidocaine (PF)  5 mL Intradermal Once  . metoprolol succinate  50 mg Oral QPM  . phenazopyridine  100 mg Oral TID WC   Continuous Infusions: . sodium chloride 75 mL/hr at 01/16/19 0600  . piperacillin-tazobactam (ZOSYN)  IV 3.375 g (01/16/19 0924)     LOS: 3 days   Time spent: 30 mins   Wynetta Emery, MD [facility]  If 7PM-7AM, please contact night-coverage [email or web address]  01/16/2019, 12:59 PM

## 2019-01-17 DIAGNOSIS — E782 Mixed hyperlipidemia: Secondary | ICD-10-CM | POA: Diagnosis not present

## 2019-01-17 DIAGNOSIS — R69 Illness, unspecified: Secondary | ICD-10-CM | POA: Diagnosis not present

## 2019-01-17 DIAGNOSIS — G8929 Other chronic pain: Secondary | ICD-10-CM | POA: Diagnosis not present

## 2019-01-17 DIAGNOSIS — M25562 Pain in left knee: Secondary | ICD-10-CM | POA: Diagnosis not present

## 2019-01-17 DIAGNOSIS — N390 Urinary tract infection, site not specified: Secondary | ICD-10-CM | POA: Diagnosis not present

## 2019-01-17 DIAGNOSIS — I69351 Hemiplegia and hemiparesis following cerebral infarction affecting right dominant side: Secondary | ICD-10-CM | POA: Diagnosis not present

## 2019-01-17 DIAGNOSIS — I10 Essential (primary) hypertension: Secondary | ICD-10-CM | POA: Diagnosis not present

## 2019-01-17 DIAGNOSIS — N3281 Overactive bladder: Secondary | ICD-10-CM | POA: Diagnosis not present

## 2019-01-17 DIAGNOSIS — I2510 Atherosclerotic heart disease of native coronary artery without angina pectoris: Secondary | ICD-10-CM | POA: Diagnosis not present

## 2019-01-17 DIAGNOSIS — A419 Sepsis, unspecified organism: Secondary | ICD-10-CM | POA: Diagnosis not present

## 2019-01-17 DIAGNOSIS — M25561 Pain in right knee: Secondary | ICD-10-CM | POA: Diagnosis not present

## 2019-01-17 DIAGNOSIS — M1711 Unilateral primary osteoarthritis, right knee: Secondary | ICD-10-CM | POA: Diagnosis not present

## 2019-01-17 DIAGNOSIS — A498 Other bacterial infections of unspecified site: Secondary | ICD-10-CM | POA: Diagnosis not present

## 2019-01-17 DIAGNOSIS — G20 Parkinson's disease: Secondary | ICD-10-CM | POA: Diagnosis not present

## 2019-01-17 DIAGNOSIS — G4700 Insomnia, unspecified: Secondary | ICD-10-CM | POA: Diagnosis not present

## 2019-01-17 DIAGNOSIS — M6281 Muscle weakness (generalized): Secondary | ICD-10-CM | POA: Diagnosis not present

## 2019-01-17 DIAGNOSIS — I639 Cerebral infarction, unspecified: Secondary | ICD-10-CM | POA: Diagnosis not present

## 2019-01-17 LAB — CULTURE, BLOOD (ROUTINE X 2)
Culture: NO GROWTH
Culture: NO GROWTH
Special Requests: ADEQUATE

## 2019-01-17 NOTE — Care Management (Addendum)
Pt refused SNF at the last minute and [employer] home with order for [facility]. Per wife they Marykay Lex been working on getting [facility]. Per wife she does not know the name of the company but daughter would know. CM has attempted to call daughter and left HIPPA compliant [facility] [phone]).   Update: per Brayton Layman, pt referred to Team Nurse [facility]. Referral faxed to [phone], phone number [phone]. Call placed to verify receipt.

## 2019-01-17 NOTE — Care Management (Signed)
Patient Information   Patient Name Mike Nixon, Mike Nixon ([id number]) Sex Male DOB [birth year]  Room Bed  A303 A303-01  Patient Demographics   Address [place] [address] Phone [phone] (Home) *Preferred*  Patient Ethnicity & Race   Ethnic Group Patient Race  Not Hispanic or Latino Black or African American  Emergency Contact(s)   Name Relation Home Work Mobile  Mike Nixon,Mike Nixon Spouse [phone]  [phone]  Mike Nixon [phone]    Mike Nixon Daughter [phone]  [phone]  Documents on File    Status Date Received Description  Documents for the Patient  [place] Not Received    [place] E-Signature HIPAA Notice of Privacy     Driver's License Received 93/79/02 VADL EXP 3.4.2021  Insurance Card Not Received    Advance Directives/Living Will/HCPOA/POA Not Received    Other Photo ID Not Received    Insurance Card Received 07/03/16 medicare 2017  Insurance Card Received 07/03/16 [place] medicare 2017  [place] E-Signature HIPAA Notice of Privacy Signed 07/03/16 [facility]   Release of Information  07/10/16   Release of Information  07/13/16   AMB Outside Hospital Record  04/14/15 D/S [facility] Record  04/08/15 H&P [place]  AMB Correspondence  07/18/16 7/15-1/17 OFFICE NOTE [facility]  Release of Information   DPR [facility] 08/10/16  AMB Outside Hospital Record  07/10/16 H&P D/S [facility] Record  07/10/16 D/S REPORT [facility]  AMB Provider Completed Forms  10/23/16 CUSTOMER MEDICAL REPORT [place] [facility]  AMB Correspondence  09/26/16 LETTER [facility]  AMB Provider Completed Forms  11/01/16 VISION/MEDICAL REPORT INSTRUCTIONS [facility]  AMB Correspondence  01/18/17 TELEPHONE ADVICE RECORD [facility]  Insurance Card Received 05/07/17 AETNA MCR   HIM ROI Authorization  01/02/18 [facility]  AMBCERTMAIL Received 40/97/35   HIM ROI Authorization (Expired) 06/14/18 Authorization for batch AETNA/EPISource Medicare 2019 Chart Review  fbg  06/14/18  Insurance Card Received 07/15/18 medicare, [place], bcbs, com wealth 2019  Insurance Card Received 08/21/18 new [employer] card  Insurance Card Received 08/21/18 Aetna MCR & bcbs aka MCD  VVS Policy for Pain - E Signature     Patient Photo   Photo of Patient  HIM Release of Information Output (Deleted) 01/02/18 [facility] Fax Coversheet, Orders and Results, Progress Note  HIM Release of Information Output  09/25/18 Requested records  HIM Release of Information Output (Deleted) 10/01/18 Requested records  HIM Release of Information Output (Deleted) 10/01/18 Requested records  HIM Release of Information Output  10/01/18 Requested records  HIM Release of Information Output  10/01/18 Requested records  HIM Release of Information Output (Deleted) 10/02/18 Requested records  Documents for the Encounter  AOB (Assignment of Insurance Benefits) Received 01/12/19   E-signature AOB     MEDICARE RIGHTS Received 01/12/19   E-signature Medicare Rights     Procedural Consent Signed 01/12/19   ED Patient Billing Extract   ED PB Billing Extract  Medicare Observation  01/13/19   HIM Release of Information Output   [id number]  Cardiac Monitoring Strip Shift Summary Received 01/13/19   After Visit Summary   IP After Visit Summary  After Visit Summary   IP After Visit Summary  EKG Received 01/13/19   Study Attachment for Report   External Report  Admission Information   Current Information   Attending Provider Admitting Provider  Admission Type Admission Status   Mike Griffins, MD Emergency Discharged (Confirmed)       Admission Date/Time Discharge Date Hospital Service Auth/Cert Status  94/32/76 12:14 PM 01/16/19 Acute Care Incomplete        Hospital Area Unit Room/Bed   [facility] AP-DEPT 300 A303/A303-01        Discharge Disposition Discharge Destination  01-Home or [place]  Admission   Complaint  Possible Stroke  Hospital Account   Name Acct ID Class Status Primary Coverage  Mike Nixon [id number] Inpatient Discharged/Not Billed [place]      Guarantor Account (for Hospital Account [id number])   Name Relation to Pt Service Area Active? Acct Type  Mike Nixon Self [facility] Yes Personal/Family  Address Phone    [address] [place] [address] [phone])        Coverage Information (for Hospital Account [id number])   1. AETNA MEDICARE/AETNA MEDICARE HMO/PPO   F/O Payor/Plan Precert #  AETNA MEDICARE/AETNA MEDICARE HMO/PPO   Subscriber Subscriber #  Mike Nixon [facility]  Address [place] [place] [address] [phone]  2. MEDICA[place] OUT OF STATE/[place]   F/O Payor/Plan Precert #  MEDICAID OUT OF STATE/MEDICAID OUT [place] #  Mike Nixon [id number]  Address Phone  [place] [address] [phone]       Care Everywhere ID:  [id number]

## 2019-01-20 ENCOUNTER — Ambulatory Visit (INDEPENDENT_AMBULATORY_CARE_PROVIDER_SITE_OTHER): Payer: Medicare HMO | Admitting: *Deleted

## 2019-01-20 DIAGNOSIS — I639 Cerebral infarction, unspecified: Secondary | ICD-10-CM

## 2019-01-20 DIAGNOSIS — A498 Other bacterial infections of unspecified site: Secondary | ICD-10-CM | POA: Diagnosis not present

## 2019-01-20 DIAGNOSIS — M1711 Unilateral primary osteoarthritis, right knee: Secondary | ICD-10-CM | POA: Diagnosis not present

## 2019-01-20 DIAGNOSIS — N390 Urinary tract infection, site not specified: Secondary | ICD-10-CM | POA: Diagnosis not present

## 2019-01-20 DIAGNOSIS — A419 Sepsis, unspecified organism: Secondary | ICD-10-CM | POA: Diagnosis not present

## 2019-01-21 DIAGNOSIS — M25562 Pain in left knee: Secondary | ICD-10-CM | POA: Diagnosis not present

## 2019-01-21 DIAGNOSIS — G8929 Other chronic pain: Secondary | ICD-10-CM | POA: Diagnosis not present

## 2019-01-21 DIAGNOSIS — M25561 Pain in right knee: Secondary | ICD-10-CM | POA: Diagnosis not present

## 2019-01-21 LAB — CUP PACEART REMOTE DEVICE CHECK
Date Time Interrogation Session: 20200223184002
Implantable Pulse Generator Implant Date: 20170922

## 2019-01-23 ENCOUNTER — Encounter (INDEPENDENT_AMBULATORY_CARE_PROVIDER_SITE_OTHER): Payer: Self-pay | Admitting: Neurology

## 2019-01-23 ENCOUNTER — Other Ambulatory Visit (INDEPENDENT_AMBULATORY_CARE_PROVIDER_SITE_OTHER): Payer: Self-pay | Admitting: Neurology

## 2019-01-23 DIAGNOSIS — I639 Cerebral infarction, unspecified: Secondary | ICD-10-CM

## 2019-01-23 DIAGNOSIS — G20 Parkinson's disease: Secondary | ICD-10-CM

## 2019-01-27 ENCOUNTER — Encounter (INDEPENDENT_AMBULATORY_CARE_PROVIDER_SITE_OTHER): Payer: Self-pay | Admitting: Neurology

## 2019-01-28 NOTE — Progress Notes (Signed)
Carelink Summary Report / Loop Recorder 

## 2019-01-29 DIAGNOSIS — I2510 Atherosclerotic heart disease of native coronary artery without angina pectoris: Secondary | ICD-10-CM | POA: Diagnosis not present

## 2019-01-29 DIAGNOSIS — I10 Essential (primary) hypertension: Secondary | ICD-10-CM | POA: Diagnosis not present

## 2019-01-29 DIAGNOSIS — E782 Mixed hyperlipidemia: Secondary | ICD-10-CM | POA: Diagnosis not present

## 2019-01-29 DIAGNOSIS — M6281 Muscle weakness (generalized): Secondary | ICD-10-CM | POA: Diagnosis not present

## 2019-01-31 ENCOUNTER — Encounter (INDEPENDENT_AMBULATORY_CARE_PROVIDER_SITE_OTHER): Payer: Self-pay | Admitting: Neurology

## 2019-02-05 DIAGNOSIS — M1711 Unilateral primary osteoarthritis, right knee: Secondary | ICD-10-CM | POA: Diagnosis not present

## 2019-02-05 DIAGNOSIS — I679 Cerebrovascular disease, unspecified: Secondary | ICD-10-CM | POA: Diagnosis not present

## 2019-02-05 DIAGNOSIS — G20 Parkinson's disease: Secondary | ICD-10-CM | POA: Diagnosis not present

## 2019-02-05 DIAGNOSIS — I2510 Atherosclerotic heart disease of native coronary artery without angina pectoris: Secondary | ICD-10-CM | POA: Diagnosis not present

## 2019-02-05 DIAGNOSIS — N401 Enlarged prostate with lower urinary tract symptoms: Secondary | ICD-10-CM | POA: Diagnosis not present

## 2019-02-05 DIAGNOSIS — A419 Sepsis, unspecified organism: Secondary | ICD-10-CM | POA: Diagnosis not present

## 2019-02-05 DIAGNOSIS — N390 Urinary tract infection, site not specified: Secondary | ICD-10-CM | POA: Diagnosis not present

## 2019-02-05 DIAGNOSIS — I69351 Hemiplegia and hemiparesis following cerebral infarction affecting right dominant side: Secondary | ICD-10-CM | POA: Diagnosis not present

## 2019-02-05 DIAGNOSIS — I10 Essential (primary) hypertension: Secondary | ICD-10-CM | POA: Diagnosis not present

## 2019-02-05 DIAGNOSIS — M6281 Muscle weakness (generalized): Secondary | ICD-10-CM | POA: Diagnosis not present

## 2019-02-07 DIAGNOSIS — A419 Sepsis, unspecified organism: Secondary | ICD-10-CM | POA: Diagnosis not present

## 2019-02-07 DIAGNOSIS — M6281 Muscle weakness (generalized): Secondary | ICD-10-CM | POA: Diagnosis not present

## 2019-02-07 DIAGNOSIS — I69351 Hemiplegia and hemiparesis following cerebral infarction affecting right dominant side: Secondary | ICD-10-CM | POA: Diagnosis not present

## 2019-02-07 DIAGNOSIS — I10 Essential (primary) hypertension: Secondary | ICD-10-CM | POA: Diagnosis not present

## 2019-02-07 DIAGNOSIS — M1711 Unilateral primary osteoarthritis, right knee: Secondary | ICD-10-CM | POA: Diagnosis not present

## 2019-02-07 DIAGNOSIS — N390 Urinary tract infection, site not specified: Secondary | ICD-10-CM | POA: Diagnosis not present

## 2019-02-07 DIAGNOSIS — N401 Enlarged prostate with lower urinary tract symptoms: Secondary | ICD-10-CM | POA: Diagnosis not present

## 2019-02-07 DIAGNOSIS — G20 Parkinson's disease: Secondary | ICD-10-CM | POA: Diagnosis not present

## 2019-02-07 DIAGNOSIS — I2510 Atherosclerotic heart disease of native coronary artery without angina pectoris: Secondary | ICD-10-CM | POA: Diagnosis not present

## 2019-02-07 DIAGNOSIS — I679 Cerebrovascular disease, unspecified: Secondary | ICD-10-CM | POA: Diagnosis not present

## 2019-02-10 DIAGNOSIS — M1711 Unilateral primary osteoarthritis, right knee: Secondary | ICD-10-CM | POA: Diagnosis not present

## 2019-02-10 DIAGNOSIS — I679 Cerebrovascular disease, unspecified: Secondary | ICD-10-CM | POA: Diagnosis not present

## 2019-02-10 DIAGNOSIS — M6281 Muscle weakness (generalized): Secondary | ICD-10-CM | POA: Diagnosis not present

## 2019-02-10 DIAGNOSIS — I10 Essential (primary) hypertension: Secondary | ICD-10-CM | POA: Diagnosis not present

## 2019-02-10 DIAGNOSIS — I69351 Hemiplegia and hemiparesis following cerebral infarction affecting right dominant side: Secondary | ICD-10-CM | POA: Diagnosis not present

## 2019-02-10 DIAGNOSIS — N401 Enlarged prostate with lower urinary tract symptoms: Secondary | ICD-10-CM | POA: Diagnosis not present

## 2019-02-10 DIAGNOSIS — N390 Urinary tract infection, site not specified: Secondary | ICD-10-CM | POA: Diagnosis not present

## 2019-02-10 DIAGNOSIS — I2510 Atherosclerotic heart disease of native coronary artery without angina pectoris: Secondary | ICD-10-CM | POA: Diagnosis not present

## 2019-02-10 DIAGNOSIS — A419 Sepsis, unspecified organism: Secondary | ICD-10-CM | POA: Diagnosis not present

## 2019-02-10 DIAGNOSIS — G20 Parkinson's disease: Secondary | ICD-10-CM | POA: Diagnosis not present

## 2019-02-11 DIAGNOSIS — I69891 Dysphagia following other cerebrovascular disease: Secondary | ICD-10-CM | POA: Diagnosis not present

## 2019-02-11 DIAGNOSIS — A4152 Sepsis due to Pseudomonas: Secondary | ICD-10-CM | POA: Diagnosis not present

## 2019-02-11 DIAGNOSIS — M6281 Muscle weakness (generalized): Secondary | ICD-10-CM | POA: Diagnosis not present

## 2019-02-11 DIAGNOSIS — N390 Urinary tract infection, site not specified: Secondary | ICD-10-CM | POA: Diagnosis not present

## 2019-02-11 DIAGNOSIS — I679 Cerebrovascular disease, unspecified: Secondary | ICD-10-CM | POA: Diagnosis not present

## 2019-02-11 DIAGNOSIS — I69351 Hemiplegia and hemiparesis following cerebral infarction affecting right dominant side: Secondary | ICD-10-CM | POA: Diagnosis not present

## 2019-02-11 DIAGNOSIS — N183 Chronic kidney disease, stage 3 (moderate): Secondary | ICD-10-CM | POA: Diagnosis not present

## 2019-02-11 DIAGNOSIS — G20 Parkinson's disease: Secondary | ICD-10-CM | POA: Diagnosis not present

## 2019-02-11 DIAGNOSIS — I2510 Atherosclerotic heart disease of native coronary artery without angina pectoris: Secondary | ICD-10-CM | POA: Diagnosis not present

## 2019-02-11 DIAGNOSIS — N401 Enlarged prostate with lower urinary tract symptoms: Secondary | ICD-10-CM | POA: Diagnosis not present

## 2019-02-11 DIAGNOSIS — I10 Essential (primary) hypertension: Secondary | ICD-10-CM | POA: Diagnosis not present

## 2019-02-11 DIAGNOSIS — M1711 Unilateral primary osteoarthritis, right knee: Secondary | ICD-10-CM | POA: Diagnosis not present

## 2019-02-11 DIAGNOSIS — A419 Sepsis, unspecified organism: Secondary | ICD-10-CM | POA: Diagnosis not present

## 2019-02-11 DIAGNOSIS — Z6825 Body mass index (BMI) 25.0-25.9, adult: Secondary | ICD-10-CM | POA: Diagnosis not present

## 2019-02-12 DIAGNOSIS — N401 Enlarged prostate with lower urinary tract symptoms: Secondary | ICD-10-CM | POA: Diagnosis not present

## 2019-02-12 DIAGNOSIS — M1711 Unilateral primary osteoarthritis, right knee: Secondary | ICD-10-CM | POA: Diagnosis not present

## 2019-02-12 DIAGNOSIS — I69351 Hemiplegia and hemiparesis following cerebral infarction affecting right dominant side: Secondary | ICD-10-CM | POA: Diagnosis not present

## 2019-02-12 DIAGNOSIS — A419 Sepsis, unspecified organism: Secondary | ICD-10-CM | POA: Diagnosis not present

## 2019-02-12 DIAGNOSIS — G20 Parkinson's disease: Secondary | ICD-10-CM | POA: Diagnosis not present

## 2019-02-12 DIAGNOSIS — I10 Essential (primary) hypertension: Secondary | ICD-10-CM | POA: Diagnosis not present

## 2019-02-12 DIAGNOSIS — I2510 Atherosclerotic heart disease of native coronary artery without angina pectoris: Secondary | ICD-10-CM | POA: Diagnosis not present

## 2019-02-12 DIAGNOSIS — N390 Urinary tract infection, site not specified: Secondary | ICD-10-CM | POA: Diagnosis not present

## 2019-02-12 DIAGNOSIS — M6281 Muscle weakness (generalized): Secondary | ICD-10-CM | POA: Diagnosis not present

## 2019-02-12 DIAGNOSIS — I679 Cerebrovascular disease, unspecified: Secondary | ICD-10-CM | POA: Diagnosis not present

## 2019-02-13 ENCOUNTER — Other Ambulatory Visit (INDEPENDENT_AMBULATORY_CARE_PROVIDER_SITE_OTHER): Payer: Self-pay | Admitting: Neurology

## 2019-02-13 DIAGNOSIS — G20 Parkinson's disease: Secondary | ICD-10-CM

## 2019-02-13 DIAGNOSIS — N401 Enlarged prostate with lower urinary tract symptoms: Secondary | ICD-10-CM | POA: Diagnosis not present

## 2019-02-13 DIAGNOSIS — M6281 Muscle weakness (generalized): Secondary | ICD-10-CM | POA: Diagnosis not present

## 2019-02-13 DIAGNOSIS — I69351 Hemiplegia and hemiparesis following cerebral infarction affecting right dominant side: Secondary | ICD-10-CM | POA: Diagnosis not present

## 2019-02-13 DIAGNOSIS — I679 Cerebrovascular disease, unspecified: Secondary | ICD-10-CM | POA: Diagnosis not present

## 2019-02-13 DIAGNOSIS — I2510 Atherosclerotic heart disease of native coronary artery without angina pectoris: Secondary | ICD-10-CM | POA: Diagnosis not present

## 2019-02-13 DIAGNOSIS — N390 Urinary tract infection, site not specified: Secondary | ICD-10-CM | POA: Diagnosis not present

## 2019-02-13 DIAGNOSIS — A419 Sepsis, unspecified organism: Secondary | ICD-10-CM | POA: Diagnosis not present

## 2019-02-13 DIAGNOSIS — M1711 Unilateral primary osteoarthritis, right knee: Secondary | ICD-10-CM | POA: Diagnosis not present

## 2019-02-13 DIAGNOSIS — I10 Essential (primary) hypertension: Secondary | ICD-10-CM | POA: Diagnosis not present

## 2019-02-14 ENCOUNTER — Encounter (INDEPENDENT_AMBULATORY_CARE_PROVIDER_SITE_OTHER): Payer: Self-pay | Admitting: Neurology

## 2019-02-17 ENCOUNTER — Telehealth (INDEPENDENT_AMBULATORY_CARE_PROVIDER_SITE_OTHER): Payer: Self-pay

## 2019-02-17 NOTE — Telephone Encounter (Signed)
Request received and beginning investigation     Nuedexta 20-10 mg capsules    Take 1 capsule by mouth twice daily

## 2019-02-17 NOTE — Telephone Encounter (Signed)
Requesting prior authorization for Nuedexta 20-10 mg capsules     PA Outcome APPROVED    Quantity of 60 for a day supply of 30    [employer]    Reference #? [id number]    Dates in effect, from 11/25/2018 through 11/27/2019    Pharmacy [facility] phone number [facility]

## 2019-02-17 NOTE — Telephone Encounter (Signed)
PA Status Update:  .           PA Auth for: Nuedexta 20-10 mg capsules  .           Sig: take 1 capsule by mouth twice daily  .           Status: PENDING    .           Additional Notes: submitted PA via Cover My Meds (Key: [id number]). If [employer] Part D has not replied to your request within 24 hours please contact [employer] D at [phone].

## 2019-02-19 DIAGNOSIS — N390 Urinary tract infection, site not specified: Secondary | ICD-10-CM | POA: Diagnosis not present

## 2019-02-19 DIAGNOSIS — I69351 Hemiplegia and hemiparesis following cerebral infarction affecting right dominant side: Secondary | ICD-10-CM | POA: Diagnosis not present

## 2019-02-19 DIAGNOSIS — A419 Sepsis, unspecified organism: Secondary | ICD-10-CM | POA: Diagnosis not present

## 2019-02-19 DIAGNOSIS — N401 Enlarged prostate with lower urinary tract symptoms: Secondary | ICD-10-CM | POA: Diagnosis not present

## 2019-02-19 DIAGNOSIS — I679 Cerebrovascular disease, unspecified: Secondary | ICD-10-CM | POA: Diagnosis not present

## 2019-02-19 DIAGNOSIS — I2510 Atherosclerotic heart disease of native coronary artery without angina pectoris: Secondary | ICD-10-CM | POA: Diagnosis not present

## 2019-02-19 DIAGNOSIS — I10 Essential (primary) hypertension: Secondary | ICD-10-CM | POA: Diagnosis not present

## 2019-02-19 DIAGNOSIS — M1711 Unilateral primary osteoarthritis, right knee: Secondary | ICD-10-CM | POA: Diagnosis not present

## 2019-02-19 DIAGNOSIS — G20 Parkinson's disease: Secondary | ICD-10-CM | POA: Diagnosis not present

## 2019-02-19 DIAGNOSIS — M6281 Muscle weakness (generalized): Secondary | ICD-10-CM | POA: Diagnosis not present

## 2019-02-21 ENCOUNTER — Other Ambulatory Visit: Payer: Self-pay

## 2019-02-21 ENCOUNTER — Ambulatory Visit (INDEPENDENT_AMBULATORY_CARE_PROVIDER_SITE_OTHER): Payer: Medicare HMO | Admitting: *Deleted

## 2019-02-21 ENCOUNTER — Encounter (INDEPENDENT_AMBULATORY_CARE_PROVIDER_SITE_OTHER): Payer: Self-pay

## 2019-02-21 DIAGNOSIS — A419 Sepsis, unspecified organism: Secondary | ICD-10-CM | POA: Diagnosis not present

## 2019-02-21 DIAGNOSIS — I679 Cerebrovascular disease, unspecified: Secondary | ICD-10-CM | POA: Diagnosis not present

## 2019-02-21 DIAGNOSIS — I10 Essential (primary) hypertension: Secondary | ICD-10-CM | POA: Diagnosis not present

## 2019-02-21 DIAGNOSIS — I639 Cerebral infarction, unspecified: Secondary | ICD-10-CM

## 2019-02-21 DIAGNOSIS — N401 Enlarged prostate with lower urinary tract symptoms: Secondary | ICD-10-CM | POA: Diagnosis not present

## 2019-02-21 DIAGNOSIS — N390 Urinary tract infection, site not specified: Secondary | ICD-10-CM | POA: Diagnosis not present

## 2019-02-21 DIAGNOSIS — G20 Parkinson's disease: Secondary | ICD-10-CM | POA: Diagnosis not present

## 2019-02-21 DIAGNOSIS — M6281 Muscle weakness (generalized): Secondary | ICD-10-CM | POA: Diagnosis not present

## 2019-02-21 DIAGNOSIS — I2510 Atherosclerotic heart disease of native coronary artery without angina pectoris: Secondary | ICD-10-CM | POA: Diagnosis not present

## 2019-02-21 DIAGNOSIS — I69351 Hemiplegia and hemiparesis following cerebral infarction affecting right dominant side: Secondary | ICD-10-CM | POA: Diagnosis not present

## 2019-02-21 DIAGNOSIS — M1711 Unilateral primary osteoarthritis, right knee: Secondary | ICD-10-CM | POA: Diagnosis not present

## 2019-02-22 LAB — CUP PACEART REMOTE DEVICE CHECK
Date Time Interrogation Session: 20200327193947
Implantable Pulse Generator Implant Date: 20170922

## 2019-02-24 NOTE — Progress Notes (Signed)
Carelink Summary Report / Loop Recorder 

## 2019-02-28 DIAGNOSIS — A4152 Sepsis due to Pseudomonas: Secondary | ICD-10-CM | POA: Diagnosis not present

## 2019-02-28 DIAGNOSIS — Z6825 Body mass index (BMI) 25.0-25.9, adult: Secondary | ICD-10-CM | POA: Diagnosis not present

## 2019-02-28 DIAGNOSIS — N401 Enlarged prostate with lower urinary tract symptoms: Secondary | ICD-10-CM | POA: Diagnosis not present

## 2019-02-28 DIAGNOSIS — I69891 Dysphagia following other cerebrovascular disease: Secondary | ICD-10-CM | POA: Diagnosis not present

## 2019-02-28 DIAGNOSIS — N183 Chronic kidney disease, stage 3 (moderate): Secondary | ICD-10-CM | POA: Diagnosis not present

## 2019-02-28 DIAGNOSIS — G20 Parkinson's disease: Secondary | ICD-10-CM | POA: Diagnosis not present

## 2019-02-28 DIAGNOSIS — I10 Essential (primary) hypertension: Secondary | ICD-10-CM | POA: Diagnosis not present

## 2019-03-14 ENCOUNTER — Telehealth: Payer: Self-pay

## 2019-03-14 NOTE — Telephone Encounter (Signed)
Spoke with patient to remind of missed remote transmission 

## 2019-03-18 ENCOUNTER — Encounter (INDEPENDENT_AMBULATORY_CARE_PROVIDER_SITE_OTHER): Payer: Self-pay | Admitting: Neurology

## 2019-03-25 ENCOUNTER — Encounter (INDEPENDENT_AMBULATORY_CARE_PROVIDER_SITE_OTHER): Payer: Self-pay

## 2019-03-26 ENCOUNTER — Ambulatory Visit (INDEPENDENT_AMBULATORY_CARE_PROVIDER_SITE_OTHER): Payer: Medicare HMO | Admitting: *Deleted

## 2019-03-26 ENCOUNTER — Other Ambulatory Visit: Payer: Self-pay

## 2019-03-26 DIAGNOSIS — R609 Edema, unspecified: Secondary | ICD-10-CM | POA: Diagnosis not present

## 2019-03-26 DIAGNOSIS — I639 Cerebral infarction, unspecified: Secondary | ICD-10-CM

## 2019-03-26 DIAGNOSIS — Z6826 Body mass index (BMI) 26.0-26.9, adult: Secondary | ICD-10-CM | POA: Diagnosis not present

## 2019-03-27 DIAGNOSIS — R609 Edema, unspecified: Secondary | ICD-10-CM | POA: Diagnosis not present

## 2019-03-27 LAB — CUP PACEART REMOTE DEVICE CHECK
Date Time Interrogation Session: 20200429200551
Implantable Pulse Generator Implant Date: 20170922

## 2019-04-04 NOTE — Progress Notes (Signed)
Carelink Summary Report / Loop Recorder 

## 2019-04-07 ENCOUNTER — Encounter (INDEPENDENT_AMBULATORY_CARE_PROVIDER_SITE_OTHER): Payer: Self-pay | Admitting: Neurology

## 2019-04-07 ENCOUNTER — Telehealth (INDEPENDENT_AMBULATORY_CARE_PROVIDER_SITE_OTHER): Payer: Medicare (Managed Care) | Admitting: Neurology

## 2019-04-07 DIAGNOSIS — I639 Cerebral infarction, unspecified: Secondary | ICD-10-CM

## 2019-04-07 DIAGNOSIS — F482 Pseudobulbar affect: Secondary | ICD-10-CM

## 2019-04-07 DIAGNOSIS — R270 Ataxia, unspecified: Secondary | ICD-10-CM

## 2019-04-07 DIAGNOSIS — G20 Parkinson's disease: Secondary | ICD-10-CM

## 2019-04-07 DIAGNOSIS — Z72820 Sleep deprivation: Secondary | ICD-10-CM

## 2019-04-07 DIAGNOSIS — R471 Dysarthria and anarthria: Secondary | ICD-10-CM

## 2019-04-07 DIAGNOSIS — R69 Illness, unspecified: Secondary | ICD-10-CM | POA: Diagnosis not present

## 2019-04-07 NOTE — Progress Notes (Signed)
Subjective:     [facility]OM TELEHEALTH VISIT (secondary to COVID-19 precautionary measures)    Verbal consent has been obtained from the patient to conduct a telehealth visit encounter to minimize exposure to COVID-19: yes    -------------------------------    78 y/o M with hx HTN, HLD, borderline DM, glaucoma, prior CVA, presents to Movement Disorders clinic for f/u of possible Parkinson's Disease.     Now on Nourianz 20mg  daily which is helping with his gait.  His foot hurts, worried he has broken it. No LH/D reported.  Was working on SLT before but had issue finding someone where they live. Did have some PT/OT at home but held once COVID-19.    Days and nights remain flipped, sleeping a lot during the day. Also his family feels his day/night are swapped, can stay up all night and sleep all day. Still on Rytary 3 capsules of 145mg  4x daily, 5 hours apart. On Nuedexta now, which has resolved his crying episodes. Tolerating with no issue.     Denies heavy drinking, smoking, and drug use. Counseled to abstain from smoking. No contributory family history.      History retained from initial encounter:     History is mainly from family. He was diagnosed with Parkinson's Disease in 2015, with initial symptoms being changes to his gait.  He was walking slower and his movements were slowed overall.  They report also noting tremors in the legs and hands, unclear laterality. He noticed the tremors throughout the day, but the daughter notices it more with activity, when feeding himself.     The diagnosis of Parkinson's was considered, so he was started on Sinemet, however did not take it because it gave him GI issues. During that year, he had a number of TIAs, but then in 2016 had a completed CVA.  Described a 'clot in the back of his head' by family.  Afterwards, he was having issues with speech and swallowing. Swallow study was normal.     At some point then, the dx of Parkinson's was questioned, but "tests" were completed  that confirmed, though unclear what that was.  Was put back on Sinemet.  Once he is on Sinemet, his walking and tightness improves, but they are concerned about progression as he is falling frequently now. No cognitive changes, just difficulty talking.    Weak spells happen in the morning, he tells family that he's weak. Can get to lie down, but doesn't feel dizzy, woozy or lightheaded.  He feels that he starts to feel weak after the medicine.     Last complete PT in December last year. He has completed SLT which helped.    + uncontrollable laughing and crying noted, depression/anxiety and fatigue.     DATscan reviewed (08/10/2016):    Abnormal image grade 3: Virtually absent uptake bilaterally affecting both putamen and caudate nuclei.  This pattern of activity can be seen with Parkinson's disease or related syndromes.    No family history of Parkinson's Disease or tremor. No history significant head injury or LOC.  No history of neuroleptic or chemical exposure.     Prior history reviewed:    Crixus Mcaulay was seen today in the movement disorders clinic for neurologic consultation at the request of [name], MD. The consultation is for the evaluation of PD. No records are available to me from his prior neurologist. Therefore, all history was obtained from the patient and wife. He was previously being seen in [place], [place]. He  was dx with with PD about 3 years ago and wife states that his first sx was was slow handwriting and slow to eat and slight jaw tremor. He was started on carbidopa/levodopa 10/100 tid and pt doesn't think that it helped. It was taking it at 8am/1pm/8-9pm. Lunchtime is at noon. He takes the last at bedtime. However, he admits that he has not taken it for months.    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 When she got to him, he was just laying on the floor. They called 911. He was not on ASA at the time. He was put on plavix and lipitor.  Speech has been biggest issue since the stroke. States that he never went through speech therapy, even when he was in rehab. He did physical therapy only. R leg remains a bit weak after stroke.    07/18/16 update: The patient is seen today in follow-up. I have reviewed many records from several hospitalizations and also his prior neurologist records since our last visit. He was first seen by his prior neurologist in [place] on 06/15/2014 with complaints of speech changes and dragging his right leg and trouble buttoning his clothing. On examination, she reports no bradykinesia at that visit, but she reports that he had lip tremor. He was dragging his right leg and his rapid alternating movements were normal. She did an MRI of the brain just following that visit and this was done on 07/06/2014. There was a small acute right basal ganglia infarct. He followed up with her just after that in August, 2015 and was started on levodopa, 10/100 3 times per day, as she felt that his infarct was acute, whereas his symptoms were for 6 months and he had right leg symptoms and right sided infarct. 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 In September, 2015 the notes said that the patient noted significant improvement on aspirin and a statin. He was then hospitalized in May, 2016 and she followed up with him after that and said that there was just mild slurring of speech. On 07/19/2015 she did have him wearing an event monitor for one month that was negative. It does appear that he had been off of levodopa ever since his infarct in May, 2016. I was able to get his hospital records from [facility] from his infarction. He was admitted on 04/08/2015, but they reported left-sided weakness and dizziness. He had an echocardiogram on 04/09/2015 with an ejection fraction of 60-65%. An EEG was normal. An MRA of the neck was normal. An MRI of the brain stated that DWI appears to demonstrate multiple punctate areas of high  signal involving the cerebellar hemispheres and cerebellar vermis. I did have the opportunity to review these images once they became available, and DWI was in fact positive in both cerebellar hemispheres and amongst the vermis. His carotid ultrasound was negative. There were minimal notes regarding his speech but nothing stated that it was particularly slurred. He was then recently hospitalized once again more on 07/10/2016 and I again reviewed those records. He was hospitalized for inability to swallow. Pt states that swallowing issues are with liquids and not solids because he was trying to take too much in the mouth at once. An MRI without gadolinium was done. I did not get those images. There was reported to be a few old cerebellar infarctions. There is reported to be an old infarction in  the left frontal region. DWI was negative. It was concluded that his swallowing issues were from Parkinsons disease and his refusal to take levodopa. He did have a barium swallow that was reported to show mild oropharyngeal dysphagia, but no dietary changes were made, with the exceptions of to take small bites and administer meds one at a time. He ended up having a very detailed EMG at our office following these hospitalizations to rule out motor neuron disease and there was no evidence of this.    09/08/16 update: Pt returns accompanied by his wife who supplements the history. Since our last visit the patient had a DaT scan in 07/2016 and it was abnormal demonstrating decreased uptake, being virtually absent in the bilateral caudate and the putamen. Wife does state that when he was last hospitalized, he had been given levodopa and it helped but was d/c in the hospital. He also saw Dr. Johney Frame and had a TEE on 08/18/16 and that was normal with the exception of evidence of a PFO. He had a LE doppler on 08/18/16 and that was normal. He had a loop recorder implanted on 08/18/16 and as of 08/28/16 there were no episodes of atrial  fibrillation.     The following portions of the patient's history were reviewed and updated as appropriate: allergies, current medications, past medical history, past social history, past surgical history and problem list.    Review of Systems  + vocal changes, gait disorder.  No recent illness. Denies fever, chills, cough, sinus pain, eye pain, eye redness, ear pain, rhinorrhea, sore throat, chest pain, SOB, wheezing, abd pain, Nausea, Vomiting, diarrhea, constipation, dysuria, or rashes.  All other systems reviewed and are negative except as previously noted in the HPI.    Objective:     Height 9ft7, Weight 160lb  Constitutional: NAD   Eyes: Clear conjunctiva  Musculoskeletal: stooped posture, normal muscle bulk.  Integumentary: No abnormal rash noted  Psych: Flat affect, decreased blink rate. No more emotionality noted.     Neurological:   Mental Status: Alert & oriented to person, place, month, & year.   Language: Dysarthric. Difficult to understand.      Cranial Nerves:  II, III: PERRL  III, IV, VI: Mild limitation to vertical gaze, No nystagmus, no palsies, no ptosis  V: Intact to LT V1-V3 distribution bilaterally.   VI: Symmetrical face and expression.   VIII: Hearing intact to finger rub bilaterally.   IX, X: Palate/Uvula elevates symmetrically.   XI: Trapezius & SCM elevates bilaterally.   XII: Tongue is midline.     Motor Exam: No motor deficits identified.      Sensation: Sensation reported intact to LT grossly through symmetrically. Romberg negative.    Cerebellar:   Finger to Nose: intact bilaterally with mild terminal dysmetria on FTN.    Reflexes: Deferred due to ZOOM telehealth visit.     Station/ Gait: Wider based, short stepped.  Shuffle.  Absent arm swing bilaterally, arms out.  Stooped. No freezing noted.     Fine motor use of both hands is slowed to RAM, finger tapping and open/close, R>L but more fluid with no freezing.  Foot tapping also reduced in this laterality.     The following was  discussed with patient:   Exercise -- even 20 minutes 3x per week makes a difference   Alcohol   Tobacco. Smoking is a major risk factor for cardiovascular disease   Diet   Consider PSG   Treat AF  Meds   Antiplatelet or anticoagulant for ischemic stroke. Risk, benefit, toxicity was discussed.    Statins reduce the risk of risk. Risk, benefit, toxicity discussed.   Controlling your blood pressure is important   Signs and symptoms of stroke   Call 911 for stroke like symptoms    Since this patient has had a well documented stroke or cardiovascular risk factors it is paramount that continuing secondary stroke prevention measures be applied and reinforced regularly.     EPWORTH SLEEPINESS SCALE  How Likely Are You to Fall Asleep    Sitting and reading?      1- Slight chance of dozing or sleeping  Watching TV?       1- Slight chance of dozing or sleeping  Sitting inactive in a Public Space?    1- Slight chance of dozing or sleeping  Being a passenger in a car for an hour or more?  1- Slight chance of dozing or sleeping  Sitting quietly after lunch (no alcohol)?   1- Slight chance of dozing or sleeping  Stopped for a few minutes in traffic while driving?  1- Slight chance of dozing or sleeping  Total Score       6   Score 0-9 = Not sleepy   Score 10-17  = Moderate sleepy   Score 18+  = Very sleepy    Assessment:     78 y/o M with hx HTN, HLD, borderline DM, glaucoma, prior CVA, presents to Movement Disorders clinic for f/u of Parkinson's Disease. 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 It is unclear what symptoms are CVA related and which are PD related.      Now, freezing is improved on Nourianz but unmasking the imbalance from his prior strokes.  Needs PT/OT once safe.  Will refer.  His gait is very affected as is his balance, and bathing is a challenge.  I  consider a walk-in shower of medical necessity given his limitations.     We also discussed the need to follow with cardiology as he is doing.  He does have unpredictable episodes of quick, shortness of breath.  Unlikely that this is related to his Parkinson's disease, but if cleared by cardiology and pulmonology, can consider an as needed therapy.    Also discussed the impact of his prior stroke and secondary stroke prevention, with screening completed as above.     Sleep is an issue still, with day/night reversal. Suggested melatonin nightly and if not better, can add a low dose of clonazepam.     Plan:     1) Parkinson's Disease -   - Continue Rytary to 3 capsules 145mg  4x daily, 5 hours apart.  - Continue Nourianz 20mg  daily.  - Cannot have MAO-B inhibitors d/t Nuedexta use.     2) Gait disorder - multifactorial; PD, comorbid medical issues, prior CVA.  A severe, chronic progressing condition. Fall risk   - Restart PT/OT/SLT when safe and continue exercises.   - Referred to home health for services when safe.  - Discussed fall risk at length.     3) Dysarthria -   - Continue SLT exercises.     4) Prior CVA - continue Plavix and statin for secondary stroke prevention.  - screening as above.     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     RTC in 3 mo. Patient can follow up sooner if needed. In the meantime, patient will contact the office with any questions or concerns.     -----------------------------------------------------------    Elenore Paddy, MD  Co-Director, Movement Disorders Specialist  [facility]  [facility]    [address] [place]  [address]  [place] [address]    [place], [place] [address]  T [phone]   F [phone]   T [phone]   F [phone]     [email or web address]

## 2019-04-08 DIAGNOSIS — M79671 Pain in right foot: Secondary | ICD-10-CM | POA: Diagnosis not present

## 2019-04-08 DIAGNOSIS — M109 Gout, unspecified: Secondary | ICD-10-CM | POA: Diagnosis not present

## 2019-04-16 ENCOUNTER — Telehealth (INDEPENDENT_AMBULATORY_CARE_PROVIDER_SITE_OTHER): Payer: Medicare (Managed Care) | Admitting: Neurology

## 2019-04-16 ENCOUNTER — Ambulatory Visit (INDEPENDENT_AMBULATORY_CARE_PROVIDER_SITE_OTHER): Payer: Medicare (Managed Care) | Admitting: Neurology

## 2019-04-23 ENCOUNTER — Ambulatory Visit (INDEPENDENT_AMBULATORY_CARE_PROVIDER_SITE_OTHER): Payer: Medicare HMO | Admitting: Urology

## 2019-04-23 DIAGNOSIS — C679 Malignant neoplasm of bladder, unspecified: Secondary | ICD-10-CM | POA: Diagnosis not present

## 2019-04-28 ENCOUNTER — Ambulatory Visit (INDEPENDENT_AMBULATORY_CARE_PROVIDER_SITE_OTHER): Payer: Medicare HMO | Admitting: *Deleted

## 2019-04-28 DIAGNOSIS — I639 Cerebral infarction, unspecified: Secondary | ICD-10-CM

## 2019-04-29 DIAGNOSIS — I998 Other disorder of circulatory system: Secondary | ICD-10-CM | POA: Diagnosis not present

## 2019-04-29 DIAGNOSIS — R609 Edema, unspecified: Secondary | ICD-10-CM | POA: Diagnosis not present

## 2019-04-29 DIAGNOSIS — M79671 Pain in right foot: Secondary | ICD-10-CM | POA: Diagnosis not present

## 2019-04-29 LAB — CUP PACEART REMOTE DEVICE CHECK
Date Time Interrogation Session: 20200601201127
Implantable Pulse Generator Implant Date: 20170922

## 2019-05-05 NOTE — Progress Notes (Signed)
Carelink Summary Report / Loop Recorder 

## 2019-05-12 DIAGNOSIS — M7989 Other specified soft tissue disorders: Secondary | ICD-10-CM | POA: Diagnosis not present

## 2019-05-12 DIAGNOSIS — R600 Localized edema: Secondary | ICD-10-CM | POA: Diagnosis not present

## 2019-06-02 ENCOUNTER — Ambulatory Visit (INDEPENDENT_AMBULATORY_CARE_PROVIDER_SITE_OTHER): Payer: Medicare HMO | Admitting: *Deleted

## 2019-06-02 DIAGNOSIS — I639 Cerebral infarction, unspecified: Secondary | ICD-10-CM | POA: Diagnosis not present

## 2019-06-02 LAB — CUP PACEART REMOTE DEVICE CHECK
Date Time Interrogation Session: 20200704203745
Implantable Pulse Generator Implant Date: 20170922

## 2019-06-05 ENCOUNTER — Other Ambulatory Visit (INDEPENDENT_AMBULATORY_CARE_PROVIDER_SITE_OTHER): Payer: Self-pay

## 2019-06-05 DIAGNOSIS — G20 Parkinson's disease: Secondary | ICD-10-CM

## 2019-06-05 MED ORDER — RYTARY 36.25-145 MG PO CPCR
3.00 | ORAL_CAPSULE | Freq: Four times a day (QID) | ORAL | 3 refills | Status: DC
Start: 2019-06-05 — End: 2020-11-08

## 2019-06-06 ENCOUNTER — Encounter (INDEPENDENT_AMBULATORY_CARE_PROVIDER_SITE_OTHER): Payer: Self-pay | Admitting: Neurology

## 2019-06-09 ENCOUNTER — Telehealth (INDEPENDENT_AMBULATORY_CARE_PROVIDER_SITE_OTHER): Payer: Self-pay

## 2019-06-09 NOTE — Telephone Encounter (Signed)
Requesting prior authorization for Rytary 145 mg capsules     PA Outcome APPROVED    Quantity of 1080 for a day supply of [address] Medicare    Reference #? [id number]    Dates in effect, from 11/25/2018 through 11/27/2019    [facility]

## 2019-06-09 NOTE — Telephone Encounter (Signed)
Request received and beginning investigation     Rytary 145 mg capsules    Take 3 capsules 4 times daily

## 2019-06-09 NOTE — Telephone Encounter (Signed)
PA Status Update:  .           PA Auth for: Rytary 145 mg capsules  .           Sig: take 3 capsules 4 times daily  .           Status: PENDING     .           Additional Notes: submitted PA via [employer] (Key: [id number]). If [employer] Part D has not replied to your request within 24 hours please contact [employer] Part D at [phone].

## 2019-06-09 NOTE — Progress Notes (Signed)
Carelink Summary Report / Loop Recorder 

## 2019-07-03 ENCOUNTER — Ambulatory Visit (INDEPENDENT_AMBULATORY_CARE_PROVIDER_SITE_OTHER): Payer: Medicare HMO | Admitting: *Deleted

## 2019-07-03 DIAGNOSIS — I639 Cerebral infarction, unspecified: Secondary | ICD-10-CM | POA: Diagnosis not present

## 2019-07-03 LAB — CUP PACEART REMOTE DEVICE CHECK
Date Time Interrogation Session: 20200806185602
Implantable Pulse Generator Implant Date: 20170922

## 2019-07-08 NOTE — Progress Notes (Signed)
Carelink Summary Report / Loop Recorder 

## 2019-07-23 ENCOUNTER — Ambulatory Visit (INDEPENDENT_AMBULATORY_CARE_PROVIDER_SITE_OTHER): Payer: Medicare HMO | Admitting: Urology

## 2019-07-23 DIAGNOSIS — C673 Malignant neoplasm of anterior wall of bladder: Secondary | ICD-10-CM

## 2019-08-05 ENCOUNTER — Ambulatory Visit (INDEPENDENT_AMBULATORY_CARE_PROVIDER_SITE_OTHER): Payer: Medicare HMO | Admitting: *Deleted

## 2019-08-05 ENCOUNTER — Telehealth: Payer: Self-pay | Admitting: Internal Medicine

## 2019-08-05 ENCOUNTER — Telehealth (HOSPITAL_COMMUNITY): Payer: Self-pay | Admitting: *Deleted

## 2019-08-05 DIAGNOSIS — I639 Cerebral infarction, unspecified: Secondary | ICD-10-CM

## 2019-08-05 NOTE — Telephone Encounter (Signed)
Pt wife called and left message on AF clinic voicemail regarding questions of linq and possible removal. Please call to discuss at [phone]

## 2019-08-05 NOTE — Telephone Encounter (Signed)
Informed battery on LINQ lasts approximately 3 yrs and we will notify patient when battery reaches ERI and schedule appointment with Dr Rayann Heman to take oout device at that time.

## 2019-08-05 NOTE — Telephone Encounter (Signed)
New Message    Patient's wife calling in wanting to know when the battery will need to be replace in Loop recorder please call patient back.

## 2019-08-05 NOTE — Telephone Encounter (Signed)
Per review of chart, Pt wife also called device clinic.  Was advised loop battery lasted 3 years and family would be notified when battery was expired so loop may be removed.  No further action needed at this time.

## 2019-08-06 LAB — CUP PACEART REMOTE DEVICE CHECK
Date Time Interrogation Session: 20200908220936
Implantable Pulse Generator Implant Date: 20170922

## 2019-08-18 ENCOUNTER — Other Ambulatory Visit (INDEPENDENT_AMBULATORY_CARE_PROVIDER_SITE_OTHER): Payer: Self-pay

## 2019-08-18 DIAGNOSIS — F482 Pseudobulbar affect: Secondary | ICD-10-CM

## 2019-08-18 MED ORDER — DEXTROMETHORPHAN-QUINIDINE 20-10 MG PO CAPS
1.00 | ORAL_CAPSULE | Freq: Two times a day (BID) | ORAL | 3 refills | Status: DC
Start: 2019-08-18 — End: 2020-11-08

## 2019-08-20 NOTE — Progress Notes (Signed)
Carelink Summary Report / Loop Recorder 

## 2019-08-26 ENCOUNTER — Telehealth (INDEPENDENT_AMBULATORY_CARE_PROVIDER_SITE_OTHER): Payer: 59 | Admitting: Family

## 2019-08-27 ENCOUNTER — Telehealth (INDEPENDENT_AMBULATORY_CARE_PROVIDER_SITE_OTHER): Payer: Medicare (Managed Care) | Admitting: Family

## 2019-08-27 ENCOUNTER — Telehealth (INDEPENDENT_AMBULATORY_CARE_PROVIDER_SITE_OTHER): Payer: 59 | Admitting: Family

## 2019-08-27 DIAGNOSIS — G20 Parkinson's disease: Secondary | ICD-10-CM

## 2019-08-27 DIAGNOSIS — R2689 Other abnormalities of gait and mobility: Secondary | ICD-10-CM

## 2019-08-27 DIAGNOSIS — R270 Ataxia, unspecified: Secondary | ICD-10-CM

## 2019-08-27 DIAGNOSIS — I639 Cerebral infarction, unspecified: Secondary | ICD-10-CM

## 2019-08-27 DIAGNOSIS — F482 Pseudobulbar affect: Secondary | ICD-10-CM

## 2019-08-27 DIAGNOSIS — R471 Dysarthria and anarthria: Secondary | ICD-10-CM

## 2019-08-27 NOTE — Progress Notes (Signed)
Subjective:     ZOOM TELEHEALTH VISIT (secondary to COVID-19 precautionary measures)    Verbal consent has been obtained from the patient to conduct a telehealth visit encounter to minimize exposure to COVID-19: yes    -------------------------------    78 y/o M with hx HTN, HLD, borderline DM, glaucoma, prior CVA, presents to Movement Disorders clinic for f/u of Parkinson's Disease.     Patient is staying with daughter who has noted a decline in gait the last 2 weeks. He is taking Nourianz 20mg  daily, but he has continued to decline. The daughter describes the gait as weaker, needing a walker all the time with assistance. Now, walking with walker for short distances with assistance-sometimes they have to help him lift his foot. Feet are hesitant and legs are shaking/ bouncing when he takes a step. Overall dragging both legs R>L. He has some falls. He cannot get himself up. Was working on SLT before but had issue finding someone where they live. Did have some PT/OT at home but held once COVID-19.    Since staying with daughter, he is awake during the day and sleeps at night. She expresses concern that when he goes home he will be less stimulated during the day and sleep, then be awake during the night.  Still on Rytary 3 capsules of 145mg  4x daily, 5 hours apart. On Nuedexta now, which has resolved his crying episodes. Tolerating with no issue.     Denies heavy drinking, smoking, and drug use.      History retained from initial encounter:     History is mainly from family. He was diagnosed with Parkinson's Disease in 2015, with initial symptoms being changes to his gait.  He was walking slower and his movements were slowed overall.  They report also noting tremors in the legs and hands, unclear laterality. He noticed the tremors throughout the day, but the daughter notices it more with activity, when feeding himself.     The diagnosis of Parkinson's was considered, so he was started on Sinemet, however did not  take it because it gave him GI issues. During that year, he had a number of TIAs, but then in 2016 had a completed CVA.  Described a 'clot in the back of his head' by family.  Afterwards, he was having issues with speech and swallowing. Swallow study was normal.     At some point then, the dx of Parkinson's was questioned, but "tests" were completed that confirmed, though unclear what that was.  Was put back on Sinemet.  Once he is on Sinemet, his walking and tightness improves, but they are concerned about progression as he is falling frequently now. No cognitive changes, just difficulty talking.    Weak spells happen in the morning, he tells family that he's weak. Can get to lie down, but doesn't feel dizzy, woozy or lightheaded.  He feels that he starts to feel weak after the medicine.     Last complete PT in December last year. He has completed SLT which helped.    + uncontrollable laughing and crying noted, depression/anxiety and fatigue.     DATscan reviewed (08/10/2016):    Abnormal image grade 3: Virtually absent uptake bilaterally affecting both putamen and caudate nuclei.  This pattern of activity can be seen with Parkinson's disease or related syndromes.    No family history of Parkinson's Disease or tremor. No history significant head injury or LOC.  No history of neuroleptic or chemical exposure.  Prior history reviewed:    Brian Escobar was seen today in the movement disorders clinic for neurologic consultation at the request of [name], MD. The consultation is for the evaluation of PD. No records are available to me from his prior neurologist. Therefore, all history was obtained from the patient and wife. He was previously being seen in [place], [place]. He was dx with with PD about 3 years ago and wife states that his first sx was was slow handwriting and slow to eat and slight jaw tremor. He was started on carbidopa/levodopa 10/100 tid and pt doesn't think that it helped. It was taking it at  8am/1pm/8-9pm. Lunchtime is at noon. He takes the last at bedtime. However, he admits that he has not taken it for months.    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 When she got to him, he was just laying on the floor. They called 911. He was not on ASA at the time. He was put on plavix and lipitor. Speech has been biggest issue since the stroke. States that he never went through speech therapy, even when he was in rehab. He did physical therapy only. R leg remains a bit weak after stroke.    07/18/16 update: The patient is seen today in follow-up. I have reviewed many records from several hospitalizations and also his prior neurologist records since our last visit. He was first seen by his prior neurologist in [place] on 06/15/2014 with complaints of speech changes and dragging his right leg and trouble buttoning his clothing. On examination, she reports no bradykinesia at that visit, but she reports that he had lip tremor. He was dragging his right leg and his rapid alternating movements were normal. She did an MRI of the brain just following that visit and this was done on 07/06/2014. There was a small acute right basal ganglia infarct. He followed up with her just after that in August, 2015 and was started on levodopa, 10/100 3 times per day, as she felt that his infarct was acute, whereas his symptoms were for 6 months and he had right leg symptoms and right sided infarct. 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 In September, 2015 the notes said that the patient noted significant improvement on aspirin and a statin. He was then hospitalized in May, 2016 and she followed up with him after that and said that there was just mild slurring of speech. On 07/19/2015 she did have him wearing an event monitor for one month that was negative. It does appear that he had been off of levodopa ever since his infarct in May, 2016. I was able to get his hospital  records from [facility] from his infarction. He was admitted on 04/08/2015, but they reported left-sided weakness and dizziness. He had an echocardiogram on 04/09/2015 with an ejection fraction of 60-65%. An EEG was normal. An MRA of the neck was normal. An MRI of the brain stated that DWI appears to demonstrate multiple punctate areas of high signal involving the cerebellar hemispheres and cerebellar vermis. I did have the opportunity to review these images once they became available, and DWI was in fact positive in both cerebellar hemispheres and amongst the vermis. His carotid ultrasound was negative. There were minimal notes regarding his speech but nothing stated that it was particularly slurred. He was then recently hospitalized once again more on 07/10/2016 and I again reviewed those  records. He was hospitalized for inability to swallow. Pt states that swallowing issues are with liquids and not solids because he was trying to take too much in the mouth at once. An MRI without gadolinium was done. I did not get those images. There was reported to be a few old cerebellar infarctions. There is reported to be an old infarction in the left frontal region. DWI was negative. It was concluded that his swallowing issues were from Parkinsons disease and his refusal to take levodopa. He did have a barium swallow that was reported to show mild oropharyngeal dysphagia, but no dietary changes were made, with the exceptions of to take small bites and administer meds one at a time. He ended up having a very detailed EMG at our office following these hospitalizations to rule out motor neuron disease and there was no evidence of this.    09/08/16 update: Pt returns accompanied by his wife who supplements the history. Since our last visit the patient had a DaT scan in 07/2016 and it was abnormal demonstrating decreased uptake, being virtually absent in the bilateral caudate and the putamen. Wife does state that when  he was last hospitalized, he had been given levodopa and it helped but was d/c in the hospital. He also saw Dr. Johney Frame and had a TEE on 08/18/16 and that was normal with the exception of evidence of a PFO. He had a LE doppler on 08/18/16 and that was normal. He had a loop recorder implanted on 08/18/16 and as of 08/28/16 there were no episodes of atrial fibrillation.     The following portions of the patient's history were reviewed and updated as appropriate: allergies, current medications, past medical history, past social history, past surgical history and problem list.    Review of Systems  + vocal changes, gait disorder.  No recent illness. Denies fever, chills, cough, sinus pain, eye pain, eye redness, ear pain, rhinorrhea, sore throat, chest pain, SOB, wheezing, abd pain, Nausea, Vomiting, diarrhea, constipation, dysuria, or rashes.  All other systems reviewed and are negative except as previously noted in the HPI.    Objective:     Height 65ft7, Weight 160lb  Constitutional: NAD   Eyes: Clear conjunctiva  Musculoskeletal: stooped posture, normal muscle bulk.  Integumentary: No abnormal rash noted  Psych: Flat affect, decreased blink rate. No more emotionality noted.     Neurological:   Mental Status: Alert & oriented to person, place, month, & year.   Language: Dysarthric. Difficult to understand.      Cranial Nerves:  III, IV, VI: Mild limitation to vertical gaze, No nystagmus, no palsies, no ptosis  V: Intact to LT V1-V3 distribution bilaterally.   VI: Symmetrical face and expression.   VIII: Hearing intact to conversation  IX, X: Palate/Uvula elevates symmetrically.   XI: Trapezius & SCM elevates bilaterally.   XII: Tongue is midline.     Motor Exam: No motor deficits identified.      Sensation: Sensation reported intact to LT grossly through symmetrically. Romberg negative.    Cerebellar:   Finger to Nose: intact bilaterally with mild terminal dysmetria on FTN.    Reflexes: Deferred due to ZOOM telehealth  visit.     Station/ Gait: Wider based, short stepped.  Shuffle. Difficulty picking up feet and now has a shaking/bouncing characteristic of the legs when stepping forward.  Absent arm swing bilaterally, arms out.  Stooped. No freezing noted.     Fine motor use of both hands is slowed  to RAM, finger tapping and open/close, R>L but more fluid with no freezing.  Foot tapping also reduced in this laterality.     The following was discussed with patient:   Exercise -- even 20 minutes 3x per week makes a difference   Alcohol   Tobacco. Smoking is a major risk factor for cardiovascular disease   Diet   Consider PSG   Treat AF   Meds   Antiplatelet or anticoagulant for ischemic stroke. Risk, benefit, toxicity was discussed.    Statins reduce the risk of risk. Risk, benefit, toxicity discussed.   Controlling your blood pressure is important   Signs and symptoms of stroke   Call 911 for stroke like symptoms    Since this patient has had a well documented stroke or cardiovascular risk factors it is paramount that continuing secondary stroke prevention measures be applied and reinforced regularly.     EPWORTH SLEEPINESS SCALE  How Likely Are You to Fall Asleep    Sitting and reading?      1- Slight chance of dozing or sleeping  Watching TV?       1- Slight chance of dozing or sleeping  Sitting inactive in a Public Space?    1- Slight chance of dozing or sleeping  Being a passenger in a car for an hour or more?  1- Slight chance of dozing or sleeping  Sitting quietly after lunch (no alcohol)?   1- Slight chance of dozing or sleeping  Stopped for a few minutes in traffic while driving?  1- Slight chance of dozing or sleeping  Total Score       6   Score 0-9 = Not sleepy   Score 10-17  = Moderate sleepy   Score 18+  = Very sleepy    Assessment:     78 y/o M with hx HTN, HLD, borderline DM, glaucoma, prior CVA, presents to Movement Disorders clinic for f/u of Parkinson's Disease. By history, exam as above, positive  DATscan reported, this does fit with a diagnosis of Parkinson's Disease, but there is significant overlay from his prior posterior circulation stroke. It is unclear what symptoms are CVA related and which are PD related.      Now, walking with walker for short visits with assistance. Right foot is hesitant and bouncing when he is starting to walk.Overall dragging both legs R>L. He has some falls.    Needs PT/OT once safe.  Will refer.  His gait is very affected as is his balance, and bathing is a challenge.       Also discussed the impact of his prior stroke and secondary stroke prevention, with screening completed as above.     Sleeps better at daughter's.     Plan:     1) Parkinson's Disease -   - Continue Rytary to 3 capsules 145mg  4x daily, 5 hours apart.  - Continue Nourianz 20mg  daily.  - Cannot have MAO-B inhibitors d/t Nuedexta use.     2) Gait disorder - multifactorial; PD, comorbid medical issues, prior CVA.  A severe, chronic progressing condition. Fall risk   - Restart PT/OT/SLT when safe and continue exercises - [facility]  - Referred to home health for additional hours  - Discussed fall risk at length- I did express concerns with regards to him going back home to wife in [place] that has health issues herself    3) Dysarthria -   - Continue SLT exercises - HH speech therapy ordered  4) Prior CVA - continue Plavix and statin for secondary stroke prevention.  - screening as above.     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     Reviewed above assessment and plan with Dr. Roque Cash via phone.    RTC in 1 mo. Patient can follow up sooner if needed. In the meantime, patient will contact the office with any questions or concerns.     -----------------------------------------------------------    Dr. Berna Bue available in a supervisory capacity.     Time spent: 30  minutes    Christie Beckers, NP

## 2019-09-08 ENCOUNTER — Ambulatory Visit (INDEPENDENT_AMBULATORY_CARE_PROVIDER_SITE_OTHER): Payer: Medicare HMO | Admitting: *Deleted

## 2019-09-08 DIAGNOSIS — I639 Cerebral infarction, unspecified: Secondary | ICD-10-CM | POA: Diagnosis not present

## 2019-09-08 LAB — CUP PACEART REMOTE DEVICE CHECK
Date Time Interrogation Session: 20201012173318
Implantable Pulse Generator Implant Date: 20170922

## 2019-09-15 ENCOUNTER — Telehealth: Payer: Self-pay

## 2019-09-15 NOTE — Telephone Encounter (Signed)
Pt wife called stating the pt is in the hospital and wanting to know the pt battery life on his ILR. I told her the pt battery status is still okay. She asked me did I know he had his loop for 3 years already. I told her I do see it was put in 08-13-2016 and the battery still say okay. It does not tell me how long it has it just say okay. I told her the last update the monitor sent was on 09-14-2019. She asked me how would we know the battery reach RRT. I told her as long as he sleep by his monitor it will send [place] an Alert stating he reach that RRT and we will give her a phone call. The pt wife verbalized understanding and states she will call back when she has anymore questions.

## 2019-09-17 NOTE — Progress Notes (Signed)
Carelink Summary Report / Loop Recorder 

## 2019-09-29 ENCOUNTER — Encounter (INDEPENDENT_AMBULATORY_CARE_PROVIDER_SITE_OTHER): Payer: Self-pay | Admitting: Neurology

## 2019-09-29 ENCOUNTER — Telehealth (INDEPENDENT_AMBULATORY_CARE_PROVIDER_SITE_OTHER): Payer: Medicare (Managed Care) | Admitting: Neurology

## 2019-09-29 ENCOUNTER — Telehealth (INDEPENDENT_AMBULATORY_CARE_PROVIDER_SITE_OTHER): Payer: Self-pay

## 2019-09-29 VITALS — Ht 67.0 in | Wt 165.0 lb

## 2019-09-29 DIAGNOSIS — R471 Dysarthria and anarthria: Secondary | ICD-10-CM

## 2019-09-29 DIAGNOSIS — G20 Parkinson's disease: Secondary | ICD-10-CM

## 2019-09-29 DIAGNOSIS — F482 Pseudobulbar affect: Secondary | ICD-10-CM

## 2019-09-29 DIAGNOSIS — I639 Cerebral infarction, unspecified: Secondary | ICD-10-CM

## 2019-09-29 DIAGNOSIS — R270 Ataxia, unspecified: Secondary | ICD-10-CM

## 2019-09-29 MED ORDER — ISTRADEFYLLINE 40 MG PO TABS
40.0000 mg | ORAL_TABLET | Freq: Every day | ORAL | 3 refills | Status: DC
Start: 2019-09-29 — End: 2020-02-20

## 2019-09-29 NOTE — Telephone Encounter (Signed)
No PA needed at this time for:  Nourianz 40mg  tablets  Pt may fill w/ [facility]  #90/90 tablets  Co-pay $0.00

## 2019-09-29 NOTE — Patient Instructions (Signed)
[email or web address]    Our plan:     1) Change Nourianz to be 40mg  daily.    2) Continue Rytary at 3 capsules of 145mg  4x daily.      3) Continue Nuedexta 2x daily.    4) Begin physical and occupational therapy.    5) Continue to follow with cardiology as well for syncopal episode.    Keep me updated and we'll meet again in 4-6 weeks.    Today's Visit:      In today's visit I reviewed your medications and records relating your health - prior testing, blood work, reports of other health care providers present in your electronic medical record.     If you have records from non-[facility] doctors please send to [place] or bring to next office visit.     A copy of today's visit will be sent to your referring doctor and/or primary care doctor.    Let me know if there are things we could have done better for your office visit.    Patient satisfaction survey:      If you receive a patient satisfaction survey, I would greatly appreciate it if you would complete it.     We are like a car dealer where we aim for a score of 9 or 10.  If you had an experience that was less than a 9 or 10, please let me know so we can improve. If you had a good experience today, please let us know too.  We value your feedback.     Contact me online:      Patient Portal online - Please sign up for MyChart -- this is the best way to communicate me.  There is a messaging feature which can send messages directly to my inbox.  It is the best way to communicate with me and get test results, medication refills or ask questions.     You can expect to get a response within 24-48 hours during weekdays - if you do not, resend your request and inform us we did not respond. My goal is for every question answered as soon as possible. Average response for a phone call is 3 days due to the volume we receive (which is why MyChart is preferred).    MyChart should not be used if you are having a medical emergency -- call 911 in that case.      Coupons for medication:      If you would like to see if samples or vouchers available for your medicines please consider checking online.  Most medicines have web sites where you can print coupons or vouchers for you co-pay.     For example: AutomobileBuzz.is,  DSLRemote.se, Namendaxr.com, http://www.walker-hill.info/, Rytary.com, Vimpat.com    Thank you for trusting me with your health.      Take care,      Briasia Flinders "Johnston Ebbs, MD  Co-Director, Movement Disorders Specialist  [facility]  [facility]    [email or web address]      [address]  [address]  [place] [address]    [place], [place] [address]  T [phone]   F [phone]   T [phone]   F [phone]

## 2019-09-29 NOTE — Progress Notes (Signed)
Subjective:     ZOOM TELEHEALTH VISIT (secondary to COVID-19 precautionary measures)    Verbal consent has been obtained from the patient to conduct a telehealth visit encounter to minimize exposure to COVID-19: yes    -------------------------------    78 y/o M with hx HTN, HLD, borderline DM, glaucoma, prior CVA, presents to Movement Disorders clinic for f/u of possible Parkinson's Disease.     Since last visit, hasn't gotten PT/OT yet. Walking is still moderately impaired, worsened, declined since last visit. Now has an aid in place.  Last Thursday, he was sitting down and passed out. Was 'checked for stroke' in hospital and was 'cleared'.  Was discharged on Keppra, but hasn't continued since discharge. Loop recorder was negative. Did start a new BP medication a week before, and stopped metoprolol.     Still on Rytary 3 capsules of 145mg  4x daily, 5 hours apart. On Nuedexta now, which has resolved his crying episodes. Tolerating with no issue.     Denies heavy drinking, smoking, and drug use. Counseled to abstain from smoking. No contributory family history.      History retained from initial encounter:     History is mainly from family. He was diagnosed with Parkinson's Disease in 2015, with initial symptoms being changes to his gait.  He was walking slower and his movements were slowed overall.  They report also noting tremors in the legs and hands, unclear laterality. He noticed the tremors throughout the day, but the daughter notices it more with activity, when feeding himself.     The diagnosis of Parkinson's was considered, so he was started on Sinemet, however did not take it because it gave him GI issues. During that year, he had a number of TIAs, but then in 2016 had a completed CVA.  Described a 'clot in the back of his head' by family.  Afterwards, he was having issues with speech and swallowing. Swallow study was normal.     At some point then, the dx of Parkinson's was questioned, but "tests"  were completed that confirmed, though unclear what that was.  Was put back on Sinemet.  Once he is on Sinemet, his walking and tightness improves, but they are concerned about progression as he is falling frequently now. No cognitive changes, just difficulty talking.    Weak spells happen in the morning, he tells family that he's weak. Can get to lie down, but doesn't feel dizzy, woozy or lightheaded.  He feels that he starts to feel weak after the medicine.     Last complete PT in December last year. He has completed SLT which helped.    + uncontrollable laughing and crying noted, depression/anxiety and fatigue.     DATscan reviewed (08/10/2016):    Abnormal image grade 3: Virtually absent uptake bilaterally affecting both putamen and caudate nuclei.  This pattern of activity can be seen with Parkinson's disease or related syndromes.    No family history of Parkinson's Disease or tremor. No history significant head injury or LOC.  No history of neuroleptic or chemical exposure.     Prior history reviewed:    Brian Escobar was seen today in the movement disorders clinic for neurologic consultation at the request of [name], MD. The consultation is for the evaluation of PD. No records are available to me from his prior neurologist. Therefore, all history was obtained from the patient and wife. He was previously being seen in [place], [place]. He was dx with with PD about  3 years ago and wife states that his first sx was was slow handwriting and slow to eat and slight jaw tremor. He was started on carbidopa/levodopa 10/100 tid and pt doesn't think that it helped. It was taking it at 8am/1pm/8-9pm. Lunchtime is at noon. He takes the last at bedtime. However, he admits that he has not taken it for months.    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 When she got to him, he was just laying on the floor. They called 911. He was not on ASA at the time. He was put on plavix  and lipitor. Speech has been biggest issue since the stroke. States that he never went through speech therapy, even when he was in rehab. He did physical therapy only. R leg remains a bit weak after stroke.    07/18/16 update: The patient is seen today in follow-up. I have reviewed many records from several hospitalizations and also his prior neurologist records since our last visit. He was first seen by his prior neurologist in [place] on 06/15/2014 with complaints of speech changes and dragging his right leg and trouble buttoning his clothing. On examination, she reports no bradykinesia at that visit, but she reports that he had lip tremor. He was dragging his right leg and his rapid alternating movements were normal. She did an MRI of the brain just following that visit and this was done on 07/06/2014. There was a small acute right basal ganglia infarct. He followed up with her just after that in August, 2015 and was started on levodopa, 10/100 3 times per day, as she felt that his infarct was acute, whereas his symptoms were for 6 months and he had right leg symptoms and right sided infarct. 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 In September, 2015 the notes said that the patient noted significant improvement on aspirin and a statin. He was then hospitalized in May, 2016 and she followed up with him after that and said that there was just mild slurring of speech. On 07/19/2015 she did have him wearing an event monitor for one month that was negative. It does appear that he had been off of levodopa ever since his infarct in May, 2016. I was able to get his hospital records from [facility] from his infarction. He was admitted on 04/08/2015, but they reported left-sided weakness and dizziness. He had an echocardiogram on 04/09/2015 with an ejection fraction of 60-65%. An EEG was normal. An MRA of the neck was normal. An MRI of the brain stated that DWI appears to demonstrate multiple punctate  areas of high signal involving the cerebellar hemispheres and cerebellar vermis. I did have the opportunity to review these images once they became available, and DWI was in fact positive in both cerebellar hemispheres and amongst the vermis. His carotid ultrasound was negative. There were minimal notes regarding his speech but nothing stated that it was particularly slurred. He was then recently hospitalized once again more on 07/10/2016 and I again reviewed those records. He was hospitalized for inability to swallow. Pt states that swallowing issues are with liquids and not solids because he was trying to take too much in the mouth at once. An MRI without gadolinium was done. I did not get those images. There was reported to be a few old cerebellar infarctions. There is reported to be an old infarction in the left frontal region. DWI was  negative. It was concluded that his swallowing issues were from Parkinsons disease and his refusal to take levodopa. He did have a barium swallow that was reported to show mild oropharyngeal dysphagia, but no dietary changes were made, with the exceptions of to take small bites and administer meds one at a time. He ended up having a very detailed EMG at our office following these hospitalizations to rule out motor neuron disease and there was no evidence of this.    09/08/16 update: Pt returns accompanied by his wife who supplements the history. Since our last visit the patient had a DaT scan in 07/2016 and it was abnormal demonstrating decreased uptake, being virtually absent in the bilateral caudate and the putamen. Wife does state that when he was last hospitalized, he had been given levodopa and it helped but was d/c in the hospital. He also saw Dr. Johney Frame and had a TEE on 08/18/16 and that was normal with the exception of evidence of a PFO. He had a LE doppler on 08/18/16 and that was normal. He had a loop recorder implanted on 08/18/16 and as of 08/28/16 there were no  episodes of atrial fibrillation.     The following portions of the patient's history were reviewed and updated as appropriate: allergies, current medications, past medical history, past social history, past surgical history and problem list.    Review of Systems  + vocal changes, gait disorder.  No recent illness. Denies fever, chills, cough, sinus pain, eye pain, eye redness, ear pain, rhinorrhea, sore throat, chest pain, SOB, wheezing, abd pain, Nausea, Vomiting, diarrhea, constipation, dysuria, or rashes.  All other systems reviewed and are negative except as previously noted in the HPI.    Objective:     Height 50ft7, Weight 160lb  Constitutional: NAD   Eyes: Clear conjunctiva  Musculoskeletal: stooped posture, normal muscle bulk.  Integumentary: No abnormal rash noted  Psych: Flat affect, decreased blink rate. No more emotionality noted.     Neurological:   Mental Status: Alert & oriented to person, place, month, & year.   Language: Dysarthric. Difficult to understand.      Cranial Nerves:  II, III: PERRL  III, IV, VI: Mild limitation to vertical gaze, No nystagmus, no palsies, no ptosis  V: Intact to LT V1-V3 distribution bilaterally.   VI: Symmetrical face and expression.   VIII: Hearing intact to finger rub bilaterally.   IX, X: Palate/Uvula elevates symmetrically.   XI: Trapezius & SCM elevates bilaterally.   XII: Tongue is midline.     Motor Exam: No motor deficits identified.      Sensation: Sensation reported intact to LT grossly through symmetrically. Romberg negative.    Cerebellar:   Finger to Nose: intact bilaterally with mild terminal dysmetria on FTN.    Reflexes: Deferred due to ZOOM telehealth visit.     Station/ Gait: Wider based, short stepped.  Shuffle.  Absent arm swing bilaterally, arms out.  Stooped. No freezing noted.     Fine motor use of both hands is slowed to RAM, finger tapping and open/close, R>L but more fluid with no freezing.  Foot tapping also reduced in this laterality.     The  following was discussed with patient:   Exercise -- even 20 minutes 3x per week makes a difference   Alcohol   Tobacco. Smoking is a major risk factor for cardiovascular disease   Diet   Consider PSG   Treat AF   Meds   Antiplatelet or anticoagulant  for ischemic stroke. Risk, benefit, toxicity was discussed.    Statins reduce the risk of risk. Risk, benefit, toxicity discussed.   Controlling your blood pressure is important   Signs and symptoms of stroke   Call 911 for stroke like symptoms    Since this patient has had a well documented stroke or cardiovascular risk factors it is paramount that continuing secondary stroke prevention measures be applied and reinforced regularly.     EPWORTH SLEEPINESS SCALE  How Likely Are You to Fall Asleep    Sitting and reading?      1- Slight chance of dozing or sleeping  Watching TV?       1- Slight chance of dozing or sleeping  Sitting inactive in a Public Space?    1- Slight chance of dozing or sleeping  Being a passenger in a car for an hour or more?  1- Slight chance of dozing or sleeping  Sitting quietly after lunch (no alcohol)?   1- Slight chance of dozing or sleeping  Stopped for a few minutes in traffic while driving?  1- Slight chance of dozing or sleeping  Total Score       6   Score 0-9 = Not sleepy   Score 10-17  = Moderate sleepy   Score 18+  = Very sleepy    Assessment:     78 y/o M with hx HTN, HLD, borderline DM, glaucoma, prior CVA, presents to Movement Disorders clinic for f/u of Parkinson's Disease. 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 It is unclear what symptoms are CVA related and which are PD related.      Now, freezing is improved on Nourianz but unmasking the imbalance from his prior strokes.  Worsening.  Needs PT/OT, and will try increasing Nourianz to 40mg  daily to help.     We also discussed  the need to follow with cardiology as he is doing, especially considering his syncopal event. Also discussed the impact of his prior stroke and secondary stroke prevention, with screening completed as above.     Sleep is an issue still, with day/night reversal. Suggested melatonin nightly and if not better, can add a low dose of clonazepam.     Plan:     1) Parkinson's Disease - severe, chronic progressing condition.   - Continue Rytary to 3 capsules 145mg  4x daily, 5 hours apart.  - Change Nourianz to 40mg  daily.  - Cannot have MAO-B inhibitors d/t Nuedexta use.     2) Gait disorder - multifactorial; PD, comorbid medical issues, prior CVA.  A severe, chronic progressing condition. Fall risk   - Restart PT/OT/SLT; HOME rehab services of medical necessity d/t inability to travel safely, homebound status.   - Referred to home health for services when safe.  - Discussed fall risk at length.     3) Dysarthria -   - Continue SLT exercises.     4) Prior CVA - continue Plavix and statin for secondary stroke prevention.  - screening as above.     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     RTC in 1-2 mo. Patient can follow up sooner if needed. In the meantime, patient will contact the office with any questions  or concerns.     -----------------------------------------------------------    Elenore Paddy, MD  Co-Director, Movement Disorders Specialist  [facility]  [facility]    [address]  [address]  [place] [address]    [place], [place] [address]  T [phone]   F [phone]   T [phone]   F [phone]     [email or web address]

## 2019-09-30 ENCOUNTER — Encounter (INDEPENDENT_AMBULATORY_CARE_PROVIDER_SITE_OTHER): Payer: Self-pay

## 2019-10-01 ENCOUNTER — Encounter (INDEPENDENT_AMBULATORY_CARE_PROVIDER_SITE_OTHER): Payer: Self-pay | Admitting: Neurology

## 2019-10-09 ENCOUNTER — Telehealth: Payer: Self-pay | Admitting: Internal Medicine

## 2019-10-09 NOTE — Telephone Encounter (Signed)
I spoke with the pt wife. I told her to unplug his monitor for 20 seconds then plug it back in to see if that helps. I told her if that do not help to call [facility] tech support for additional help. The pt wife verbalized understanding.

## 2019-10-09 NOTE — Telephone Encounter (Signed)
Patient's wife needs to speak with someone in the device clinic due to some issues the patient is having with his monitor.

## 2019-10-11 LAB — CUP PACEART REMOTE DEVICE CHECK
Date Time Interrogation Session: 20201114194556
Implantable Pulse Generator Implant Date: 20170922

## 2019-10-13 ENCOUNTER — Ambulatory Visit (INDEPENDENT_AMBULATORY_CARE_PROVIDER_SITE_OTHER): Payer: Medicare HMO | Admitting: *Deleted

## 2019-10-13 DIAGNOSIS — I639 Cerebral infarction, unspecified: Secondary | ICD-10-CM | POA: Diagnosis not present

## 2019-10-22 ENCOUNTER — Telehealth: Payer: Self-pay

## 2019-10-22 NOTE — Telephone Encounter (Signed)
Wife concerned because LINQ implanted over 3 years ago. Explained that battery is still OK as of last transmission 10/11/19 and when battery expires can schedule for explant, expressed concern because patient has had complaint 1 to 2 times of month for sharp pain at implant site. No pain, redness, edema  or drainage at site. Will call if develops signs of infection .

## 2019-10-22 NOTE — Telephone Encounter (Signed)
The pt wife calling wanting some understanding about how long the Linq stays in.

## 2019-11-06 NOTE — Progress Notes (Signed)
Carelink Summary Report / Loop Recorder 

## 2019-11-13 ENCOUNTER — Ambulatory Visit (INDEPENDENT_AMBULATORY_CARE_PROVIDER_SITE_OTHER): Payer: Medicare HMO | Admitting: *Deleted

## 2019-11-13 DIAGNOSIS — I639 Cerebral infarction, unspecified: Secondary | ICD-10-CM | POA: Diagnosis not present

## 2019-11-13 LAB — CUP PACEART REMOTE DEVICE CHECK
Date Time Interrogation Session: 20201216152818
Date Time Interrogation Session: 20201217151936
Implantable Pulse Generator Implant Date: 20170922
Implantable Pulse Generator Implant Date: 20170922

## 2019-11-14 ENCOUNTER — Telehealth (INDEPENDENT_AMBULATORY_CARE_PROVIDER_SITE_OTHER): Payer: Medicare (Managed Care) | Admitting: Neurology

## 2019-11-14 ENCOUNTER — Encounter (INDEPENDENT_AMBULATORY_CARE_PROVIDER_SITE_OTHER): Payer: Self-pay | Admitting: Neurology

## 2019-11-14 VITALS — Ht 67.0 in | Wt 160.0 lb

## 2019-11-14 DIAGNOSIS — R270 Ataxia, unspecified: Secondary | ICD-10-CM

## 2019-11-14 DIAGNOSIS — R2689 Other abnormalities of gait and mobility: Secondary | ICD-10-CM

## 2019-11-14 DIAGNOSIS — G20 Parkinson's disease: Secondary | ICD-10-CM

## 2019-11-14 DIAGNOSIS — R471 Dysarthria and anarthria: Secondary | ICD-10-CM

## 2019-11-14 DIAGNOSIS — F482 Pseudobulbar affect: Secondary | ICD-10-CM

## 2019-11-14 DIAGNOSIS — I639 Cerebral infarction, unspecified: Secondary | ICD-10-CM

## 2019-11-14 NOTE — Progress Notes (Signed)
Subjective:     ZOOM TELEHEALTH VISIT (secondary to COVID-19 precautionary measures)    Verbal consent has been obtained from the patient to conduct a telehealth visit encounter to minimize exposure to COVID-19: yes    -------------------------------    78 y/o M with hx HTN, HLD, borderline DM, glaucoma, prior CVA, presents to Movement Disorders clinic for f/u of possible Parkinson's Disease.     Since last visit, his speech is greatly improved, but fluctuates day to day. Walking was doing better, but then 2 nights ago had another fall in the middle of the night. Still working with rehab who is helping.     No more episodes of LH/D reported. Tried going to [place] 40 but didn't tolerate so back at 20mg .     Still on Rytary 3 capsules of 145mg  4x daily, 5 hours apart. On Nuedexta now, which has resolved his crying episodes. Tolerating with no issue.     Denies heavy drinking, smoking, and drug use. Counseled to abstain from smoking. No contributory family history.      History retained from initial encounter:     History is mainly from family. He was diagnosed with Parkinson's Disease in 2015, with initial symptoms being changes to his gait.  He was walking slower and his movements were slowed overall.  They report also noting tremors in the legs and hands, unclear laterality. He noticed the tremors throughout the day, but the daughter notices it more with activity, when feeding himself.     The diagnosis of Parkinson's was considered, so he was started on Sinemet, however did not take it because it gave him GI issues. During that year, he had a number of TIAs, but then in 2016 had a completed CVA.  Described a 'clot in the back of his head' by family.  Afterwards, he was having issues with speech and swallowing. Swallow study was normal.     At some point then, the dx of Parkinson's was questioned, but "tests" were completed that confirmed, though unclear what that was.  Was put back on Sinemet.  Once he is  on Sinemet, his walking and tightness improves, but they are concerned about progression as he is falling frequently now. No cognitive changes, just difficulty talking.    Weak spells happen in the morning, he tells family that he's weak. Can get to lie down, but doesn't feel dizzy, woozy or lightheaded.  He feels that he starts to feel weak after the medicine.     Last complete PT in December last year. He has completed SLT which helped.    + uncontrollable laughing and crying noted, depression/anxiety and fatigue.     DATscan reviewed (08/10/2016):    Abnormal image grade 3: Virtually absent uptake bilaterally affecting both putamen and caudate nuclei.  This pattern of activity can be seen with Parkinson's disease or related syndromes.    No family history of Parkinson's Disease or tremor. No history significant head injury or LOC.  No history of neuroleptic or chemical exposure.     Prior history reviewed:    Josiel Fryrear was seen today in the movement disorders clinic for neurologic consultation at the request of [name], MD. The consultation is for the evaluation of PD. No records are available to me from his prior neurologist. Therefore, all history was obtained from the patient and wife. He was previously being seen in [place], [place]. He was dx with with PD about 3 years ago and wife states that his  first sx was was slow handwriting and slow to eat and slight jaw tremor. He was started on carbidopa/levodopa 10/100 tid and pt doesn't think that it helped. It was taking it at 8am/1pm/8-9pm. Lunchtime is at noon. He takes the last at bedtime. However, he admits that he has not taken it for months.    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 When she got to him, he was just laying on the floor. They called 911. He was not on ASA at the time. He was put on plavix and lipitor. Speech has been biggest issue since the stroke. States that he never went through  speech therapy, even when he was in rehab. He did physical therapy only. R leg remains a bit weak after stroke.    07/18/16 update: The patient is seen today in follow-up. I have reviewed many records from several hospitalizations and also his prior neurologist records since our last visit. He was first seen by his prior neurologist in [place] on 06/15/2014 with complaints of speech changes and dragging his right leg and trouble buttoning his clothing. On examination, she reports no bradykinesia at that visit, but she reports that he had lip tremor. He was dragging his right leg and his rapid alternating movements were normal. She did an MRI of the brain just following that visit and this was done on 07/06/2014. There was a small acute right basal ganglia infarct. He followed up with her just after that in August, 2015 and was started on levodopa, 10/100 3 times per day, as she felt that his infarct was acute, whereas his symptoms were for 6 months and he had right leg symptoms and right sided infarct. 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 In September, 2015 the notes said that the patient noted significant improvement on aspirin and a statin. He was then hospitalized in May, 2016 and she followed up with him after that and said that there was just mild slurring of speech. On 07/19/2015 she did have him wearing an event monitor for one month that was negative. It does appear that he had been off of levodopa ever since his infarct in May, 2016. I was able to get his hospital records from [facility] from his infarction. He was admitted on 04/08/2015, but they reported left-sided weakness and dizziness. He had an echocardiogram on 04/09/2015 with an ejection fraction of 60-65%. An EEG was normal. An MRA of the neck was normal. An MRI of the brain stated that DWI appears to demonstrate multiple punctate areas of high signal involving the cerebellar hemispheres and cerebellar vermis. I did have  the opportunity to review these images once they became available, and DWI was in fact positive in both cerebellar hemispheres and amongst the vermis. His carotid ultrasound was negative. There were minimal notes regarding his speech but nothing stated that it was particularly slurred. He was then recently hospitalized once again more on 07/10/2016 and I again reviewed those records. He was hospitalized for inability to swallow. Pt states that swallowing issues are with liquids and not solids because he was trying to take too much in the mouth at once. An MRI without gadolinium was done. I did not get those images. There was reported to be a few old cerebellar infarctions. There is reported to be an old infarction in the left frontal region. DWI was negative. It was concluded that his swallowing issues  were from Parkinsons disease and his refusal to take levodopa. He did have a barium swallow that was reported to show mild oropharyngeal dysphagia, but no dietary changes were made, with the exceptions of to take small bites and administer meds one at a time. He ended up having a very detailed EMG at our office following these hospitalizations to rule out motor neuron disease and there was no evidence of this.    09/08/16 update: Pt returns accompanied by his wife who supplements the history. Since our last visit the patient had a DaT scan in 07/2016 and it was abnormal demonstrating decreased uptake, being virtually absent in the bilateral caudate and the putamen. Wife does state that when he was last hospitalized, he had been given levodopa and it helped but was d/c in the hospital. He also saw Dr. Johney Frame and had a TEE on 08/18/16 and that was normal with the exception of evidence of a PFO. He had a LE doppler on 08/18/16 and that was normal. He had a loop recorder implanted on 08/18/16 and as of 08/28/16 there were no episodes of atrial fibrillation.     The following portions of the patient's history were reviewed  and updated as appropriate: allergies, current medications, past medical history, past social history, past surgical history and problem list.    Review of Systems  + vocal changes, gait disorder.  No recent illness. Denies fever, chills, cough, sinus pain, eye pain, eye redness, ear pain, rhinorrhea, sore throat, chest pain, SOB, wheezing, abd pain, Nausea, Vomiting, diarrhea, constipation, dysuria, or rashes.  All other systems reviewed and are negative except as previously noted in the HPI.    Objective:     Height 29ft7, Weight 160lb  Constitutional: NAD   Eyes: Clear conjunctiva  Musculoskeletal: stooped posture, normal muscle bulk.  Integumentary: No abnormal rash noted  Psych: Flat affect, decreased blink rate. No more emotionality noted.     Neurological:   Mental Status: Alert & oriented to person, place, month, & year.   Language: Dysarthric. Difficult to understand.      Cranial Nerves:  II, III: PERRL  III, IV, VI: Mild limitation to vertical gaze, No nystagmus, no palsies, no ptosis  V: Intact to LT V1-V3 distribution bilaterally.   VI: Symmetrical face and expression.   VIII: Hearing intact to finger rub bilaterally.   IX, X: Palate/Uvula elevates symmetrically.   XI: Trapezius & SCM elevates bilaterally.   XII: Tongue is midline.     Motor Exam: No motor deficits identified.      Sensation: Sensation reported intact to LT grossly through symmetrically. Romberg negative.    Cerebellar:   Finger to Nose: intact bilaterally with mild terminal dysmetria on FTN.    Reflexes: Deferred due to ZOOM telehealth visit.     Station/ Gait: Wider based, short stepped.  Shuffle.  Absent arm swing bilaterally, arms out.  Stooped. No freezing noted.     Fine motor use of both hands is slowed to RAM, finger tapping and open/close, R>L but more fluid with no freezing.  Foot tapping also reduced in this laterality.     The following was discussed with patient:   Exercise -- even 20 minutes 3x per week makes a difference    Alcohol   Tobacco. Smoking is a major risk factor for cardiovascular disease   Diet   Consider PSG   Treat AF   Meds   Antiplatelet or anticoagulant for ischemic stroke. Risk, benefit, toxicity was discussed.  Statins reduce the risk of risk. Risk, benefit, toxicity discussed.   Controlling your blood pressure is important   Signs and symptoms of stroke   Call 911 for stroke like symptoms    Since this patient has had a well documented stroke or cardiovascular risk factors it is paramount that continuing secondary stroke prevention measures be applied and reinforced regularly.     EPWORTH SLEEPINESS SCALE  How Likely Are You to Fall Asleep    Sitting and reading?      1- Slight chance of dozing or sleeping  Watching TV?       1- Slight chance of dozing or sleeping  Sitting inactive in a Public Space?    1- Slight chance of dozing or sleeping  Being a passenger in a car for an hour or more?  1- Slight chance of dozing or sleeping  Sitting quietly after lunch (no alcohol)?   1- Slight chance of dozing or sleeping  Stopped for a few minutes in traffic while driving?  1- Slight chance of dozing or sleeping  Total Score       6   Score 0-9 = Not sleepy   Score 10-17  = Moderate sleepy   Score 18+  = Very sleepy    Assessment:     78 y/o M with hx HTN, HLD, borderline DM, glaucoma, prior CVA, presents to Movement Disorders clinic for f/u of Parkinson's Disease. 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 It is unclear what symptoms are CVA related and which are PD related.      Improved here, holding steady. Will continue.  Still dealing with imbalance from his prior strokes.  Needs PT/OT to continue.     We also discussed the need to follow with cardiology as he is doing, especially considering his syncopal event. Also discussed the impact of his prior stroke and  secondary stroke prevention, with screening completed as above.     Sleep is an issue still, with day/night reversal. Suggested melatonin nightly and if not better, can add a low dose of clonazepam.     Plan:     1) Parkinson's Disease -   - Continue Rytary to 3 capsules 145mg  4x daily, 5 hours apart.  - Continue Nourianz at 20mg  daily. Did not tolerate 40mg  daily.   - Cannot have MAO-B inhibitors d/t Nuedexta use.     2) Gait disorder - multifactorial; PD, comorbid medical issues, prior CVA.  A severe, chronic progressing condition. Fall risk   - Continue PT/OT/SLT; HOME rehab services of medical necessity d/t inability to travel safely, homebound status.   - Referred to home health for services when safe.  - Discussed fall risk at length.     3) Dysarthria -   - Continue SLT exercises.     4) Prior CVA - continue Plavix and statin for secondary stroke prevention.  - screening as above.     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     RTC in 1-2 mo in person. Patient can follow up sooner if needed. In the meantime, patient will contact the office with any questions or concerns.     -----------------------------------------------------------    Elenore Paddy, MD  Co-Director,  Movement Disorders Specialist  [facility]  [facility]    [address] [place]  [address]  [place] [address]    [place], [place] [address]  T [phone]   F [phone]   T [phone]   F [phone]     [email or web address]

## 2019-12-16 ENCOUNTER — Ambulatory Visit (INDEPENDENT_AMBULATORY_CARE_PROVIDER_SITE_OTHER): Payer: Medicare HMO | Admitting: *Deleted

## 2019-12-16 DIAGNOSIS — I639 Cerebral infarction, unspecified: Secondary | ICD-10-CM

## 2019-12-17 LAB — CUP PACEART REMOTE DEVICE CHECK
Date Time Interrogation Session: 20210119152943
Implantable Pulse Generator Implant Date: 20170922

## 2020-01-08 ENCOUNTER — Ambulatory Visit (INDEPENDENT_AMBULATORY_CARE_PROVIDER_SITE_OTHER): Payer: 59 | Admitting: Neurology

## 2020-01-14 ENCOUNTER — Other Ambulatory Visit: Payer: Self-pay

## 2020-01-14 ENCOUNTER — Ambulatory Visit (INDEPENDENT_AMBULATORY_CARE_PROVIDER_SITE_OTHER): Payer: Medicare HMO | Admitting: Urology

## 2020-01-14 ENCOUNTER — Encounter: Payer: Self-pay | Admitting: Urology

## 2020-01-14 VITALS — BP 143/88 | HR 85 | Temp 98.1°F | Ht 67.5 in | Wt 160.0 lb

## 2020-01-14 DIAGNOSIS — C678 Malignant neoplasm of overlapping sites of bladder: Secondary | ICD-10-CM

## 2020-01-14 MED ORDER — CIPROFLOXACIN HCL 500 MG PO TABS
500.0000 mg | ORAL_TABLET | Freq: Once | ORAL | Status: AC
  Administered 2020-01-14: 14:00:00 500 mg via ORAL

## 2020-01-14 NOTE — Progress Notes (Signed)
   01/14/20  CC: bladder cancer  HPI: Mr Mike Nixon is a 79yo here for followup for bladder cancer. No hematuria or dysuria.  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 No bladder spasms. NO hematuria. mild dysuria at the end of urination. Family and pt decided on repeat resection of bladder tumor. Myrbetriq added for OAB symptoms.    May 29, 2017: He presents today for worsening urgency and UUI. He has foot on the flood incontinence that has not responded to current medical treatment. He does have some occasional bladder pain and spasms. Denies worsening ability to begin voiding. Denies dysuria or hematuria. He remains afebrile.   05/09/2018: s/p 2 6 weeks courses of BCG. no hematuria or dysuria   08/21/2018: NO hematuria or dysuria. He recently finished antibiotics for a UTI 1 week ago.   09/25/2018: TURBT specimen showed no TCC.   01/08/2019: no recent hematuria and no dysuria. He finsihed 14 days of antibiotic for epididymo-orchitis   04/23/2019: no recent hematuria. no new LUTS. no dysuria   07/23/2019: no hematuria or dysuria. The patient had 2 recent falls on flat ground    Blood pressure (!) 143/88, pulse 85, temperature 98.1 F (36.7 C), height 5' 7.5" (1.715 m), weight 160 lb (72.6 kg). NED. A&Ox3.   No respiratory distress   Abd soft, NT, ND Normal phallus with bilateral descended testicles  Cystoscopy Procedure Note  Patient identification was confirmed, informed consent was obtained, and patient was prepped using Betadine solution.  Lidocaine jelly was administered per urethral meatus.     Pre-Procedure: - Inspection reveals a normal caliber ureteral meatus.  Procedure: The flexible cystoscope was introduced without difficulty - No urethral strictures/lesions are present. - Enlarged prostate bilobar hyperplasia - Elevated bladder neck - Bilateral ureteral orifices identified -  Bladder mucosa  reveals no ulcers, tumors, or lesions - No bladder stones - No trabeculation  Retroflexion shows 1cm intravesical prostatic protrusion   Post-Procedure: - Patient tolerated the procedure well  Assessment/ Plan: Hx of high grade bladder cancer  RTC 6 months for cystoscopy. Patient to start yearly surveillance cystoscopy after next office visit  No follow-ups on file.  Nicolette Bang, MD

## 2020-01-14 NOTE — Progress Notes (Signed)

## 2020-01-14 NOTE — Patient Instructions (Signed)
Bladder Cancer  Bladder cancer is an abnormal growth of tissue in the bladder. The bladder is the balloon-like sac in the pelvis. It collects and stores urine that comes from the kidneys through the ureters. The bladder wall is made of layers. If cancer spreads into these layers and through the wall of the bladder, it becomes more difficult to treat. What are the causes? The cause of this condition is not known. What increases the risk? The following factors may make you more likely to develop this condition:  Smoking.  Workplace risks (occupational exposures), such as rubber, leather, textile, dyes, chemicals, and paint.  Being white.  Your age. Most people with bladder cancer are over the age of 55.  Being male.  Having chronic bladder inflammation.  Having a personal history of bladder cancer.  Having a family history of bladder cancer (heredity).  Having had chemotherapy or radiation therapy to the pelvis.  Having been exposed to arsenic. What are the signs or symptoms? Initial symptoms of this condition include:  Blood in the urine.  Painful urination.  Frequent bladder or urine infections.  Increase in urgency and frequency of urination. Advanced symptoms of this condition include:  Not being able to urinate.  Low back pain on one side.  Loss of appetite.  Weight loss.  Fatigue.  Swelling in the feet.  Bone pain. How is this diagnosed? This condition is diagnosed based on your medical history, a physical exam, urine tests, lab tests, imaging tests, and your symptoms. You may also have other tests or procedures done, such as:  A narrow tube being inserted into your bladder through your urethra (cystoscopy) in order to view the lining of your bladder for tumors.  A biopsy to sample the tumor to see if cancer is present. If cancer is present, it will then be staged to determine its severity and extent. Staging is an assessment of:  The size of the  tumor.  Whether the cancer has spread.  Where the cancer has spread. It is important to know how deeply into the bladder wall cancer has grown and whether cancer has spread to any other parts of your body. Staging may require blood tests or imaging tests, such as a CT scan, MRI, bone scan, or chest X-ray. How is this treated? Based on the stage of cancer, one treatment or a combination of treatments may be recommended. The most common forms of treatment are:  Surgery to remove the cancer. Procedures that may be done include transurethral resection and cystectomy.  Radiation therapy. This is high-energy X-rays or other particles. This is often used in combination with chemotherapy.  Chemotherapy. During this treatment, medicines are used to kill cancer cells.  Immunotherapy. This uses medicines to help your own immune system destroy cancer cells. Follow these instructions at home:  Take over-the-counter and prescription medicines only as told by your health care provider.  Maintain a healthy diet. Some of your treatments might affect your appetite.  Consider joining a support group. This may help you learn to cope with the stress of having bladder cancer.  Tell your cancer care team if you develop side effects. They may be able to recommend ways to relieve them.  Keep all follow-up visits as told by your health care provider. This is important. Where to find more information  American Cancer Society: www.cancer.org  National Cancer Institute (NCI): www.cancer.gov Contact a health care provider if:  You have symptoms of a urinary tract infection. These include: ?   Fever. ? Chills. ? Weakness. ? Muscle aches. ? Abdominal pain. ? Frequent and intense urge to urinate. ? Burning feeling in the bladder or urethra during urination. Get help right away if:  There is blood in your urine.  You cannot urinate.  You have severe pain or other symptoms that do not go  away. Summary  Bladder cancer is an abnormal growth of tissue in the bladder.  This condition is diagnosed based on your medical history, a physical exam, urine tests, lab tests, imaging tests, and your symptoms.  Based on the stage of cancer, surgery, chemotherapy, or a combination of treatments may be recommended.  Consider joining a support group. This may help you learn to cope with the stress of having bladder cancer. This information is not intended to replace advice given to you by your health care provider. Make sure you discuss any questions you have with your health care provider. Document Revised: 10/26/2017 Document Reviewed: 10/17/2016 Elsevier Patient Education  2020 Elsevier Inc.  

## 2020-01-19 ENCOUNTER — Ambulatory Visit (INDEPENDENT_AMBULATORY_CARE_PROVIDER_SITE_OTHER): Payer: Medicare HMO | Admitting: *Deleted

## 2020-01-19 DIAGNOSIS — I639 Cerebral infarction, unspecified: Secondary | ICD-10-CM | POA: Diagnosis not present

## 2020-01-19 LAB — CUP PACEART REMOTE DEVICE CHECK
Date Time Interrogation Session: 20210222003155
Implantable Pulse Generator Implant Date: 20170922

## 2020-01-19 NOTE — Progress Notes (Signed)
ILR Remote 

## 2020-01-26 ENCOUNTER — Encounter (INDEPENDENT_AMBULATORY_CARE_PROVIDER_SITE_OTHER): Payer: Self-pay | Admitting: Neurology

## 2020-01-27 ENCOUNTER — Other Ambulatory Visit (INDEPENDENT_AMBULATORY_CARE_PROVIDER_SITE_OTHER): Payer: Self-pay | Admitting: Neurology

## 2020-01-27 DIAGNOSIS — R471 Dysarthria and anarthria: Secondary | ICD-10-CM

## 2020-01-27 DIAGNOSIS — G20 Parkinson's disease: Secondary | ICD-10-CM

## 2020-01-28 ENCOUNTER — Encounter (INDEPENDENT_AMBULATORY_CARE_PROVIDER_SITE_OTHER): Payer: Self-pay | Admitting: Neurology

## 2020-01-28 ENCOUNTER — Encounter (INDEPENDENT_AMBULATORY_CARE_PROVIDER_SITE_OTHER): Payer: Self-pay

## 2020-01-28 ENCOUNTER — Telehealth (INDEPENDENT_AMBULATORY_CARE_PROVIDER_SITE_OTHER): Payer: 59 | Admitting: Neurology

## 2020-01-28 VITALS — Ht 67.0 in | Wt 165.0 lb

## 2020-01-28 NOTE — Progress Notes (Signed)
Rescheduled to an in person visit given Internet issues on the patient's end.

## 2020-02-19 ENCOUNTER — Ambulatory Visit (INDEPENDENT_AMBULATORY_CARE_PROVIDER_SITE_OTHER): Payer: Medicare HMO | Admitting: *Deleted

## 2020-02-19 ENCOUNTER — Encounter (INDEPENDENT_AMBULATORY_CARE_PROVIDER_SITE_OTHER): Payer: Self-pay | Admitting: Neurology

## 2020-02-19 DIAGNOSIS — I639 Cerebral infarction, unspecified: Secondary | ICD-10-CM | POA: Diagnosis not present

## 2020-02-19 LAB — CUP PACEART REMOTE DEVICE CHECK
Date Time Interrogation Session: 20210325013336
Implantable Pulse Generator Implant Date: 20170922

## 2020-02-19 NOTE — Progress Notes (Signed)
ILR Remote 

## 2020-02-20 ENCOUNTER — Encounter (INDEPENDENT_AMBULATORY_CARE_PROVIDER_SITE_OTHER): Payer: Self-pay | Admitting: Neurology

## 2020-02-20 ENCOUNTER — Ambulatory Visit (INDEPENDENT_AMBULATORY_CARE_PROVIDER_SITE_OTHER): Payer: Medicare (Managed Care) | Admitting: Neurology

## 2020-02-20 VITALS — BP 122/83 | HR 102 | Temp 97.1°F | Ht 67.0 in | Wt 160.0 lb

## 2020-02-20 DIAGNOSIS — I639 Cerebral infarction, unspecified: Secondary | ICD-10-CM

## 2020-02-20 DIAGNOSIS — R270 Ataxia, unspecified: Secondary | ICD-10-CM

## 2020-02-20 DIAGNOSIS — G20 Parkinson's disease: Secondary | ICD-10-CM

## 2020-02-20 DIAGNOSIS — R471 Dysarthria and anarthria: Secondary | ICD-10-CM

## 2020-02-20 DIAGNOSIS — F482 Pseudobulbar affect: Secondary | ICD-10-CM

## 2020-02-20 MED ORDER — ISTRADEFYLLINE 20 MG PO TABS
20.0000 mg | ORAL_TABLET | Freq: Every day | ORAL | 3 refills | Status: DC
Start: 2020-02-20 — End: 2020-02-27

## 2020-02-20 NOTE — Patient Instructions (Addendum)
[email or web address]    Our plan:     1) Begin melatonin 5-6mg  around 10pm every night. Try not to nap during the day.     2) If that doesn't work, we can always add a low dose of clonazepam at 10pm to help get to sleep.     3) Continue PT/OT and speech.     Keep me updated via MyChart and we'll meet back in 3 months.     Today's Visit:      In today's visit I reviewed your medications and records relating your health - prior testing, blood work, reports of other health care providers present in your electronic medical record.     If you have records from non-[facility] doctors please send to [place] or bring to next office visit.     A copy of today's visit will be sent to your referring doctor and/or primary care doctor.    Let me know if there are things we could have done better for your office visit.    Patient satisfaction survey:      If you receive a patient satisfaction survey, I would greatly appreciate it if you would complete it.     We are like a car dealer where we aim for a score of 9 or 10.  If you had an experience that was less than a 9 or 10, please let me know so we can improve. If you had a good experience today, please let us know too.  We value your feedback.     Contact me online:      Patient Portal online - Please sign up for MyChart -- this is the best way to communicate me.  There is a messaging feature which can send messages directly to my inbox.  It is the best way to communicate with me and get test results, medication refills or ask questions.     You can expect to get a response within 24-48 hours during weekdays - if you do not, resend your request and inform us we did not respond. My goal is for every question answered as soon as possible. Average response for a phone call is 3 days due to the volume we receive (which is why MyChart is preferred).    MyChart should not be used if you are having a medical emergency -- call 911 in that case.     Coupons for  medication:      If you would like to see if samples or vouchers available for your medicines please consider checking online.  Most medicines have web sites where you can print coupons or vouchers for you co-pay.     For example: AutomobileBuzz.is,  DSLRemote.se, Namendaxr.com, http://www.walker-hill.info/, Rytary.com, Vimpat.com    Thank you for trusting me with your health.      Take care,      Sheralee Qazi "Johnston Ebbs, MD  Co-Director, Movement Disorders Specialist  [facility]  [facility]    [email or web address]      [address]  [address]  [place] [address]    [place], [place] [address]  T [phone]   F [phone]   T [phone]   F [phone]

## 2020-02-20 NOTE — Progress Notes (Deleted)
Subjective:     ZOOM TELEHEALTH VISIT (secondary to COVID-19 precautionary measures)    Verbal consent has been obtained from the patient to conduct a telehealth visit encounter to minimize exposure to COVID-19: yes    -------------------------------    79 y/o M with hx HTN, HLD, borderline DM, glaucoma, prior CVA, presents to Movement Disorders clinic for f/u of possible Parkinson's Disease.     Since last visit, his speech is greatly improved, but fluctuates day to day. Walking was doing better, but then 2 nights ago had another fall in the middle of the night. Still working with rehab who is helping.     No more episodes of LH/D reported. Tried going to Nourianz 40 but didn't tolerate so back at 20mg.     Still on Rytary 3 capsules of 145mg 4x daily, 5 hours apart. On Nuedexta now, which has resolved his crying episodes. Tolerating with no issue.     Denies heavy drinking, smoking, and drug use. Counseled to abstain from smoking. No contributory family history.      History retained from initial encounter:     History is mainly from family. He was diagnosed with Parkinson's Disease in 2015, with initial symptoms being changes to his gait.  He was walking slower and his movements were slowed overall.  They report also noting tremors in the legs and hands, unclear laterality. He noticed the tremors throughout the day, but the daughter notices it more with activity, when feeding himself.     The diagnosis of Parkinson's was considered, so he was started on Sinemet, however did not take it because it gave him GI issues. During that year, he had a number of TIAs, but then in 2016 had a completed CVA.  Described a 'clot in the back of his head' by family.  Afterwards, he was having issues with speech and swallowing. Swallow study was normal.     At some point then, the dx of Parkinson's was questioned, but "tests" were completed that confirmed, though unclear what that was.  Was put back on Sinemet.  Once he is  on Sinemet, his walking and tightness improves, but they are concerned about progression as he is falling frequently now. No cognitive changes, just difficulty talking.    Weak spells happen in the morning, he tells family that he's weak. Can get to lie down, but doesn't feel dizzy, woozy or lightheaded.  He feels that he starts to feel weak after the medicine.     Last complete PT in December last year. He has completed SLT which helped.    + uncontrollable laughing and crying noted, depression/anxiety and fatigue.      DATscan reviewed (08/10/2016):    Abnormal image grade 3: Virtually absent uptake bilaterally affecting both putamen and caudate nuclei.  This pattern of activity can be seen with Parkinson's disease or related syndromes.    No family history of Parkinson's Disease or tremor. No history significant head injury or LOC.  No history of neuroleptic or chemical exposure.     Prior history reviewed:    Brian Escobar was seen today in the movement disorders clinic for neurologic consultation at the request of SASSER,PAUL W, MD. The consultation is for the evaluation of PD. No records are available to me from his prior neurologist. Therefore, all history was obtained from the patient and wife. He was previously being seen in [place], [place]. He was dx with with PD about 3 years ago and wife states that his   first sx was was slow handwriting and slow to eat and slight jaw tremor. He was started on carbidopa/levodopa 10/100 tid and pt doesn't think that it helped. It was taking it at 8am/1pm/8-9pm. Lunchtime is at noon. He takes the last at bedtime. However, he admits that he has not taken it for months.    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 When she got to him, he was just laying on the floor. They called 911. He was not on ASA at the time. He was put on plavix and lipitor. Speech has been biggest issue since the stroke. States that he never went through  speech therapy, even when he was in rehab. He did physical therapy only. R leg remains a bit weak after stroke.    07/18/16 update: The patient is seen today in follow-up. I have reviewed many records from several hospitalizations and also his prior neurologist records since our last visit. He was first seen by his prior neurologist in [place] on 06/15/2014 with complaints of speech changes and dragging his right leg and trouble buttoning his clothing. On examination, she reports no bradykinesia at that visit, but she reports that he had lip tremor. He was dragging his right leg and his rapid alternating movements were normal. She did an MRI of the brain just following that visit and this was done on 07/06/2014. There was a small acute right basal ganglia infarct. He followed up with her just after that in August, 2015 and was started on levodopa, 10/100 3 times per day, as she felt that his infarct was acute, whereas his symptoms were for 6 months and he had right leg symptoms and right sided infarct. 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 In September, 2015 the notes said that the patient noted significant improvement on aspirin and a statin. He was then hospitalized in May, 2016 and she followed up with him after that and said that there was just mild slurring of speech. On 07/19/2015 she did have him wearing an event monitor for one month that was negative. It does appear that he had been off of levodopa ever since his infarct in May, 2016. I was able to get his hospital records from [facility] from his infarction. He was admitted on 04/08/2015, but they reported left-sided weakness and dizziness. He had an echocardiogram on 04/09/2015 with an ejection fraction of 60-65%. An EEG was normal. An MRA of the neck was normal. An MRI of the brain stated that “DWI appears to demonstrate multiple punctate areas of high signal involving the cerebellar hemispheres and cerebellar vermis.” I did have  the opportunity to review these images once they became available, and DWI was in fact positive in both cerebellar hemispheres and amongst the vermis. His carotid ultrasound was negative. There were minimal notes regarding his speech but nothing stated that it was particularly slurred. He was then recently hospitalized once again more on 07/10/2016 and I again reviewed those records. He was hospitalized for inability to swallow. Pt states that swallowing issues are with liquids and not solids because he was trying to take too much in the mouth at once. An MRI without gadolinium was done. I did not get those images. There was reported to be “a few old cerebellar infarctions.” There is reported to be an old infarction in the left frontal region. DWI was negative. It was concluded that his swallowing issues   were from Parkinson’s disease and his refusal to take levodopa. He did have a barium swallow that was reported to show mild oropharyngeal dysphagia, but no dietary changes were made, with the exceptions of to take small bites and administer meds one at a time. He ended up having a very detailed EMG at our office following these hospitalizations to rule out motor neuron disease and there was no evidence of this.    09/08/16 update: Pt returns accompanied by his wife who supplements the history. Since our last visit the patient had a DaT scan in 07/2016 and it was abnormal demonstrating decreased uptake, being virtually absent in the bilateral caudate and the putamen. Wife does state that when he was last hospitalized, he had been given levodopa and it helped but was d/c in the hospital. He also saw Dr. Allred and had a TEE on 08/18/16 and that was normal with the exception of evidence of a PFO. He had a LE doppler on 08/18/16 and that was normal. He had a loop recorder implanted on 08/18/16 and as of 08/28/16 there were no episodes of atrial fibrillation.     The following portions of the patient's history were reviewed  and updated as appropriate: allergies, current medications, past medical history, past social history, past surgical history and problem list.    Review of Systems  + vocal changes, gait disorder.  No recent illness. Denies fever, chills, cough, sinus pain, eye pain, eye redness, ear pain, rhinorrhea, sore throat, chest pain, SOB, wheezing, abd pain, Nausea, Vomiting, diarrhea, constipation, dysuria, or rashes.  All other systems reviewed and are negative except as previously noted in the HPI.    Objective:     Height 5ft7, Weight 160lb  Constitutional: NAD   Eyes: Clear conjunctiva  Musculoskeletal: stooped posture, normal muscle bulk.  Integumentary: No abnormal rash noted  Psych: Flat affect, decreased blink rate. No more emotionality noted.     Neurological:   Mental Status: Alert & oriented to person, place, month, & year.   Language: Dysarthric. Difficult to understand.      Cranial Nerves:  II, III: PERRL  III, IV, VI: Mild limitation to vertical gaze, No nystagmus, no palsies, no ptosis  V: Intact to LT V1-V3 distribution bilaterally.   VI: Symmetrical face and expression.   VIII: Hearing intact to finger rub bilaterally.   IX, X: Palate/Uvula elevates symmetrically.   XI: Trapezius & SCM elevates bilaterally.   XII: Tongue is midline.     Motor Exam: No motor deficits identified.      Sensation: Sensation reported intact to LT grossly through symmetrically. Romberg negative.    Cerebellar:   Finger to Nose: intact bilaterally with mild terminal dysmetria on FTN.    Reflexes: Deferred due to ZOOM telehealth visit.     Station/ Gait: Wider based, short stepped.  Shuffle.  Absent arm swing bilaterally, arms out.  Stooped. No freezing noted.     Fine motor use of both hands is slowed to RAM, finger tapping and open/close, R>L but more fluid with no freezing.  Foot tapping also reduced in this laterality.     The following was discussed with patient:  · Exercise -- even 20 minutes 3x per week makes a difference   · Alcohol  · Tobacco. Smoking is a major risk factor for cardiovascular disease  · Diet  · Consider PSG  · Treat AF  · Meds  · Antiplatelet or anticoagulant for ischemic stroke. Risk, benefit, toxicity was discussed.   ·   Statins reduce the risk of risk. Risk, benefit, toxicity discussed.  · Controlling your blood pressure is important  · Signs and symptoms of stroke  · Call 911 for stroke like symptoms    Since this patient has had a well documented stroke or cardiovascular risk factors it is paramount that continuing secondary stroke prevention measures be applied and reinforced regularly.     EPWORTH SLEEPINESS SCALE  How Likely Are You to Fall Asleep    Sitting and reading?      1- Slight chance of dozing or sleeping  Watching TV?       1- Slight chance of dozing or sleeping  Sitting inactive in a Public Space?    1- Slight chance of dozing or sleeping  Being a passenger in a car for an hour or more?  1- Slight chance of dozing or sleeping  Sitting quietly after lunch (no alcohol)?   1- Slight chance of dozing or sleeping  Stopped for a few minutes in traffic while driving?  1- Slight chance of dozing or sleeping  Total Score       6   Score 0-9 = Not sleepy   Score 10-17  = Moderate sleepy   Score 18+  = Very sleepy    Assessment:     79 y/o M with hx HTN, HLD, borderline DM, glaucoma, prior CVA, presents to Movement Disorders clinic for f/u of Parkinson's Disease. 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 It is unclear what symptoms are CVA related and which are PD related.      Improved here, holding steady. Will continue.  Still dealing with imbalance from his prior strokes.  Needs PT/OT to continue.     We also discussed the need to follow with cardiology as he is doing, especially considering his syncopal event. Also discussed the impact of his prior stroke and  secondary stroke prevention, with screening completed as above.     Sleep is an issue still, with day/night reversal. Suggested melatonin nightly and if not better, can add a low dose of clonazepam.     Plan:     1) Parkinson's Disease -   - Continue Rytary to 3 capsules 145mg 4x daily, 5 hours apart.  - Continue Nourianz at 20mg daily. Did not tolerate 40mg daily.   - Cannot have MAO-B inhibitors d/t Nuedexta use.     2) Gait disorder - multifactorial; PD, comorbid medical issues, prior CVA.  A severe, chronic progressing condition. Fall risk   - Continue PT/OT/SLT; HOME rehab services of medical necessity d/t inability to travel safely, homebound status.   - Referred to home health for services when safe.  - Discussed fall risk at length.     3) Dysarthria -   - Continue SLT exercises.     4) Prior CVA - continue Plavix and statin for secondary stroke prevention.  - screening as above.     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     RTC in 1-2 mo in person. Patient can follow up sooner if needed. In the meantime, patient will contact the office with any questions or concerns.     -----------------------------------------------------------    Brian Paddock "Drew" Shirlean Berman, MD  Co-Director,   Movement Disorders Specialist  [facility]  [facility]    [address]  [address]  ,[place] [address]    [place], [place] [address]  T [phone]   F [phone]   T [phone]   F [phone]     [email or web address]

## 2020-02-20 NOTE — Progress Notes (Addendum)
Subjective:     79 y/o M with hx HTN, HLD, borderline DM, glaucoma, prior CVA, presents to Movement Disorders clinic for f/u of possible Parkinson's Disease.     Has had speech therapy visit this past Friday, starting back. In PT/OT. Walking is ok, had one fall last week. No more episodes of LH/D reported. Tried going to [place] 40 but didn't tolerate so back at 20mg . Still falling.  Sleeping a lot during the day and up a lot at night, where there is no aid present. Has his days and nights reversed. Has trouble still with turns and walking backwards.     Still on Rytary 3 capsules of 145mg  4x daily, 5 hours apart. On Nuedexta now, which has resolved his crying episodes. Tolerating with no issue.     History retained from initial encounter:     History is mainly from family. He was diagnosed with Parkinson's Disease in 2015, with initial symptoms being changes to his gait.  He was walking slower and his movements were slowed overall.  They report also noting tremors in the legs and hands, unclear laterality. He noticed the tremors throughout the day, but the daughter notices it more with activity, when feeding himself.     The diagnosis of Parkinson's was considered, so he was started on Sinemet, however did not take it because it gave him GI issues. During that year, he had a number of TIAs, but then in 2016 had a completed CVA.  Described a 'clot in the back of his head' by family.  Afterwards, he was having issues with speech and swallowing. Swallow study was normal.     At some point then, the dx of Parkinson's was questioned, but "tests" were completed that confirmed, though unclear what that was.  Was put back on Sinemet.  Once he is on Sinemet, his walking and tightness improves, but they are concerned about progression as he is falling frequently now. No cognitive changes, just difficulty talking.    Weak spells happen in the morning, he tells family that he's weak. Can get to lie down, but doesn't  feel dizzy, woozy or lightheaded.  He feels that he starts to feel weak after the medicine.     Last complete PT in December last year. He has completed SLT which helped.    + uncontrollable laughing and crying noted, depression/anxiety and fatigue.     DATscan reviewed (08/10/2016):    Abnormal image grade 3: Virtually absent uptake bilaterally affecting both putamen and caudate nuclei.  This pattern of activity can be seen with Parkinson's disease or related syndromes.    No family history of Parkinson's Disease or tremor. No history significant head injury or LOC.  No history of neuroleptic or chemical exposure.     Prior history reviewed:    Brian Escobar was seen today in the movement disorders clinic for neurologic consultation at the request of [name], MD. The consultation is for the evaluation of PD. No records are available to me from his prior neurologist. Therefore, all history was obtained from the patient and wife. He was previously being seen in [place], [place]. He was dx with with PD about 3 years ago and wife states that his first sx was was slow handwriting and slow to eat and slight jaw tremor. He was started on carbidopa/levodopa 10/100 tid and pt doesn't think that it helped. It was taking it at 8am/1pm/8-9pm. Lunchtime is at noon. He takes the last at bedtime. However, he  admits that he has not taken it for months.    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 When she got to him, he was just laying on the floor. They called 911. He was not on ASA at the time. He was put on plavix and lipitor. Speech has been biggest issue since the stroke. States that he never went through speech therapy, even when he was in rehab. He did physical therapy only. R leg remains a bit weak after stroke.    07/18/16 update: The patient is seen today in follow-up. I have reviewed many records from several hospitalizations and also his prior neurologist records since  our last visit. He was first seen by his prior neurologist in [place] on 06/15/2014 with complaints of speech changes and dragging his right leg and trouble buttoning his clothing. On examination, she reports no bradykinesia at that visit, but she reports that he had lip tremor. He was dragging his right leg and his rapid alternating movements were normal. She did an MRI of the brain just following that visit and this was done on 07/06/2014. There was a small acute right basal ganglia infarct. He followed up with her just after that in August, 2015 and was started on levodopa, 10/100 3 times per day, as she felt that his infarct was acute, whereas his symptoms were for 6 months and he had right leg symptoms and right sided infarct. 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 In September, 2015 the notes said that the patient noted significant improvement on aspirin and a statin. He was then hospitalized in May, 2016 and she followed up with him after that and said that there was just mild slurring of speech. On 07/19/2015 she did have him wearing an event monitor for one month that was negative. It does appear that he had been off of levodopa ever since his infarct in May, 2016. I was able to get his hospital records from [facility] from his infarction. He was admitted on 04/08/2015, but they reported left-sided weakness and dizziness. He had an echocardiogram on 04/09/2015 with an ejection fraction of 60-65%. An EEG was normal. An MRA of the neck was normal. An MRI of the brain stated that DWI appears to demonstrate multiple punctate areas of high signal involving the cerebellar hemispheres and cerebellar vermis. I did have the opportunity to review these images once they became available, and DWI was in fact positive in both cerebellar hemispheres and amongst the vermis. His carotid ultrasound was negative. There were minimal notes regarding his speech but nothing stated that it was particularly  slurred. He was then recently hospitalized once again more on 07/10/2016 and I again reviewed those records. He was hospitalized for inability to swallow. Pt states that swallowing issues are with liquids and not solids because he was trying to take too much in the mouth at once. An MRI without gadolinium was done. I did not get those images. There was reported to be a few old cerebellar infarctions. There is reported to be an old infarction in the left frontal region. DWI was negative. It was concluded that his swallowing issues were from Parkinsons disease and his refusal to take levodopa. He did have a barium swallow that was reported to show mild oropharyngeal dysphagia, but no dietary changes were made, with the exceptions of to take small bites and administer meds one at a time. He  ended up having a very detailed EMG at our office following these hospitalizations to rule out motor neuron disease and there was no evidence of this.    09/08/16 update: Pt returns accompanied by his wife who supplements the history. Since our last visit the patient had a DaT scan in 07/2016 and it was abnormal demonstrating decreased uptake, being virtually absent in the bilateral caudate and the putamen. Wife does state that when he was last hospitalized, he had been given levodopa and it helped but was d/c in the hospital. He also saw Dr. Johney Frame and had a TEE on 08/18/16 and that was normal with the exception of evidence of a PFO. He had a LE doppler on 08/18/16 and that was normal. He had a loop recorder implanted on 08/18/16 and as of 08/28/16 there were no episodes of atrial fibrillation.     The following portions of the patient's history were reviewed and updated as appropriate: allergies, current medications, past medical history, past social history, past surgical history and problem list.    Review of Systems  + vocal changes, gait disorder.  No recent illness. Denies fever, chills, cough, sinus pain, eye pain, eye  redness, ear pain, rhinorrhea, sore throat, chest pain, SOB, wheezing, abd pain, Nausea, Vomiting, diarrhea, constipation, dysuria, or rashes.  All other systems reviewed and are negative except as previously noted in the HPI.    Objective:     Height 64ft7, Weight 160lb  Constitutional: NAD   Eyes: Clear conjunctiva  Musculoskeletal: stooped posture, normal muscle bulk.  Integumentary: No abnormal rash noted  Psych: Flat affect, decreased blink rate. No more emotionality noted.     Neurological:   Language: Dysarthric. Difficult to understand.  Improved though.     Cranial Nerves:  III, IV, VI: Mild limitation to vertical gaze, No nystagmus, no palsies, no ptosis  V: Intact to LT V1-V3 distribution bilaterally.   VI: Symmetrical face and expression.   VIII: Hearing intact to finger rub bilaterally.     Motor Exam: R hand/UE weakness from prior CVA.      Sensation: Sensation reported intact to LT grossly through symmetrically.     Cerebellar:   Finger to Nose: intact bilaterally with mild terminal dysmetria on FTN.    Reflexes: 1+ symmetric.     Station/ Gait: Wider based, short stepped.  Shuffle.  Absent arm swing bilaterally, arms out.  Stooped. No freezing noted.     Fine motor use of both hands is slowed to RAM, finger tapping and open/close, R>L but more fluid with no freezing.  Foot tapping also reduced in this laterality.     Assessment:     79 y/o M with hx HTN, HLD, borderline DM, glaucoma, prior CVA, presents to Movement Disorders clinic for f/u of Parkinson's Disease. 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 It is unclear what symptoms are CVA related and which are PD related, though RUE issues appear now more CVA in origin as his PD symptoms are responding well here.  Will continue Rytary + Nourianz unchanged.     Needs PT/OT/SLT, which he just started. HOME  rehab services of medical necessity d/t inability to travel safely, homebound status.     Sleep is an issue still, with day/night reversal. Suggested melatonin nightly and if not better, can add a low dose of clonazepam.     Based  on exam and disability, and per the recommendations as well of his occupational therapist, I believe it is medically necessary that Mr. Niemczyk be provided a motorized hospital bed to aid in sleep, transfers, and other ADLs involving the bed.  A standard bed even with side-rails would be inadequate and result in a safety risk.    Plan:     1) Parkinson's Disease -   - Continue Rytary to 3 capsules 145mg  4x daily, 5 hours apart.  - Continue Nourianz at 20mg  daily. Did not tolerate 40mg  daily.   - Cannot have MAO-B inhibitors d/t Nuedexta use.     2) Gait disorder - multifactorial; PD, comorbid medical issues, prior CVA.  A severe, chronic progressing condition. Fall risk   - Continue PT/OT/SLT; HOME rehab services of medical necessity d/t inability to travel safely, homebound status.   - Referred to home health for services when safe.  - Discussed fall risk at length.     3) Dysarthria -   - Continue SLT exercises.     4) Prior CVA - continue Plavix and statin for secondary stroke prevention.  - screening as above.     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     RTC in 3 mo in person. Patient can follow up sooner if needed. In the meantime, patient will contact the office with any questions or concerns.     -----------------------------------------------------------    Approximately 30 minutes were spent on the day of service including face-to-face time with patient, coordinating care, record review and documentation.    Brian Escobar "Johnston Ebbs, MD  Co-Director, Movement Disorders Specialist  [facility]  [facility]    [address] [place]  [address]  [place] [address]    [place], [place] [address]  T [phone]   F [phone]   T [phone]   F [phone]     [email or web address]

## 2020-02-27 ENCOUNTER — Other Ambulatory Visit (INDEPENDENT_AMBULATORY_CARE_PROVIDER_SITE_OTHER): Payer: Self-pay

## 2020-02-27 DIAGNOSIS — G20 Parkinson's disease: Secondary | ICD-10-CM

## 2020-02-27 DIAGNOSIS — R471 Dysarthria and anarthria: Secondary | ICD-10-CM

## 2020-02-27 MED ORDER — ISTRADEFYLLINE 20 MG PO TABS
20.0000 mg | ORAL_TABLET | Freq: Every day | ORAL | 3 refills | Status: DC
Start: 2020-02-27 — End: 2020-06-10

## 2020-03-04 ENCOUNTER — Telehealth: Payer: Self-pay | Admitting: Urology

## 2020-03-04 NOTE — Telephone Encounter (Signed)
Pt requests a nurse return his call. 

## 2020-03-04 NOTE — Telephone Encounter (Signed)
Called pt. No answer. No machine to leave message on

## 2020-03-10 ENCOUNTER — Telehealth: Payer: Self-pay | Admitting: Urology

## 2020-03-10 NOTE — Telephone Encounter (Signed)
Pt requests a nurse return his call. 

## 2020-03-11 NOTE — Telephone Encounter (Signed)
Pt states is he leaking and urine comes out without any ability to control it. Pt asking if you can place him on something to help. Next appt is in august with you.

## 2020-03-22 ENCOUNTER — Telehealth: Payer: Self-pay

## 2020-03-22 LAB — CUP PACEART REMOTE DEVICE CHECK
Date Time Interrogation Session: 20210425013244
Implantable Pulse Generator Implant Date: 20170922

## 2020-03-22 NOTE — Telephone Encounter (Signed)
Wife called today about pt symptoms still persistent with leakage. Spoke with Dr. Alyson Ingles. PVR ordered for Wednesday this week.

## 2020-03-23 ENCOUNTER — Ambulatory Visit (INDEPENDENT_AMBULATORY_CARE_PROVIDER_SITE_OTHER): Payer: Medicare HMO | Admitting: *Deleted

## 2020-03-23 DIAGNOSIS — I639 Cerebral infarction, unspecified: Secondary | ICD-10-CM

## 2020-03-24 ENCOUNTER — Ambulatory Visit (INDEPENDENT_AMBULATORY_CARE_PROVIDER_SITE_OTHER): Payer: Medicare HMO | Admitting: Urology

## 2020-03-24 ENCOUNTER — Encounter: Payer: Self-pay | Admitting: Urology

## 2020-03-24 ENCOUNTER — Other Ambulatory Visit: Payer: Self-pay

## 2020-03-24 VITALS — BP 146/95 | HR 68 | Temp 97.9°F | Ht 71.0 in | Wt 160.0 lb

## 2020-03-24 DIAGNOSIS — C678 Malignant neoplasm of overlapping sites of bladder: Secondary | ICD-10-CM | POA: Diagnosis not present

## 2020-03-24 DIAGNOSIS — N3281 Overactive bladder: Secondary | ICD-10-CM | POA: Diagnosis not present

## 2020-03-24 LAB — POCT URINALYSIS DIPSTICK
Blood, UA: NEGATIVE
Glucose, UA: POSITIVE — AB
Leukocytes, UA: NEGATIVE
Nitrite, UA: NEGATIVE
Protein, UA: NEGATIVE
Spec Grav, UA: >=1.03 — AB (ref 1.010–1.025)
Urobilinogen, UA: NEGATIVE E.U./dL — AB
pH, UA: 5 (ref 5.0–8.0)

## 2020-03-24 LAB — BLADDER SCAN AMB NON-IMAGING: Scan Result: 61.2

## 2020-03-24 MED ORDER — FESOTERODINE FUMARATE ER 8 MG PO TB24
8.0000 mg | ORAL_TABLET | Freq: Every day | ORAL | 0 refills | Status: DC
Start: 2020-03-24 — End: 2020-04-24

## 2020-03-24 NOTE — Progress Notes (Signed)
03/24/2020 3:19 PM   Mike Nixon Mike Nixon 01/05/1941 [id number]  Referring provider: [name], MD [place],  [place] [address]  Urinary incontinence  HPI: [name] is a 79yo here for worsening urge urinary incontinence. He previously tried mirabegron which failed to improve his incontinence. He is on oxybutyin but cannot recall if he is actually taking it.    PMH: Past Medical History:  Diagnosis Date  . Anticoagulant long-term use    PLAVIX  . Benign localized prostatic hyperplasia with lower urinary tract symptoms (LUTS)   . Bladder tumor   . Borderline diabetes    watches diet  . ED (erectile dysfunction) of organic origin   . Gait disturbance, post-stroke    uses wheelchair long distance  . Glaucoma, right eye   . History of bleeding peptic ulcer    03/ 1979  s/p  surgery  . History of concussion    1987 no loc-- no residual  . History of CVA with residual deficit    05/ 2016 CYPTOGENIC CVA --- RESIDUAL GAIT DISTURBANCE AND SIALORRHEA ACCESS (DROOLING)  . Hyperlipidemia   . Hypertension   . Mild atherosclerosis of both carotid arteries    per duplex from outside facitlity 04-08-2015 less then 50% stenosis bilateral ICA  . Parkinson's disease [facility]) neurologist -- dr r. Mitzi Hansen falconer ([facility], [facility])   dx 2015  . PBA (pseudobulbar affect)    laughter-- residual from cva 05/ 2016  . PFO (patent foramen ovale)    per TEE 08-18-2016  . Speech impairment    post-stroke  . Status post placement of implantable loop recorder 08/18/2016   placed by dr allred  . Wears glasses     Surgical History: Past Surgical History:  Procedure Laterality Date  . APPENDECTOMY  1978  . CATARACT EXTRACTION W/ INTRAOCULAR LENS IMPLANT Right 2014 approx.  . CYSTOSCOPY W/ URETERAL STENT PLACEMENT Bilateral 05/07/2017   Procedure: CYSTOSCOPY WITH RETROGRADE PYELOGRAM/URETERAL;  Surgeon: Cleon Gustin, MD;  Location: [facility];   Service: Urology;  Laterality: Bilateral;  . EP IMPLANTABLE DEVICE N/A 08/18/2016   Procedure: Loop Recorder Insertion;  Surgeon: Thompson Grayer, MD;  Location: [facility];  Service: Cardiovascular;  Laterality: N/A;  . REMOVAL NODULES BILATERAL FLANK  2008 approx.   per pt benign fatty tissue  . REPAIR OF PERFORATED ULCER  01/1978   bleeding peptic ulcer  . TEE WITHOUT CARDIOVERSION N/A 08/18/2016   Procedure: TRANSESOPHAGEAL ECHOCARDIOGRAM (TEE);  Surgeon: Sueanne Margarita, MD;  Location[facility];  Service: Cardiovascular;  Laterality: N/A; no evidence thrombus;  mobile atrial septum w/ positive bubble study consistent with PFO/ trivial AR, mild TR  . TRANSTHORACIC ECHOCARDIOGRAM  04/09/2015   ef [id number], grade 1 diastolic dysfunction/  mild AV sclerosis without stenosis/  mild TR,  trivial MR and PR  . TRANSURETHRAL RESECTION OF BLADDER TUMOR N/A 05/07/2017   Procedure: TRANSURETHRAL RESECTION OF BLADDER TUMOR (TURBT);  Surgeon: Cleon Gustin, MD;  Location: [facility];  Service: Urology;  Laterality: N/A;  . TRANSURETHRAL RESECTION OF BLADDER TUMOR N/A 06/18/2017   Procedure: TRANSURETHRAL RESECTION OF BLADDER TUMOR (TURBT);  Surgeon: Cleon Gustin, MD;  Location: [facility];  Service: Urology;  Laterality: N/A;  . TRANSURETHRAL RESECTION OF BLADDER TUMOR N/A 09/11/2018   Procedure: TRANSURETHRAL RESECTION OF BLADDER TUMOR (TURBT);  Surgeon: Cleon Gustin, MD;  Location: [facility];  Service: Urology;  Laterality: N/A;  . TRANSURETHRAL  RESECTION OF PROSTATE  08-20-2013   at [facility] Medications:  Allergies as of 03/24/2020      Reactions   Benazepril Cough   Doxycycline Other (See Comments)   Unknown   Levofloxacin Hives, Swelling   Simvastatin Other (See Comments)   Pt does not remember what the reaction was. ?muscle aches   Tamsulosin Hives, Swelling   Tramadol Hcl Hives, Swelling   Sulfa Antibiotics Hives, Swelling, Rash, Other (See Comments)    Severe rash      Medication List       Accurate as of March 24, 2020  3:19 PM. If you have any questions, ask your nurse or doctor.        amoxicillin 500 MG capsule Commonly known as: AMOXIL Take 500 mg by mouth 3 (three) times daily.   atorvastatin 40 MG tablet Commonly known as: LIPITOR Take 40 mg by mouth every evening.   clopidogrel 75 MG tablet Commonly known as: PLAVIX Take 1 tablet (75 mg total) by mouth daily.   finasteride 5 MG tablet Commonly known as: PROSCAR Take 1 tablet (5 mg total) by mouth daily.   furosemide 20 MG tablet Commonly known as: LASIX Take 1 tablet (20 mg total) by mouth as needed. What changed:   when to take this  reasons to take this   hydrochlorothiazide 25 MG tablet Commonly known as: HYDRODIURIL Take 25 mg by mouth daily.   meclizine 25 MG tablet Commonly known as: ANTIVERT Take 25 mg by mouth 3 (three) times daily as needed for dizziness.   metoprolol succinate 50 MG 24 hr tablet Commonly known as: TOPROL-XL Take 1 tablet (50 mg total) by mouth every evening for 30 days.   Nourianz 40 MG Tabs Generic drug: Istradefylline What changed: Another medication with the same name was removed. Continue taking this medication, and follow the directions you see here. Changed by: Nicolette Bang, MD   Istradefylline 20 MG Tabs Take by mouth. What changed: Another medication with the same name was removed. Continue taking this medication, and follow the directions you see here. Changed by: Nicolette Bang, MD   Nuedexta 20-10 MG capsule Generic drug: Dextromethorphan-quiNIDine Take 1 capsule by mouth 2 (two) times daily.   oxybutynin 5 MG tablet Commonly known as: DITROPAN Take 1 tablet (5 mg total) by mouth every 8 (eight) hours as needed for bladder spasms.   Rytary 36.25-145 MG Cpcr Generic drug: Carbidopa-Levodopa ER Take 3 capsules by mouth 4 (four) times daily.       Allergies:  Allergies  Allergen Reactions  .  Benazepril Cough  . Doxycycline Other (See Comments)    Unknown  . Levofloxacin Hives and Swelling  . Simvastatin Other (See Comments)    Pt does not remember what the reaction was. ?muscle aches  . Tamsulosin Hives and Swelling  . Tramadol Hcl Hives and Swelling  . Sulfa Antibiotics Hives, Swelling, Rash and Other (See Comments)    Severe rash    Family History: Family History  Problem Relation Age of Onset  . Stroke Sister     Social History:  reports that he has never smoked. He has never used smokeless tobacco. He reports that he does not drink alcohol or use drugs.  ROS: All other review of systems were reviewed and are negative except what is noted above in HPI  Physical Exam: BP (!) 146/95   Pulse 68   Temp 97.9 F (36.6 C)   Ht 5\' 11"  (1.803  m)   Wt 160 lb (72.6 kg)   BMI 22.32 kg/m   Constitutional:  Alert and oriented, No acute distress. HEENT: [place] AT, moist mucus membranes.  Trachea midline, no masses. Cardiovascular: No clubbing, cyanosis, or edema. Respiratory: Normal respiratory effort, no increased work of breathing. GI: Abdomen is soft, nontender, nondistended, no abdominal masses GU: No CVA tenderness.  Lymph: No cervical or inguinal lymphadenopathy. Skin: No rashes, bruises or suspicious lesions. Neurologic: Grossly intact, no focal deficits, moving all 4 extremities. Psychiatric: Normal mood and affect.  Laboratory Data: Lab Results  Component Value Date   WBC 6.9 01/15/2019   HGB 12.0 (L) 01/15/2019   HCT 40.2 01/15/2019   MCV 82.2 01/15/2019   PLT 208 01/15/2019    Lab Results  Component Value Date   CREATININE 1.02 01/15/2019    No results found for: PSA  No results found for: TESTOSTERONE  Lab Results  Component Value Date   HGBA1C 6.4 (H) 06/12/2017    Urinalysis    Component Value Date/Time   COLORURINE AMBER (A) 01/12/2019 1235   APPEARANCEUR HAZY (A) 01/12/2019 1235   LABSPEC 1.026 01/12/2019 1235   PHURINE 5.0  01/12/2019 1235   GLUCOSEU NEGATIVE 01/12/2019 1235   HGBUR SMALL (A) 01/12/2019 1235   BILIRUBINUR NEGATIVE 01/12/2019 1235   KETONESUR 5 (A) 01/12/2019 1235   PROTEINUR 30 (A) 01/12/2019 1235   NITRITE NEGATIVE 01/12/2019 1235   LEUKOCYTESUR SMALL (A) 01/12/2019 1235    Lab Results  Component Value Date   BACTERIA RARE (A) 01/12/2019    Pertinent Imaging:  No results found for this or any previous visit. No results found for this or any previous visit. No results found for this or any previous visit. No results found for this or any previous visit. No results found for this or any previous visit. No results found for this or any previous visit. No results found for this or any previous visit. No results found for this or any previous visit.  Assessment & Plan:    1.  OAB (overactive bladder) -toviaz 4mg  daily   No follow-ups on file.  Nicolette Bang, MD  [facility] [place]

## 2020-03-24 NOTE — Patient Instructions (Signed)

## 2020-03-24 NOTE — Progress Notes (Signed)
Urological Symptom Review  Patient is experiencing the following symptoms: Frequent urination Hard to postpone urination   Review of Systems  Gastrointestinal (upper)  : Negative for upper GI symptoms  Gastrointestinal (lower) : Negative for lower GI symptoms  Constitutional : Negative for symptoms  Skin: Negative for skin symptoms  Eyes: Negative for eye symptoms  Ear/Nose/Throat : Negative for Ear/Nose/Throat symptoms  Hematologic/Lymphatic: Negative for Hematologic/Lymphatic symptoms  Cardiovascular : Negative for cardiovascular symptoms  Respiratory : Negative for respiratory symptoms  Endocrine: Negative for endocrine symptoms  Musculoskeletal: Negative for musculoskeletal symptoms  Neurological: Negative for neurological symptoms  Psychologic: Depression

## 2020-03-24 NOTE — Progress Notes (Signed)
ILR Remote 

## 2020-03-26 ENCOUNTER — Telehealth: Payer: Self-pay | Admitting: Urology

## 2020-03-26 NOTE — Telephone Encounter (Signed)
Left vm concerning a few medication questions. Would  Like a call back.

## 2020-03-29 ENCOUNTER — Other Ambulatory Visit: Payer: Self-pay

## 2020-03-29 NOTE — Telephone Encounter (Signed)
Wife had questions about toviaz with pt glaucoma history. I spoke with Dr. Alyson Ingles. Pt was on oxybutynin prior to [place] Ply so he feels pt can take [place]. Wife notified.

## 2020-04-13 ENCOUNTER — Encounter (INDEPENDENT_AMBULATORY_CARE_PROVIDER_SITE_OTHER): Payer: Self-pay | Admitting: Neurology

## 2020-04-19 ENCOUNTER — Telehealth: Payer: Self-pay

## 2020-04-19 NOTE — Telephone Encounter (Signed)
Left message for patient to inform of disconnected monitor. 

## 2020-04-21 ENCOUNTER — Ambulatory Visit (INDEPENDENT_AMBULATORY_CARE_PROVIDER_SITE_OTHER): Payer: Medicare HMO | Admitting: *Deleted

## 2020-04-21 DIAGNOSIS — I639 Cerebral infarction, unspecified: Secondary | ICD-10-CM

## 2020-04-21 LAB — CUP PACEART REMOTE DEVICE CHECK
Date Time Interrogation Session: 20210526014714
Implantable Pulse Generator Implant Date: 20170922

## 2020-04-21 NOTE — Progress Notes (Signed)
Carelink Summary Report / Loop Recorder 

## 2020-04-22 ENCOUNTER — Other Ambulatory Visit: Payer: Self-pay | Admitting: Urology

## 2020-05-09 ENCOUNTER — Emergency Department (HOSPITAL_COMMUNITY): Payer: Medicare HMO

## 2020-05-09 ENCOUNTER — Other Ambulatory Visit: Payer: Self-pay

## 2020-05-09 ENCOUNTER — Encounter (HOSPITAL_COMMUNITY): Payer: Self-pay | Admitting: *Deleted

## 2020-05-09 ENCOUNTER — Emergency Department (HOSPITAL_COMMUNITY)
Admission: EM | Admit: 2020-05-09 | Discharge: 2020-05-09 | Disposition: A | Discharge status: Home / Self Care | Payer: Medicare HMO | Attending: Emergency Medicine | Admitting: Emergency Medicine

## 2020-05-09 DIAGNOSIS — Z7901 Long term (current) use of anticoagulants: Secondary | ICD-10-CM | POA: Insufficient documentation

## 2020-05-09 DIAGNOSIS — I10 Essential (primary) hypertension: Secondary | ICD-10-CM | POA: Insufficient documentation

## 2020-05-09 DIAGNOSIS — M25572 Pain in left ankle and joints of left foot: Secondary | ICD-10-CM | POA: Insufficient documentation

## 2020-05-09 DIAGNOSIS — Y999 Unspecified external cause status: Secondary | ICD-10-CM | POA: Insufficient documentation

## 2020-05-09 DIAGNOSIS — Y929 Unspecified place or not applicable: Secondary | ICD-10-CM | POA: Insufficient documentation

## 2020-05-09 DIAGNOSIS — Y939 Activity, unspecified: Secondary | ICD-10-CM | POA: Insufficient documentation

## 2020-05-09 DIAGNOSIS — W01198A Fall on same level from slipping, tripping and stumbling with subsequent striking against other object, initial encounter: Secondary | ICD-10-CM | POA: Diagnosis not present

## 2020-05-09 DIAGNOSIS — S0990XA Unspecified injury of head, initial encounter: Secondary | ICD-10-CM | POA: Diagnosis present

## 2020-05-09 DIAGNOSIS — Z79899 Other long term (current) drug therapy: Secondary | ICD-10-CM | POA: Insufficient documentation

## 2020-05-09 DIAGNOSIS — W19XXXA Unspecified fall, initial encounter: Secondary | ICD-10-CM

## 2020-05-09 DIAGNOSIS — S0093XA Contusion of unspecified part of head, initial encounter: Secondary | ICD-10-CM | POA: Diagnosis not present

## 2020-05-09 LAB — URINALYSIS, ROUTINE W REFLEX MICROSCOPIC
Bilirubin Urine: NEGATIVE
Glucose, UA: 50 mg/dL — AB
Hgb urine dipstick: NEGATIVE
Ketones, ur: NEGATIVE mg/dL
Leukocytes,Ua: NEGATIVE
Nitrite: NEGATIVE
Protein, ur: NEGATIVE mg/dL
Specific Gravity, Urine: 1.014 (ref 1.005–1.030)
pH: 6 (ref 5.0–8.0)

## 2020-05-09 MED ORDER — HYDROCODONE-ACETAMINOPHEN 5-325 MG PO TABS
2.0000 | ORAL_TABLET | Freq: Once | ORAL | Status: AC
  Administered 2020-05-09: 2 via ORAL
  Filled 2020-05-09: qty 2

## 2020-05-09 MED ORDER — HYDROCODONE-ACETAMINOPHEN 5-325 MG PO TABS: 1.0000 | ORAL_TABLET | ORAL | 0 refills | Status: DC | PRN

## 2020-05-09 NOTE — Discharge Instructions (Signed)
Return if any problems.  See your Physician for recheck this week  

## 2020-05-09 NOTE — ED Provider Notes (Signed)
[facility] EMERGENCY DEPARTMENT Provider Note   CSN: [id number] Arrival date & time: 05/09/20  1652     History Chief Complaint  Patient presents with  . Fall    Mike Nixon is a 79 y.o. male.  The history is provided by the patient. No language interpreter was used.  Fall This is a new problem. The current episode started 3 to 5 hours ago. The problem occurs constantly. The problem has not changed since onset.Associated symptoms include headaches. Nothing aggravates the symptoms. Nothing relieves the symptoms. He has tried nothing for the symptoms. The treatment provided moderate relief.  Pt reports he fell and hit his head.  Pt is on plavix.  Pt complains of pain in his head, left chest, left side and left ankle.  Pt reports ankle is swollen      Past Medical History:  Diagnosis Date  . Anticoagulant long-term use    PLAVIX  . Benign localized prostatic hyperplasia with lower urinary tract symptoms (LUTS)   . Bladder tumor   . Borderline diabetes    watches diet  . ED (erectile dysfunction) of organic origin   . Gait disturbance, post-stroke    uses wheelchair long distance  . Glaucoma, right eye   . History of bleeding peptic ulcer    03/ 1979  s/p  surgery  . History of concussion    1987 no loc-- no residual  . History of CVA with residual deficit    05/ 2016 CYPTOGENIC CVA --- RESIDUAL GAIT DISTURBANCE AND SIALORRHEA ACCESS (DROOLING)  . Hyperlipidemia   . Hypertension   . Mild atherosclerosis of both carotid arteries    per duplex from outside facitlity 04-08-2015 less then 50% stenosis bilateral ICA  . Parkinson's disease [facility]) neurologist -- dr r. Mitzi Hansen falconer ([facility], [facility])   dx 2015  . PBA (pseudobulbar affect)    laughter-- residual from cva 05/ 2016  . PFO (patent foramen ovale)    per TEE 08-18-2016  . Speech impairment    post-stroke  . Status post placement of implantable loop recorder 08/18/2016   placed by  dr allred  . Wears glasses     Patient Active Problem List   Diagnosis Date Noted  . OAB (overactive bladder) 03/24/2020  . Malignant neoplasm of overlapping sites of bladder ([place]) 01/14/2020  . BPH (benign prostatic hyperplasia) 01/13/2019  . Acute lower UTI 01/13/2019  . Sepsis ([place]) 01/12/2019  . Chest pain in adult 10/11/2018  . Hematuria 10/11/2018  . Community acquired pneumonia 10/11/2018  . CVA (cerebral vascular accident) ([place]) 10/11/2018  . Parkinson's disease ([place]) 10/11/2018  . Benign essential HTN 10/11/2018  . HLD (hyperlipidemia) 10/11/2018  . Bladder tumor 05/07/2017    Past Surgical History:  Procedure Laterality Date  . APPENDECTOMY  1978  . CATARACT EXTRACTION W/ INTRAOCULAR LENS IMPLANT Right 2014 approx.  . CYSTOSCOPY W/ URETERAL STENT PLACEMENT Bilateral 05/07/2017   Procedure: CYSTOSCOPY WITH RETROGRADE PYELOGRAM/URETERAL;  Surgeon: Cleon Gustin, MD;  Location: [facility];  Service: Urology;  Laterality: Bilateral;  . EP IMPLANTABLE DEVICE N/A 08/18/2016   Procedure: Loop Recorder Insertion;  Surgeon: Thompson Grayer, MD;  Location: [place] [facility];  Service: Cardiovascular;  Laterality: N/A;  . REMOVAL NODULES BILATERAL FLANK  2008 approx.   per pt benign fatty tissue  . REPAIR OF PERFORATED ULCER  01/1978   bleeding peptic ulcer  . TEE WITHOUT CARDIOVERSION N/A 08/18/2016   Procedure: TRANSESOPHAGEAL ECHOCARDIOGRAM (TEE);  Surgeon: Sueanne Margarita, MD;  Location: [facility] ENDOSCOPY;  Service: Cardiovascular;  Laterality: N/A; no evidence thrombus;  mobile atrial septum w/ positive bubble study consistent with PFO/ trivial AR, mild TR  . TRANSTHORACIC ECHOCARDIOGRAM  04/09/2015   ef 16-07%, grade 1 diastolic dysfunction/  mild AV sclerosis without stenosis/  mild TR,  trivial MR and PR  . TRANSURETHRAL RESECTION OF BLADDER TUMOR N/A 05/07/2017   Procedure: TRANSURETHRAL RESECTION OF BLADDER TUMOR (TURBT);  Surgeon: Cleon Gustin, MD;   Location: [facility];  Service: Urology;  Laterality: N/A;  . TRANSURETHRAL RESECTION OF BLADDER TUMOR N/A 06/18/2017   Procedure: TRANSURETHRAL RESECTION OF BLADDER TUMOR (TURBT);  Surgeon: Cleon Gustin, MD;  Location: [facility];  Service: Urology;  Laterality: N/A;  . TRANSURETHRAL RESECTION OF BLADDER TUMOR N/A 09/11/2018   Procedure: TRANSURETHRAL RESECTION OF BLADDER TUMOR (TURBT);  Surgeon: Cleon Gustin, MD;  Location: [facility];  Service: Urology;  Laterality: N/A;  . TRANSURETHRAL RESECTION OF PROSTATE  08-20-2013   at [facility] History  Problem Relation Age of Onset  . Stroke Sister     Social History   Tobacco Use  . Smoking status: Never Smoker  . Smokeless tobacco: Never Used  Vaping Use  . Vaping Use: Never used  Substance Use Topics  . Alcohol use: No  . Drug use: No    Home Medications Prior to Admission medications   Medication Sig Start Date End Date Taking? Authorizing Provider  amoxicillin (AMOXIL) 500 MG capsule Take 500 mg by mouth 3 (three) times daily. 03/23/20   [provider]  atorvastatin (LIPITOR) 40 MG tablet Take 40 mg by mouth every evening.    [provider]  Carbidopa-Levodopa ER (RYTARY) 36.25-145 MG CPCR Take 3 capsules by mouth 4 (four) times daily.     [provider]  clopidogrel (PLAVIX) 75 MG tablet Take 1 tablet (75 mg total) by mouth daily. 01/16/19   Johnson, Clanford L, MD  finasteride (PROSCAR) 5 MG tablet Take 1 tablet (5 mg total) by mouth daily. 01/16/19   Johnson, Clanford L, MD  furosemide (LASIX) 20 MG tablet Take 1 tablet (20 mg total) by mouth as needed. Patient taking differently: Take 20 mg by mouth daily as needed for fluid.  08/20/18 01/12/19  Arnoldo Lenis, MD  hydrochlorothiazide (HYDRODIURIL) 25 MG tablet Take 25 mg by mouth daily. 01/12/20   [provider]  Istradefylline 20 MG TABS Take by mouth. 02/27/20   [provider]  meclizine  (ANTIVERT) 25 MG tablet Take 25 mg by mouth 3 (three) times daily as needed for dizziness.     [provider]  metoprolol succinate (TOPROL-XL) 50 MG 24 hr tablet Take 1 tablet (50 mg total) by mouth every evening for 30 days. 01/16/19 02/15/19  Murlean Iba, MD  NOURIANZ 40 MG TABS  12/27/19   [provider]  NUEDEXTA 20-10 MG CAPS Take 1 capsule by mouth 2 (two) times daily. 06/02/18   [provider]  TOVIAZ 8 MG TB24 tablet TAKE 1 TABLET BY MOUTH EVERY DAY 04/24/20   McKenzie, Candee Furbish, MD    Allergies    Benazepril, Doxycycline, Levofloxacin, Simvastatin, Tamsulosin, Tramadol hcl, and Sulfa antibiotics  Review of Systems   Review of Systems  Musculoskeletal: Positive for back pain and joint swelling.  Neurological: Positive for headaches.  All other systems reviewed and are negative.   Physical Exam Updated Vital Signs  BP 126/83 (BP Location: Right Arm)   Pulse 75   Temp 98.6 F (37 C) (Oral)   Resp 15   Ht 5\' 10"  (1.778 m)   Wt 74.8 kg   SpO2 100%   BMI 23.68 kg/m   Physical Exam Vitals and nursing note reviewed.  Constitutional:      Appearance: Normal appearance. He is well-developed.  HENT:     Head: Normocephalic and atraumatic.     Mouth/Throat:     Mouth: Mucous membranes are moist.  Eyes:     Conjunctiva/sclera: Conjunctivae normal.  Cardiovascular:     Rate and Rhythm: Normal rate and regular rhythm.     Heart sounds: No murmur heard.   Pulmonary:     Effort: Pulmonary effort is normal. No respiratory distress.     Breath sounds: Normal breath sounds.  Abdominal:     Palpations: Abdomen is soft.     Tenderness: There is no abdominal tenderness.  Musculoskeletal:     Cervical back: Normal range of motion and neck supple.  Skin:    General: Skin is warm and dry.  Neurological:     General: No focal deficit present.     Mental Status: He is alert.  Psychiatric:        Mood and Affect: Mood normal.     ED Results /  Procedures / Treatments   Labs (all labs ordered are listed, but only abnormal results are displayed) Labs Reviewed - No data to display  EKG None  Radiology DG Chest 2 View  Result Date: 05/09/2020 CLINICAL DATA:  Pain status post fall. EXAM: CHEST - 2 VIEW COMPARISON:  01/12/2019 FINDINGS: There is a loop recorder in place. The heart size is stable. There is no pneumothorax. No large pleural effusion. No acute osseous abnormality. IMPRESSION: No active cardiopulmonary disease. Electronically Signed   By: Constance Holster M.D.   On: 05/09/2020 19:06   DG Pelvis 1-2 Views  Result Date: 05/09/2020 CLINICAL DATA:  Pain status post fall EXAM: PELVIS - 1-2 VIEW COMPARISON:  None. FINDINGS: There is no evidence of pelvic fracture or diastasis. No pelvic bone lesions are seen. IMPRESSION: Negative. Electronically Signed   By: Constance Holster M.D.   On: 05/09/2020 19:07   DG Ankle Complete Left  Result Date: 05/09/2020 CLINICAL DATA:  Pain EXAM: LEFT ANKLE COMPLETE - 3+ VIEW COMPARISON:  None. FINDINGS: There is extensive soft tissue swelling about the ankle. There is no evidence for an acute displaced fracture or dislocation. Vascular calcifications are noted. IMPRESSION: Extensive soft tissue swelling about the ankle without evidence for an acute displaced fracture or dislocation. Electronically Signed   By: Constance Holster M.D.   On: 05/09/2020 19:08   CT Head Wo Contrast  Result Date: 05/09/2020 CLINICAL DATA:  Fall from standing after losing balance, fall from a chair. EXAM: CT HEAD WITHOUT CONTRAST TECHNIQUE: Contiguous axial images were obtained from the base of the skull through the vertex without intravenous contrast. COMPARISON:  Due [id number] FINDINGS: Brain: No evidence of acute infarction, hemorrhage, hydrocephalus, extra-axial collection or mass lesion/mass effect. Signs of atrophy and chronic microvascular ischemic changes. Vascular: No hyperdense vessel or unexpected  calcification. Skull: Normal. Negative for fracture or focal lesion. Sinuses/Orbits: No acute finding. Paranasal sinuses are incompletely imaged. Other: None. IMPRESSION: 1. No acute intracranial pathology. 2. Signs of atrophy and chronic microvascular ischemic changes. Electronically Signed   By: Zetta Bills M.D.   On: 05/09/2020 19:18    Procedures Procedures (  including critical care time)  Medications Ordered in ED Medications  HYDROcodone-acetaminophen (NORCO/VICODIN) 5-325 MG per tablet 2 tablet (has no administration in time range)    ED Course  I have reviewed the triage vital signs and the nursing notes.  Pertinent labs & imaging results that were available during my care of the patient were reviewed by me and considered in my medical decision making (see chart for details).    MDM Rules/Calculators/A&P                          MDM: Ct head  No acute abnormality,  Chest xray, pelvis, left hip and left ankle  No fracture.  Pt given 2 hydrocodone for headache and pain.  Pt advised to continue home medications Final Clinical Impression(s) / ED Diagnoses Final diagnoses:  Fall, initial encounter  Contusion of head, unspecified part of head, initial encounter  Acute left ankle pain    Rx / DC Orders ED Discharge Orders         Ordered    HYDROcodone-acetaminophen (NORCO/VICODIN) 5-325 MG tablet  Every 4 hours PRN     Discontinue  Reprint     05/09/20 2108        An After Visit Summary was printed and given to the patient.   Sidney Ace 05/09/20 2108    Fredia Sorrow, MD 05/17/20 1650

## 2020-05-09 NOTE — ED Triage Notes (Signed)
Patient comes to the ED after a fall today after standing up from a chair and losing his balance, falling and hitting the back of his head on the hardwood floor. Patient takes Plavix for history of blood clots.  Patient spouse reports that patient has history of a stroke and Parkinsons.

## 2020-05-12 ENCOUNTER — Telehealth: Payer: Self-pay | Admitting: Urology

## 2020-05-12 NOTE — Telephone Encounter (Signed)
Pts caretaker called and asked for a nurse return call regarding a medication issue.

## 2020-05-14 ENCOUNTER — Emergency Department (HOSPITAL_COMMUNITY)
Admission: EM | Admit: 2020-05-14 | Discharge: 2020-05-14 | Disposition: A | Discharge status: Home / Self Care | Payer: Medicare HMO | Attending: Emergency Medicine | Admitting: Emergency Medicine

## 2020-05-14 ENCOUNTER — Other Ambulatory Visit: Payer: Self-pay

## 2020-05-14 ENCOUNTER — Encounter (HOSPITAL_COMMUNITY): Payer: Self-pay | Admitting: *Deleted

## 2020-05-14 ENCOUNTER — Emergency Department (HOSPITAL_COMMUNITY): Payer: Medicare HMO

## 2020-05-14 DIAGNOSIS — K5900 Constipation, unspecified: Secondary | ICD-10-CM | POA: Diagnosis not present

## 2020-05-14 DIAGNOSIS — R1032 Left lower quadrant pain: Secondary | ICD-10-CM

## 2020-05-14 DIAGNOSIS — Z8551 Personal history of malignant neoplasm of bladder: Secondary | ICD-10-CM | POA: Diagnosis not present

## 2020-05-14 DIAGNOSIS — Z7901 Long term (current) use of anticoagulants: Secondary | ICD-10-CM | POA: Diagnosis not present

## 2020-05-14 DIAGNOSIS — I10 Essential (primary) hypertension: Secondary | ICD-10-CM | POA: Insufficient documentation

## 2020-05-14 LAB — COMPREHENSIVE METABOLIC PANEL
ALT: 7 U/L (ref 0–44)
AST: 22 U/L (ref 15–41)
Albumin: 4.1 g/dL (ref 3.5–5.0)
Alkaline Phosphatase: 67 U/L (ref 38–126)
Anion gap: 11 (ref 5–15)
BUN: 20 mg/dL (ref 8–23)
CO2: 26 mmol/L (ref 22–32)
Calcium: 9.2 mg/dL (ref 8.9–10.3)
Chloride: 98 mmol/L (ref 98–111)
Creatinine, Ser: 1.52 mg/dL — ABNORMAL HIGH (ref 0.61–1.24)
GFR calc Af Amer: 50 mL/min — ABNORMAL LOW (ref >60)
GFR calc non Af Amer: 43 mL/min — ABNORMAL LOW (ref >60)
Glucose, Bld: 171 mg/dL — ABNORMAL HIGH (ref 70–99)
Potassium: 3.5 mmol/L (ref 3.5–5.1)
Sodium: 135 mmol/L (ref 135–145)
Total Bilirubin: 1.5 mg/dL — ABNORMAL HIGH (ref 0.3–1.2)
Total Protein: 7.1 g/dL (ref 6.5–8.1)

## 2020-05-14 LAB — URINALYSIS, ROUTINE W REFLEX MICROSCOPIC
Bacteria, UA: NONE SEEN
Bilirubin Urine: NEGATIVE
Glucose, UA: 50 mg/dL — AB
Hgb urine dipstick: NEGATIVE
Ketones, ur: NEGATIVE mg/dL
Nitrite: NEGATIVE
Protein, ur: NEGATIVE mg/dL
Specific Gravity, Urine: 1.016 (ref 1.005–1.030)
pH: 5 (ref 5.0–8.0)

## 2020-05-14 LAB — CBC WITH DIFFERENTIAL/PLATELET
Abs Immature Granulocytes: 0.01 10*3/uL (ref 0.00–0.07)
Basophils Absolute: 0 10*3/uL (ref 0.0–0.1)
Basophils Relative: 1 %
Eosinophils Absolute: 0.1 10*3/uL (ref 0.0–0.5)
Eosinophils Relative: 2 %
HCT: 46.1 % (ref 39.0–52.0)
Hemoglobin: 14.3 g/dL (ref 13.0–17.0)
Immature Granulocytes: 0 %
Lymphocytes Relative: 30 %
Lymphs Abs: 1.7 10*3/uL (ref 0.7–4.0)
MCH: 25.7 pg — ABNORMAL LOW (ref 26.0–34.0)
MCHC: 31 g/dL (ref 30.0–36.0)
MCV: 82.9 fL (ref 80.0–100.0)
Monocytes Absolute: 0.4 10*3/uL (ref 0.1–1.0)
Monocytes Relative: 8 %
Neutro Abs: 3.4 10*3/uL (ref 1.7–7.7)
Neutrophils Relative %: 59 %
Platelets: 232 10*3/uL (ref 150–400)
RBC: 5.56 MIL/uL (ref 4.22–5.81)
RDW: 14 % (ref 11.5–15.5)
WBC: 5.7 10*3/uL (ref 4.0–10.5)
nRBC: 0 % (ref 0.0–0.2)

## 2020-05-14 LAB — LIPASE, BLOOD: Lipase: 18 U/L (ref 11–51)

## 2020-05-14 MED ORDER — FLEET ENEMA 7-19 GM/118ML RE ENEM: 1.0000 | ENEMA | Freq: Every day | RECTAL | 0 refills | Status: DC | PRN

## 2020-05-14 MED ORDER — POLYETHYLENE GLYCOL 3350 17 GM/SCOOP PO POWD
17.0000 g | Freq: Every day | ORAL | 0 refills | Status: AC
Start: 2020-05-14 — End: ?

## 2020-05-14 MED ORDER — ONDANSETRON HCL 4 MG/2ML IJ SOLN
4.0000 mg | Freq: Once | INTRAMUSCULAR | Status: AC
  Administered 2020-05-14: 4 mg via INTRAVENOUS
  Filled 2020-05-14: qty 2

## 2020-05-14 MED ORDER — MORPHINE SULFATE (PF) 4 MG/ML IV SOLN
4.0000 mg | Freq: Once | INTRAVENOUS | Status: AC
  Administered 2020-05-14: 4 mg via INTRAVENOUS
  Filled 2020-05-14: qty 1

## 2020-05-14 MED ORDER — IOHEXOL 300 MG/ML  SOLN
80.0000 mL | Freq: Once | INTRAMUSCULAR | Status: AC | PRN
  Administered 2020-05-14: 80 mL via INTRAVENOUS

## 2020-05-14 NOTE — ED Notes (Signed)
Seen Dr Quintin Alto in [place] on Tuesday and given Cipro and Flagyl (2 doses).

## 2020-05-14 NOTE — Telephone Encounter (Signed)
Pt went to ER and wife wanted to confirm that we have given pt cipro in office before. Wife was verifying allergies. Denies any reaction to cipro.

## 2020-05-14 NOTE — Discharge Instructions (Addendum)
You have been evaluated for your abdominal pain.  Fortunately your CT scan did not show any acute infectious problem causing your pain.  It appears you are constipated.  Please use Fleet enema, as well as using MiraLAX to help with your bowel movement which may improve your pain.  Follow-up closely with your doctor for further care.  Return if you have any concern

## 2020-05-14 NOTE — ED Provider Notes (Signed)
[facility] Provider Note   CSN: [id number] Arrival date & time: 05/14/20  1230     History Chief Complaint  Patient presents with  . Abdominal Pain    Mike Nixon is a 79 y.o. male.  The history is provided by the patient and the spouse. No language interpreter was used.  Abdominal Pain    79 year old male with history of bladder cancer, PUD, CVA, hypertension, Parkinson disease, presenting for evaluation of abdominal pain.  Due to prior stroke, patient has a speech impediment and history is difficult to obtain.  Additional history was obtained through spouse who is at bedside.  For the past month patient has had recurrent pain primarily to his left lower quadrant.  Pain usually brought on by eating.  Pain is described as an uncomfortable sensation, localized left lower quadrant, nonradiating, is waxing and waning and has become progressively worse.  No report of fever or chills no chest pain shortness of breath productive cough no nausea vomiting or diarrhea no blood in the stool no dysuria.  Patient was seen by primary care provider 4 days ago for his symptoms.  He was started on Cipro and Flagyl for suspected colitis/diverticulitis due to the presenting symptoms.  He was recommended to come to ER if symptoms not improved.  Patient is up-to-date with Covid vaccination.  His pain is moderate in severity.  Past Medical History:  Diagnosis Date  . Anticoagulant long-term use    PLAVIX  . Benign localized prostatic hyperplasia with lower urinary tract symptoms (LUTS)   . Bladder tumor   . Borderline diabetes    watches diet  . ED (erectile dysfunction) of organic origin   . Gait disturbance, post-stroke    uses wheelchair long distance  . Glaucoma, right eye   . History of bleeding peptic ulcer    03/ 1979  s/p  surgery  . History of concussion    1987 no loc-- no residual  . History of CVA with residual deficit    05/ 2016 CYPTOGENIC CVA --- RESIDUAL  GAIT DISTURBANCE AND SIALORRHEA ACCESS (DROOLING)  . Hyperlipidemia   . Hypertension   . Mild atherosclerosis of both carotid arteries    per duplex from outside facitlity 04-08-2015 less then 50% stenosis bilateral ICA  . Parkinson's disease [facility]) neurologist -- dr r. Mitzi Hansen falconer ([facility], [facility])   dx 2015  . PBA (pseudobulbar affect)    laughter-- residual from cva 05/ 2016  . PFO (patent foramen ovale)    per TEE 08-18-2016  . Speech impairment    post-stroke  . Status post placement of implantable loop recorder 08/18/2016   placed by dr allred  . Wears glasses     Patient Active Problem List   Diagnosis Date Noted  . OAB (overactive bladder) 03/24/2020  . Malignant neoplasm of overlapping sites of bladder ([place]) 01/14/2020  . BPH (benign prostatic hyperplasia) 01/13/2019  . Acute lower UTI 01/13/2019  . Sepsis ([place]) 01/12/2019  . Chest pain in adult 10/11/2018  . Hematuria 10/11/2018  . Community acquired pneumonia 10/11/2018  . CVA (cerebral vascular accident) ([place]) 10/11/2018  . Parkinson's disease ([place]) 10/11/2018  . Benign essential HTN 10/11/2018  . HLD (hyperlipidemia) 10/11/2018  . Bladder tumor 05/07/2017    Past Surgical History:  Procedure Laterality Date  . APPENDECTOMY  1978  . CATARACT EXTRACTION W/ INTRAOCULAR LENS IMPLANT Right 2014 approx.  . CYSTOSCOPY W/ URETERAL STENT PLACEMENT Bilateral 05/07/2017   Procedure: CYSTOSCOPY  WITH RETROGRADE PYELOGRAM/URETERAL;  Surgeon: Cleon Gustin, MD;  Location: [facility];  Service: Urology;  Laterality: Bilateral;  . EP IMPLANTABLE DEVICE N/A 08/18/2016   Procedure: Loop Recorder Insertion;  Surgeon: Thompson Grayer, MD;  Location: [place] [facility];  Service: Cardiovascular;  Laterality: N/A;  . REMOVAL NODULES BILATERAL FLANK  2008 approx.   per pt benign fatty tissue  . REPAIR OF PERFORATED ULCER  01/1978   bleeding peptic ulcer  . TEE WITHOUT CARDIOVERSION  N/A 08/18/2016   Procedure: TRANSESOPHAGEAL ECHOCARDIOGRAM (TEE);  Surgeon: Sueanne Margarita, MD;  Location: [facility] ENDOSCOPY;  Service: Cardiovascular;  Laterality: N/A; no evidence thrombus;  mobile atrial septum w/ positive bubble study consistent with PFO/ trivial AR, mild TR  . TRANSTHORACIC ECHOCARDIOGRAM  04/09/2015   ef 78-29%, grade 1 diastolic dysfunction/  mild AV sclerosis without stenosis/  mild TR,  trivial MR and PR  . TRANSURETHRAL RESECTION OF BLADDER TUMOR N/A 05/07/2017   Procedure: TRANSURETHRAL RESECTION OF BLADDER TUMOR (TURBT);  Surgeon: Cleon Gustin, MD;  Location: [facility];  Service: Urology;  Laterality: N/A;  . TRANSURETHRAL RESECTION OF BLADDER TUMOR N/A 06/18/2017   Procedure: TRANSURETHRAL RESECTION OF BLADDER TUMOR (TURBT);  Surgeon: Cleon Gustin, MD;  [facility];  Service: Urology;  Laterality: N/A;  . TRANSURETHRAL RESECTION OF BLADDER TUMOR N/A 09/11/2018   Procedure: TRANSURETHRAL RESECTION OF BLADDER TUMOR (TURBT);  Surgeon: Cleon Gustin, MD;  Location: [facility];  Service: Urology;  Laterality: N/A;  . TRANSURETHRAL RESECTION OF PROSTATE  08-20-2013   at [facility] History  Problem Relation Age of Onset  . Stroke Sister     Social History   Tobacco Use  . Smoking status: Never Smoker  . Smokeless tobacco: Never Used  Vaping Use  . Vaping Use: Never used  Substance Use Topics  . Alcohol use: No  . Drug use: No    Home Medications Prior to Admission medications   Medication Sig Start Date End Date Taking? Authorizing Provider  acetaminophen (TYLENOL) 500 MG tablet Take 1,000 mg by mouth every 6 (six) hours as needed for mild pain or headache.   Yes [provider]  atorvastatin (LIPITOR) 40 MG tablet Take 40 mg by mouth every evening.   Yes [provider]  Carbidopa-Levodopa ER (RYTARY) 36.25-145 MG CPCR Take 3 capsules by mouth 4 (four) times daily.    Yes [provider]    chlorhexidine (PERIDEX) 0.12 % solution 15 mLs by Mouth Rinse route 2 (two) times daily. 04/19/20  Yes [provider]  ciprofloxacin (CIPRO) 500 MG tablet Take 500 mg by mouth 2 (two) times daily. 05/11/20  Yes [provider]  clopidogrel (PLAVIX) 75 MG tablet Take 1 tablet (75 mg total) by mouth daily. 01/16/19  Yes Johnson, Clanford L, MD  furosemide (LASIX) 20 MG tablet Take 1 tablet (20 mg total) by mouth as needed. Patient taking differently: Take 20 mg by mouth daily as needed for fluid.  08/20/18 05/14/20 Yes Branch, Alphonse Guild, MD  hydrochlorothiazide (HYDRODIURIL) 25 MG tablet Take 25 mg by mouth daily. 01/12/20  Yes [provider]  meclizine (ANTIVERT) 25 MG tablet Take 25 mg by mouth 3 (three) times daily as needed for dizziness.    Yes [provider]  metroNIDAZOLE (FLAGYL) 500 MG tablet Take 500 mg by mouth 2 (two) times daily. 05/12/20  Yes [provider]  NOURIANZ 40 MG TABS Take  1 tablet by mouth daily.  12/27/19  Yes [provider]  NUEDEXTA 20-10 MG CAPS Take 1 capsule by mouth 2 (two) times daily. 06/02/18  Yes [provider]  TOVIAZ 8 MG TB24 tablet TAKE 1 TABLET BY MOUTH EVERY DAY Patient taking differently: Take 8 mg by mouth daily.  04/24/20  Yes McKenzie, Candee Furbish, MD  Travoprost, BAK Free, (TRAVATAN) 0.004 % SOLN ophthalmic solution Place 1 drop into both eyes at bedtime. 04/15/20  Yes [provider]    Allergies    Benazepril, Doxycycline, Levofloxacin, Simvastatin, Tamsulosin, Tramadol hcl, and Sulfa antibiotics  Review of Systems   Review of Systems  Gastrointestinal: Positive for abdominal pain.  All other systems reviewed and are negative.   Physical Exam Updated Vital Signs BP 140/81   Pulse 77   Temp 98.1 F (36.7 C)   Resp (!) 23   SpO2 97%   Physical Exam Vitals and nursing note reviewed.  Constitutional:      Appearance: He is well-developed.     Comments: Elderly male  appears uncomfortable.  HENT:     Head: Atraumatic.  Eyes:     Conjunctiva/sclera: Conjunctivae normal.  Cardiovascular:     Rate and Rhythm: Normal rate and regular rhythm.  Pulmonary:     Effort: Pulmonary effort is normal.     Breath sounds: Normal breath sounds.  Abdominal:     General: Abdomen is flat.     Palpations: Abdomen is soft.     Tenderness: There is abdominal tenderness in the left lower quadrant. There is no guarding or rebound.     Hernia: No hernia is present.  Musculoskeletal:     Cervical back: Neck supple.  Skin:    Findings: No rash.  Neurological:     Mental Status: He is alert.  Psychiatric:        Mood and Affect: Mood normal.     ED Results / Procedures / Treatments   Labs (all labs ordered are listed, but only abnormal results are displayed) Labs Reviewed  CBC WITH DIFFERENTIAL/PLATELET - Abnormal; Notable for the following components:      Result Value   MCH 25.7 (*)    All other components within normal limits  COMPREHENSIVE METABOLIC PANEL - Abnormal; Notable for the following components:   Glucose, Bld 171 (*)    Creatinine, Ser 1.52 (*)    Total Bilirubin 1.5 (*)    GFR calc non Af Amer 43 (*)    GFR calc Af Amer 50 (*)    All other components within normal limits  URINALYSIS, ROUTINE W REFLEX MICROSCOPIC - Abnormal; Notable for the following components:   Glucose, UA 50 (*)    Leukocytes,Ua TRACE (*)    All other components within normal limits  LIPASE, BLOOD    EKG None  Radiology CT ABDOMEN PELVIS W CONTRAST  Result Date: 05/14/2020 CLINICAL DATA:  79 year old male with left-sided abdominal pain and suspicion of diverticulitis EXAM: CT ABDOMEN AND PELVIS WITH CONTRAST TECHNIQUE: Multidetector CT imaging of the abdomen and pelvis was performed using the standard protocol following bolus administration of intravenous contrast. CONTRAST:  63mL OMNIPAQUE IOHEXOL 300 MG/ML  SOLN COMPARISON:  01/12/2019 FINDINGS: Lower chest: No acute  finding of the lower chest. Atelectasis dependent lung bases. Hepatobiliary: Unremarkable liver.  Unremarkable gallbladder. Pancreas: Unremarkable Spleen: Unremarkable Adrenals/Urinary Tract: - Right adrenal gland:  Unremarkable - Left adrenal gland: Unremarkable. - Right kidney: No hydronephrosis, nephrolithiasis, inflammation, or ureteral dilation. No focal lesion. -  Left Kidney: No hydronephrosis, nephrolithiasis, inflammation, or ureteral dilation. Cyst on the posterior cortex of the left kidney. - Urinary Bladder: Partial distention of urinary bladder. Stomach/Bowel: - Stomach: Surgical changes at the esophageal hiatus. Unremarkable stomach. - Small bowel: Unremarkable small bowel.  No abnormal distension. There is a short segment loop of small bowel in the right pelvis which enters the right inguinal canal. No evidence of obstruction or entrapment. - Appendix: Appendix is not visualized, however, no inflammatory changes are present adjacent to the cecum to indicate an appendicitis. - Colon: Large stool burden. No focal inflammatory changes or wall thickening of the descending colon/sigmoid colon. Redundancy of the descending colon and sigmoid colon. There has been interval resolution of the inflammatory changes in the presacral space adjacent to the rectum. Vascular/Lymphatic: No significant atherosclerosis. No lymphadenopathy. Reproductive: Transverse diameter of the prostate measures greater than 5.7 cm. Prominence at the bladder base. Other: Right inguinal hernia which contains a short segment of small bowel. No evidence of obstruction. Fat containing left inguinal hernia. Musculoskeletal: No acute displaced fracture. Degenerative changes of thoracolumbar spine. IMPRESSION: CT is negative for evidence of acute diverticulitis. No acute CT finding is identified to account for left abdominal pain. Large stool burden, potentially contributing to symptoms of constipation. Since the prior CT there has been  resolution of the inflammatory changes adjacent to the rectum and in the pre sacral space. Right inguinal hernia, which contains a short segment of small bowel. No evidence of obstruction/entrapment. Prostatomegaly. Additional ancillary findings as above. Electronically Signed   By: Corrie Mckusick D.O.   On: 05/14/2020 16:07    Procedures Procedures (including critical care time)  Medications Ordered in ED Medications  morphine 4 MG/ML injection 4 mg (4 mg Intravenous Given 05/14/20 1444)  ondansetron (ZOFRAN) injection 4 mg (4 mg Intravenous Given 05/14/20 1443)  iohexol (OMNIPAQUE) 300 MG/ML solution 80 mL (80 mLs Intravenous Contrast Given 05/14/20 1526)    ED Course  I have reviewed the triage vital signs and the nursing notes.  Pertinent labs & imaging results that were available during my care of the patient were reviewed by me and considered in my medical decision making (see chart for details).    MDM Rules/Calculators/A&P                          BP 129/89   Pulse 77   Temp 98.1 F (36.7 C)   Resp (!) 24   SpO2 95%   Final Clinical Impression(s) / ED Diagnoses Final diagnoses:  LLQ pain  Constipation, unspecified constipation type    Rx / DC Orders ED Discharge Orders         Ordered    sodium phosphate (FLEET) 7-19 GM/118ML ENEM  Daily PRN     Discontinue  Reprint     05/14/20 1736    polyethylene glycol powder (GLYCOLAX/MIRALAX) 17 GM/SCOOP powder  Daily     Discontinue  Reprint     05/14/20 1736         2:12 PM Patient here with left lower quadrant abdominal pain.  Currently on Cipro and Flagyl for the past 2 to 3 days for suspected colitis/diverticulitis by PCP.  He does have reproducible left lower quadrant tenderness on exam.  He appears uncomfortable.  Will obtain labs, and will obtain abdominal pelvis CT scan for further evaluation.  Pain medication given.  Care discussed with Dr. Roderic Palau.    5:31 PM UA without signs of  urinary tract infection, normal  white blood cells, normal H&H, mild AKI with creatinine of 1.52.  Patient received IV fluid here.  Normal lipase.  An abdominal and pelvic CT scan demonstrate no acute findings identified to account for patient's pain.  Patient does have large stool burden which may contribute to his symptoms.  He has redundancy of the descending colon and the sigmoid colon.  He has interval resolutions of interval changes in the presacral space adjacent to the rectum.  I performed a digital rectal exam, no evidence of stool impaction.  At this time encourage patient to use enema at home as well as MiraLAX and other stool softener.  Outpatient follow-up recommended.  Return precaution discussed.  MiraLAX and stool softener.  Encourage patient to follow-up outpatient for further management.  Return precaution discussed.   Domenic Moras, PA-C 05/14/20 1738    Milton Ferguson, MD 05/17/20 [phone]

## 2020-05-14 NOTE — ED Triage Notes (Signed)
Abdominal pain for 2 weeks

## 2020-05-17 ENCOUNTER — Ambulatory Visit: Payer: Medicare HMO | Admitting: Urology

## 2020-05-17 ENCOUNTER — Other Ambulatory Visit: Payer: Self-pay | Admitting: Urology

## 2020-05-21 ENCOUNTER — Telehealth: Payer: Self-pay | Admitting: Emergency Medicine

## 2020-05-21 NOTE — Telephone Encounter (Signed)
Patient and wife Mike Nixon notified that loop recorder has reached RRT . Patient request loop recorder extraction. Will forward to scheduling to make appointment. Patient to unplug monitor and return kit will be sent. CLR

## 2020-05-24 ENCOUNTER — Encounter: Payer: Self-pay | Admitting: Urology

## 2020-05-24 ENCOUNTER — Other Ambulatory Visit: Payer: Self-pay

## 2020-05-24 ENCOUNTER — Ambulatory Visit (INDEPENDENT_AMBULATORY_CARE_PROVIDER_SITE_OTHER): Payer: Medicare HMO | Admitting: Urology

## 2020-05-24 VITALS — BP 136/78 | HR 71 | Temp 97.5°F | Ht 67.0 in | Wt 165.0 lb

## 2020-05-24 DIAGNOSIS — N3281 Overactive bladder: Secondary | ICD-10-CM | POA: Diagnosis not present

## 2020-05-24 NOTE — Progress Notes (Signed)
05/24/2020 2:59 PM   Mike Nixon August 31, 1941 [id number]  Referring provider: [name], MD [place],  [place] [id number]  Urge incontinence  HPI: [name] is a 79yo here for followup for overactive bladder. Since last visit visit he has stopped the toviaz due to headaches and constipation. He constipation is controlled with fiber and now his urgency and incontinence has improved on a bowel regiment. Nocturia down to 1x.    PMH: Past Medical History:  Diagnosis Date  . Anticoagulant long-term use    PLAVIX  . Benign localized prostatic hyperplasia with lower urinary tract symptoms (LUTS)   . Bladder tumor   . Borderline diabetes    watches diet  . ED (erectile dysfunction) of organic origin   . Gait disturbance, post-stroke    uses wheelchair long distance  . Glaucoma, right eye   . History of bleeding peptic ulcer    03/ 1979  s/p  surgery  . History of concussion    1987 no loc-- no residual  . History of CVA with residual deficit    05/ 2016 CYPTOGENIC CVA --- RESIDUAL GAIT DISTURBANCE AND SIALORRHEA ACCESS (DROOLING)  . Hyperlipidemia   . Hypertension   . Mild atherosclerosis of both carotid arteries    per duplex from outside facitlity 04-08-2015 less then 50% stenosis bilateral ICA  . Parkinson's disease [place]) neurologist -- Mike Mike Nixon ([facility], [facility])   dx 2015  . PBA (pseudobulbar affect)    laughter-- residual from cva 05/ 2016  . PFO (patent foramen ovale)    per TEE 08-18-2016  . Speech impairment    post-stroke  . Status post placement of implantable loop recorder 08/18/2016   placed by Mike Nixon  . Wears glasses     Surgical History: Past Surgical History:  Procedure Laterality Date  . APPENDECTOMY  1978  . CATARACT EXTRACTION W/ INTRAOCULAR LENS IMPLANT Right 2014 approx.  . CYSTOSCOPY W/ URETERAL STENT PLACEMENT Bilateral 05/07/2017   Procedure: CYSTOSCOPY WITH RETROGRADE PYELOGRAM/URETERAL;   Surgeon: Mike Gustin, MD;  Location[facility];  Service: Urology;  Laterality: Bilateral;  . EP IMPLANTABLE DEVICE N/A 08/18/2016   Procedure: Loop Recorder Insertion;  Surgeon: Mike Grayer, MD;  Location: [place] [facility];  Service: Cardiovascular;  Laterality: N/A;  . REMOVAL NODULES BILATERAL FLANK  2008 approx.   per pt benign fatty tissue  . REPAIR OF PERFORATED ULCER  01/1978   bleeding peptic ulcer  . TEE WITHOUT CARDIOVERSION N/A 08/18/2016   Procedure: TRANSESOPHAGEAL ECHOCARDIOGRAM (TEE);  Surgeon: Mike Margarita, MD;  Location: [facility] ENDOSCOPY;  Service: Cardiovascular;  Laterality: N/A; no evidence thrombus;  mobile atrial septum w/ positive bubble study consistent with PFO/ trivial AR, mild TR  . TRANSTHORACIC ECHOCARDIOGRAM  04/09/2015   ef 73-41%, grade 1 diastolic dysfunction/  mild AV sclerosis without stenosis/  mild TR,  trivial MR and PR  . TRANSURETHRAL RESECTION OF BLADDER TUMOR N/A 05/07/2017   Procedure: TRANSURETHRAL RESECTION OF BLADDER TUMOR (TURBT);  Surgeon: Mike Gustin, MD;  Location[facility];  Service: Urology;  Laterality: N/A;  . TRANSURETHRAL RESECTION OF BLADDER TUMOR N/A 06/18/2017   Procedure: TRANSURETHRAL RESECTION OF BLADDER TUMOR (TURBT);  Surgeon: Mike Gustin, MD;  Location[facility];  Service: Urology;  Laterality: N/A;  . TRANSURETHRAL RESECTION OF BLADDER TUMOR N/A 09/11/2018   Procedure: TRANSURETHRAL RESECTION OF BLADDER TUMOR (TURBT);  Surgeon: Mike Gustin, MD;  Location: [facility];  Service: Urology;  Laterality: N/A;  . TRANSURETHRAL RESECTION OF PROSTATE  08-20-2013   at [facility] Medications:  Allergies as of 05/24/2020      Reactions   Benazepril Cough   Doxycycline Other (See Comments)   Unknown   Levofloxacin Hives, Swelling   Simvastatin Other (See Comments)   Pt does not remember what the reaction was. ?muscle aches   Tamsulosin Hives, Swelling   Tramadol Hcl Hives,  Swelling   Sulfa Antibiotics Hives, Swelling, Rash, Other (See Comments)   Severe rash      Medication List       Accurate as of May 24, 2020  2:59 PM. If you have any questions, ask your nurse or doctor.        acetaminophen 500 MG tablet Commonly known as: TYLENOL Take 1,000 mg by mouth every 6 (six) hours as needed for mild pain or headache.   atorvastatin 40 MG tablet Commonly known as: LIPITOR Take 40 mg by mouth every evening.   chlorhexidine 0.12 % solution Commonly known as: PERIDEX 15 mLs by Mouth Rinse route 2 (two) times daily.   ciprofloxacin 500 MG tablet Commonly known as: CIPRO Take 500 mg by mouth 2 (two) times daily.   clopidogrel 75 MG tablet Commonly known as: PLAVIX Take 1 tablet (75 mg total) by mouth daily.   furosemide 20 MG tablet Commonly known as: LASIX Take 1 tablet (20 mg total) by mouth as needed. What changed:   when to take this  reasons to take this   hydrochlorothiazide 25 MG tablet Commonly known as: HYDRODIURIL Take 25 mg by mouth daily.   meclizine 25 MG tablet Commonly known as: ANTIVERT Take 25 mg by mouth 3 (three) times daily as needed for dizziness.   metroNIDAZOLE 500 MG tablet Commonly known as: FLAGYL Take 500 mg by mouth 2 (two) times daily.   Nourianz 40 MG Tabs Generic drug: Istradefylline Take 1 tablet by mouth daily.   Nuedexta 20-10 MG capsule Generic drug: Dextromethorphan-quiNIDine Take 1 capsule by mouth 2 (two) times daily.   polyethylene glycol powder 17 GM/SCOOP powder Commonly known as: GLYCOLAX/MIRALAX Take 17 g by mouth daily.   Rytary 36.25-145 MG Cpcr Generic drug: Carbidopa-Levodopa ER Take 3 capsules by mouth 4 (four) times daily.   sodium phosphate 7-19 GM/118ML Enem Place 133 mLs (1 enema total) rectally daily as needed for severe constipation.   Toviaz 8 MG Tb24 tablet Generic drug: fesoterodine Take 1 tablet (8 mg total) by mouth daily.   Travoprost (BAK Free) 0.004 % Soln  ophthalmic solution Commonly known as: TRAVATAN Place 1 drop into both eyes at bedtime.       Allergies:  Allergies  Allergen Reactions  . Benazepril Cough  . Doxycycline Other (See Comments)    Unknown  . Levofloxacin Hives and Swelling  . Simvastatin Other (See Comments)    Pt does not remember what the reaction was. ?muscle aches  . Tamsulosin Hives and Swelling  . Tramadol Hcl Hives and Swelling  . Sulfa Antibiotics Hives, Swelling, Rash and Other (See Comments)    Severe rash    Family History: Family History  Problem Relation Age of Onset  . Stroke Sister     Social History:  reports that he has never smoked. He has never used smokeless tobacco. He reports that he does not drink alcohol and does not use drugs.  ROS: All other review of systems were reviewed and are  negative except what is noted above in HPI  Physical Exam: BP 136/78   Pulse 71   Temp (!) 97.5 F (36.4 C)   Ht 5\' 7"  (1.702 m)   Wt 165 lb (74.8 kg)   BMI 25.84 kg/m   Constitutional:  Alert and oriented, No acute distress. HEENT: [place] AT, moist mucus membranes.  Trachea midline, no masses. Cardiovascular: No clubbing, cyanosis, or edema. Respiratory: Normal respiratory effort, no increased work of breathing. GI: Abdomen is soft, nontender, nondistended, no abdominal masses GU: No CVA tenderness.  Lymph: No cervical or inguinal lymphadenopathy. Skin: No rashes, bruises or suspicious lesions. Neurologic: Grossly intact, no focal deficits, moving all 4 extremities. Psychiatric: Normal mood and affect.  Laboratory Data: Lab Results  Component Value Date   WBC 5.7 05/14/2020   HGB 14.3 05/14/2020   HCT 46.1 05/14/2020   MCV 82.9 05/14/2020   PLT 232 05/14/2020    Lab Results  Component Value Date   CREATININE 1.52 (H) 05/14/2020    No results found for: PSA  No results found for: TESTOSTERONE  Lab Results  Component Value Date   HGBA1C 6.4 (H) 06/12/2017    Urinalysis      Component Value Date/Time   COLORURINE YELLOW 05/14/2020 1440   APPEARANCEUR CLEAR 05/14/2020 1440   LABSPEC 1.016 05/14/2020 1440   PHURINE 5.0 05/14/2020 1440   GLUCOSEU 50 (A) 05/14/2020 1440   HGBUR NEGATIVE 05/14/2020 1440   BILIRUBINUR NEGATIVE 05/14/2020 1440   BILIRUBINUR small 03/24/2020 1519   KETONESUR NEGATIVE 05/14/2020 1440   PROTEINUR NEGATIVE 05/14/2020 1440   UROBILINOGEN negative (A) 03/24/2020 1519   NITRITE NEGATIVE 05/14/2020 1440   LEUKOCYTESUR TRACE (A) 05/14/2020 1440    Lab Results  Component Value Date   BACTERIA NONE SEEN 05/14/2020    Pertinent Imaging:  No results found for this or any previous visit.  No results found for this or any previous visit.  No results found for this or any previous visit.  No results found for this or any previous visit.  No results found for this or any previous visit.  No results found for this or any previous visit.  No results found for this or any previous visit.  No results found for this or any previous visit.   Assessment & Plan:    1. Overactive bladder: -improved with constipation improvement. He does not require an OAB med at this time. He will followup august for cystoscopy  No follow-ups on file.  Nicolette Bang, MD  [facility] Urology [place]

## 2020-05-24 NOTE — Patient Instructions (Signed)

## 2020-05-24 NOTE — Progress Notes (Signed)

## 2020-05-26 ENCOUNTER — Ambulatory Visit (INDEPENDENT_AMBULATORY_CARE_PROVIDER_SITE_OTHER): Payer: Medicare (Managed Care) | Admitting: Neurology

## 2020-05-26 ENCOUNTER — Encounter (INDEPENDENT_AMBULATORY_CARE_PROVIDER_SITE_OTHER): Payer: Self-pay | Admitting: Neurology

## 2020-05-26 VITALS — BP 145/78 | HR 88 | Ht 67.0 in | Wt 175.0 lb

## 2020-05-26 DIAGNOSIS — G20 Parkinson's disease: Secondary | ICD-10-CM

## 2020-05-26 DIAGNOSIS — R270 Ataxia, unspecified: Secondary | ICD-10-CM

## 2020-05-26 DIAGNOSIS — F482 Pseudobulbar affect: Secondary | ICD-10-CM

## 2020-05-26 DIAGNOSIS — R471 Dysarthria and anarthria: Secondary | ICD-10-CM

## 2020-05-26 DIAGNOSIS — I639 Cerebral infarction, unspecified: Secondary | ICD-10-CM

## 2020-05-26 NOTE — Progress Notes (Signed)
Subjective:     79 y/o M with hx HTN, HLD, borderline DM, glaucoma, prior CVA, presents to Movement Disorders clinic for f/u of possible Parkinson's Disease.     Remains in SLT, which is helping. Voice is easier to understand. Stopped PT/OT after a period. Graduated.     Walking is ok, has fallen some. No more episodes of LH/D reported. Tried going to [place] 40 but didn't tolerate so back at 20mg . Still likes to stay up at night and sleep during the day, but not as often. Started melatonin, and when he takes it, it does help. Still on Rytary 3 capsules of 145mg  4x daily, 5 hours apart. On Nuedexta now, which has resolved his crying episodes. Tolerating with no issue.     History retained from initial encounter:     History is mainly from family. He was diagnosed with Parkinson's Disease in 2015, with initial symptoms being changes to his gait.  He was walking slower and his movements were slowed overall.  They report also noting tremors in the legs and hands, unclear laterality. He noticed the tremors throughout the day, but the daughter notices it more with activity, when feeding himself.     The diagnosis of Parkinson's was considered, so he was started on Sinemet, however did not take it because it gave him GI issues. During that year, he had a number of TIAs, but then in 2016 had a completed CVA.  Described a 'clot in the back of his head' by family.  Afterwards, he was having issues with speech and swallowing. Swallow study was normal.     At some point then, the dx of Parkinson's was questioned, but "tests" were completed that confirmed, though unclear what that was.  Was put back on Sinemet.  Once he is on Sinemet, his walking and tightness improves, but they are concerned about progression as he is falling frequently now. No cognitive changes, just difficulty talking.    Weak spells happen in the morning, he tells family that he's weak. Can get to lie down, but doesn't feel dizzy, woozy or  lightheaded.  He feels that he starts to feel weak after the medicine.     Last complete PT in December last year. He has completed SLT which helped.    + uncontrollable laughing and crying noted, depression/anxiety and fatigue.     DATscan reviewed (08/10/2016):    Abnormal image grade 3: Virtually absent uptake bilaterally affecting both putamen and caudate nuclei.  This pattern of activity can be seen with Parkinson's disease or related syndromes.    No family history of Parkinson's Disease or tremor. No history significant head injury or LOC.  No history of neuroleptic or chemical exposure.     Prior history reviewed:    Jospeh Mangel was seen today in the movement disorders clinic for neurologic consultation at the request of [name], MD. The consultation is for the evaluation of PD. No records are available to me from his prior neurologist. Therefore, all history was obtained from the patient and wife. He was previously being seen in [place], [place]. He was dx with with PD about 3 years ago and wife states that his first sx was was slow handwriting and slow to eat and slight jaw tremor. He was started on carbidopa/levodopa 10/100 tid and pt doesn't think that it helped. It was taking it at 8am/1pm/8-9pm. Lunchtime is at noon. He takes the last at bedtime. However, he admits that he has not taken  it for months.    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 When she got to him, he was just laying on the floor. They called 911. He was not on ASA at the time. He was put on plavix and lipitor. Speech has been biggest issue since the stroke. States that he never went through speech therapy, even when he was in rehab. He did physical therapy only. R leg remains a bit weak after stroke.    07/18/16 update: The patient is seen today in follow-up. I have reviewed many records from several hospitalizations and also his prior neurologist records since our last visit. He  was first seen by his prior neurologist in [place] on 06/15/2014 with complaints of speech changes and dragging his right leg and trouble buttoning his clothing. On examination, she reports no bradykinesia at that visit, but she reports that he had lip tremor. He was dragging his right leg and his rapid alternating movements were normal. She did an MRI of the brain just following that visit and this was done on 07/06/2014. There was a small acute right basal ganglia infarct. He followed up with her just after that in August, 2015 and was started on levodopa, 10/100 3 times per day, as she felt that his infarct was acute, whereas his symptoms were for 6 months and he had right leg symptoms and right sided infarct. 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 In September, 2015 the notes said that the patient noted significant improvement on aspirin and a statin. He was then hospitalized in May, 2016 and she followed up with him after that and said that there was just mild slurring of speech. On 07/19/2015 she did have him wearing an event monitor for one month that was negative. It does appear that he had been off of levodopa ever since his infarct in May, 2016. I was able to get his hospital records from [facility] [place] [facility] from his infarction. He was admitted on 04/08/2015, but they reported left-sided weakness and dizziness. He had an echocardiogram on 04/09/2015 with an ejection fraction of 60-65%. An EEG was normal. An MRA of the neck was normal. An MRI of the brain stated that DWI appears to demonstrate multiple punctate areas of high signal involving the cerebellar hemispheres and cerebellar vermis. I did have the opportunity to review these images once they became available, and DWI was in fact positive in both cerebellar hemispheres and amongst the vermis. His carotid ultrasound was negative. There were minimal notes regarding his speech but nothing stated that it was particularly slurred. He was  then recently hospitalized once again more on 07/10/2016 and I again reviewed those records. He was hospitalized for inability to swallow. Pt states that swallowing issues are with liquids and not solids because he was trying to take too much in the mouth at once. An MRI without gadolinium was done. I did not get those images. There was reported to be a few old cerebellar infarctions. There is reported to be an old infarction in the left frontal region. DWI was negative. It was concluded that his swallowing issues were from Parkinsons disease and his refusal to take levodopa. He did have a barium swallow that was reported to show mild oropharyngeal dysphagia, but no dietary changes were made, with the exceptions of to take small bites and administer meds one at a time. He ended up having a very detailed  EMG at our office following these hospitalizations to rule out motor neuron disease and there was no evidence of this.    09/08/16 update: Pt returns accompanied by his wife who supplements the history. Since our last visit the patient had a DaT scan in 07/2016 and it was abnormal demonstrating decreased uptake, being virtually absent in the bilateral caudate and the putamen. Wife does state that when he was last hospitalized, he had been given levodopa and it helped but was d/c in the hospital. He also saw Dr. Johney Frame and had a TEE on 08/18/16 and that was normal with the exception of evidence of a PFO. He had a LE doppler on 08/18/16 and that was normal. He had a loop recorder implanted on 08/18/16 and as of 08/28/16 there were no episodes of atrial fibrillation.     The following portions of the patient's history were reviewed and updated as appropriate: allergies, current medications, past medical history, past social history, past surgical history and problem list.    Review of Systems  + vocal changes, gait disorder.  No recent illness. Denies fever, chills, cough, sinus pain, eye pain, eye redness, ear pain,  rhinorrhea, sore throat, chest pain, SOB, wheezing, abd pain, Nausea, Vomiting, diarrhea, constipation, dysuria, or rashes.  All other systems reviewed and are negative except as previously noted in the HPI.    Objective:     Height 60ft7, Weight 160lb  Constitutional: NAD   Eyes: Clear conjunctiva  Musculoskeletal: stooped posture, normal muscle bulk.  Integumentary: No abnormal rash noted  Psych: Flat affect, decreased blink rate. No more emotionality noted.     Neurological:   Language: Dysarthric. Difficult to understand.  Improved though.     Cranial Nerves:  III, IV, VI: Mild limitation to vertical gaze, No nystagmus, no palsies, no ptosis  V: Intact to LT V1-V3 distribution bilaterally.   VI: Symmetrical face and expression.   VIII: Hearing intact to finger rub bilaterally.     Motor Exam: R hand/UE weakness from prior CVA.      Sensation: Sensation reported intact to LT grossly through symmetrically.     Cerebellar:   Finger to Nose: intact bilaterally with mild terminal dysmetria on FTN.    Reflexes: 1+ symmetric.     Station/ Gait: Wider based, short stepped.  Shuffle.  Absent arm swing bilaterally, arms out.  Stooped. No freezing noted.     Fine motor use of both hands is slowed to RAM, finger tapping and open/close, R>L but more fluid with no freezing.  Foot tapping also reduced in this laterality.     Assessment:     79 y/o M with hx HTN, HLD, borderline DM, glaucoma, prior CVA, presents to Movement Disorders clinic for f/u of Parkinson's Disease. 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 It is unclear what symptoms are CVA related and which are PD related, though RUE issues appear now more CVA in origin as his PD symptoms are responding well here.  Will continue Rytary + Nourianz unchanged.     Appears better in a number of ways. Will continue SLT and stressed  need for continuing PT/OT exercises as well. Sleep is an issue still, with day/night reversal. Suggested melatonin nightly and if not better, can add a low dose of clonazepam.     Based on exam and disability, and per the recommendations as well  of his occupational therapist, I believe it is medically necessary that Mr. Kobashigawa be provided a motorized hospital bed to aid in sleep, transfers, and other ADLs involving the bed.  A standard bed even with side-rails would be inadequate and result in a safety risk.    Plan:     1) Parkinson's Disease -   - Continue Rytary to 3 capsules 145mg  4x daily, 5 hours apart.  - Continue Nourianz at 20mg  daily. Did not tolerate 40mg  daily.   - Cannot have MAO-B inhibitors d/t Nuedexta use.     2) Gait disorder - multifactorial; PD, comorbid medical issues, prior CVA.  A severe, chronic progressing condition. Fall risk   - Continue PT/OT exercises and continue home SLT; HOME rehab services of medical necessity d/t inability to travel safely, homebound status.   - Referred to home health for services.  - Discussed fall risk at length.     3) Dysarthria -   - Continue SLT and SLT exercises.     4) Prior CVA - continue Plavix and statin for secondary stroke prevention.  - screening as above.     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     RTC in 3 mo in person. Patient can follow up sooner if needed. In the meantime, patient will contact the office with any questions or concerns.     -----------------------------------------------------------    Approximately 30 minutes were spent on the day of service including face-to-face time with patient, coordinating care, record review and documentation.    Tiffney Haughton "Johnston Ebbs, MD  Director, Movement Disorders Specialist  [facility]  [facility]    [address] [place]   [address]  [place] [address]    [place], [place] [address]  T [phone]   F [phone]   T [phone]   F [phone]     [email or web address]

## 2020-05-27 ENCOUNTER — Ambulatory Visit (INDEPENDENT_AMBULATORY_CARE_PROVIDER_SITE_OTHER): Payer: Medicare (Managed Care) | Admitting: Neurology

## 2020-06-10 ENCOUNTER — Encounter (INDEPENDENT_AMBULATORY_CARE_PROVIDER_SITE_OTHER): Payer: Self-pay

## 2020-06-10 ENCOUNTER — Telehealth: Payer: Self-pay

## 2020-06-10 ENCOUNTER — Other Ambulatory Visit (INDEPENDENT_AMBULATORY_CARE_PROVIDER_SITE_OTHER): Payer: Self-pay

## 2020-06-10 DIAGNOSIS — G20A1 Parkinson's disease without dyskinesia, without mention of fluctuations: Secondary | ICD-10-CM

## 2020-06-10 DIAGNOSIS — R471 Dysarthria and anarthria: Secondary | ICD-10-CM

## 2020-06-10 DIAGNOSIS — G20 Parkinson's disease: Secondary | ICD-10-CM

## 2020-06-10 MED ORDER — ISTRADEFYLLINE 20 MG PO TABS
20.0000 mg | ORAL_TABLET | Freq: Every day | ORAL | 3 refills | Status: DC
Start: 2020-06-10 — End: 2021-05-26

## 2020-06-10 NOTE — Telephone Encounter (Signed)
Request received via In Basket          Nourianz 20mg Tablet  30/30          Initiating benefits investigation

## 2020-06-10 NOTE — Telephone Encounter (Signed)
No PA needed at this time (Exp 11/26/2020)          Nourianz 20mg  Tablet  30/30          Payable co-pay $0/30 or $0/90- Currently filling with [facility]

## 2020-07-14 ENCOUNTER — Ambulatory Visit: Payer: Medicare HMO | Admitting: Urology

## 2020-07-22 ENCOUNTER — Ambulatory Visit: Payer: Medicare HMO | Admitting: Urology

## 2020-07-28 ENCOUNTER — Other Ambulatory Visit (INDEPENDENT_AMBULATORY_CARE_PROVIDER_SITE_OTHER): Payer: Self-pay | Admitting: Neurology

## 2020-07-28 ENCOUNTER — Encounter (INDEPENDENT_AMBULATORY_CARE_PROVIDER_SITE_OTHER): Payer: Self-pay | Admitting: Neurology

## 2020-07-28 DIAGNOSIS — R270 Ataxia, unspecified: Secondary | ICD-10-CM

## 2020-07-28 DIAGNOSIS — G20 Parkinson's disease: Secondary | ICD-10-CM

## 2020-07-28 DIAGNOSIS — I639 Cerebral infarction, unspecified: Secondary | ICD-10-CM

## 2020-07-30 ENCOUNTER — Encounter (INDEPENDENT_AMBULATORY_CARE_PROVIDER_SITE_OTHER): Payer: Self-pay

## 2020-08-18 ENCOUNTER — Ambulatory Visit (INDEPENDENT_AMBULATORY_CARE_PROVIDER_SITE_OTHER): Payer: Medicare (Managed Care) | Admitting: Neurology

## 2020-08-18 ENCOUNTER — Encounter (INDEPENDENT_AMBULATORY_CARE_PROVIDER_SITE_OTHER): Payer: Self-pay | Admitting: Neurology

## 2020-08-18 VITALS — BP 159/97 | HR 67 | Wt 140.0 lb

## 2020-08-18 DIAGNOSIS — I639 Cerebral infarction, unspecified: Secondary | ICD-10-CM

## 2020-08-18 DIAGNOSIS — F482 Pseudobulbar affect: Secondary | ICD-10-CM

## 2020-08-18 DIAGNOSIS — R270 Ataxia, unspecified: Secondary | ICD-10-CM

## 2020-08-18 DIAGNOSIS — R471 Dysarthria and anarthria: Secondary | ICD-10-CM

## 2020-08-18 DIAGNOSIS — G20 Parkinson's disease: Secondary | ICD-10-CM

## 2020-08-18 NOTE — Progress Notes (Signed)
Subjective:     79 y/o M with hx HTN, HLD, borderline DM, glaucoma, prior CVA, presents to Movement Disorders clinic for f/u of possible Parkinson's Disease.     Did take a fall since last visit, fractured a rib. Has been falling more. Just restarted PT. Legs continue to stutter when walking. No LH/D reported.     Speech is 'hit or miss' every day. In the past, tried going to [place] 40 but didn't tolerate so back at 20mg . Still likes to stay up at night and sleep during the day, but not as often. Started melatonin, and when he takes it, it does help. Still on Rytary 3 capsules of 145mg  4x daily, 5 hours apart. On Nuedexta now, which has resolved his crying episodes. Tolerating with no issue.     History retained from initial encounter:     History is mainly from family. He was diagnosed with Parkinson's Disease in 2015, with initial symptoms being changes to his gait.  He was walking slower and his movements were slowed overall.  They report also noting tremors in the legs and hands, unclear laterality. He noticed the tremors throughout the day, but the daughter notices it more with activity, when feeding himself.     The diagnosis of Parkinson's was considered, so he was started on Sinemet, however did not take it because it gave him GI issues. During that year, he had a number of TIAs, but then in 2016 had a completed CVA.  Described a 'clot in the back of his head' by family.  Afterwards, he was having issues with speech and swallowing. Swallow study was normal.     At some point then, the dx of Parkinson's was questioned, but "tests" were completed that confirmed, though unclear what that was.  Was put back on Sinemet.  Once he is on Sinemet, his walking and tightness improves, but they are concerned about progression as he is falling frequently now. No cognitive changes, just difficulty talking.    Weak spells happen in the morning, he tells family that he's weak. Can get to lie down, but doesn't feel  dizzy, woozy or lightheaded.  He feels that he starts to feel weak after the medicine.     Last complete PT in December last year. He has completed SLT which helped.    + uncontrollable laughing and crying noted, depression/anxiety and fatigue.     DATscan reviewed (08/10/2016):    Abnormal image grade 3: Virtually absent uptake bilaterally affecting both putamen and caudate nuclei.  This pattern of activity can be seen with Parkinson's disease or related syndromes.    No family history of Parkinson's Disease or tremor. No history significant head injury or LOC.  No history of neuroleptic or chemical exposure.     Prior history reviewed:    Brian Escobar was seen today in the movement disorders clinic for neurologic consultation at the request of [name], MD. The consultation is for the evaluation of PD. No records are available to me from his prior neurologist. Therefore, all history was obtained from the patient and wife. He was previously being seen in [place], [place]. He was dx with with PD about 3 years ago and wife states that his first sx was was slow handwriting and slow to eat and slight jaw tremor. He was started on carbidopa/levodopa 10/100 tid and pt doesn't think that it helped. It was taking it at 8am/1pm/8-9pm. Lunchtime is at noon. He takes the last at bedtime. However,  he admits that he has not taken it for months.    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 When she got to him, he was just laying on the floor. They called 911. He was not on ASA at the time. He was put on plavix and lipitor. Speech has been biggest issue since the stroke. States that he never went through speech therapy, even when he was in rehab. He did physical therapy only. R leg remains a bit weak after stroke.    07/18/16 update: The patient is seen today in follow-up. I have reviewed many records from several hospitalizations and also his prior neurologist records since our  last visit. He was first seen by his prior neurologist in [place] on 06/15/2014 with complaints of speech changes and dragging his right leg and trouble buttoning his clothing. On examination, she reports no bradykinesia at that visit, but she reports that he had lip tremor. He was dragging his right leg and his rapid alternating movements were normal. She did an MRI of the brain just following that visit and this was done on 07/06/2014. There was a small acute right basal ganglia infarct. He followed up with her just after that in August, 2015 and was started on levodopa, 10/100 3 times per day, as she felt that his infarct was acute, whereas his symptoms were for 6 months and he had right leg symptoms and right sided infarct. 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 In September, 2015 the notes said that the patient noted significant improvement on aspirin and a statin. He was then hospitalized in May, 2016 and she followed up with him after that and said that there was just mild slurring of speech. On 07/19/2015 she did have him wearing an event monitor for one month that was negative. It does appear that he had been off of levodopa ever since his infarct in May, 2016. I was able to get his hospital records from [facility] from his infarction. He was admitted on 04/08/2015, but they reported left-sided weakness and dizziness. He had an echocardiogram on 04/09/2015 with an ejection fraction of 60-65%. An EEG was normal. An MRA of the neck was normal. An MRI of the brain stated that DWI appears to demonstrate multiple punctate areas of high signal involving the cerebellar hemispheres and cerebellar vermis. I did have the opportunity to review these images once they became available, and DWI was in fact positive in both cerebellar hemispheres and amongst the vermis. His carotid ultrasound was negative. There were minimal notes regarding his speech but nothing stated that it was particularly  slurred. He was then recently hospitalized once again more on 07/10/2016 and I again reviewed those records. He was hospitalized for inability to swallow. Pt states that swallowing issues are with liquids and not solids because he was trying to take too much in the mouth at once. An MRI without gadolinium was done. I did not get those images. There was reported to be a few old cerebellar infarctions. There is reported to be an old infarction in the left frontal region. DWI was negative. It was concluded that his swallowing issues were from Parkinsons disease and his refusal to take levodopa. He did have a barium swallow that was reported to show mild oropharyngeal dysphagia, but no dietary changes were made, with the exceptions of to take small bites and administer meds one at a time.  He ended up having a very detailed EMG at our office following these hospitalizations to rule out motor neuron disease and there was no evidence of this.    09/08/16 update: Pt returns accompanied by his wife who supplements the history. Since our last visit the patient had a DaT scan in 07/2016 and it was abnormal demonstrating decreased uptake, being virtually absent in the bilateral caudate and the putamen. Wife does state that when he was last hospitalized, he had been given levodopa and it helped but was d/c in the hospital. He also saw Dr. Johney Frame and had a TEE on 08/18/16 and that was normal with the exception of evidence of a PFO. He had a LE doppler on 08/18/16 and that was normal. He had a loop recorder implanted on 08/18/16 and as of 08/28/16 there were no episodes of atrial fibrillation.     The following portions of the patient's history were reviewed and updated as appropriate: allergies, current medications, past medical history, past social history, past surgical history and problem list.    Review of Systems  + vocal changes, gait disorder.  No recent illness. Denies fever, chills, cough, sinus pain, eye pain, eye  redness, ear pain, rhinorrhea, sore throat, chest pain, SOB, wheezing, abd pain, Nausea, Vomiting, diarrhea, constipation, dysuria, or rashes.  All other systems reviewed and are negative except as previously noted in the HPI.    Objective:     Height 43ft7, Weight 160lb  Constitutional: NAD   Eyes: Clear conjunctiva  Musculoskeletal: stooped posture, normal muscle bulk.  Integumentary: No abnormal rash noted  Psych: Flat affect, decreased blink rate. No more emotionality noted.     Neurological:   Language: Dysarthric. Difficult to understand.  Improved though.     Cranial Nerves:  III, IV, VI: Mild limitation to vertical gaze, No nystagmus, no palsies, no ptosis  V: Intact to LT V1-V3 distribution bilaterally.   VI: Symmetrical face and expression.   VIII: Hearing intact to finger rub bilaterally.     Motor Exam: R hand/UE weakness from prior CVA.      Sensation: Sensation reported intact to LT grossly through symmetrically.     Cerebellar:   Finger to Nose: intact bilaterally with mild terminal dysmetria on FTN.    Reflexes: 1+ symmetric.     Station/ Gait: Wider based, short stepped.  Shuffle.  Absent arm swing bilaterally, arms out.  Stooped. No freezing noted.     Fine motor use of both hands is slowed to RAM, finger tapping and open/close, R>L but more fluid with no freezing.  Foot tapping also reduced in this laterality.     Assessment:     79 y/o M with hx HTN, HLD, borderline DM, glaucoma, prior CVA, presents to Movement Disorders clinic for f/u of Parkinson's Disease. 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 It is unclear what symptoms are CVA related and which are PD related, though RUE issues appear now more CVA in origin.  Worsening though. Will send back to PT, and also try boosting the Rytary a bit to hopefully help. Discussed dosing, treatment expectations  and s/e of which to be aware. Answered all questions regarding this medication. Will restart SLT as well and stressed need to restart PT/OT.    Sleep is an issue still, with day/night reversal. Suggested melatonin nightly and if not better, can add a  low dose of clonazepam.     Based on exam and disability, and per the recommendations as well of his occupational therapist, I believe it is medically necessary that Brian Escobar be provided a motorized hospital bed to aid in sleep, transfers, and other ADLs involving the bed.  A standard bed even with side-rails would be inadequate and result in a safety risk.    Plan:     1) Parkinson's Disease -   - Change Rytary to 2 capsules 145mg  + 195mg  4x daily, 5 hours apart.  - Continue Nourianz at 20mg  daily. Did not tolerate 40mg  daily.   - Cannot have MAO-B inhibitors d/t Nuedexta use.     2) Gait disorder - multifactorial; PD, comorbid medical issues, prior CVA.  A severe, chronic progressing condition. Fall risk   - Restart PT/OT. HOME rehab services of medical necessity d/t inability to travel safely, homebound status.   - Referred to home health for services.  - Discussed fall risk at length.     3) Dysarthria -   - Restart SLT.    4) Prior CVA - continue Plavix and statin for secondary stroke prevention.  - screening as above.     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     RTC in 3 mo in person. Patient can follow up sooner if needed. In the meantime, patient will contact the office with any questions or concerns.     -----------------------------------------------------------    Approximately 30 minutes were spent on the day of service including face-to-face time with patient, coordinating care, record review and documentation.    Mariadelaluz Guggenheim "Johnston Ebbs, MD  Director, Movement Disorders Specialist  [facility]  [facility]    [address] [place]  [address]  [place], [address]    [place], [place] [address]  T [phone]   F [phone]   T [phone]   F [phone]     [email or web address]

## 2020-08-18 NOTE — Patient Instructions (Signed)
Our plan:     1) Change the Rytary to be 2 capsules of 145mg  + 1 capsule of 195mg , per dose, still 4x daily 5 hours apart.     2) Begin speech therapy as well as PT.     Keep me updated and return to clinic in 3 months.    Today's Visit:      In today's visit I reviewed your medications and records relating your health - prior testing, blood work, reports of other health care providers present in your electronic medical record.     If you have records from non-[facility] doctors please send to [place] or bring to next office visit.     A copy of today's visit will be sent to your referring doctor and/or primary care doctor.    Let me know if there are things we could have done better for your office visit.    Patient satisfaction survey:      If you receive a patient satisfaction survey, I would greatly appreciate it if you would complete it.     We are like a car dealer where we aim for a score of 9 or 10.  If you had an experience that was less than a 9 or 10, please let me know so we can improve. If you had a good experience today, please let us know too.  We value your feedback.     Contact me online:      Patient Portal online - Please sign up for MyChart -- this is the best way to communicate me.  There is a messaging feature which can send messages directly to my inbox.  It is the best way to communicate with me and get test results, medication refills or ask questions.     You can expect to get a response within 24-48 hours during weekdays - if you do not, resend your request and inform us we did not respond. My goal is for every question answered as soon as possible. Average response for a phone call is 3 days due to the volume we receive (which is why MyChart is preferred).    MyChart should not be used if you are having a medical emergency -- call 911 in that case.     Coupons for medication:      If you would like to see if samples or vouchers available for your medicines please consider checking online.   Most medicines have web sites where you can print coupons or vouchers for you co-pay.     For example: AutomobileBuzz.is,  DSLRemote.se, Namendaxr.com, http://www.walker-hill.info/, Rytary.com, Vimpat.com    Thank you for trusting me with your health.      Take care,      Meridith Romick "Johnston Ebbs, MD  Director, Movement Disorders Specialist  [facility]  [facility]    [email or web address]      [address]  [address]  [place] [address]    [place], [place] [address]  T [phone]   F [phone]   T [phone]   F [phone]

## 2020-08-25 ENCOUNTER — Encounter: Payer: Self-pay | Admitting: Urology

## 2020-08-25 ENCOUNTER — Telehealth: Payer: Self-pay

## 2020-08-25 ENCOUNTER — Ambulatory Visit (INDEPENDENT_AMBULATORY_CARE_PROVIDER_SITE_OTHER): Payer: Medicare HMO | Admitting: Urology

## 2020-08-25 ENCOUNTER — Other Ambulatory Visit: Payer: Self-pay

## 2020-08-25 VITALS — BP 174/102 | HR 67 | Temp 98.3°F | Ht 67.0 in | Wt 165.0 lb

## 2020-08-25 DIAGNOSIS — C678 Malignant neoplasm of overlapping sites of bladder: Secondary | ICD-10-CM | POA: Diagnosis not present

## 2020-08-25 LAB — URINALYSIS, ROUTINE W REFLEX MICROSCOPIC
Bilirubin, UA: NEGATIVE
Glucose, UA: NEGATIVE
Leukocytes,UA: NEGATIVE
Nitrite, UA: NEGATIVE
RBC, UA: NEGATIVE
Specific Gravity, UA: 1.025 (ref 1.005–1.030)
Urobilinogen, Ur: 0.2 mg/dL (ref 0.2–1.0)
pH, UA: 5 (ref 5.0–7.5)

## 2020-08-25 MED ORDER — CEPHALEXIN 500 MG PO CAPS: 500.0000 mg | ORAL_CAPSULE | Freq: Once | ORAL | 0 refills | Status: AC

## 2020-08-25 NOTE — Progress Notes (Signed)
Urological Symptom Review  Patient is experiencing the following symptoms: Hard to postpone urination Get up at night to urinate Erection problems (male only)   Review of Systems  Gastrointestinal (upper)  : Indigestion/heartburn  Gastrointestinal (lower) : Negative for lower GI symptoms  Constitutional : Night Sweats  Skin: Negative for skin symptoms  Eyes: Negative for eye symptoms  Ear/Nose/Throat : Negative for Ear/Nose/Throat symptoms  Hematologic/Lymphatic: Negative for Hematologic/Lymphatic symptoms  Cardiovascular : Negative for cardiovascular symptoms  Respiratory : Shortness of breath  Endocrine: Negative for endocrine symptoms  Musculoskeletal: Negative for musculoskeletal symptoms  Neurological: Negative for neurological symptoms  Psychologic: Negative for psychiatric symptoms

## 2020-08-25 NOTE — Telephone Encounter (Signed)
Pt called saying he took the one Keflex pill that was rxed for him today after cysto. He said now his leg was tingling and heart fluttering. Dr. Alyson Ingles said for pt to wait 1 hr to see if sx improved. If not or if suddenly worse for him to go to the ER. I could not understand what pt was saying so a younger woman was translating during this conversation. Notified pt and lady and they expressed understanding.

## 2020-08-25 NOTE — Patient Instructions (Signed)
Bladder Cancer  Bladder cancer is an abnormal growth of tissue in the bladder. The bladder is the balloon-like sac in the pelvis. It collects and stores urine that comes from the kidneys through the ureters. The bladder wall is made of layers. If cancer spreads into these layers and through the wall of the bladder, it becomes more difficult to treat. What are the causes? The cause of this condition is not known. What increases the risk? The following factors may make you more likely to develop this condition:  Smoking.  Workplace risks (occupational exposures), such as rubber, leather, textile, dyes, chemicals, and paint.  Being white.  Your age. Most people with bladder cancer are over the age of 55.  Being male.  Having chronic bladder inflammation.  Having a personal history of bladder cancer.  Having a family history of bladder cancer (heredity).  Having had chemotherapy or radiation therapy to the pelvis.  Having been exposed to arsenic. What are the signs or symptoms? Initial symptoms of this condition include:  Blood in the urine.  Painful urination.  Frequent bladder or urine infections.  Increase in urgency and frequency of urination. Advanced symptoms of this condition include:  Not being able to urinate.  Low back pain on one side.  Loss of appetite.  Weight loss.  Fatigue.  Swelling in the feet.  Bone pain. How is this diagnosed? This condition is diagnosed based on your medical history, a physical exam, urine tests, lab tests, imaging tests, and your symptoms. You may also have other tests or procedures done, such as:  A narrow tube being inserted into your bladder through your urethra (cystoscopy) in order to view the lining of your bladder for tumors.  A biopsy to sample the tumor to see if cancer is present. If cancer is present, it will then be staged to determine its severity and extent. Staging is an assessment of:  The size of the  tumor.  Whether the cancer has spread.  Where the cancer has spread. It is important to know how deeply into the bladder wall cancer has grown and whether cancer has spread to any other parts of your body. Staging may require blood tests or imaging tests, such as a CT scan, MRI, bone scan, or chest X-ray. How is this treated? Based on the stage of cancer, one treatment or a combination of treatments may be recommended. The most common forms of treatment are:  Surgery to remove the cancer. Procedures that may be done include transurethral resection and cystectomy.  Radiation therapy. This is high-energy X-rays or other particles. This is often used in combination with chemotherapy.  Chemotherapy. During this treatment, medicines are used to kill cancer cells.  Immunotherapy. This uses medicines to help your own immune system destroy cancer cells. Follow these instructions at home:  Take over-the-counter and prescription medicines only as told by your health care provider.  Maintain a healthy diet. Some of your treatments might affect your appetite.  Consider joining a support group. This may help you learn to cope with the stress of having bladder cancer.  Tell your cancer care team if you develop side effects. They may be able to recommend ways to relieve them.  Keep all follow-up visits as told by your health care provider. This is important. Where to find more information  American Cancer Society: www.cancer.org  National Cancer Institute (NCI): www.cancer.gov Contact a health care provider if:  You have symptoms of a urinary tract infection. These include: ?   Fever. ? Chills. ? Weakness. ? Muscle aches. ? Abdominal pain. ? Frequent and intense urge to urinate. ? Burning feeling in the bladder or urethra during urination. Get help right away if:  There is blood in your urine.  You cannot urinate.  You have severe pain or other symptoms that do not go  away. Summary  Bladder cancer is an abnormal growth of tissue in the bladder.  This condition is diagnosed based on your medical history, a physical exam, urine tests, lab tests, imaging tests, and your symptoms.  Based on the stage of cancer, surgery, chemotherapy, or a combination of treatments may be recommended.  Consider joining a support group. This may help you learn to cope with the stress of having bladder cancer. This information is not intended to replace advice given to you by your health care provider. Make sure you discuss any questions you have with your health care provider. Document Revised: 10/26/2017 Document Reviewed: 10/17/2016 Elsevier Patient Education  2020 Elsevier Inc.  

## 2020-08-25 NOTE — Addendum Note (Signed)
Addended by: Dorisann Frames on: 08/25/2020 01:43 PM   Modules accepted: Orders

## 2020-08-25 NOTE — Progress Notes (Signed)
   08/25/20  CC: followup bladder cancer  HPI: Mr Banwart is a 79yo here for followup for bladder cancer diagnosed in 2019, no worsening LUTS. No hematuria or dysuria  Blood pressure (!) 174/102, pulse 67, temperature 98.3 F (36.8 C), height 5\' 7"  (1.702 m), weight 165 lb (74.8 kg). NED. A&Ox3.   No respiratory distress   Abd soft, NT, ND Normal phallus with bilateral descended testicles  Cystoscopy Procedure Note  Patient identification was confirmed, informed consent was obtained, and patient was prepped using Betadine solution.  Lidocaine jelly was administered per urethral meatus.     Pre-Procedure: - Inspection reveals a normal caliber ureteral meatus.  Procedure: The flexible cystoscope was introduced without difficulty - No urethral strictures/lesions are present. - Enlarged prostate  - Normal bladder neck - Bilateral ureteral orifices identified - Bladder mucosa  reveals no ulcers, tumors, or lesions - No bladder stones - No trabeculation  Retroflexion shows 1cm intravesical prostatic protrusion   Post-Procedure: - Patient tolerated the procedure well  Assessment/ Plan:   Return in about 6 months (around 02/22/2021) for cystoscopy.  Nicolette Bang, MD

## 2020-08-26 ENCOUNTER — Ambulatory Visit (INDEPENDENT_AMBULATORY_CARE_PROVIDER_SITE_OTHER): Payer: Medicare (Managed Care) | Admitting: Neurology

## 2020-08-29 ENCOUNTER — Encounter (INDEPENDENT_AMBULATORY_CARE_PROVIDER_SITE_OTHER): Payer: Self-pay | Admitting: Neurology

## 2020-08-30 ENCOUNTER — Encounter (INDEPENDENT_AMBULATORY_CARE_PROVIDER_SITE_OTHER): Payer: Self-pay | Admitting: Neurology

## 2020-08-30 ENCOUNTER — Other Ambulatory Visit (INDEPENDENT_AMBULATORY_CARE_PROVIDER_SITE_OTHER): Payer: Self-pay | Admitting: Neurology

## 2020-08-30 DIAGNOSIS — G20 Parkinson's disease: Secondary | ICD-10-CM

## 2020-08-30 MED ORDER — RYTARY 48.75-195 MG PO CPCR
1.00 | ORAL_CAPSULE | Freq: Four times a day (QID) | ORAL | 3 refills | Status: AC
Start: 2020-08-30 — End: ?

## 2020-09-22 ENCOUNTER — Telehealth: Payer: Self-pay

## 2020-09-22 NOTE — Telephone Encounter (Signed)
Daughter called today- patient is out of town in [place] and family feels he has a UTI and is requesting Dr. Alyson Ingles to call in an antibiotic for patient.   I spoke with Dr. Alyson Ingles who recommends patient is to be seen at the location he is in at an Urgent Care for treatment if needed. Daughter voiced understanding.

## 2020-11-03 ENCOUNTER — Other Ambulatory Visit: Payer: Self-pay

## 2020-11-06 ENCOUNTER — Other Ambulatory Visit (INDEPENDENT_AMBULATORY_CARE_PROVIDER_SITE_OTHER): Payer: Self-pay | Admitting: Neurology

## 2020-11-06 DIAGNOSIS — G20 Parkinson's disease: Secondary | ICD-10-CM

## 2020-11-06 DIAGNOSIS — F482 Pseudobulbar affect: Secondary | ICD-10-CM

## 2020-11-08 ENCOUNTER — Other Ambulatory Visit: Payer: Self-pay

## 2020-11-08 ENCOUNTER — Ambulatory Visit (INDEPENDENT_AMBULATORY_CARE_PROVIDER_SITE_OTHER): Payer: Medicare HMO | Admitting: Urology

## 2020-11-08 ENCOUNTER — Encounter: Payer: Self-pay | Admitting: Urology

## 2020-11-08 ENCOUNTER — Telehealth (INDEPENDENT_AMBULATORY_CARE_PROVIDER_SITE_OTHER): Payer: Self-pay

## 2020-11-08 DIAGNOSIS — N3281 Overactive bladder: Secondary | ICD-10-CM | POA: Diagnosis not present

## 2020-11-08 DIAGNOSIS — N3001 Acute cystitis with hematuria: Secondary | ICD-10-CM | POA: Insufficient documentation

## 2020-11-08 LAB — MICROSCOPIC EXAMINATION
Epithelial Cells (non renal): NONE SEEN /hpf (ref 0–10)
Renal Epithel, UA: NONE SEEN /hpf
WBC, UA: >30 /hpf — AB (ref 0–5)

## 2020-11-08 LAB — URINALYSIS, ROUTINE W REFLEX MICROSCOPIC
Bilirubin, UA: NEGATIVE
Nitrite, UA: POSITIVE — AB
Specific Gravity, UA: 1.025 (ref 1.005–1.030)
Urobilinogen, Ur: 0.2 mg/dL (ref 0.2–1.0)
pH, UA: 5 (ref 5.0–7.5)

## 2020-11-08 LAB — BLADDER SCAN AMB NON-IMAGING: Scan Result: 58

## 2020-11-08 MED ORDER — NITROFURANTOIN MONOHYD MACRO 100 MG PO CAPS
100.0000 mg | ORAL_CAPSULE | Freq: Two times a day (BID) | ORAL | 0 refills | Status: DC
Start: 2020-11-08 — End: 2021-01-05

## 2020-11-08 NOTE — Telephone Encounter (Signed)
Received request via in basket      NUEDEXTA 20-10 MG CAPSULE  60/30      PA on file through 11/26/2020    Copay $0  Fills with [facility]

## 2020-11-08 NOTE — Progress Notes (Signed)
11/08/2020 2:25 PM   Mike Nixon [birth year] [id number]  Referring provider: [name], MD [place],  [place] [address]  Recurrent UTI  HPI: Mike Nixon is a 79yo here for evaluation of recurrent UTI. Starting the beginning of November he developed worsening urinary urgency, frequency, dysuria and gross hematuria. He was seen in [place] and started on amoxicillin. He was then seen in [place] and placed on omnicef which has failed to improve his LUTS. UA today is concerning for infections.    PMH: Past Medical History:  Diagnosis Date  . Anticoagulant long-term use    PLAVIX  . Benign localized prostatic hyperplasia with lower urinary tract symptoms (LUTS)   . Bladder tumor   . Borderline diabetes    watches diet  . ED (erectile dysfunction) of organic origin   . Gait disturbance, post-stroke    uses wheelchair long distance  . Glaucoma, right eye   . History of bleeding peptic ulcer    03/ 1979  s/p  surgery  . History of concussion    1987 no loc-- no residual  . History of CVA with residual deficit    05/ 2016 CYPTOGENIC CVA --- RESIDUAL GAIT DISTURBANCE AND SIALORRHEA ACCESS (DROOLING)  . Hyperlipidemia   . Hypertension   . Mild atherosclerosis of both carotid arteries    per duplex from outside facitlity 04-08-2015 less then 50% stenosis bilateral ICA  . Parkinson's disease [place]) neurologist -- Mike Nixon ([facility], [facility])   dx 2015  . PBA (pseudobulbar affect)    laughter-- residual from cva 05/ 2016  . PFO (patent foramen ovale)    per TEE 08-18-2016  . Speech impairment    post-stroke  . Status post placement of implantable loop recorder 08/18/2016   placed by Mike Nixon  . Wears glasses     Surgical History: Past Surgical History:  Procedure Laterality Date  . APPENDECTOMY  1978  . CATARACT EXTRACTION W/ INTRAOCULAR LENS IMPLANT Right 2014 approx.  . CYSTOSCOPY W/ URETERAL STENT PLACEMENT Bilateral  05/07/2017   Procedure: CYSTOSCOPY WITH RETROGRADE PYELOGRAM/URETERAL;  Surgeon: Mike Gustin, MD;  Location: [facility];  Service: Urology;  Laterality: Bilateral;  . EP IMPLANTABLE DEVICE N/A 08/18/2016   Procedure: Loop Recorder Insertion;  Surgeon: Mike Grayer, MD;  Location: [facility];  Service: Cardiovascular;  Laterality: N/A;  . REMOVAL NODULES BILATERAL FLANK  2008 approx.   per pt benign fatty tissue  . REPAIR OF PERFORATED ULCER  01/1978   bleeding peptic ulcer  . TEE WITHOUT CARDIOVERSION N/A 08/18/2016   Procedure: TRANSESOPHAGEAL ECHOCARDIOGRAM (TEE);  Surgeon: Mike Margarita, MD;  Location: [facility];  Service: Cardiovascular;  Laterality: N/A; no evidence thrombus;  mobile atrial septum w/ positive bubble study consistent with PFO/ trivial AR, mild TR  . TRANSTHORACIC ECHOCARDIOGRAM  04/09/2015   ef 81-44%, grade 1 diastolic dysfunction/  mild AV sclerosis without stenosis/  mild TR,  trivial Mike and PR  . TRANSURETHRAL RESECTION OF BLADDER TUMOR N/A 05/07/2017   Procedure: TRANSURETHRAL RESECTION OF BLADDER TUMOR (TURBT);  Surgeon: Mike Gustin, MD;  Location: [facility];  Service: Urology;  Laterality: N/A;  . TRANSURETHRAL RESECTION OF BLADDER TUMOR N/A 06/18/2017   Procedure: TRANSURETHRAL RESECTION OF BLADDER TUMOR (TURBT);  Surgeon: Mike Gustin, MD;  Location: [facility];  Service: Urology;  Laterality: N/A;  . TRANSURETHRAL RESECTION OF BLADDER TUMOR N/A 09/11/2018   Procedure: TRANSURETHRAL  RESECTION OF BLADDER TUMOR (TURBT);  Surgeon: Mike Gustin, MD;  Location: [facility];  Service: Urology;  Laterality: N/A;  . TRANSURETHRAL RESECTION OF PROSTATE  08-20-2013   at [facility] Medications:  Allergies as of 11/08/2020      Reactions   Benazepril Cough   Doxycycline Other (See Comments)   Unknown   Levofloxacin Hives, Swelling   Simvastatin Other (See Comments)   Pt does not remember what the reaction  was. ?muscle aches   Tamsulosin Hives, Swelling   Tramadol Hcl Hives, Swelling   Sulfa Antibiotics Hives, Swelling, Rash, Other (See Comments)   Severe rash      Medication List       Accurate as of November 08, 2020  2:25 PM. If you have any questions, ask your nurse or doctor.        acetaminophen 500 MG tablet Commonly known as: TYLENOL Take 1,000 mg by mouth every 6 (six) hours as needed for mild pain or headache.   atorvastatin 40 MG tablet Commonly known as: LIPITOR Take 40 mg by mouth every evening.   Carbidopa-Levodopa ER 36.25-145 MG Cpcr Take 3 capsules by mouth 4 (four) times daily.   Rytary 48.75-195 MG Cpcr Generic drug: Carbidopa-Levodopa ER Take by mouth.   chlorhexidine 0.12 % solution Commonly known as: PERIDEX 15 mLs by Mouth Rinse route 2 (two) times daily.   ciprofloxacin 500 MG tablet Commonly known as: CIPRO Take 500 mg by mouth 2 (two) times daily.   clopidogrel 75 MG tablet Commonly known as: PLAVIX Take 1 tablet (75 mg total) by mouth daily.   furosemide 20 MG tablet Commonly known as: LASIX Take 1 tablet (20 mg total) by mouth as needed. What changed:   when to take this  reasons to take this   hydrochlorothiazide 25 MG tablet Commonly known as: HYDRODIURIL Take 25 mg by mouth daily.   HYDROcodone-acetaminophen 5-325 MG tablet Commonly known as: NORCO/VICODIN Take by mouth.   meclizine 25 MG tablet Commonly known as: ANTIVERT Take 25 mg by mouth 3 (three) times daily as needed for dizziness.   metroNIDAZOLE 500 MG tablet Commonly known as: FLAGYL Take 500 mg by mouth 2 (two) times daily.   Nourianz 40 MG Tabs Generic drug: Istradefylline Take 1 tablet by mouth daily.   Istradefylline 20 MG Tabs Take by mouth.   Nuedexta 20-10 MG capsule Generic drug: Dextromethorphan-quiNIDine Take 1 capsule by mouth 2 (two) times daily.   pantoprazole 40 MG tablet Commonly known as: PROTONIX Take 1 tablet by mouth daily.    polyethylene glycol powder 17 GM/SCOOP powder Commonly known as: GLYCOLAX/MIRALAX Take 17 g by mouth daily.   sodium phosphate 7-19 GM/118ML Enem Place 133 mLs (1 enema total) rectally daily as needed for severe constipation.   Toviaz 8 MG Tb24 tablet Generic drug: fesoterodine Take 1 tablet (8 mg total) by mouth daily.   Travoprost (BAK Free) 0.004 % Soln ophthalmic solution Commonly known as: TRAVATAN Place 1 drop into both eyes at bedtime.       Allergies:  Allergies  Allergen Reactions  . Benazepril Cough  . Doxycycline Other (See Comments)    Unknown  . Levofloxacin Hives and Swelling  . Simvastatin Other (See Comments)    Pt does not remember what the reaction was. ?muscle aches  . Tamsulosin Hives and Swelling  . Tramadol Hcl Hives and Swelling  . Sulfa Antibiotics Hives, Swelling, Rash and Other (See Comments)    Severe rash  Family History: Family History  Problem Relation Age of Onset  . Stroke Sister     Social History:  reports that he has never smoked. He has never used smokeless tobacco. He reports that he does not drink alcohol and does not use drugs.  ROS: All other review of systems were reviewed and are negative except what is noted above in HPI  Physical Exam: There were no vitals taken for this visit.  Constitutional:  Alert and oriented, No acute distress. HEENT: [place] AT, moist mucus membranes.  Trachea midline, no masses. Cardiovascular: No clubbing, cyanosis, or edema. Respiratory: Normal respiratory effort, no increased work of breathing. GI: Abdomen is soft, nontender, nondistended, no abdominal masses GU: No CVA tenderness.  Lymph: No cervical or inguinal lymphadenopathy. Skin: No rashes, bruises or suspicious lesions. Neurologic: Grossly intact, no focal deficits, moving all 4 extremities. Psychiatric: Normal mood and affect.  Laboratory Data: Lab Results  Component Value Date   WBC 5.7 05/14/2020   HGB 14.3 05/14/2020   HCT  46.1 05/14/2020   MCV 82.9 05/14/2020   PLT 232 05/14/2020    Lab Results  Component Value Date   CREATININE 1.52 (H) 05/14/2020    No results found for: PSA  No results found for: TESTOSTERONE  Lab Results  Component Value Date   HGBA1C 6.4 (H) 06/12/2017    Urinalysis    Component Value Date/Time   COLORURINE YELLOW 05/14/2020 1440   APPEARANCEUR Clear 08/25/2020 1347   LABSPEC 1.016 05/14/2020 1440   PHURINE 5.0 05/14/2020 1440   GLUCOSEU Negative 08/25/2020 1347   HGBUR NEGATIVE 05/14/2020 1440   BILIRUBINUR Negative 08/25/2020 1347   KETONESUR NEGATIVE 05/14/2020 1440   PROTEINUR Trace (A) 08/25/2020 1347   PROTEINUR NEGATIVE 05/14/2020 1440   UROBILINOGEN negative (A) 03/24/2020 1519   NITRITE Negative 08/25/2020 1347   NITRITE NEGATIVE 05/14/2020 1440   LEUKOCYTESUR Negative 08/25/2020 1347   LEUKOCYTESUR TRACE (A) 05/14/2020 1440    Lab Results  Component Value Date   LABMICR Comment 08/25/2020   BACTERIA NONE SEEN 05/14/2020    Pertinent Imaging:  No results found for this or any previous visit.  No results found for this or any previous visit.  No results found for this or any previous visit.  No results found for this or any previous visit.  No results found for this or any previous visit.  No results found for this or any previous visit.  No results found for this or any previous visit.  No results found for this or any previous visit.   Assessment & Plan:    1. Acute cystitis with hematuria Urine for culture, will call with results. Macrobid 100mg  BID for 7 days. He will drop off a urine in 1 week to ensure he has not residual infection. - Urine Culture   No follow-ups on file.  Nicolette Bang, MD  [facility] [place]

## 2020-11-08 NOTE — Progress Notes (Signed)
Urological Symptom Review  Patient is experiencing the following symptoms: Frequent urination Leakage of urine Urinary tract infection Erection problems (male only)   Review of Systems  Gastrointestinal (upper)  : Negative for upper GI symptoms  Gastrointestinal (lower) : Negative for lower GI symptoms  Constitutional : Fatigue  Skin: Negative for skin symptoms  Eyes: Negative for eye symptoms  Ear/Nose/Throat : Negative for Ear/Nose/Throat symptoms  Hematologic/Lymphatic: Negative for Hematologic/Lymphatic symptoms  Cardiovascular : Negative for cardiovascular symptoms  Respiratory : Shortness of breath  Endocrine: Excessive thirst  Musculoskeletal: Negative for musculoskeletal symptoms  Neurological: Negative for neurological symptoms  Psychologic: Negative for psychiatric symptoms

## 2020-11-10 LAB — URINE CULTURE: Organism ID, Bacteria: NO GROWTH

## 2020-11-12 ENCOUNTER — Ambulatory Visit: Payer: Medicare HMO | Admitting: Urology

## 2020-11-15 ENCOUNTER — Other Ambulatory Visit: Payer: Medicare HMO

## 2020-11-15 ENCOUNTER — Other Ambulatory Visit: Payer: Self-pay

## 2020-11-15 DIAGNOSIS — N3281 Overactive bladder: Secondary | ICD-10-CM

## 2020-11-15 LAB — URINALYSIS, ROUTINE W REFLEX MICROSCOPIC
Bilirubin, UA: NEGATIVE
Leukocytes,UA: NEGATIVE
Nitrite, UA: NEGATIVE
Protein,UA: NEGATIVE
RBC, UA: NEGATIVE
Specific Gravity, UA: 1.025 (ref 1.005–1.030)
Urobilinogen, Ur: 0.2 mg/dL (ref 0.2–1.0)
pH, UA: 5 (ref 5.0–7.5)

## 2020-11-17 ENCOUNTER — Telehealth: Payer: Self-pay

## 2020-11-17 NOTE — Telephone Encounter (Signed)
-----   Message from Cleon Gustin, MD sent at 11/15/2020  8:42 AM EST ----- negative ----- Message ----- From: Dorisann Frames, RN Sent: 11/10/2020  11:55 AM EST To: Cleon Gustin, MD  Please review

## 2020-11-17 NOTE — Telephone Encounter (Signed)
Wife notified of results

## 2020-11-21 LAB — CULTURE, URINE COMPREHENSIVE

## 2020-11-23 ENCOUNTER — Telehealth: Payer: Self-pay

## 2020-11-23 ENCOUNTER — Other Ambulatory Visit: Payer: Self-pay

## 2020-11-23 DIAGNOSIS — N3001 Acute cystitis with hematuria: Secondary | ICD-10-CM

## 2020-11-23 MED ORDER — FOSFOMYCIN TROMETHAMINE 3 G PO PACK: PACK | ORAL | 0 refills | Status: DC

## 2020-11-23 NOTE — Telephone Encounter (Signed)
-----   Message from Malen Gauze, MD sent at 11/23/2020 11:08 AM EST ----- Cipro 250mg  BID for 7 days ----- Message ----- From: , RN Sent: 11/23/2020  10:03 AM EST To: 11/25/2020, MD  Please review

## 2020-11-23 NOTE — Telephone Encounter (Signed)
Pt wife called with results. Pt has allergy to levofloxacin= spoke with Dr. Ronne Binning will not given Cipro- monurol to pharmacy Dr. Ronne Binning gave verbal. Wife notified. Pt can come 1 week after completion to give urine sample. Wife notified.

## 2020-11-24 ENCOUNTER — Other Ambulatory Visit: Payer: Self-pay | Admitting: Urology

## 2020-11-24 ENCOUNTER — Telehealth: Payer: Self-pay

## 2020-11-24 DIAGNOSIS — N390 Urinary tract infection, site not specified: Secondary | ICD-10-CM

## 2020-11-24 MED ORDER — GENTAMICIN SULFATE 40 MG/ML IJ SOLN: 160.0000 mg | Freq: Every day | INTRAMUSCULAR | 0 refills | Status: AC

## 2020-11-24 NOTE — Telephone Encounter (Signed)
Wife called and made aware of new rx sent in and instructions for injection.

## 2020-11-25 ENCOUNTER — Encounter (INDEPENDENT_AMBULATORY_CARE_PROVIDER_SITE_OTHER): Payer: Self-pay | Admitting: Neurology

## 2020-11-25 ENCOUNTER — Ambulatory Visit (INDEPENDENT_AMBULATORY_CARE_PROVIDER_SITE_OTHER): Payer: Medicare (Managed Care) | Admitting: Neurology

## 2020-11-25 VITALS — BP 153/84 | HR 78

## 2020-11-25 DIAGNOSIS — I639 Cerebral infarction, unspecified: Secondary | ICD-10-CM

## 2020-11-25 DIAGNOSIS — R270 Ataxia, unspecified: Secondary | ICD-10-CM

## 2020-11-25 DIAGNOSIS — G20 Parkinson's disease: Secondary | ICD-10-CM

## 2020-11-25 DIAGNOSIS — R471 Dysarthria and anarthria: Secondary | ICD-10-CM

## 2020-11-25 DIAGNOSIS — F482 Pseudobulbar affect: Secondary | ICD-10-CM

## 2020-11-25 MED ORDER — GOCOVRI 137 MG PO CP24
ORAL_CAPSULE | ORAL | 3 refills | Status: DC
Start: 2020-11-25 — End: 2021-02-24

## 2020-11-25 NOTE — Patient Instructions (Signed)
Our plan:     1) Begin a trial of Gocovri, 1 capsule at bedtime. After 1 week, we can change to be 2 capsules at bedtime.    Keep me updated via Mychart and we'll follow up in 3 months.     Today's Visit:      In today's visit I reviewed your medications and records relating your health - prior testing, blood work, reports of other health care providers present in your electronic medical record.     If you have records from non-[facility] doctors please send to [place] or bring to next office visit.     A copy of today's visit will be sent to your referring doctor and/or primary care doctor.    Let me know if there are things we could have done better for your office visit.    Patient satisfaction survey:      If you receive a patient satisfaction survey, I would greatly appreciate it if you would complete it.     We are like a car dealer where we aim for a score of 9 or 10.  If you had an experience that was less than a 9 or 10, please let me know so we can improve. If you had a good experience today, please let us know too.  We value your feedback.     Contact me online:      Patient Portal online - Please sign up for MyChart -- this is the best way to communicate me.  There is a messaging feature which can send messages directly to my inbox.  It is the best way to communicate with me and get test results, medication refills or ask questions.     You can expect to get a response within 24-48 hours during weekdays - if you do not, resend your request and inform us we did not respond. My goal is for every question answered as soon as possible. Average response for a phone call is 3 days due to the volume we receive (which is why MyChart is preferred).    MyChart should not be used if you are having a medical emergency -- call 911 in that case.     Coupons for medication:      If you would like to see if samples or vouchers available for your medicines please consider checking online.  Most medicines have web sites where  you can print coupons or vouchers for you co-pay.     For example: AutomobileBuzz.is,  DSLRemote.se, Namendaxr.com, http://www.walker-hill.info/, Rytary.com, Vimpat.com    Thank you for trusting me with your health.      Take care,      Waller Marcussen "Johnston Ebbs, MD  Director, Movement Disorders Specialist  [facility]  [facility]    [email or web address]      [address]  [address]  [place] [address]    [place], [place] [address]  T [phone]   F [phone]   T [phone]   F [phone]

## 2020-11-25 NOTE — Progress Notes (Signed)
Subjective:     79 y/o M with hx HTN, HLD, borderline DM, glaucoma, prior CVA, presents to Movement Disorders clinic for f/u of possible Parkinson's Disease.     Increased Rytary and caused orolingual dyskinesia. Went back down and still protruding. Currently on 3 capsules of 145mg  4x daily.    Walking some but poor, using a wheelchair more. Completed PT. Legs continue to stutter when walking. No LH/D reported. Does report more headaches. Has had 3 UTIs since last visit. No hallucinations or delusions.     Still likes to stay up at night and sleep during the day, but not as often. Started melatonin, and when he takes it, it does help. On Nuedexta now, which has resolved his crying episodes. Tolerating with no issue.     History retained from initial encounter:     History is mainly from family. He was diagnosed with Parkinson's Disease in 2015, with initial symptoms being changes to his gait.  He was walking slower and his movements were slowed overall.  They report also noting tremors in the legs and hands, unclear laterality. He noticed the tremors throughout the day, but the daughter notices it more with activity, when feeding himself.     The diagnosis of Parkinson's was considered, so he was started on Sinemet, however did not take it because it gave him GI issues. During that year, he had a number of TIAs, but then in 2016 had a completed CVA.  Described a 'clot in the back of his head' by family.  Afterwards, he was having issues with speech and swallowing. Swallow study was normal.     At some point then, the dx of Parkinson's was questioned, but "tests" were completed that confirmed, though unclear what that was.  Was put back on Sinemet.  Once he is on Sinemet, his walking and tightness improves, but they are concerned about progression as he is falling frequently now. No cognitive changes, just difficulty talking.    Weak spells happen in the morning, he tells family that he's weak. Can get to lie  down, but doesn't feel dizzy, woozy or lightheaded.  He feels that he starts to feel weak after the medicine.     Last complete PT in December last year. He has completed SLT which helped.    + uncontrollable laughing and crying noted, depression/anxiety and fatigue.     DATscan reviewed (08/10/2016):    Abnormal image grade 3: Virtually absent uptake bilaterally affecting both putamen and caudate nuclei.  This pattern of activity can be seen with Parkinson's disease or related syndromes.    No family history of Parkinson's Disease or tremor. No history significant head injury or LOC.  No history of neuroleptic or chemical exposure.     Prior history reviewed:    Garnell Phenix was seen today in the movement disorders clinic for neurologic consultation at the request of [name], MD. The consultation is for the evaluation of PD. No records are available to me from his prior neurologist. Therefore, all history was obtained from the patient and wife. He was previously being seen in [place], [place]. He was dx with with PD about 3 years ago and wife states that his first sx was was slow handwriting and slow to eat and slight jaw tremor. He was started on carbidopa/levodopa 10/100 tid and pt doesn't think that it helped. It was taking it at 8am/1pm/8-9pm. Lunchtime is at noon. He takes the last at bedtime. However, he admits that  he has not taken it for months.    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 When she got to him, he was just laying on the floor. They called 911. He was not on ASA at the time. He was put on plavix and lipitor. Speech has been biggest issue since the stroke. States that he never went through speech therapy, even when he was in rehab. He did physical therapy only. R leg remains a bit weak after stroke.    07/18/16 update: The patient is seen today in follow-up. I have reviewed many records from several hospitalizations and also his prior  neurologist records since our last visit. He was first seen by his prior neurologist in [place] on 06/15/2014 with complaints of speech changes and dragging his right leg and trouble buttoning his clothing. On examination, she reports no bradykinesia at that visit, but she reports that he had lip tremor. He was dragging his right leg and his rapid alternating movements were normal. She did an MRI of the brain just following that visit and this was done on 07/06/2014. There was a small acute right basal ganglia infarct. He followed up with her just after that in August, 2015 and was started on levodopa, 10/100 3 times per day, as she felt that his infarct was acute, whereas his symptoms were for 6 months and he had right leg symptoms and right sided infarct. 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 In September, 2015 the notes said that the patient noted significant improvement on aspirin and a statin. He was then hospitalized in May, 2016 and she followed up with him after that and said that there was just mild slurring of speech. On 07/19/2015 she did have him wearing an event monitor for one month that was negative. It does appear that he had been off of levodopa ever since his infarct in May, 2016. I was able to get his hospital records from [facility] from his infarction. He was admitted on 04/08/2015, but they reported left-sided weakness and dizziness. He had an echocardiogram on 04/09/2015 with an ejection fraction of 60-65%. An EEG was normal. An MRA of the neck was normal. An MRI of the brain stated that DWI appears to demonstrate multiple punctate areas of high signal involving the cerebellar hemispheres and cerebellar vermis. I did have the opportunity to review these images once they became available, and DWI was in fact positive in both cerebellar hemispheres and amongst the vermis. His carotid ultrasound was negative. There were minimal notes regarding his speech but nothing  stated that it was particularly slurred. He was then recently hospitalized once again more on 07/10/2016 and I again reviewed those records. He was hospitalized for inability to swallow. Pt states that swallowing issues are with liquids and not solids because he was trying to take too much in the mouth at once. An MRI without gadolinium was done. I did not get those images. There was reported to be a few old cerebellar infarctions. There is reported to be an old infarction in the left frontal region. DWI was negative. It was concluded that his swallowing issues were from Parkinsons disease and his refusal to take levodopa. He did have a barium swallow that was reported to show mild oropharyngeal dysphagia, but no dietary changes were made, with the exceptions of to take small bites and administer meds one at a time. He ended up  having a very detailed EMG at our office following these hospitalizations to rule out motor neuron disease and there was no evidence of this.    09/08/16 update: Pt returns accompanied by his wife who supplements the history. Since our last visit the patient had a DaT scan in 07/2016 and it was abnormal demonstrating decreased uptake, being virtually absent in the bilateral caudate and the putamen. Wife does state that when he was last hospitalized, he had been given levodopa and it helped but was d/c in the hospital. He also saw Dr. Johney Frame and had a TEE on 08/18/16 and that was normal with the exception of evidence of a PFO. He had a LE doppler on 08/18/16 and that was normal. He had a loop recorder implanted on 08/18/16 and as of 08/28/16 there were no episodes of atrial fibrillation.     The following portions of the patient's history were reviewed and updated as appropriate: allergies, current medications, past medical history, past social history, past surgical history and problem list.    Review of Systems  + vocal changes, gait disorder.  No recent illness. Denies fever, chills, cough,  sinus pain, eye pain, eye redness, ear pain, rhinorrhea, sore throat, chest pain, SOB, wheezing, abd pain, Nausea, Vomiting, diarrhea, constipation, dysuria, or rashes.  All other systems reviewed and are negative except as previously noted in the HPI.    Objective:     Height 82ft7, Weight 160lb  Constitutional: NAD   Eyes: Clear conjunctiva  Musculoskeletal: stooped posture, normal muscle bulk.  Integumentary: No abnormal rash noted  Psych: Flat affect, decreased blink rate. No more emotionality noted.     Neurological:   Language: Dysarthric. Difficult to understand.  Improved though.     Cranial Nerves:  III, IV, VI: Mild limitation to vertical gaze, No nystagmus, no palsies, no ptosis  V: Intact to LT V1-V3 distribution bilaterally.   VI: Symmetrical face and expression.   VIII: Hearing intact to finger rub bilaterally.     Motor Exam: R hand/UE weakness from prior CVA.      Sensation: Sensation reported intact to LT grossly through symmetrically.     Cerebellar:   Finger to Nose: intact bilaterally with mild terminal dysmetria on FTN.    Reflexes: 1+ symmetric.     Station/ Gait: Wheelchair bound today.     Fine motor use of both hands is slowed to RAM, finger tapping and open/close, R>L but more fluid with no freezing.  Foot tapping also reduced in this laterality.     Assessment:     79 y/o M with hx HTN, HLD, borderline DM, glaucoma, prior CVA, presents to Movement Disorders clinic for f/u of Parkinson's Disease. 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 It is unclear what symptoms are CVA related and which are PD related, though RUE issues appear now more CVA in origin.      Continues to progress, with more orolingual dyskinesia than before, and his speech is also degrading.  Plan will be to add Gocovri QHS, starting with 1 capsule QHS then going to 2 capsules if  needed. This will hopefully help with the dyskinesia/tongue movement as well as off episodes, if related to PD. Discussed dosing, potential side effects including edema, impulse control disorder, sleep attacks, nausea, etc. Has tried amantadine IR in the past with limited response.     Will  send back to PT at f/u as well, though in the interim they will continue to address his ongoing UTI, as this is also likely contributing to his recent decompensation.     Sleep is an issue still, with day/night reversal. Suggested melatonin nightly and if not better, can add a low dose of clonazepam.     Plan:     1) Parkinson's Disease -   - Continue Rytary at 3 capsules 145mg  4x daily, 5 hours apart.  - Continue Nourianz at 20mg  daily. Did not tolerate 40mg  daily.   - Cannot have MAO-B inhibitors d/t Nuedexta use. Did not show response to amantadine IR.  - Begin Gocovri 137mg  QHS; can go to 2 capsules QHS after 1 week if needed.     2) Gait disorder - multifactorial; PD, comorbid medical issues, prior CVA.   - Restart PT/OT. HOME rehab services of medical necessity d/t inability to travel safely, homebound status.   - Referred to home health for services.  - Discussed fall risk at length.     3) Dysarthria -   - Restart SLT.    4) Prior CVA - continue Plavix and statin for secondary stroke prevention.  - screening as above.     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     RTC in 3 mo in person. Patient can follow up sooner if needed. In the meantime, patient will contact the office with any questions or concerns.     -----------------------------------------------------------    Approximately 40 minutes were spent on the day of service including face-to-face time with patient, coordinating care, record review and documentation.    Emmanuelle Coxe "Johnston Ebbs, MD  Director, Movement Disorders Specialist  [facility]  [facility]    [address] [place]  [address]  [place] [address]    [place], [place] [address]  T [phone]   F [phone]   T [phone]   F [phone]     [email or web address]

## 2020-11-29 ENCOUNTER — Telehealth: Payer: Self-pay

## 2020-11-29 NOTE — Telephone Encounter (Signed)
Called pts wife and told her I had texted Dr. Ronne Binning and he said to give the next dose of Fosfomycin with food. Asked about 3rd packet. Told her we would let her know when we heard back. Pts wife expressed understanding.

## 2020-11-29 NOTE — Telephone Encounter (Signed)
Called pts wife and relayed that Dr. Ronne Binning said pt could take 3rd packet of Fosfomycin if pt still had urinary symptoms but not if he had side effects after 2nd dose. Pts wife expressed understanding.

## 2020-11-30 ENCOUNTER — Telehealth: Payer: Self-pay | Admitting: Internal Medicine

## 2020-11-30 NOTE — Telephone Encounter (Signed)
New Message:       Pt's wife called and said pt would like to have his Loop Recorder removed please.

## 2020-11-30 NOTE — Telephone Encounter (Signed)
Will forward to scheduler to place patient on Dr Allred's schedule for extraction.

## 2020-12-06 ENCOUNTER — Other Ambulatory Visit: Payer: Self-pay

## 2020-12-06 DIAGNOSIS — N390 Urinary tract infection, site not specified: Secondary | ICD-10-CM

## 2020-12-06 NOTE — Progress Notes (Signed)
Patient will come to the office tomorrow to leave urine sample after completing recent antibiotic to ensure the infection is gone.

## 2020-12-07 ENCOUNTER — Other Ambulatory Visit: Payer: Medicare HMO

## 2020-12-08 ENCOUNTER — Other Ambulatory Visit: Payer: Medicare HMO

## 2020-12-08 ENCOUNTER — Other Ambulatory Visit: Payer: Self-pay

## 2020-12-08 LAB — URINALYSIS, ROUTINE W REFLEX MICROSCOPIC
Bilirubin, UA: NEGATIVE
Leukocytes,UA: NEGATIVE
Nitrite, UA: NEGATIVE
Specific Gravity, UA: 1.02 (ref 1.005–1.030)
Urobilinogen, Ur: 0.2 mg/dL (ref 0.2–1.0)
pH, UA: 5.5 (ref 5.0–7.5)

## 2020-12-08 LAB — MICROSCOPIC EXAMINATION
Bacteria, UA: NONE SEEN
Epithelial Cells (non renal): NONE SEEN /hpf (ref 0–10)
Renal Epithel, UA: NONE SEEN /hpf
WBC, UA: NONE SEEN /hpf (ref 0–5)

## 2020-12-11 LAB — URINE CULTURE

## 2020-12-14 ENCOUNTER — Other Ambulatory Visit: Payer: Self-pay

## 2020-12-14 ENCOUNTER — Telehealth: Payer: Self-pay

## 2020-12-14 DIAGNOSIS — N390 Urinary tract infection, site not specified: Secondary | ICD-10-CM

## 2020-12-14 MED ORDER — CEFUROXIME AXETIL 250 MG PO TABS: 250.0000 mg | ORAL_TABLET | Freq: Two times a day (BID) | ORAL | 0 refills | Status: DC

## 2020-12-14 NOTE — Telephone Encounter (Signed)
-----   Message from Cleon Gustin, MD sent at 12/14/2020  1:26 PM EST ----- Ceftin 250mg  BID for 7 days ----- Message ----- From: Dorisann Frames, RN Sent: 12/13/2020   8:49 AM EST To: Cleon Gustin, MD  Post antibiotic culture

## 2020-12-14 NOTE — Telephone Encounter (Signed)
Wife notified of new prescription sent to pharmacy.   Wife requested appt to discuss recurrent UTI. appt given.

## 2020-12-23 NOTE — Telephone Encounter (Signed)
Appt made to see dr to take care of issues.

## 2020-12-24 IMAGING — CR DG CHEST 1V PORT
1 series · 1 of 1 positions shown · non-contrast
Comparison: 11/25/2018 and earlier.

CLINICAL DATA: Had not acute fever and acute mental status changes.
Current history of bladder cancer.

EXAM:
PORTABLE CHEST 1 VIEW

[portable]
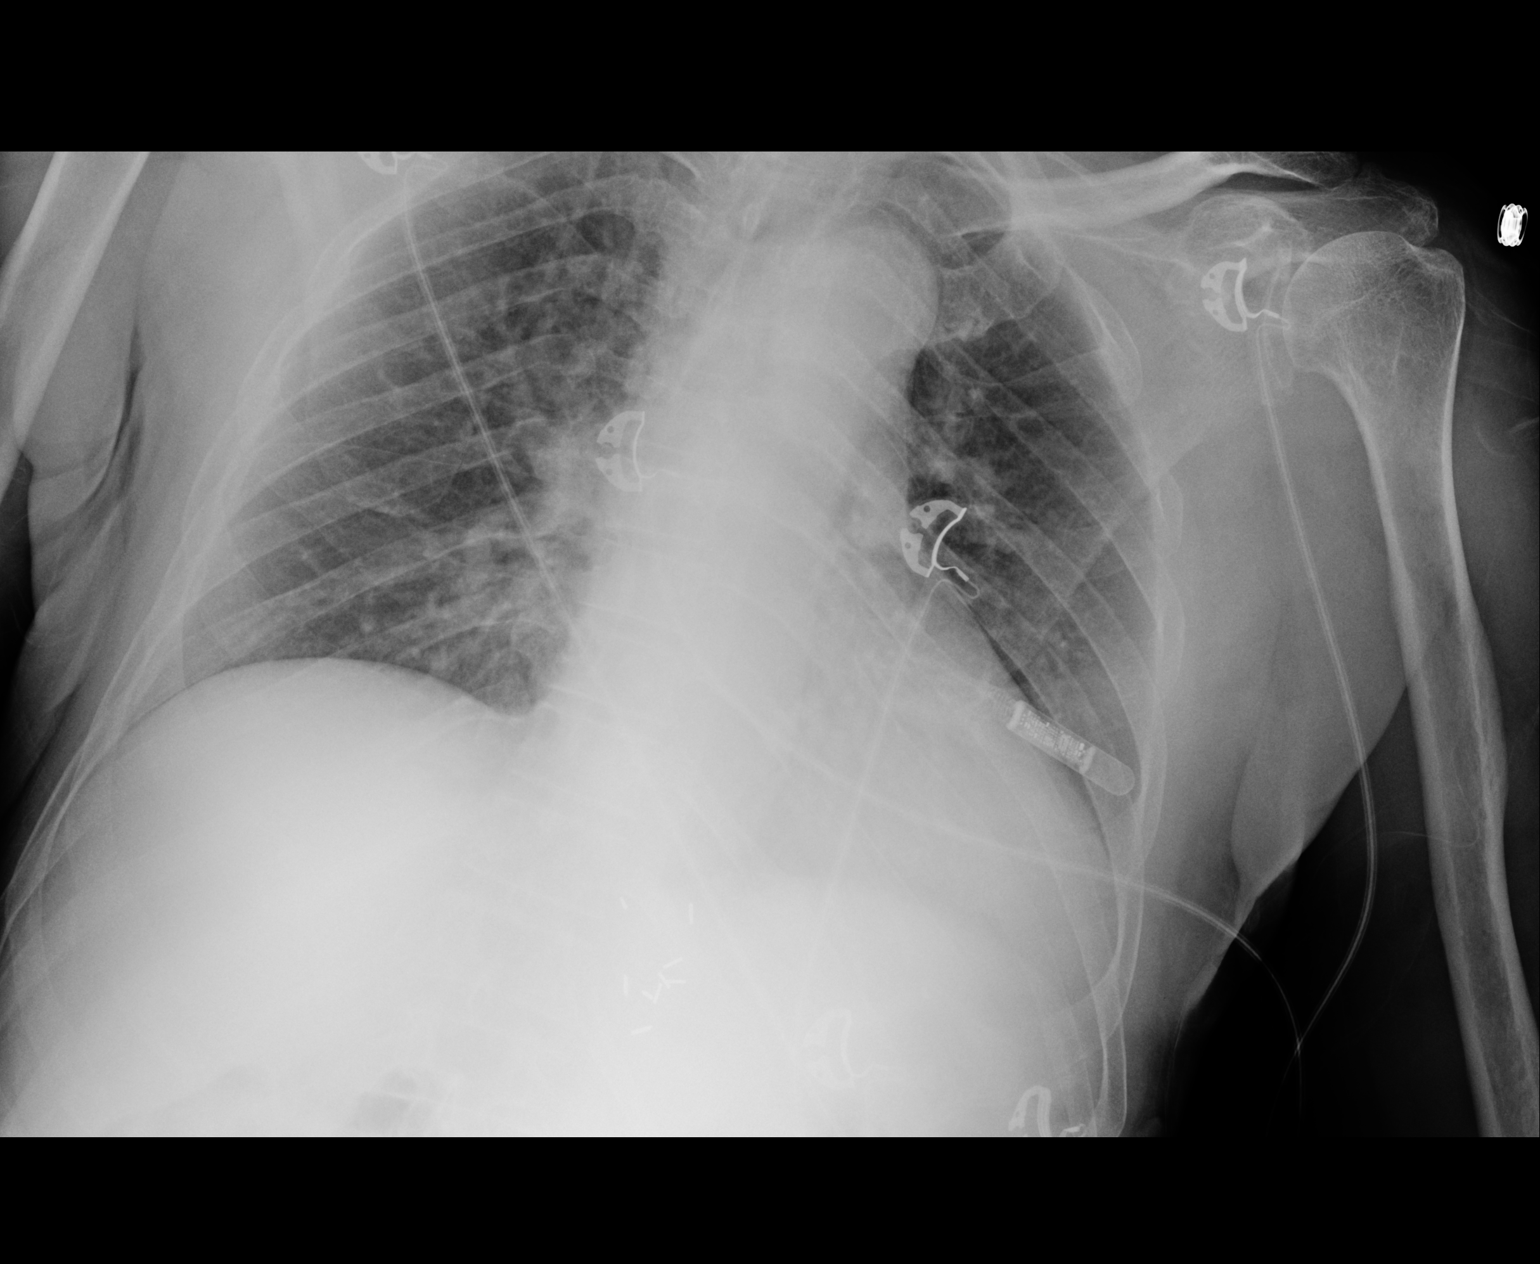

[1 of 1 positions shown; findings below may reference images not displayed]

FINDINGS: Suboptimal inspiration accounts for crowded bronchovascular
markings, especially in the bases, and accentuates the cardiac
silhouette. Taking this into account, cardiac silhouette mildly
enlarged, unchanged. Pulmonary venous hypertension without overt
edema. Lungs clear. No visible pleural effusions. Implantable
cardiac recording device overlying the LEFT side of the chest.
IMPRESSION: Suboptimal inspiration. Stable mild cardiomegaly. No acute
cardiopulmonary disease.

## 2020-12-24 IMAGING — CT CT HEAD W/O CM
3 series · 16 of 47 positions shown, 19 images · non-contrast
Comparison: July 09, 2016

CLINICAL DATA: Bad headache for 3 days.

EXAM:
CT HEAD WITHOUT CONTRAST
TECHNIQUE: Contiguous axial images were obtained from the base of the skull
through the vertex without intravenous contrast.

[Series 2: head trauma wo · axial · 0.45mm/px · z∈[+1364,+1494]mm · 10 of 32 slices shown, 13 images]
[im 3/32  brain]
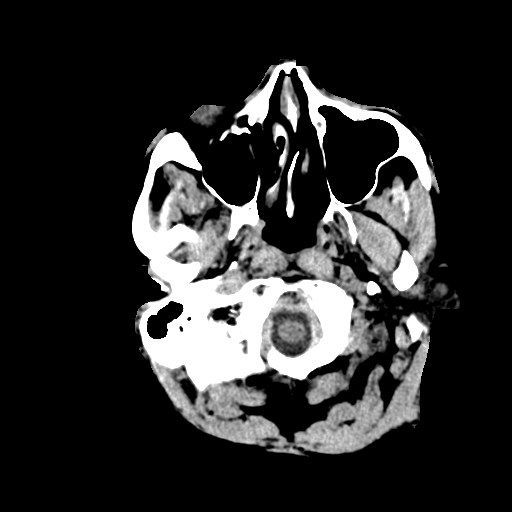
[im 3/32  bone]
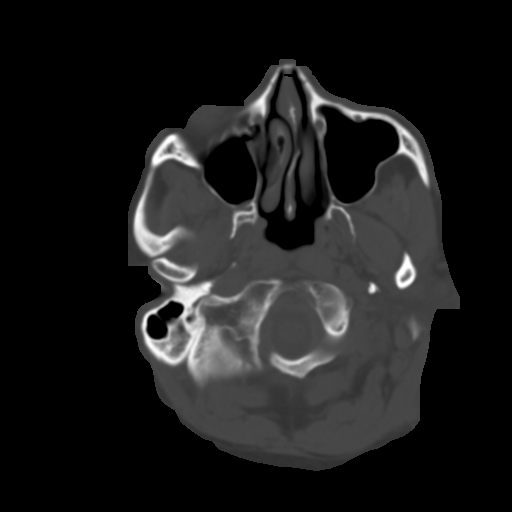
[im 6/32  brain]
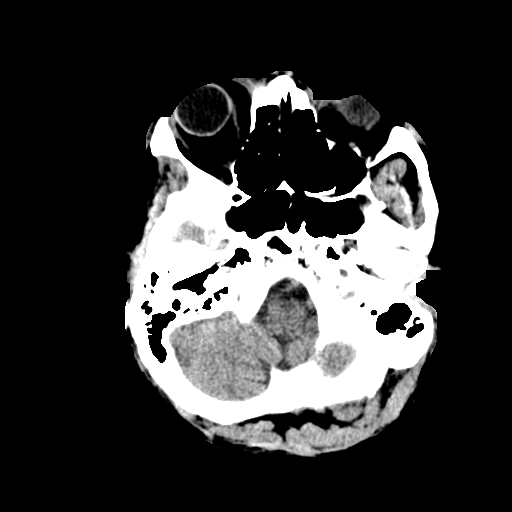
[im 9/32  brain]
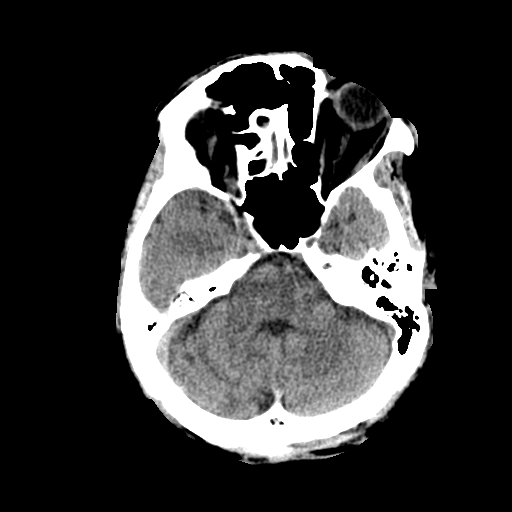
[im 11/32  brain]
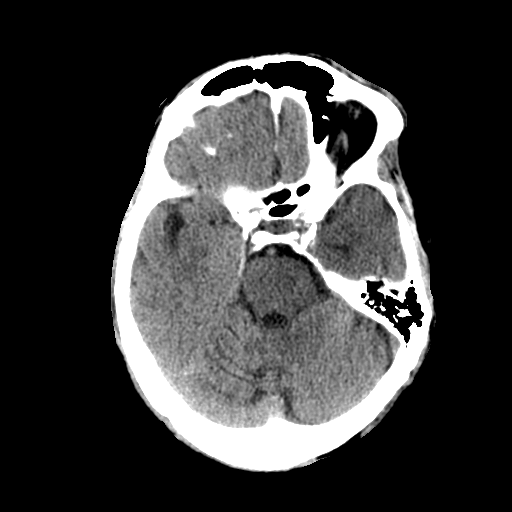
[im 14/32  brain]
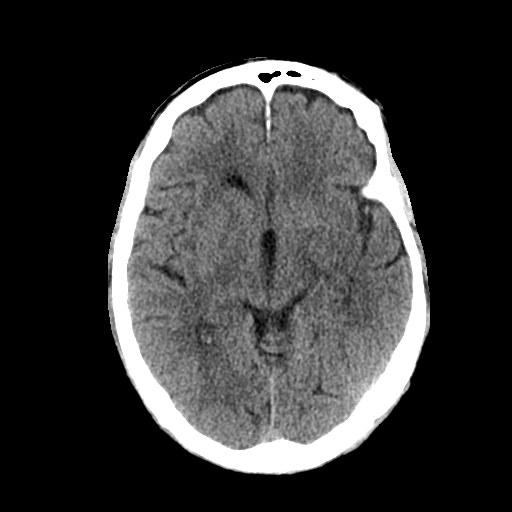
[im 14/32  bone]
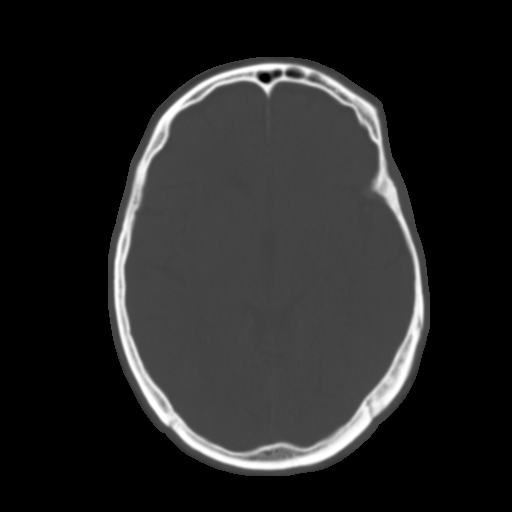
[im 18/32  brain]
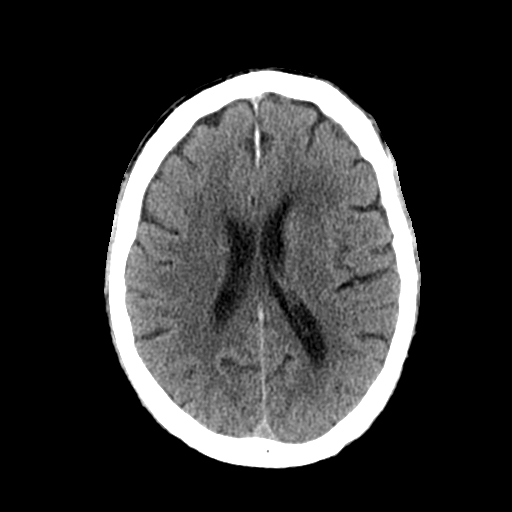
[im 21/32  brain]
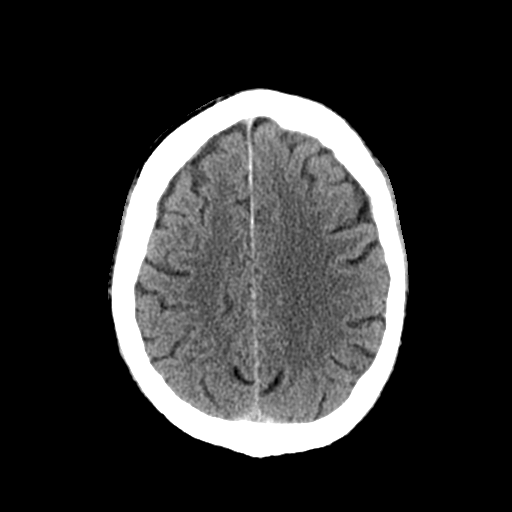
[im 24/32  brain]
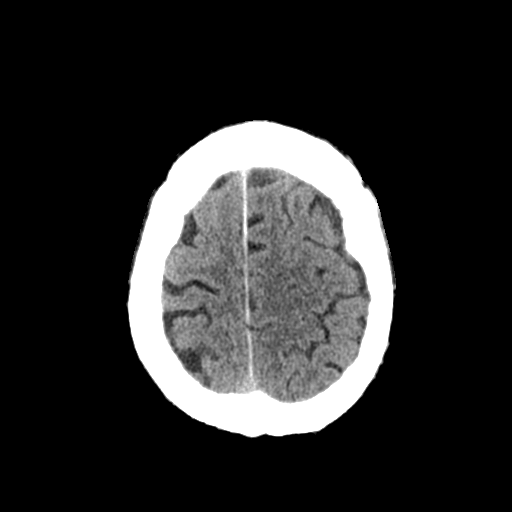
[im 26/32  brain]
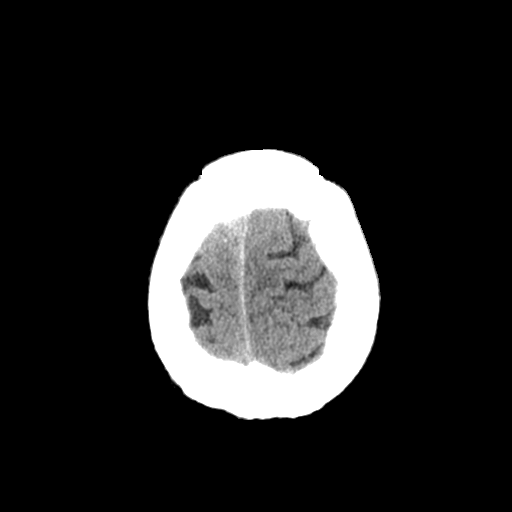
[im 26/32  bone]
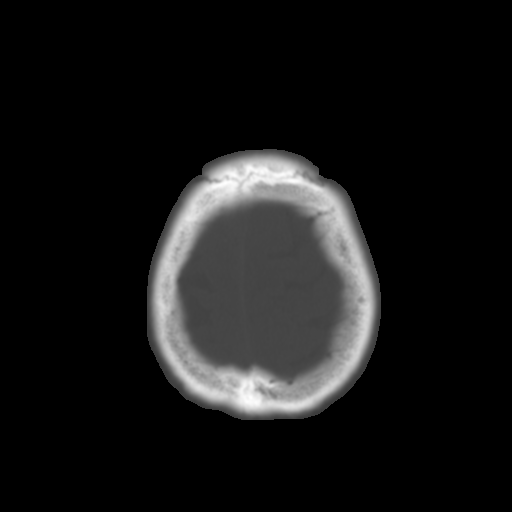
[im 29/32  brain]
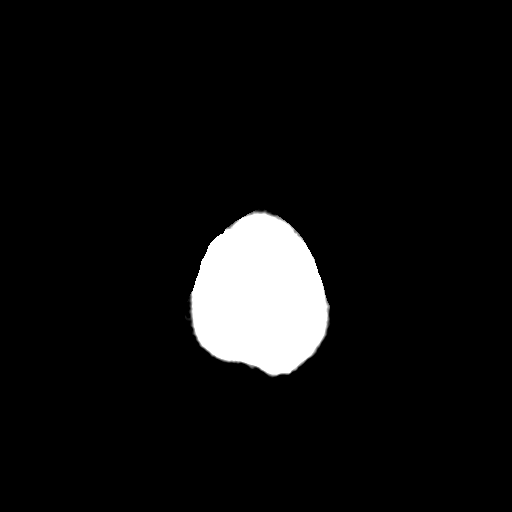

[Series 4: coronal soft tissue · coronal · 0.32mm/px · 3 of 76 slices shown]
[im 26/76  brain]
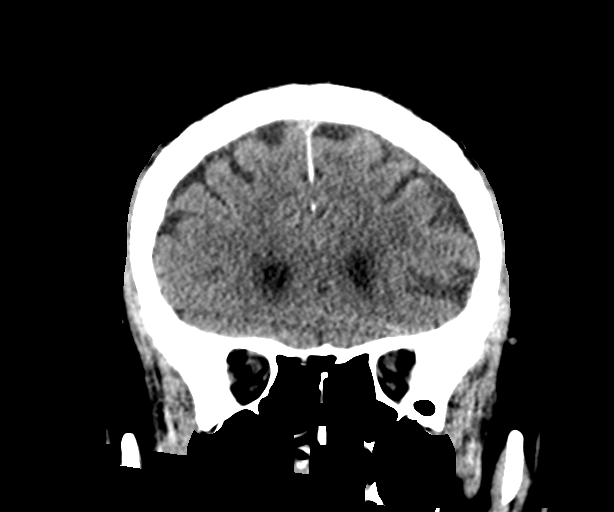
[im 34/76  brain]
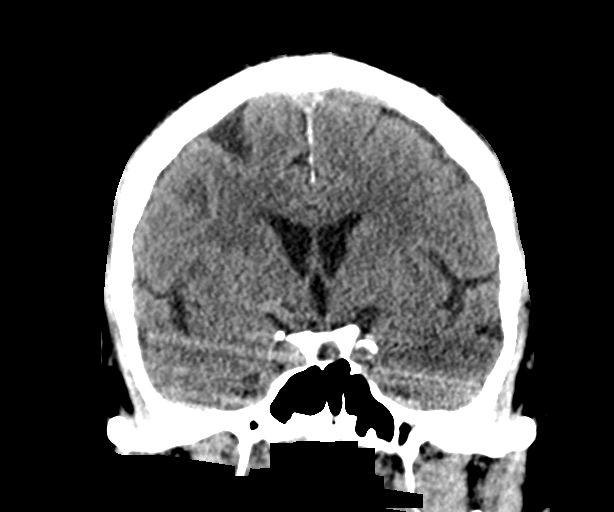
[im 42/76  brain]
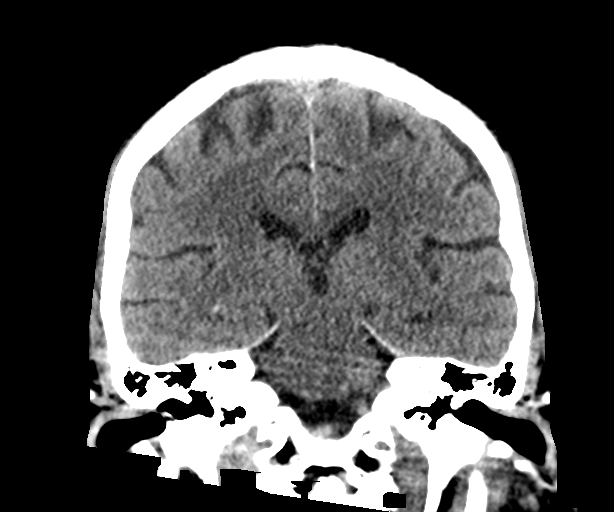

[Series 5: sagittal soft tissue · sagittal · 0.36mm/px · 3 of 60 slices shown]
[im 24/60  brain]
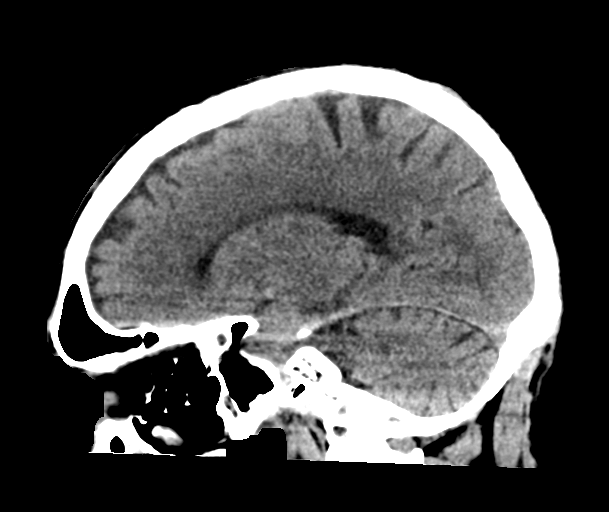
[im 30/60  brain]
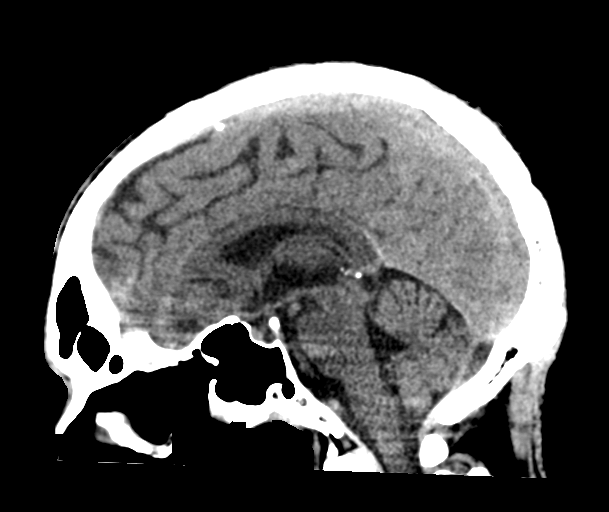
[im 36/60  brain]
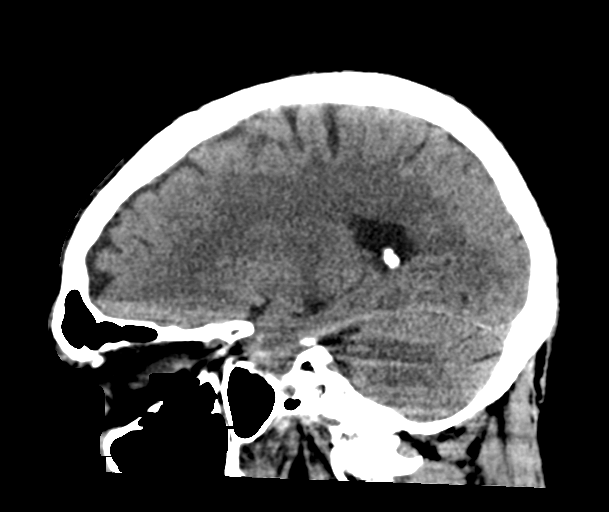

[16 of 47 positions shown; findings below may reference images not displayed]

FINDINGS: Brain: No subdural, epidural, or subarachnoid hemorrhage. Ventricles
and sulci are normal. Cerebellum demonstrates no acute
abnormalities. Brainstem and basal cisterns are normal. No mass
effect or midline shift. No acute cortical ischemia or infarct. Mild
white matter changes.

Vascular: No hyperdense vessel or unexpected calcification.

Skull: Normal. Negative for fracture or focal lesion.

Sinuses/Orbits: No acute finding.

Other: None.
IMPRESSION: 1. No acute intracranial abnormalities to explain the patient's
headache.

## 2020-12-24 IMAGING — DX DG KNEE COMPLETE 4+V*L*
4 series · 4 of 4 positions shown · non-contrast
Comparison: Radiographs 04/17/2018.

CLINICAL DATA: Fall this morning.  Bilateral knee pain.

EXAM:
LEFT KNEE - COMPLETE 4+ VIEW

[knee ap (1 of 3)]
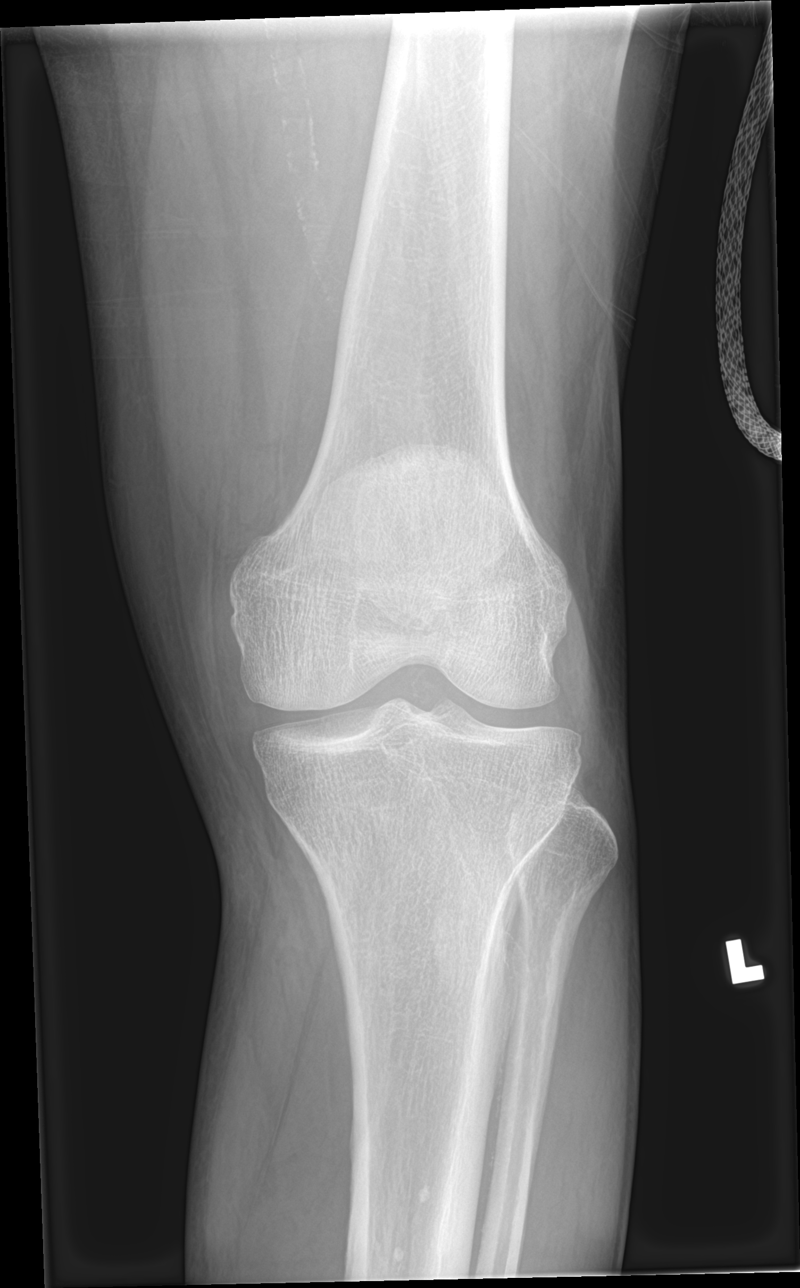

[knee ap (2 of 3)]
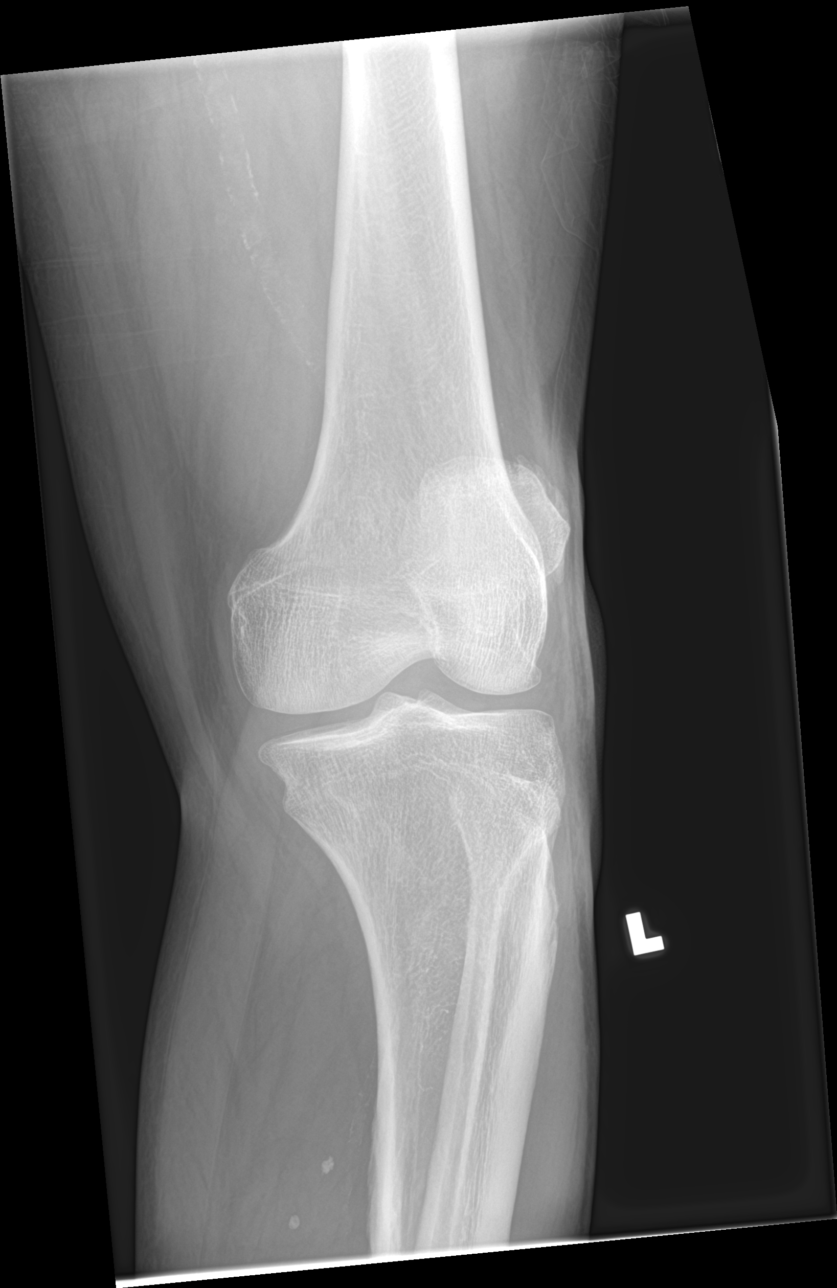

[knee ap (3 of 3)]
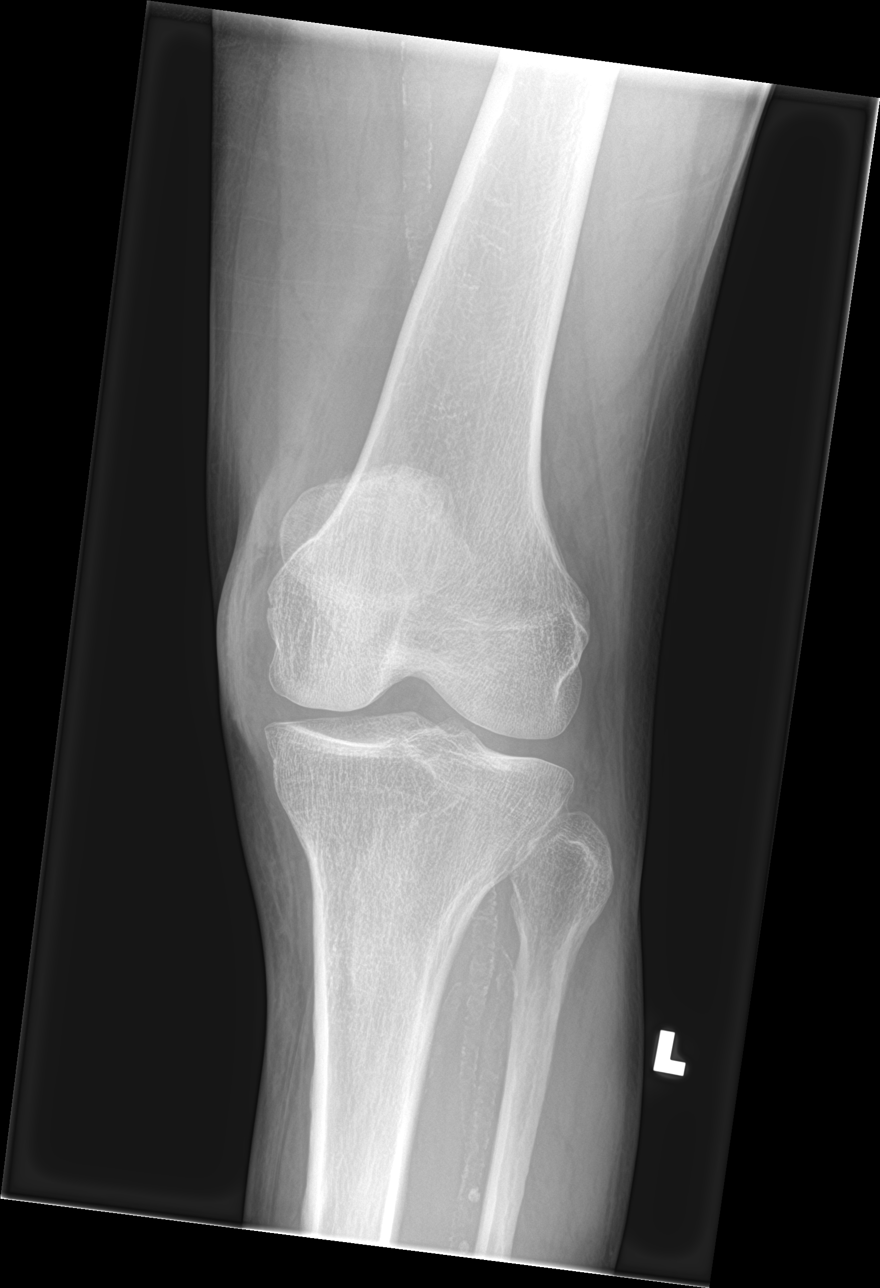

[knee lat]
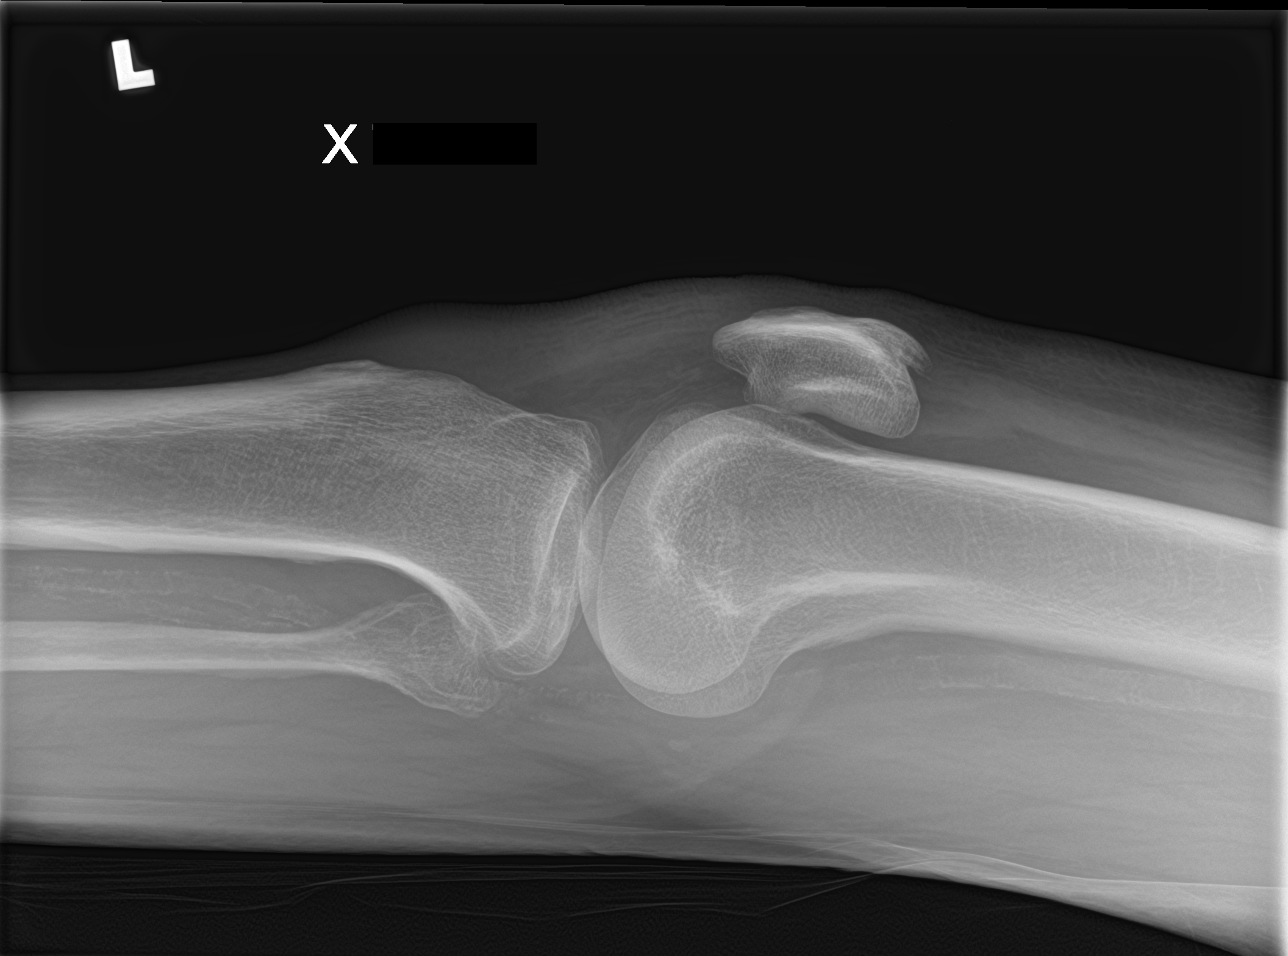

[4 of 4 positions shown; findings below may reference images not displayed]

FINDINGS: The mineralization and alignment are normal. There is no evidence of
acute fracture or dislocation. The joint spaces are preserved. There
is stable spurring at the quadriceps insertion on the patella.
Previously noted pretibial soft tissue swelling has resolved. There
is no foreign body or significant joint effusion. Femoropopliteal
atherosclerosis noted.
IMPRESSION: No acute osseous findings.  Diffuse vascular calcifications.

## 2020-12-27 ENCOUNTER — Encounter: Payer: Self-pay | Admitting: Internal Medicine

## 2020-12-27 ENCOUNTER — Ambulatory Visit (INDEPENDENT_AMBULATORY_CARE_PROVIDER_SITE_OTHER): Payer: Medicare HMO | Admitting: Internal Medicine

## 2020-12-27 ENCOUNTER — Other Ambulatory Visit: Payer: Self-pay

## 2020-12-27 VITALS — BP 116/74 | HR 82 | Ht 67.0 in | Wt 161.6 lb

## 2020-12-27 DIAGNOSIS — E7800 Pure hypercholesterolemia, unspecified: Secondary | ICD-10-CM

## 2020-12-27 DIAGNOSIS — I10 Essential (primary) hypertension: Secondary | ICD-10-CM

## 2020-12-27 DIAGNOSIS — I639 Cerebral infarction, unspecified: Secondary | ICD-10-CM

## 2020-12-27 HISTORY — PX: OTHER SURGICAL HISTORY: SHX169

## 2020-12-27 NOTE — Progress Notes (Signed)
PCP: [name], MD   Primary EP: Dr Rayann Heman  Mike Nixon is a 80 y.o. male who presents today for routine electrophysiology followup.  Since last being seen in our clinic, the patient reports doing very well.  Today, he denies symptoms of palpitations, chest pain, shortness of breath,  lower extremity edema, dizziness, presyncope, or syncope.  The patient is otherwise without complaint today.   Past Medical History:  Diagnosis Date  . Anticoagulant long-term use    PLAVIX  . Benign localized prostatic hyperplasia with lower urinary tract symptoms (LUTS)   . Bladder tumor   . Borderline diabetes    watches diet  . ED (erectile dysfunction) of organic origin   . Gait disturbance, post-stroke    uses wheelchair long distance  . Glaucoma, right eye   . History of bleeding peptic ulcer    03/ 1979  s/p  surgery  . History of concussion    1987 no loc-- no residual  . History of CVA with residual deficit    05/ 2016 CYPTOGENIC CVA --- RESIDUAL GAIT DISTURBANCE AND SIALORRHEA ACCESS (DROOLING)  . Hyperlipidemia   . Hypertension   . Mild atherosclerosis of both carotid arteries    per duplex from outside facitlity 04-08-2015 less then 50% stenosis bilateral ICA  . Parkinson's disease [facility]) neurologist -- dr r. Mitzi Hansen falconer ([facility] [place] [facility], [facility])   dx 2015  . PBA (pseudobulbar affect)    laughter-- residual from cva 05/ 2016  . PFO (patent foramen ovale)    per TEE 08-18-2016  . Speech impairment    post-stroke  . Status post placement of implantable loop recorder 08/18/2016   placed by dr Milinda Sweeney  . Wears glasses    Past Surgical History:  Procedure Laterality Date  . APPENDECTOMY  1978  . CATARACT EXTRACTION W/ INTRAOCULAR LENS IMPLANT Right 2014 approx.  . CYSTOSCOPY W/ URETERAL STENT PLACEMENT Bilateral 05/07/2017   Procedure: CYSTOSCOPY WITH RETROGRADE PYELOGRAM/URETERAL;  Surgeon: Cleon Gustin, MD;  Location: [facility];  Service: Urology;  Laterality: Bilateral;  . EP IMPLANTABLE DEVICE N/A 08/18/2016   Procedure: Loop Recorder Insertion;  Surgeon: Thompson Grayer, MD;  Location: [place] [facility];  Service: Cardiovascular;  Laterality: N/A;  . REMOVAL NODULES BILATERAL FLANK  2008 approx.   per pt benign fatty tissue  . REPAIR OF PERFORATED ULCER  01/1978   bleeding peptic ulcer  . TEE WITHOUT CARDIOVERSION N/A 08/18/2016   Procedure: TRANSESOPHAGEAL ECHOCARDIOGRAM (TEE);  Surgeon: Sueanne Margarita, MD;  Location: [facility] ENDOSCOPY;  Service: Cardiovascular;  Laterality: N/A; no evidence thrombus;  mobile atrial septum w/ positive bubble study consistent with PFO/ trivial AR, mild TR  . TRANSTHORACIC ECHOCARDIOGRAM  04/09/2015   ef [id number], grade 1 diastolic dysfunction/  mild AV sclerosis without stenosis/  mild TR,  trivial MR and PR  . TRANSURETHRAL RESECTION OF BLADDER TUMOR N/A 05/07/2017   Procedure: TRANSURETHRAL RESECTION OF BLADDER TUMOR (TURBT);  Surgeon: Cleon Gustin, MD;  Location: [facility];  Service: Urology;  Laterality: N/A;  . TRANSURETHRAL RESECTION OF BLADDER TUMOR N/A 06/18/2017   Procedure: TRANSURETHRAL RESECTION OF BLADDER TUMOR (TURBT);  Surgeon: Cleon Gustin, MD;  [facility];  Service: Urology;  Laterality: N/A;  . TRANSURETHRAL RESECTION OF BLADDER TUMOR N/A 09/11/2018   Procedure: TRANSURETHRAL RESECTION OF BLADDER TUMOR (TURBT);  Surgeon: Cleon Gustin, MD;  [facility];  Service: Urology;  Laterality: N/A;  . TRANSURETHRAL  RESECTION OF PROSTATE  08-20-2013   at [facility]- all systems are reviewed and negatives except as per HPI above  Current Outpatient Medications  Medication Sig Dispense Refill  . acetaminophen (TYLENOL) 500 MG tablet Take 1,000 mg by mouth every 6 (six) hours as needed for mild pain or headache.    Marland Kitchen atorvastatin (LIPITOR) 40 MG tablet Take 40 mg by mouth every evening.    . Carbidopa-Levodopa ER 36.25-145 MG CPCR  Take 3 capsules by mouth 4 (four) times daily.     . cefUROXime (CEFTIN) 250 MG tablet Take 1 tablet (250 mg total) by mouth 2 (two) times daily with a meal. 14 tablet 0  . chlorhexidine (PERIDEX) 0.12 % solution 15 mLs by Mouth Rinse route 2 (two) times daily.    . ciprofloxacin (CIPRO) 500 MG tablet Take 500 mg by mouth 2 (two) times daily.     . clopidogrel (PLAVIX) 75 MG tablet Take 1 tablet (75 mg total) by mouth daily.    . fesoterodine (TOVIAZ) 8 MG TB24 tablet Take 1 tablet (8 mg total) by mouth daily. 90 tablet 3  . fosfomycin (MONUROL) 3 g PACK Take 1 packet once then again two days. 3 g 0  . hydrochlorothiazide (HYDRODIURIL) 25 MG tablet Take 25 mg by mouth daily.    Marland Kitchen HYDROcodone-acetaminophen (NORCO/VICODIN) 5-325 MG tablet Take by mouth.    . Istradefylline 20 MG TABS Take by mouth.    . meclizine (ANTIVERT) 25 MG tablet Take 25 mg by mouth 3 (three) times daily as needed for dizziness.   2  . metroNIDAZOLE (FLAGYL) 500 MG tablet Take 500 mg by mouth 2 (two) times daily.    . nitrofurantoin, macrocrystal-monohydrate, (MACROBID) 100 MG capsule Take 1 capsule (100 mg total) by mouth every 12 (twelve) hours. 14 capsule 0  . NOURIANZ 40 MG TABS Take 1 tablet by mouth daily.     Marland Kitchen NUEDEXTA 20-10 MG CAPS Take 1 capsule by mouth 2 (two) times daily.  3  . pantoprazole (PROTONIX) 40 MG tablet Take 1 tablet by mouth daily.    . polyethylene glycol powder (GLYCOLAX/MIRALAX) 17 GM/SCOOP powder Take 17 g by mouth daily. 255 g 0  . RYTARY 48.75-195 MG CPCR Take by mouth.    . sodium phosphate (FLEET) 7-19 GM/118ML ENEM Place 133 mLs (1 enema total) rectally daily as needed for severe constipation. 133 mL 0  . Travoprost, BAK Free, (TRAVATAN) 0.004 % SOLN ophthalmic solution Place 1 drop into both eyes at bedtime.    . furosemide (LASIX) 20 MG tablet Take 1 tablet (20 mg total) by mouth as needed. (Patient taking differently: Take 20 mg by mouth daily as needed for fluid. ) 90 tablet 1   No  current facility-administered medications for this visit.    Physical Exam: Vitals:   12/27/20 1113  BP: 116/74  Pulse: 82  SpO2: 95%  Weight: 161 lb 9.6 oz (73.3 kg)  Height: 5\' 7"  (1.702 m)    GEN- The patient is elderly and frail appearing, alert and oriented x 3 today.   Head- normocephalic, atraumatic Eyes-  Sclera clear, conjunctiva pink Ears- hearing reduced, hearing aid in place Oropharynx- clear Lungs- Clear to ausculation bilaterally, normal work of breathing Heart- Regular rate and rhythm, no murmurs, rubs or gallops, PMI not laterally displaced GI- soft, NT, ND, + BS Extremities- no clubbing, cyanosis, + mild edema  Wt Readings from Last 3 Encounters:  12/27/20 161 lb 9.6 oz (73.3  kg)  08/25/20 165 lb (74.8 kg)  05/24/20 165 lb (74.8 kg)    EKG tracing ordered today is personally reviewed and shows sinus rhythm  Assessment and Plan:  1. Cryptogenic stroke Monitored with ILR without afib detected He is on plavix His ILR is at EOS.  We discussed risks and benefits to ILR removal including risks of bleeding and infection.  He is clear that he would like to have his ILR removed today.  2. HL Continue lipitor  3. HTN Stable No change required today    Risks, benefits and potential toxicities for medications prescribed and/or refilled reviewed with patient today.   Return as needed  Thompson Grayer MD, [facility] 12/27/2020 11:20 AM      PROCEDURES:   1. Implantable loop recorder explantation       DESCRIPTION OF PROCEDURE:  Informed written consent was obtained.  The patient required no sedation for the procedure today.   The patients left chest was therefore prepped and draped in the usual sterile fashion.  The skin overlying the ILR monitor was infiltrated with lidocaine for local analgesia.  A 0.5-cm incision was made over the site.  The previously implanted ILR was exposed and removed using a combination of sharp and blunt dissection.  Steri- Strips and a  sterile dressing were then applied. EBL<98ml.  There were no early apparent complications.     CONCLUSIONS:   1. Successful explantation of a [employer] Reveal LINQ implantable loop recorder   2. No early apparent complications.        Thompson Grayer MD, [facility] 12/27/2020 11:26 AM

## 2020-12-27 NOTE — Patient Instructions (Signed)
Medication Instructions:  Your physician recommends that you continue on your current medications as directed. Please refer to the Current Medication list given to you today.  *If you need a refill on your cardiac medications before your next appointment, please call your pharmacy*   Lab Work: None ordered If you have labs (blood work) drawn today and your tests are completely normal, you will receive your results only by: Marland Kitchen MyChart Message (if you have MyChart) OR . A paper copy in the mail If you have any lab test that is abnormal or we need to change your treatment, we will call you to review the results.   Testing/Procedures: None ordered   Follow-Up:  Your physician recommends that you schedule a follow-up appointment as needed

## 2021-01-05 ENCOUNTER — Encounter: Payer: Self-pay | Admitting: Urology

## 2021-01-05 ENCOUNTER — Other Ambulatory Visit: Payer: Self-pay

## 2021-01-05 ENCOUNTER — Ambulatory Visit (INDEPENDENT_AMBULATORY_CARE_PROVIDER_SITE_OTHER): Payer: Medicare HMO | Admitting: Urology

## 2021-01-05 VITALS — BP 115/76 | HR 82 | Temp 98.4°F | Ht 67.0 in | Wt 161.6 lb

## 2021-01-05 DIAGNOSIS — N401 Enlarged prostate with lower urinary tract symptoms: Secondary | ICD-10-CM

## 2021-01-05 DIAGNOSIS — R339 Retention of urine, unspecified: Secondary | ICD-10-CM | POA: Diagnosis not present

## 2021-01-05 DIAGNOSIS — N390 Urinary tract infection, site not specified: Secondary | ICD-10-CM

## 2021-01-05 DIAGNOSIS — N138 Other obstructive and reflux uropathy: Secondary | ICD-10-CM

## 2021-01-05 LAB — URINALYSIS, ROUTINE W REFLEX MICROSCOPIC
Bilirubin, UA: NEGATIVE
Ketones, UA: NEGATIVE
Nitrite, UA: POSITIVE — AB
Protein,UA: NEGATIVE
Specific Gravity, UA: 1.015 (ref 1.005–1.030)
Urobilinogen, Ur: 0.2 mg/dL (ref 0.2–1.0)
pH, UA: 5 (ref 5.0–7.5)

## 2021-01-05 LAB — MICROSCOPIC EXAMINATION
Renal Epithel, UA: NONE SEEN /hpf
WBC, UA: >30 /hpf — AB (ref 0–5)

## 2021-01-05 LAB — BLADDER SCAN AMB NON-IMAGING: Scan Result: 123

## 2021-01-05 MED ORDER — NITROFURANTOIN MONOHYD MACRO 100 MG PO CAPS: 100.0000 mg | ORAL_CAPSULE | Freq: Two times a day (BID) | ORAL | 0 refills | Status: DC

## 2021-01-05 NOTE — Progress Notes (Signed)
Bladder Scan Patient can void: 123 ml Performed By: Durenda Guthrie, lpn  Urological Symptom Review  Patient is experiencing the following symptoms: Frequent urination Hard to postpone urination Get up at night to urinate Leakage of urine Blood in urine Urinary tract infection   Review of Systems  Gastrointestinal (upper)  : Negative for upper GI symptoms  Gastrointestinal (lower) : Negative for lower GI symptoms  Constitutional : Night Sweats Fatigue  Skin: Negative for skin symptoms  Eyes: Blurred vision Double vision  Ear/Nose/Throat : Negative for Ear/Nose/Throat symptoms  Hematologic/Lymphatic: Negative for Hematologic/Lymphatic symptoms  Cardiovascular : Leg swelling  Respiratory : Negative for respiratory symptoms  Endocrine: Negative for endocrine symptoms  Musculoskeletal: Negative for musculoskeletal symptoms  Neurological: Headaches  Psychologic: Negative for psychiatric symptoms

## 2021-01-05 NOTE — Patient Instructions (Signed)
Urinary Tract Infection, Adult A urinary tract infection (UTI) is an infection of any part of the urinary tract. The urinary tract includes:  The kidneys.  The ureters.  The bladder.  The urethra. These organs make, store, and get rid of pee (urine) in the body. What are the causes? This infection is caused by germs (bacteria) in your genital area. These germs grow and cause swelling (inflammation) of your urinary tract. What increases the risk? The following factors may make you more likely to develop this condition:  Using a small, thin tube (catheter) to drain pee.  Not being able to control when you pee or poop (incontinence).  Being male. If you are male, these things can increase the risk: ? Using these methods to prevent pregnancy:  A medicine that kills sperm (spermicide).  A device that blocks sperm (diaphragm). ? Having low levels of a male hormone (estrogen). ? Being pregnant. You are more likely to develop this condition if:  You have genes that add to your risk.  You are sexually active.  You take antibiotic medicines.  You have trouble peeing because of: ? A prostate that is bigger than normal, if you are male. ? A blockage in the part of your body that drains pee from the bladder. ? A kidney stone. ? A nerve condition that affects your bladder. ? Not getting enough to drink. ? Not peeing often enough.  You have other conditions, such as: ? Diabetes. ? A weak disease-fighting system (immune system). ? Sickle cell disease. ? Gout. ? Injury of the spine. What are the signs or symptoms? Symptoms of this condition include:  Needing to pee right away.  Peeing small amounts often.  Pain or burning when peeing.  Blood in the pee.  Pee that smells bad or not like normal.  Trouble peeing.  Pee that is cloudy.  Fluid coming from the vagina, if you are male.  Pain in the belly or lower back. Other symptoms include:  Vomiting.  Not  feeling hungry.  Feeling mixed up (confused). This may be the first symptom in older adults.  Being tired and grouchy (irritable).  A fever.  Watery poop (diarrhea). How is this treated?  Taking antibiotic medicine.  Taking other medicines.  Drinking enough water. In some cases, you may need to see a specialist. Follow these instructions at home: Medicines  Take over-the-counter and prescription medicines only as told by your doctor.  If you were prescribed an antibiotic medicine, take it as told by your doctor. Do not stop taking it even if you start to feel better. General instructions  Make sure you: ? Pee until your bladder is empty. ? Do not hold pee for a long time. ? Empty your bladder after sex. ? Wipe from front to back after peeing or pooping if you are a male. Use each tissue one time when you wipe.  Drink enough fluid to keep your pee pale yellow.  Keep all follow-up visits.   Contact a doctor if:  You do not get better after 1-2 days.  Your symptoms go away and then come back. Get help right away if:  You have very bad back pain.  You have very bad pain in your lower belly.  You have a fever.  You have chills.  You feeling like you will vomit or you vomit. Summary  A urinary tract infection (UTI) is an infection of any part of the urinary tract.  This condition is caused by   germs in your genital area.  There are many risk factors for a UTI.  Treatment includes antibiotic medicines.  Drink enough fluid to keep your pee pale yellow. This information is not intended to replace advice given to you by your health care provider. Make sure you discuss any questions you have with your health care provider. Document Revised: 06/25/2020 Document Reviewed: 06/25/2020 Elsevier Patient Education  2021 Elsevier Inc.  

## 2021-01-05 NOTE — Progress Notes (Signed)
01/05/2021 2:26 PM   Mike Nixon Mike Nixon 12/21/1940 [id number]  Referring provider: [name], MD [place],  [place] [address]  BPH and UTI  HPI: Mike Nixon is a 80yo here for followup for BPH. Since last visit he has been treated 3 times for UTI. He has finished antibitoics 1 week ago and UA today is concerning for infection. No recent abdominal imaging. Stream is fair. He has urinary urgency, frequency every 1-2 hours and nocturia 3-4x. Rare incontinence. He has intermittent dysuria and wife states his urine is foul smelling today.    PMH: Past Medical History:  Diagnosis Date  . Anticoagulant long-term use    PLAVIX  . Benign localized prostatic hyperplasia with lower urinary tract symptoms (LUTS)   . Bladder tumor   . Borderline diabetes    watches diet  . ED (erectile dysfunction) of organic origin   . Gait disturbance, post-stroke    uses wheelchair long distance  . Glaucoma, right eye   . History of bleeding peptic ulcer    03/ 1979  s/p  surgery  . History of concussion    1987 no loc-- no residual  . History of CVA with residual deficit    05/ 2016 CYPTOGENIC CVA --- RESIDUAL GAIT DISTURBANCE AND SIALORRHEA ACCESS (DROOLING)  . Hyperlipidemia   . Hypertension   . Mild atherosclerosis of both carotid arteries    per duplex from outside facitlity 04-08-2015 less then 50% stenosis bilateral ICA  . Parkinson's disease [facility]) neurologist -- dr r. Mitzi Hansen falconer ([place] [facility], [facility])   dx 2015  . PBA (pseudobulbar affect)    laughter-- residual from cva 05/ 2016  . PFO (patent foramen ovale)    per TEE 08-18-2016  . Speech impairment    post-stroke  . Status post placement of implantable loop recorder 08/18/2016   placed by dr allred  . Wears glasses     Surgical History: Past Surgical History:  Procedure Laterality Date  . APPENDECTOMY  1978  . CATARACT EXTRACTION W/ INTRAOCULAR LENS IMPLANT Right 2014 approx.  . CYSTOSCOPY W/  URETERAL STENT PLACEMENT Bilateral 05/07/2017   Procedure: CYSTOSCOPY WITH RETROGRADE PYELOGRAM/URETERAL;  Surgeon: Cleon Gustin, MD;  Location: [facility];  Service: Urology;  Laterality: Bilateral;  . EP IMPLANTABLE DEVICE N/A 08/18/2016   Procedure: Loop Recorder Insertion;  Surgeon: Thompson Grayer, MD;  Location: [place] [facility];  Service: Cardiovascular;  Laterality: N/A;  . implantable loop recorder removal  12/27/2020   LINQ removed for EOS  . REMOVAL NODULES BILATERAL FLANK  2008 approx.   per pt benign fatty tissue  . REPAIR OF PERFORATED ULCER  01/1978   bleeding peptic ulcer  . TEE WITHOUT CARDIOVERSION N/A 08/18/2016   Procedure: TRANSESOPHAGEAL ECHOCARDIOGRAM (TEE);  Surgeon: Sueanne Margarita, MD;  Location: [facility] ENDOSCOPY;  Service: Cardiovascular;  Laterality: N/A; no evidence thrombus;  mobile atrial septum w/ positive bubble study consistent with PFO/ trivial AR, mild TR  . TRANSTHORACIC ECHOCARDIOGRAM  04/09/2015   ef 34-19%, grade 1 diastolic dysfunction/  mild AV sclerosis without stenosis/  mild TR,  trivial Mike and PR  . TRANSURETHRAL RESECTION OF BLADDER TUMOR N/A 05/07/2017   Procedure: TRANSURETHRAL RESECTION OF BLADDER TUMOR (TURBT);  Surgeon: Cleon Gustin, MD;  Location: [facility];  Service: Urology;  Laterality: N/A;  . TRANSURETHRAL RESECTION OF BLADDER TUMOR N/A 06/18/2017   Procedure: TRANSURETHRAL RESECTION OF BLADDER TUMOR (TURBT);  Surgeon: Nicolette Bang  L, MD;  [facility];  Service: Urology;  Laterality: N/A;  . TRANSURETHRAL RESECTION OF BLADDER TUMOR N/A 09/11/2018   Procedure: TRANSURETHRAL RESECTION OF BLADDER TUMOR (TURBT);  Surgeon: Cleon Gustin, MD;  [facility];  Service: Urology;  Laterality: N/A;  . TRANSURETHRAL RESECTION OF PROSTATE  08-20-2013   at [facility] Medications:  Allergies as of 01/05/2021      Reactions   Benazepril Cough   Doxycycline Other (See Comments)   Unknown    Levofloxacin Hives, Swelling   Simvastatin Other (See Comments)   Pt does not remember what the reaction was. ?muscle aches   Tamsulosin Hives, Swelling   Tramadol Hcl Hives, Swelling   Sulfa Antibiotics Hives, Swelling, Rash, Other (See Comments)   Severe rash      Medication List       Accurate as of January 05, 2021  2:26 PM. If you have any questions, ask your nurse or doctor.        acetaminophen 500 MG tablet Commonly known as: TYLENOL Take 1,000 mg by mouth every 6 (six) hours as needed for mild pain or headache.   atorvastatin 40 MG tablet Commonly known as: LIPITOR Take 40 mg by mouth every evening.   Carbidopa-Levodopa ER 36.25-145 MG Cpcr Take 3 capsules by mouth 4 (four) times daily.   Rytary 48.75-195 MG Cpcr Generic drug: Carbidopa-Levodopa ER Take by mouth.   cefUROXime 250 MG tablet Commonly known as: CEFTIN Take 1 tablet (250 mg total) by mouth 2 (two) times daily with a meal.   chlorhexidine 0.12 % solution Commonly known as: PERIDEX 15 mLs by Mouth Rinse route 2 (two) times daily.   ciprofloxacin 500 MG tablet Commonly known as: CIPRO Take 500 mg by mouth 2 (two) times daily.   clopidogrel 75 MG tablet Commonly known as: PLAVIX Take 1 tablet (75 mg total) by mouth daily.   fosfomycin 3 g Pack Commonly known as: Monurol Take 1 packet once then again two days.   furosemide 20 MG tablet Commonly known as: LASIX Take 1 tablet (20 mg total) by mouth as needed. What changed:   when to take this  reasons to take this   hydrochlorothiazide 25 MG tablet Commonly known as: HYDRODIURIL Take 25 mg by mouth daily.   HYDROcodone-acetaminophen 5-325 MG tablet Commonly known as: NORCO/VICODIN Take by mouth.   meclizine 25 MG tablet Commonly known as: ANTIVERT Take 25 mg by mouth 3 (three) times daily as needed for dizziness.   metroNIDAZOLE 500 MG tablet Commonly known as: FLAGYL Take 500 mg by mouth 2 (two) times daily.   nitrofurantoin  (macrocrystal-monohydrate) 100 MG capsule Commonly known as: MACROBID Take 1 capsule (100 mg total) by mouth every 12 (twelve) hours.   Nourianz 40 MG Tabs Generic drug: Istradefylline Take 1 tablet by mouth daily.   Istradefylline 20 MG Tabs Take by mouth.   Nuedexta 20-10 MG capsule Generic drug: Dextromethorphan-quiNIDine Take 1 capsule by mouth 2 (two) times daily.   pantoprazole 40 MG tablet Commonly known as: PROTONIX Take 1 tablet by mouth daily.   polyethylene glycol powder 17 GM/SCOOP powder Commonly known as: GLYCOLAX/MIRALAX Take 17 g by mouth daily.   sodium phosphate 7-19 GM/118ML Enem Place 133 mLs (1 enema total) rectally daily as needed for severe constipation.   Toviaz 8 MG Tb24 tablet Generic drug: fesoterodine Take 1 tablet (8 mg total) by mouth daily.   Travoprost (BAK Free) 0.004 % Soln ophthalmic solution Commonly  known as: TRAVATAN Place 1 drop into both eyes at bedtime.       Allergies:  Allergies  Allergen Reactions  . Benazepril Cough  . Doxycycline Other (See Comments)    Unknown  . Levofloxacin Hives and Swelling  . Simvastatin Other (See Comments)    Pt does not remember what the reaction was. ?muscle aches  . Tamsulosin Hives and Swelling  . Tramadol Hcl Hives and Swelling  . Sulfa Antibiotics Hives, Swelling, Rash and Other (See Comments)    Severe rash    Family History: Family History  Problem Relation Age of Onset  . Stroke Sister     Social History:  reports that he has never smoked. He has never used smokeless tobacco. He reports that he does not drink alcohol and does not use drugs.  ROS: All other review of systems were reviewed and are negative except what is noted above in HPI  Physical Exam: BP 115/76   Pulse 82   Temp 98.4 F (36.9 C)   Ht 5\' 7"  (1.702 m)   Wt 161 lb 9.6 oz (73.3 kg)   BMI 25.31 kg/m   Constitutional:  Alert and oriented, No acute distress. HEENT: [place] AT, moist mucus membranes.  Trachea  midline, no masses. Cardiovascular: No clubbing, cyanosis, or edema. Respiratory: Normal respiratory effort, no increased work of breathing. GI: Abdomen is soft, nontender, nondistended, no abdominal masses GU: No CVA tenderness.  Lymph: No cervical or inguinal lymphadenopathy. Skin: No rashes, bruises or suspicious lesions. Neurologic: Grossly intact, no focal deficits, moving all 4 extremities. Psychiatric: Normal mood and affect.  Laboratory Data: Lab Results  Component Value Date   WBC 5.7 05/14/2020   HGB 14.3 05/14/2020   HCT 46.1 05/14/2020   MCV 82.9 05/14/2020   PLT 232 05/14/2020    Lab Results  Component Value Date   CREATININE 1.52 (H) 05/14/2020    No results found for: PSA  No results found for: TESTOSTERONE  Lab Results  Component Value Date   HGBA1C 6.4 (H) 06/12/2017    Urinalysis    Component Value Date/Time   COLORURINE YELLOW 05/14/2020 1440   APPEARANCEUR Clear 12/08/2020 1551   LABSPEC 1.016 05/14/2020 1440   PHURINE 5.0 05/14/2020 1440   GLUCOSEU 1+ (A) 12/08/2020 1551   HGBUR NEGATIVE 05/14/2020 1440   BILIRUBINUR Negative 12/08/2020 1551   KETONESUR NEGATIVE 05/14/2020 1440   PROTEINUR Trace (A) 12/08/2020 1551   PROTEINUR NEGATIVE 05/14/2020 1440   UROBILINOGEN negative (A) 03/24/2020 1519   NITRITE Negative 12/08/2020 1551   NITRITE NEGATIVE 05/14/2020 1440   LEUKOCYTESUR Negative 12/08/2020 1551   LEUKOCYTESUR TRACE (A) 05/14/2020 1440    Lab Results  Component Value Date   LABMICR See below: 12/08/2020   WBCUA None seen 12/08/2020   LABEPIT None seen 12/08/2020   BACTERIA None seen 12/08/2020    Pertinent Imaging:  No results found for this or any previous visit.  No results found for this or any previous visit.  No results found for this or any previous visit.  No results found for this or any previous visit.  No results found for this or any previous visit.  No results found for this or any previous visit.  No  results found for this or any previous visit.  No results found for this or any previous visit.   Assessment & Plan:    1. Urinary tract infection without hematuria, site unspecified -urine for culture, will call with results -macrobid 100mg  BID  for 7 days -CT stone study to assess for GU calculi - Urinalysis, Routine w reflex microscopic - BLADDER SCAN AMB NON-IMAGING  2. BPH with LUTS, incomplete emptying -continue double voiding   No follow-ups on file.  Nicolette Bang, MD  [facility] Urology [place]

## 2021-01-07 ENCOUNTER — Ambulatory Visit (HOSPITAL_COMMUNITY)
Admission: RE | Admit: 2021-01-07 | Discharge: 2021-01-07 | Disposition: A | Discharge status: Home / Self Care | Payer: Medicare HMO | Source: Ambulatory Visit | Attending: Urology | Admitting: Urology

## 2021-01-07 ENCOUNTER — Other Ambulatory Visit: Payer: Self-pay

## 2021-01-07 DIAGNOSIS — N390 Urinary tract infection, site not specified: Secondary | ICD-10-CM | POA: Insufficient documentation

## 2021-01-07 DIAGNOSIS — N3289 Other specified disorders of bladder: Secondary | ICD-10-CM | POA: Diagnosis not present

## 2021-01-08 LAB — URINE CULTURE

## 2021-01-11 ENCOUNTER — Telehealth: Payer: Self-pay

## 2021-01-11 ENCOUNTER — Other Ambulatory Visit: Payer: Self-pay

## 2021-01-11 DIAGNOSIS — N390 Urinary tract infection, site not specified: Secondary | ICD-10-CM

## 2021-01-11 MED ORDER — AMOXICILLIN-POT CLAVULANATE 875-125 MG PO TABS: 1.0000 | ORAL_TABLET | Freq: Two times a day (BID) | ORAL | 0 refills | Status: DC

## 2021-01-11 NOTE — Progress Notes (Signed)
Pts wife notified of new antibiotic to take and stop Macrobid.

## 2021-01-11 NOTE — Telephone Encounter (Signed)
-----   Message from Cleon Gustin, MD sent at 01/11/2021 10:08 AM EST ----- No calculi ----- Message ----- From: Valentina Lucks, LPN Sent: 9/53/2023   9:34 AM EST To: Cleon Gustin, MD  Pls review.

## 2021-01-11 NOTE — Telephone Encounter (Signed)
Left message on wifes phone saying no stones on CT results. Pts wife called back and I spoke with her about results.

## 2021-01-11 NOTE — Telephone Encounter (Signed)
-----   Message from Cleon Gustin, MD sent at 01/11/2021 10:08 AM EST ----- No calculi ----- Message ----- From: Valentina Lucks, LPN Sent: 0/01/4916   9:34 AM EST To: Cleon Gustin, MD  Pls review.

## 2021-01-19 ENCOUNTER — Ambulatory Visit: Payer: Medicare HMO | Admitting: Urology

## 2021-01-24 ENCOUNTER — Ambulatory Visit (INDEPENDENT_AMBULATORY_CARE_PROVIDER_SITE_OTHER): Payer: Medicare HMO | Admitting: Urology

## 2021-01-24 ENCOUNTER — Other Ambulatory Visit: Payer: Self-pay

## 2021-01-24 ENCOUNTER — Encounter: Payer: Self-pay | Admitting: Urology

## 2021-01-24 VITALS — BP 123/85 | HR 88 | Temp 98.2°F | Ht 67.0 in | Wt 169.0 lb

## 2021-01-24 DIAGNOSIS — N401 Enlarged prostate with lower urinary tract symptoms: Secondary | ICD-10-CM | POA: Diagnosis not present

## 2021-01-24 DIAGNOSIS — N390 Urinary tract infection, site not specified: Secondary | ICD-10-CM

## 2021-01-24 DIAGNOSIS — N138 Other obstructive and reflux uropathy: Secondary | ICD-10-CM

## 2021-01-24 LAB — MICROSCOPIC EXAMINATION
Bacteria, UA: NONE SEEN
Epithelial Cells (non renal): NONE SEEN /hpf (ref 0–10)
RBC, Urine: NONE SEEN /hpf (ref 0–2)
Renal Epithel, UA: NONE SEEN /hpf
WBC, UA: NONE SEEN /hpf (ref 0–5)

## 2021-01-24 LAB — URINALYSIS, ROUTINE W REFLEX MICROSCOPIC
Bilirubin, UA: NEGATIVE
Glucose, UA: NEGATIVE
Ketones, UA: NEGATIVE
Leukocytes,UA: NEGATIVE
Nitrite, UA: NEGATIVE
RBC, UA: NEGATIVE
Specific Gravity, UA: >1.03 — ABNORMAL HIGH (ref 1.005–1.030)
Urobilinogen, Ur: 0.2 mg/dL (ref 0.2–1.0)
pH, UA: 5 (ref 5.0–7.5)

## 2021-01-24 LAB — BLADDER SCAN AMB NON-IMAGING: Scan Result: 14

## 2021-01-24 MED ORDER — NITROFURANTOIN MACROCRYSTAL 50 MG PO CAPS: 50.0000 mg | ORAL_CAPSULE | Freq: Every day | ORAL | 11 refills | Status: DC

## 2021-01-24 NOTE — Patient Instructions (Signed)
Urinary Tract Infection, Adult A urinary tract infection (UTI) is an infection of any part of the urinary tract. The urinary tract includes:  The kidneys.  The ureters.  The bladder.  The urethra. These organs make, store, and get rid of pee (urine) in the body. What are the causes? This infection is caused by germs (bacteria) in your genital area. These germs grow and cause swelling (inflammation) of your urinary tract. What increases the risk? The following factors may make you more likely to develop this condition:  Using a small, thin tube (catheter) to drain pee.  Not being able to control when you pee or poop (incontinence).  Being male. If you are male, these things can increase the risk: ? Using these methods to prevent pregnancy:  A medicine that kills sperm (spermicide).  A device that blocks sperm (diaphragm). ? Having low levels of a male hormone (estrogen). ? Being pregnant. You are more likely to develop this condition if:  You have genes that add to your risk.  You are sexually active.  You take antibiotic medicines.  You have trouble peeing because of: ? A prostate that is bigger than normal, if you are male. ? A blockage in the part of your body that drains pee from the bladder. ? A kidney stone. ? A nerve condition that affects your bladder. ? Not getting enough to drink. ? Not peeing often enough.  You have other conditions, such as: ? Diabetes. ? A weak disease-fighting system (immune system). ? Sickle cell disease. ? Gout. ? Injury of the spine. What are the signs or symptoms? Symptoms of this condition include:  Needing to pee right away.  Peeing small amounts often.  Pain or burning when peeing.  Blood in the pee.  Pee that smells bad or not like normal.  Trouble peeing.  Pee that is cloudy.  Fluid coming from the vagina, if you are male.  Pain in the belly or lower back. Other symptoms include:  Vomiting.  Not  feeling hungry.  Feeling mixed up (confused). This may be the first symptom in older adults.  Being tired and grouchy (irritable).  A fever.  Watery poop (diarrhea). How is this treated?  Taking antibiotic medicine.  Taking other medicines.  Drinking enough water. In some cases, you may need to see a specialist. Follow these instructions at home: Medicines  Take over-the-counter and prescription medicines only as told by your doctor.  If you were prescribed an antibiotic medicine, take it as told by your doctor. Do not stop taking it even if you start to feel better. General instructions  Make sure you: ? Pee until your bladder is empty. ? Do not hold pee for a long time. ? Empty your bladder after sex. ? Wipe from front to back after peeing or pooping if you are a male. Use each tissue one time when you wipe.  Drink enough fluid to keep your pee pale yellow.  Keep all follow-up visits.   Contact a doctor if:  You do not get better after 1-2 days.  Your symptoms go away and then come back. Get help right away if:  You have very bad back pain.  You have very bad pain in your lower belly.  You have a fever.  You have chills.  You feeling like you will vomit or you vomit. Summary  A urinary tract infection (UTI) is an infection of any part of the urinary tract.  This condition is caused by   germs in your genital area.  There are many risk factors for a UTI.  Treatment includes antibiotic medicines.  Drink enough fluid to keep your pee pale yellow. This information is not intended to replace advice given to you by your health care provider. Make sure you discuss any questions you have with your health care provider. Document Revised: 06/25/2020 Document Reviewed: 06/25/2020 Elsevier Patient Education  2021 Elsevier Inc.  

## 2021-01-24 NOTE — Progress Notes (Signed)
scanUrological Symptom Review  Patient is experiencing the following symptoms: Frequent urination Hard to postpone urination Get up at night to urinate Leakage of urine Blood in urine Urinary tract infection Erection problems (male only)   Review of Systems  Gastrointestinal (upper)  : Nausea Indigestion/heartburn  Gastrointestinal (lower) : Negative for lower GI symptoms  Constitutional : Weight loss Fatigue  Skin: Negative for skin symptoms  Eyes: Blurred vision  Ear/Nose/Throat : Negative for Ear/Nose/Throat symptoms  Hematologic/Lymphatic: Negative for Hematologic/Lymphatic symptoms  Cardiovascular : Negative for cardiovascular symptoms  Respiratory : Shortness of breath  Endocrine: Excessive thirst  Musculoskeletal: Negative for musculoskeletal symptoms  Neurological: Headaches  Psychologic: Depression Anxiety

## 2021-01-24 NOTE — Progress Notes (Signed)
01/24/2021 12:25 PM   [name] [birth year] [id number]  Referring provider: [name], MD [place],  [place] [address]  followup recurrent UTI  HPI: Mr Mike Nixon is a 80yo here for followup for recurrent UTI. CT stone study showed no calculi. Last urine culture was E coli. He has ahd numerous UTIs over the past 6 months. No hx of prophylactic antibiotics. Urine per wife is better and not fould smelling. He has stable mild LUTS.    PMH: Past Medical History:  Diagnosis Date  . Anticoagulant long-term use    PLAVIX  . Benign localized prostatic hyperplasia with lower urinary tract symptoms (LUTS)   . Bladder tumor   . Borderline diabetes    watches diet  . ED (erectile dysfunction) of organic origin   . Gait disturbance, post-stroke    uses wheelchair long distance  . Glaucoma, right eye   . History of bleeding peptic ulcer    03/ 1979  s/p  surgery  . History of concussion    1987 no loc-- no residual  . History of CVA with residual deficit    05/ 2016 CYPTOGENIC CVA --- RESIDUAL GAIT DISTURBANCE AND SIALORRHEA ACCESS (DROOLING)  . Hyperlipidemia   . Hypertension   . Mild atherosclerosis of both carotid arteries    per duplex from outside facitlity 04-08-2015 less then 50% stenosis bilateral ICA  . Parkinson's disease [facility]) neurologist -- dr r. Mitzi Hansen falconer ([facility], [place] [facility])   dx 2015  . PBA (pseudobulbar affect)    laughter-- residual from cva 05/ 2016  . PFO (patent foramen ovale)    per TEE 08-18-2016  . Speech impairment    post-stroke  . Status post placement of implantable loop recorder 08/18/2016   placed by dr allred  . Wears glasses     Surgical History: Past Surgical History:  Procedure Laterality Date  . APPENDECTOMY  1978  . CATARACT EXTRACTION W/ INTRAOCULAR LENS IMPLANT Right 2014 approx.  . CYSTOSCOPY W/ URETERAL STENT PLACEMENT Bilateral 05/07/2017   Procedure: CYSTOSCOPY WITH RETROGRADE  PYELOGRAM/URETERAL;  Surgeon: Cleon Gustin, MD;  Location: [facility];  Service: Urology;  Laterality: Bilateral;  . EP IMPLANTABLE DEVICE N/A 08/18/2016   Procedure: Loop Recorder Insertion;  Surgeon: Thompson Grayer, MD;  Location: [place] [facility];  Service: Cardiovascular;  Laterality: N/A;  . implantable loop recorder removal  12/27/2020   LINQ removed for EOS  . REMOVAL NODULES BILATERAL FLANK  2008 approx.   per pt benign fatty tissue  . REPAIR OF PERFORATED ULCER  01/1978   bleeding peptic ulcer  . TEE WITHOUT CARDIOVERSION N/A 08/18/2016   Procedure: TRANSESOPHAGEAL ECHOCARDIOGRAM (TEE);  Surgeon: Sueanne Margarita, MD;  Location: [facility];  Service: Cardiovascular;  Laterality: N/A; no evidence thrombus;  mobile atrial septum w/ positive bubble study consistent with PFO/ trivial AR, mild TR  . TRANSTHORACIC ECHOCARDIOGRAM  04/09/2015   ef 66-06%, grade 1 diastolic dysfunction/  mild AV sclerosis without stenosis/  mild TR,  trivial MR and PR  . TRANSURETHRAL RESECTION OF BLADDER TUMOR N/A 05/07/2017   Procedure: TRANSURETHRAL RESECTION OF BLADDER TUMOR (TURBT);  Surgeon: Cleon Gustin, MD;  Location: [facility];  Service: Urology;  Laterality: N/A;  . TRANSURETHRAL RESECTION OF BLADDER TUMOR N/A 06/18/2017   Procedure: TRANSURETHRAL RESECTION OF BLADDER TUMOR (TURBT);  Surgeon: Cleon Gustin, MD;  Location: [facility];  Service: Urology;  Laterality: N/A;  . TRANSURETHRAL RESECTION  OF BLADDER TUMOR N/A 09/11/2018   Procedure: TRANSURETHRAL RESECTION OF BLADDER TUMOR (TURBT);  Surgeon: Cleon Gustin, MD;  Location: [facility];  Service: Urology;  Laterality: N/A;  . TRANSURETHRAL RESECTION OF PROSTATE  08-20-2013   at [facility] Medications:  Allergies as of 01/24/2021      Reactions   Benazepril Cough   Doxycycline Other (See Comments)   Unknown   Levofloxacin Hives, Swelling   Simvastatin Other (See Comments)   Pt does not  remember what the reaction was. ?muscle aches   Tamsulosin Hives, Swelling   Tramadol Hcl Hives, Swelling   Sulfa Antibiotics Hives, Swelling, Rash, Other (See Comments)   Severe rash      Medication List       Accurate as of January 24, 2021 12:25 PM. If you have any questions, ask your nurse or doctor.        acetaminophen 500 MG tablet Commonly known as: TYLENOL Take 1,000 mg by mouth every 6 (six) hours as needed for mild pain or headache.   amoxicillin-clavulanate 875-125 MG tablet Commonly known as: AUGMENTIN Take 1 tablet by mouth every 12 (twelve) hours.   atorvastatin 40 MG tablet Commonly known as: LIPITOR Take 40 mg by mouth every evening.   Carbidopa-Levodopa ER 36.25-145 MG Cpcr Take 3 capsules by mouth 4 (four) times daily.   Rytary 48.75-195 MG Cpcr Generic drug: Carbidopa-Levodopa ER Take by mouth.   cefUROXime 250 MG tablet Commonly known as: CEFTIN Take 1 tablet (250 mg total) by mouth 2 (two) times daily with a meal.   chlorhexidine 0.12 % solution Commonly known as: PERIDEX 15 mLs by Mouth Rinse route 2 (two) times daily.   ciprofloxacin 500 MG tablet Commonly known as: CIPRO Take 500 mg by mouth 2 (two) times daily.   clopidogrel 75 MG tablet Commonly known as: PLAVIX Take 1 tablet (75 mg total) by mouth daily.   fosfomycin 3 g Pack Commonly known as: Monurol Take 1 packet once then again two days.   furosemide 20 MG tablet Commonly known as: LASIX Take 1 tablet (20 mg total) by mouth as needed. What changed:   when to take this  reasons to take this   hydrochlorothiazide 25 MG tablet Commonly known as: HYDRODIURIL Take 25 mg by mouth daily.   HYDROcodone-acetaminophen 5-325 MG tablet Commonly known as: NORCO/VICODIN Take by mouth.   meclizine 25 MG tablet Commonly known as: ANTIVERT Take 25 mg by mouth 3 (three) times daily as needed for dizziness.   metroNIDAZOLE 500 MG tablet Commonly known as: FLAGYL Take 500 mg by  mouth 2 (two) times daily.   nitrofurantoin (macrocrystal-monohydrate) 100 MG capsule Commonly known as: MACROBID Take 1 capsule (100 mg total) by mouth every 12 (twelve) hours.   Nourianz 40 MG Tabs Generic drug: Istradefylline Take 1 tablet by mouth daily.   Istradefylline 20 MG Tabs Take by mouth.   Nuedexta 20-10 MG capsule Generic drug: Dextromethorphan-quiNIDine Take 1 capsule by mouth 2 (two) times daily.   pantoprazole 40 MG tablet Commonly known as: PROTONIX Take 1 tablet by mouth daily.   polyethylene glycol powder 17 GM/SCOOP powder Commonly known as: GLYCOLAX/MIRALAX Take 17 g by mouth daily.   sodium phosphate 7-19 GM/118ML Enem Place 133 mLs (1 enema total) rectally daily as needed for severe constipation.   Toviaz 8 MG Tb24 tablet Generic drug: fesoterodine Take 1 tablet (8 mg total) by mouth daily.   Travoprost (BAK Free) 0.004 % Soln ophthalmic  solution Commonly known as: TRAVATAN Place 1 drop into both eyes at bedtime.       Allergies:  Allergies  Allergen Reactions  . Benazepril Cough  . Doxycycline Other (See Comments)    Unknown  . Levofloxacin Hives and Swelling  . Simvastatin Other (See Comments)    Pt does not remember what the reaction was. ?muscle aches  . Tamsulosin Hives and Swelling  . Tramadol Hcl Hives and Swelling  . Sulfa Antibiotics Hives, Swelling, Rash and Other (See Comments)    Severe rash    Family History: Family History  Problem Relation Age of Onset  . Stroke Sister     Social History:  reports that he has never smoked. He has never used smokeless tobacco. He reports that he does not drink alcohol and does not use drugs.  ROS: All other review of systems were reviewed and are negative except what is noted above in HPI  Physical Exam: BP 123/85   Pulse 88   Temp 98.2 F (36.8 C)   Ht 5\' 7"  (1.702 m)   Wt 169 lb (76.7 kg)   BMI 26.47 kg/m   Constitutional:  Alert and oriented, No acute distress. HEENT:  [place] AT, moist mucus membranes.  Trachea midline, no masses. Cardiovascular: No clubbing, cyanosis, or edema. Respiratory: Normal respiratory effort, no increased work of breathing. GI: Abdomen is soft, nontender, nondistended, no abdominal masses GU: No CVA tenderness.  Lymph: No cervical or inguinal lymphadenopathy. Skin: No rashes, bruises or suspicious lesions. Neurologic: Grossly intact, no focal deficits, moving all 4 extremities. Psychiatric: Normal mood and affect.  Laboratory Data: Lab Results  Component Value Date   WBC 5.7 05/14/2020   HGB 14.3 05/14/2020   HCT 46.1 05/14/2020   MCV 82.9 05/14/2020   PLT 232 05/14/2020    Lab Results  Component Value Date   CREATININE 1.52 (H) 05/14/2020    No results found for: PSA  No results found for: TESTOSTERONE  Lab Results  Component Value Date   HGBA1C 6.4 (H) 06/12/2017    Urinalysis    Component Value Date/Time   COLORURINE YELLOW 05/14/2020 1440   APPEARANCEUR Clear 01/24/2021 1205   LABSPEC 1.016 05/14/2020 1440   PHURINE 5.0 05/14/2020 1440   GLUCOSEU Negative 01/24/2021 1205   HGBUR NEGATIVE 05/14/2020 1440   BILIRUBINUR Negative 01/24/2021 1205   KETONESUR NEGATIVE 05/14/2020 1440   PROTEINUR 1+ (A) 01/24/2021 1205   PROTEINUR NEGATIVE 05/14/2020 1440   UROBILINOGEN negative (A) 03/24/2020 1519   NITRITE Negative 01/24/2021 1205   NITRITE NEGATIVE 05/14/2020 1440   LEUKOCYTESUR Negative 01/24/2021 1205   LEUKOCYTESUR TRACE (A) 05/14/2020 1440    Lab Results  Component Value Date   LABMICR See below: 01/24/2021   WBCUA None seen 01/24/2021   LABEPIT None seen 01/24/2021   MUCUS Present 01/24/2021   BACTERIA None seen 01/24/2021    Pertinent Imaging: CT stone study 01/07/2021: Images reviewed and discussed with the patient  No results found for this or any previous visit.  No results found for this or any previous visit.  No results found for this or any previous visit.  No results found for  this or any previous visit.  No results found for this or any previous visit.  No results found for this or any previous visit.  No results found for this or any previous visit.  Results for orders placed in visit on 01/05/21  CT RENAL STONE STUDY  Narrative CLINICAL DATA:  UTI, urinary  tract stone  EXAM: CT ABDOMEN AND PELVIS WITHOUT CONTRAST  TECHNIQUE: Multidetector CT imaging of the abdomen and pelvis was performed following the standard protocol without IV contrast.  COMPARISON:  05/14/2020  FINDINGS: Lower chest: No acute abnormality.  Hepatobiliary: No focal liver abnormality is seen. No gallstones, gallbladder wall thickening, or biliary dilatation.  Pancreas: Unremarkable.  Spleen: Unremarkable.  Adrenals/Urinary Tract: Adrenals are unremarkable. Unchanged small cyst of the left kidney. No renal calculi or hydronephrosis. Limited evaluation for pyelonephritis. No ureteral calculi. Partially distended bladder with circumferential wall thickening.  Stomach/Bowel: Stomach is within normal limits. There are postoperative changes near the gastroesophageal junction. Bowel is normal in caliber. Stool is present throughout the colon.  Vascular/Lymphatic: Minimal aortic atherosclerosis. Atherosclerotic calcification of the femoral vessels. No enlarged lymph nodes.  Reproductive: Stable appearance of enlarged prostate protruding into the base of the bladder.  Other: No ascites. Fat containing left inguinal hernia. Small bowel in proximity to the right inguinal canal without hernia.  Musculoskeletal: No acute osseous abnormality.  IMPRESSION: No urinary tract calculi.  Stable circumferential bladder wall thickening likely due to chronic outlet obstruction from enlarged prostate.   Electronically Signed By: Macy Mis M.D. On: 01/07/2021 08:41   Assessment & Plan:    1. Benign prostatic hyperplasia with urinary obstruction -Continue double  voiding - Urinalysis, Routine w reflex microscopic - Bladder Scan (Post Void Residual) in office  2. Recurrent UTI -We will start macrobid 50mg  qhs for prophylaxis. RTC 3 months   No follow-ups on file.  Nicolette Bang, MD  [facility] [place]

## 2021-02-21 ENCOUNTER — Ambulatory Visit: Payer: Medicare HMO | Admitting: Urology

## 2021-02-21 ENCOUNTER — Other Ambulatory Visit: Payer: Medicare HMO | Admitting: Urology

## 2021-02-24 ENCOUNTER — Ambulatory Visit (INDEPENDENT_AMBULATORY_CARE_PROVIDER_SITE_OTHER): Payer: Medicare (Managed Care) | Admitting: Neurology

## 2021-02-24 ENCOUNTER — Encounter (INDEPENDENT_AMBULATORY_CARE_PROVIDER_SITE_OTHER): Payer: Self-pay | Admitting: Neurology

## 2021-02-24 VITALS — BP 123/80 | HR 81 | Ht 67.0 in | Wt 169.0 lb

## 2021-02-24 DIAGNOSIS — G20 Parkinson's disease: Secondary | ICD-10-CM

## 2021-02-24 DIAGNOSIS — R270 Ataxia, unspecified: Secondary | ICD-10-CM

## 2021-02-24 DIAGNOSIS — G20A1 Parkinson's disease without dyskinesia, without mention of fluctuations: Secondary | ICD-10-CM

## 2021-02-24 DIAGNOSIS — F482 Pseudobulbar affect: Secondary | ICD-10-CM

## 2021-02-24 DIAGNOSIS — R471 Dysarthria and anarthria: Secondary | ICD-10-CM

## 2021-02-24 DIAGNOSIS — R2689 Other abnormalities of gait and mobility: Secondary | ICD-10-CM

## 2021-02-24 DIAGNOSIS — I639 Cerebral infarction, unspecified: Secondary | ICD-10-CM

## 2021-02-24 NOTE — Progress Notes (Signed)
Subjective:     80 y/o M with hx HTN, HLD, borderline DM, glaucoma, prior CVA, presents to Movement Disorders clinic for f/u of possible Parkinson's Disease.     At last visit, tried adding Gocovri, but didn't see it was an improvement. Stopped it. Unclear per the family if it helped. Tongue continues to move.  Currently taking 3 capsules 145mg  4x daily. Motor function and speech continue to be severely affected. Walking some but poor, using a wheelchair more. Completed PT last year. Legs continue to stutter when walking. No LH/D reported. No hallucinations or delusions. Still likes to stay up at night and sleep during the day, but not as often. Started melatonin, and when he takes it, it does help. On Nuedexta now, which has resolved his crying episodes. Tolerating with no issue.     History retained from initial encounter:     History is mainly from family. He was diagnosed with Parkinson's Disease in 2015, with initial symptoms being changes to his gait.  He was walking slower and his movements were slowed overall.  They report also noting tremors in the legs and hands, unclear laterality. He noticed the tremors throughout the day, but the daughter notices it more with activity, when feeding himself.     The diagnosis of Parkinson's was considered, so he was started on Sinemet, however did not take it because it gave him GI issues. During that year, he had a number of TIAs, but then in 2016 had a completed CVA.  Described a 'clot in the back of his head' by family.  Afterwards, he was having issues with speech and swallowing. Swallow study was normal.     At some point then, the dx of Parkinson's was questioned, but "tests" were completed that confirmed, though unclear what that was.  Was put back on Sinemet.  Once he is on Sinemet, his walking and tightness improves, but they are concerned about progression as he is falling frequently now. No cognitive changes, just difficulty talking.    Weak spells  happen in the morning, he tells family that he's weak. Can get to lie down, but doesn't feel dizzy, woozy or lightheaded.  He feels that he starts to feel weak after the medicine.     Last complete PT in December last year. He has completed SLT which helped.    + uncontrollable laughing and crying noted, depression/anxiety and fatigue.     DATscan reviewed (08/10/2016):    Abnormal image grade 3: Virtually absent uptake bilaterally affecting both putamen and caudate nuclei.  This pattern of activity can be seen with Parkinson's disease or related syndromes.    No family history of Parkinson's Disease or tremor. No history significant head injury or LOC.  No history of neuroleptic or chemical exposure.     Prior history reviewed:    Brian Escobar was seen today in the movement disorders clinic for neurologic consultation at the request of Brian Escobar, Brian Escobar. The consultation is for the evaluation of PD. No records are available to me from his prior neurologist. Therefore, all history was obtained from the patient and wife. He was previously being seen in [place], [place]. He was dx with with PD about 3 years ago and wife states that his first sx was was slow handwriting and slow to eat and slight jaw tremor. He was started on carbidopa/levodopa 10/100 tid and pt doesn't think that it helped. It was taking it at 8am/1pm/8-9pm. Lunchtime is at noon. He takes the  last at bedtime. However, he admits that he has not taken it for months.    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 When she got to him, he was just laying on the floor. They called 911. He was not on ASA at the time. He was put on plavix and lipitor. Speech has been biggest issue since the stroke. States that he never went through speech therapy, even when he was in rehab. He did physical therapy only. R leg remains a bit weak after stroke.    07/18/16 update: The patient is seen today in follow-up. I have reviewed  many records from several hospitalizations and also his prior neurologist records since our last visit. He was first seen by his prior neurologist in [place] on 06/15/2014 with complaints of speech changes and dragging his right leg and trouble buttoning his clothing. On examination, she reports no bradykinesia at that visit, but she reports that he had lip tremor. He was dragging his right leg and his rapid alternating movements were normal. She did an MRI of the brain just following that visit and this was done on 07/06/2014. There was a small acute right basal ganglia infarct. He followed up with her just after that in August, 2015 and was started on levodopa, 10/100 3 times per day, as she felt that his infarct was acute, whereas his symptoms were for 6 months and he had right leg symptoms and right sided infarct. 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 In September, 2015 the notes said that the patient noted significant improvement on aspirin and a statin. He was then hospitalized in May, 2016 and she followed up with him after that and said that there was just mild slurring of speech. On 07/19/2015 she did have him wearing an event monitor for one month that was negative. It does appear that he had been off of levodopa ever since his infarct in May, 2016. I was able to get his hospital records from [facility] from his infarction. He was admitted on 04/08/2015, but they reported left-sided weakness and dizziness. He had an echocardiogram on 04/09/2015 with an ejection fraction of 60-65%. An EEG was normal. An MRA of the neck was normal. An MRI of the brain stated that DWI appears to demonstrate multiple punctate areas of high signal involving the cerebellar hemispheres and cerebellar vermis. I did have the opportunity to review these images once they became available, and DWI was in fact positive in both cerebellar hemispheres and amongst the vermis. His carotid ultrasound was negative.  There were minimal notes regarding his speech but nothing stated that it was particularly slurred. He was then recently hospitalized once again more on 07/10/2016 and I again reviewed those records. He was hospitalized for inability to swallow. Pt states that swallowing issues are with liquids and not solids because he was trying to take too much in the mouth at once. An MRI without gadolinium was done. I did not get those images. There was reported to be a few old cerebellar infarctions. There is reported to be an old infarction in the left frontal region. DWI was negative. It was concluded that his swallowing issues were from Parkinsons disease and his refusal to take levodopa. He did have a barium swallow that was reported to show mild oropharyngeal dysphagia, but no dietary changes were made, with the exceptions of to take small bites and administer meds  one at a time. He ended up having a very detailed EMG at our office following these hospitalizations to rule out motor neuron disease and there was no evidence of this.    09/08/16 update: Pt returns accompanied by his wife who supplements the history. Since our last visit the patient had a DaT scan in 07/2016 and it was abnormal demonstrating decreased uptake, being virtually absent in the bilateral caudate and the putamen. Wife does state that when he was last hospitalized, he had been given levodopa and it helped but was d/c in the hospital. He also saw Dr. Johney Frame and had a TEE on 08/18/16 and that was normal with the exception of evidence of a PFO. He had a LE doppler on 08/18/16 and that was normal. He had a loop recorder implanted on 08/18/16 and as of 08/28/16 there were no episodes of atrial fibrillation.     The following portions of the patient's history were reviewed and updated as appropriate: allergies, current medications, past medical history, past social history, past surgical history and problem list.    Review of Systems  + vocal changes, gait  disorder.  No recent illness. Denies fever, chills, cough, sinus pain, eye pain, eye redness, ear pain, rhinorrhea, sore throat, chest pain, SOB, wheezing, abd pain, Nausea, Vomiting, diarrhea, constipation, dysuria, or rashes.  All other systems reviewed and are negative except as previously noted in the HPI.    Objective:     Height 13ft7, Weight 160lb  Constitutional: NAD   Eyes: Clear conjunctiva  Musculoskeletal: stooped posture, normal muscle bulk.  Integumentary: No abnormal rash noted  Psych: Flat affect, decreased blink rate. No more emotionality noted.     Neurological:   Language: Dysarthric. Difficult to understand.      Cranial Nerves:  III, IV, VI: Mild limitation to vertical gaze, No nystagmus, no palsies, no ptosis  V: Intact to LT V1-V3 distribution bilaterally.   VI: Symmetrical face and expression.   VIII: Hearing intact to finger rub bilaterally.     Motor Exam: R hand/UE weakness from prior CVA. Intrinsic hand muscle atrophy noted bilaterally, R>L, with extension/flexion spasticity/dystonia in both hands.      Sensation: Sensation reported intact to LT grossly through symmetrically.     Cerebellar:   Finger to Nose: intact bilaterally with mild terminal dysmetria on FTN.    Reflexes: 1+ symmetric.     Station/ Gait: Wheelchair bound today.     Fine motor use of both hands is slowed to RAM, finger tapping and open/close, R>L but more fluid with no freezing.  Foot tapping also reduced in this laterality.     Assessment:     80 y/o M with hx HTN, HLD, borderline DM, glaucoma, prior CVA, presents to Movement Disorders clinic for f/u of Parkinson's Disease. 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 It is unclear what symptoms are CVA related and which are PD related, though RUE issues appear now more CVA in origin.      Continues to progress, with more global  motor dysfunction which is unresponsive to increase in levodopa. Increased Rytary doses induce supratherapeutic orolingual dyskinesia. Will hold here, and stressed need to return to PT/OT when able. Sleep is an issue still, with day/night reversal. Suggested melatonin nightly and if not better, can add a low dose of clonazepam.     Plan:  1) Parkinson's Disease -   - Continue Rytary at 3 capsules 145mg  4x daily, 5 hours apart.  - Continue Nourianz at 20mg  daily. Did not tolerate 40mg  daily.   - Cannot have MAO-B inhibitors d/t Nuedexta use. Did not show response to amantadine IR or Gocovri.     2) Gait disorder - multifactorial; PD, comorbid medical issues, prior CVA.   - Restart PT/OT. HOME rehab services of medical necessity d/t inability to travel safely, homebound status.   - Referred to home health for services.  - Discussed fall risk at length.     3) Dysarthria -   - Restart SLT.    4) Prior CVA - continue Plavix and statin for secondary stroke prevention.  - screening as above.     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     RTC in 4 mo in person. Patient can follow up sooner if needed. In the meantime, patient will contact the office with any questions or concerns.     -----------------------------------------------------------    Approximately 30 minutes were spent on the day of service including face-to-face time with patient, coordinating care, record review and documentation.    Kyanna Mahrt "Johnston Ebbs, Brian Escobar  Director, Movement Disorders Specialist   [facility]  [facility]    [address] [place]  [address]  [place] [address]    [place], [place] [address]  T [phone]   F [phone]   T [phone]   F [phone]     [email or web address]

## 2021-04-13 ENCOUNTER — Telehealth: Payer: Self-pay

## 2021-04-13 NOTE — Telephone Encounter (Signed)
Wife calling for patient. Wanting to drop off urine specimen on original 04-26-2021.  Make sure he is clear from infection. Has transportation set up from appt.  Please advise.  Call back:   [phone]   Thanks, Helene Kelp

## 2021-04-13 NOTE — Telephone Encounter (Signed)
Returned call, wife wants patient to come do a urine drop off to check urine for infection on original Cysto day 04/26/21 that was resheduled. Wife made aware that patient will be placed on lab schedule for urine drop off.

## 2021-04-26 ENCOUNTER — Other Ambulatory Visit: Payer: Medicare HMO

## 2021-04-26 ENCOUNTER — Other Ambulatory Visit: Payer: Medicare HMO | Admitting: Urology

## 2021-04-26 ENCOUNTER — Other Ambulatory Visit: Payer: Self-pay

## 2021-04-26 DIAGNOSIS — N390 Urinary tract infection, site not specified: Secondary | ICD-10-CM

## 2021-04-26 LAB — URINALYSIS, ROUTINE W REFLEX MICROSCOPIC
Bilirubin, UA: NEGATIVE
Glucose, UA: NEGATIVE
Nitrite, UA: POSITIVE — AB
Protein,UA: NEGATIVE
Specific Gravity, UA: 1.02 (ref 1.005–1.030)
Urobilinogen, Ur: 0.2 mg/dL (ref 0.2–1.0)
pH, UA: 5.5 (ref 5.0–7.5)

## 2021-04-26 LAB — MICROSCOPIC EXAMINATION
Renal Epithel, UA: NONE SEEN /hpf
WBC, UA: >30 /hpf — AB (ref 0–5)

## 2021-04-30 LAB — URINE CULTURE

## 2021-05-04 ENCOUNTER — Telehealth: Payer: Self-pay

## 2021-05-04 DIAGNOSIS — N390 Urinary tract infection, site not specified: Secondary | ICD-10-CM

## 2021-05-04 MED ORDER — AMOXICILLIN-POT CLAVULANATE 875-125 MG PO TABS: 1.0000 | ORAL_TABLET | Freq: Two times a day (BID) | ORAL | 0 refills | Status: DC

## 2021-05-04 NOTE — Telephone Encounter (Signed)
-----   Message from Cleon Gustin, MD sent at 05/04/2021  2:55 PM EDT ----- Augemntin 875 BID for 7 days ----- Message ----- From: Dorisann Frames, RN Sent: 05/02/2021   7:34 AM EDT To: Cleon Gustin, MD  Please review

## 2021-05-04 NOTE — Telephone Encounter (Signed)
Prescription sent to pharmacy. Wife notified.   Patient will hold macrodantin hs while on augmentin  Wife notified.

## 2021-05-26 ENCOUNTER — Other Ambulatory Visit (INDEPENDENT_AMBULATORY_CARE_PROVIDER_SITE_OTHER): Payer: Self-pay | Admitting: Neurology

## 2021-05-26 DIAGNOSIS — G20 Parkinson's disease: Secondary | ICD-10-CM

## 2021-05-26 DIAGNOSIS — R471 Dysarthria and anarthria: Secondary | ICD-10-CM

## 2021-05-27 ENCOUNTER — Telehealth: Payer: Self-pay

## 2021-05-27 NOTE — Telephone Encounter (Signed)
Nourianz 20mg  tablets-    Received refill request via [facility]. Ran test claim via [employer] for 30/30. Received $0.00 copay. Pt able to fill with [facility].

## 2021-06-07 ENCOUNTER — Telehealth: Payer: Self-pay

## 2021-06-07 ENCOUNTER — Other Ambulatory Visit: Payer: Medicaid Other

## 2021-06-08 ENCOUNTER — Other Ambulatory Visit: Payer: Medicaid Other

## 2021-06-08 ENCOUNTER — Other Ambulatory Visit: Payer: Self-pay

## 2021-06-08 DIAGNOSIS — N390 Urinary tract infection, site not specified: Secondary | ICD-10-CM

## 2021-06-08 NOTE — Telephone Encounter (Signed)
Opened in error

## 2021-06-16 ENCOUNTER — Other Ambulatory Visit: Payer: Self-pay

## 2021-06-16 ENCOUNTER — Other Ambulatory Visit: Payer: Medicaid Other

## 2021-06-16 DIAGNOSIS — N390 Urinary tract infection, site not specified: Secondary | ICD-10-CM

## 2021-06-16 LAB — URINALYSIS, ROUTINE W REFLEX MICROSCOPIC
Bilirubin, UA: NEGATIVE
Glucose, UA: NEGATIVE
Ketones, UA: NEGATIVE
Nitrite, UA: NEGATIVE
Protein,UA: NEGATIVE
Specific Gravity, UA: 1.025 (ref 1.005–1.030)
Urobilinogen, Ur: 0.2 mg/dL (ref 0.2–1.0)
pH, UA: 5 (ref 5.0–7.5)

## 2021-06-16 LAB — MICROSCOPIC EXAMINATION
Renal Epithel, UA: NONE SEEN /hpf
WBC, UA: >30 /hpf — AB (ref 0–5)

## 2021-06-16 NOTE — Addendum Note (Signed)
Addended by: Pollyann Kennedy F on: 06/16/2021 01:48 PM   Modules accepted: Orders

## 2021-06-19 LAB — URINE CULTURE

## 2021-06-20 ENCOUNTER — Other Ambulatory Visit: Payer: Medicare HMO | Admitting: Urology

## 2021-06-20 ENCOUNTER — Telehealth: Payer: Self-pay

## 2021-06-20 NOTE — Telephone Encounter (Signed)
Patient on schedule for today for cysto. Received positive culture report- postponed appt for today. Culture report sent to Dr. Alyson Ingles.  Attempted to reschedule cysto appt. Wife will call back for another appt.  Wife reported son would like to speak with Dr. Alyson Ingles about recurrent UTI as well.

## 2021-06-21 MED ORDER — AMOXICILLIN-POT CLAVULANATE 875-125 MG PO TABS: 1.0000 | ORAL_TABLET | Freq: Two times a day (BID) | ORAL | 0 refills | Status: DC

## 2021-06-27 ENCOUNTER — Telehealth: Payer: Self-pay | Admitting: Urology

## 2021-06-27 NOTE — Telephone Encounter (Signed)
#   Mike Nixon [phone] number needed for virtual visit telephone call

## 2021-06-27 NOTE — Telephone Encounter (Signed)
Inbound call recv'd from spouse, Hoyle Sauer, requesting to speak w/nurse [facility]) rather urgently, as she has some questions & changes that need to be made to his upcoming appt.  Mrs. Hoyle Sauer did not wish to provide any further details w/in this msg, only that she needs to speak w/the nurse.  Thank you

## 2021-07-04 ENCOUNTER — Telehealth: Payer: Self-pay

## 2021-07-04 ENCOUNTER — Other Ambulatory Visit: Payer: Medicare HMO

## 2021-07-04 ENCOUNTER — Other Ambulatory Visit: Payer: Self-pay

## 2021-07-04 DIAGNOSIS — N390 Urinary tract infection, site not specified: Secondary | ICD-10-CM

## 2021-07-04 LAB — URINALYSIS, ROUTINE W REFLEX MICROSCOPIC
Bilirubin, UA: NEGATIVE
Glucose, UA: NEGATIVE
Nitrite, UA: POSITIVE — AB
Specific Gravity, UA: 1.025 (ref 1.005–1.030)
Urobilinogen, Ur: 1 mg/dL (ref 0.2–1.0)
pH, UA: 6.5 (ref 5.0–7.5)

## 2021-07-04 LAB — MICROSCOPIC EXAMINATION
Renal Epithel, UA: NONE SEEN /hpf
WBC, UA: >30 /hpf — AB (ref 0–5)

## 2021-07-04 NOTE — Telephone Encounter (Signed)
Wife called requesting to leave urine specimen to make sure husbands infection has cleared. Patient placed on lab schedule for urine drop off.

## 2021-07-05 ENCOUNTER — Telehealth (INDEPENDENT_AMBULATORY_CARE_PROVIDER_SITE_OTHER): Payer: Medicare HMO | Admitting: Urology

## 2021-07-05 ENCOUNTER — Encounter: Payer: Self-pay | Admitting: Urology

## 2021-07-05 DIAGNOSIS — N3021 Other chronic cystitis with hematuria: Secondary | ICD-10-CM | POA: Diagnosis not present

## 2021-07-05 DIAGNOSIS — Z1612 Extended spectrum beta lactamase (ESBL) resistance: Secondary | ICD-10-CM

## 2021-07-05 DIAGNOSIS — B9629 Other Escherichia coli [E. coli] as the cause of diseases classified elsewhere: Secondary | ICD-10-CM

## 2021-07-05 DIAGNOSIS — N390 Urinary tract infection, site not specified: Secondary | ICD-10-CM

## 2021-07-05 MED ORDER — SODIUM CHLORIDE 0.9 % IV SOLN: 1.0000 g | INTRAVENOUS | 13 refills | Status: DC

## 2021-07-05 NOTE — Patient Instructions (Signed)
Urinary Tract Infection, Adult A urinary tract infection (UTI) is an infection of any part of the urinary tract. The urinary tract includes the kidneys, ureters, bladder, and urethra. These organs make, store, and get rid of urine in the body. An upper UTI affects the ureters and kidneys. A lower UTI affects the bladder and urethra. What are the causes? Most urinary tract infections are caused by bacteria in your genital area around your urethra, where urine leaves your body. These bacteria grow and cause inflammation of your urinary tract. What increases the risk? You are more likely to develop this condition if: You have a urinary catheter that stays in place. You are not able to control when you urinate or have a bowel movement (incontinence). You are male and you: Use a spermicide or diaphragm for birth control. Have low estrogen levels. Are pregnant. You have certain genes that increase your risk. You are sexually active. You take antibiotic medicines. You have a condition that causes your flow of urine to slow down, such as: An enlarged prostate, if you are male. Blockage in your urethra. A kidney stone. A nerve condition that affects your bladder control (neurogenic bladder). Not getting enough to drink, or not urinating often. You have certain medical conditions, such as: Diabetes. A weak disease-fighting system (immunesystem). Sickle cell disease. Gout. Spinal cord injury. What are the signs or symptoms? Symptoms of this condition include: Needing to urinate right away (urgency). Frequent urination. This may include small amounts of urine each time you urinate. Pain or burning with urination. Blood in the urine. Urine that smells bad or unusual. Trouble urinating. Cloudy urine. Vaginal discharge, if you are male. Pain in the abdomen or the lower back. You may also have: Vomiting or a decreased appetite. Confusion. Irritability or tiredness. A fever or  chills. Diarrhea. The first symptom in older adults may be confusion. In some cases, they may not have any symptoms until the infection has worsened. How is this diagnosed? This condition is diagnosed based on your medical history and a physical exam. You may also have other tests, including: Urine tests. Blood tests. Tests for STIs (sexually transmitted infections). If you have had more than one UTI, a cystoscopy or imaging studies may be done to determine the cause of the infections. How is this treated? Treatment for this condition includes: Antibiotic medicine. Over-the-counter medicines to treat discomfort. Drinking enough water to stay hydrated. If you have frequent infections or have other conditions such as a kidney stone, you may need to see a health care provider who specializes in the urinary tract (urologist). In rare cases, urinary tract infections can cause sepsis. Sepsis is a life-threatening condition that occurs when the body responds to an infection. Sepsis is treated in the hospital with IV antibiotics, fluids, and other medicines. Follow these instructions at home: Medicines Take over-the-counter and prescription medicines only as told by your health care provider. If you were prescribed an antibiotic medicine, take it as told by your health care provider. Do not stop using the antibiotic even if you start to feel better. General instructions Make sure you: Empty your bladder often and completely. Do not hold urine for long periods of time. Empty your bladder after sex. Wipe from front to back after urinating or having a bowel movement if you are male. Use each tissue only one time when you wipe. Drink enough fluid to keep your urine pale yellow. Keep all follow-up visits. This is important. Contact a health care provider   if: Your symptoms do not get better after 1-2 days. Your symptoms go away and then return. Get help right away if: You have severe pain in your  back or your lower abdomen. You have a fever or chills. You have nausea or vomiting. Summary A urinary tract infection (UTI) is an infection of any part of the urinary tract, which includes the kidneys, ureters, bladder, and urethra. Most urinary tract infections are caused by bacteria in your genital area. Treatment for this condition often includes antibiotic medicines. If you were prescribed an antibiotic medicine, take it as told by your health care provider. Do not stop using the antibiotic even if you start to feel better. Keep all follow-up visits. This is important. This information is not intended to replace advice given to you by your health care provider. Make sure you discuss any questions you have with your health care provider. Document Revised: 06/25/2020 Document Reviewed: 06/25/2020 Elsevier Patient Education  2022 [employer]  

## 2021-07-05 NOTE — Progress Notes (Signed)
07/05/2021 4:43 PM   [name] [birth year] [id number]  Referring provider: [name], MD [place],  [place] [id number]  Patient location: home Physician location: office I connected with  Aviv Flannelly on 07/05/21 by a video enabled telemedicine application and verified that I am speaking with the correct person using two identifiers.   I discussed the limitations of evaluation and management by telemedicine. The patient expressed understanding and agreed to proceed.    Recurrent UTI  HPI: Mr Cindrich is a 80yo here for followup for recurrent UTI. He has had multiple ESBL E coli UTIs in the past 3 months that have been resistant to oral therapy. He has a urine culture pending.    PMH: Past Medical History:  Diagnosis Date   Anticoagulant long-term use    PLAVIX   Benign localized prostatic hyperplasia with lower urinary tract symptoms (LUTS)    Bladder tumor    Borderline diabetes    watches diet   ED (erectile dysfunction) of organic origin    Gait disturbance, post-stroke    uses wheelchair long distance   Glaucoma, right eye    History of bleeding peptic ulcer    03/ 1979  s/p  surgery   History of concussion    1987 no loc-- no residual   History of CVA with residual deficit    05/ 2016 CYPTOGENIC CVA --- RESIDUAL GAIT DISTURBANCE AND SIALORRHEA ACCESS (DROOLING)   Hyperlipidemia    Hypertension    Mild atherosclerosis of both carotid arteries    per duplex from outside facitlity 04-08-2015 less then 50% stenosis bilateral ICA   Parkinson's disease [facility]) neurologist -- dr Alfonso Patten. andrew falconer ([facility], [place] [facility])   dx 2015   PBA (pseudobulbar affect)    laughter-- residual from cva 05/ 2016   PFO (patent foramen ovale)    per TEE 08-18-2016   Speech impairment    post-stroke   Status post placement of implantable loop recorder 08/18/2016   placed by dr allred   Wears glasses     Surgical History: Past Surgical History:   Procedure Laterality Date   APPENDECTOMY  1978   CATARACT EXTRACTION W/ INTRAOCULAR LENS IMPLANT Right 2014 approx.   CYSTOSCOPY W/ URETERAL STENT PLACEMENT Bilateral 05/07/2017   Procedure: CYSTOSCOPY WITH RETROGRADE PYELOGRAM/URETERAL;  Surgeon: Cleon Gustin, MD;  Location: [facility];  Service: Urology;  Laterality: Bilateral;   EP IMPLANTABLE DEVICE N/A 08/18/2016   Procedure: Loop Recorder Insertion;  Surgeon: Thompson Grayer, MD;  Location: [facility];  Service: Cardiovascular;  Laterality: N/A;   implantable loop recorder removal  12/27/2020   LINQ removed for EOS   REMOVAL NODULES BILATERAL FLANK  2008 approx.   per pt benign fatty tissue   REPAIR OF PERFORATED ULCER  01/1978   bleeding peptic ulcer   TEE WITHOUT CARDIOVERSION N/A 08/18/2016   Procedure: TRANSESOPHAGEAL ECHOCARDIOGRAM (TEE);  Surgeon: Sueanne Margarita, MD;  Location: [facility];  Service: Cardiovascular;  Laterality: N/A; no evidence thrombus;  mobile atrial septum w/ positive bubble study consistent with PFO/ trivial AR, mild TR   TRANSTHORACIC ECHOCARDIOGRAM  04/09/2015   ef [id number], grade 1 diastolic dysfunction/  mild AV sclerosis without stenosis/  mild TR,  trivial MR and PR   TRANSURETHRAL RESECTION OF BLADDER TUMOR N/A 05/07/2017   Procedure: TRANSURETHRAL RESECTION OF BLADDER TUMOR (TURBT);  Surgeon: Cleon Gustin, MD;  Location: [facility];  Service: Urology;  Laterality: N/A;   TRANSURETHRAL RESECTION OF BLADDER TUMOR N/A 06/18/2017   Procedure: TRANSURETHRAL RESECTION OF BLADDER TUMOR (TURBT);  Surgeon: Cleon Gustin, MD;  Location: [facility];  Service: Urology;  Laterality: N/A;   TRANSURETHRAL RESECTION OF BLADDER TUMOR N/A 09/11/2018   Procedure: TRANSURETHRAL RESECTION OF BLADDER TUMOR (TURBT);  Surgeon: Cleon Gustin, MD;  Location: [facility];  Service: Urology;  Laterality: N/A;   TRANSURETHRAL RESECTION OF PROSTATE  08-20-2013   at [facility]  Medications:  Allergies as of 07/05/2021       Reactions   Benazepril Cough   Doxycycline Other (See Comments)   Unknown   Levofloxacin Hives, Swelling   Simvastatin Other (See Comments)   Pt does not remember what the reaction was. ?muscle aches   Tamsulosin Hives, Swelling   Tramadol Hcl Hives, Swelling   Sulfa Antibiotics Hives, Swelling, Rash, Other (See Comments)   Severe rash        Medication List        Accurate as of July 05, 2021  4:43 PM. If you have any questions, ask your nurse or doctor.          acetaminophen 500 MG tablet Commonly known as: TYLENOL Take 1,000 mg by mouth every 6 (six) hours as needed for mild pain or headache.   amoxicillin-clavulanate 875-125 MG tablet Commonly known as: AUGMENTIN Take 1 tablet by mouth every 12 (twelve) hours.   amoxicillin-clavulanate 875-125 MG tablet Commonly known as: AUGMENTIN Take 1 tablet by mouth 2 (two) times daily.   atorvastatin 40 MG tablet Commonly known as: LIPITOR Take 40 mg by mouth every evening.   Carbidopa-Levodopa ER 36.25-145 MG Cpcr Take 3 capsules by mouth 4 (four) times daily.   Rytary 48.75-195 MG Cpcr Generic drug: Carbidopa-Levodopa ER Take by mouth.   chlorhexidine 0.12 % solution Commonly known as: PERIDEX 15 mLs by Mouth Rinse route 2 (two) times daily.   clopidogrel 75 MG tablet Commonly known as: PLAVIX Take 1 tablet (75 mg total) by mouth daily.   fosfomycin 3 g Pack Commonly known as: Monurol Take 1 packet once then again two days.   furosemide 20 MG tablet Commonly known as: LASIX Take 1 tablet (20 mg total) by mouth as needed. What changed:  when to take this reasons to take this   hydrochlorothiazide 25 MG tablet Commonly known as: HYDRODIURIL Take 25 mg by mouth daily.   HYDROcodone-acetaminophen 5-325 MG tablet Commonly known as: NORCO/VICODIN Take by mouth.   meclizine 25 MG tablet Commonly known as: ANTIVERT Take 25 mg by mouth 3 (three) times  daily as needed for dizziness.   metroNIDAZOLE 500 MG tablet Commonly known as: FLAGYL Take 500 mg by mouth 2 (two) times daily.   nitrofurantoin 50 MG capsule Commonly known as: MACRODANTIN Take 1 capsule (50 mg total) by mouth at bedtime.   Nourianz 40 MG Tabs Generic drug: Istradefylline Take 1 tablet by mouth daily.   Istradefylline 20 MG Tabs Take by mouth.   Nuedexta 20-10 MG capsule Generic drug: Dextromethorphan-quiNIDine Take 1 capsule by mouth 2 (two) times daily.   pantoprazole 40 MG tablet Commonly known as: PROTONIX Take 1 tablet by mouth daily.   polyethylene glycol powder 17 GM/SCOOP powder Commonly known as: GLYCOLAX/MIRALAX Take 17 g by mouth daily.   sodium phosphate 7-19 GM/118ML Enem Place 133 mLs (1 enema total) rectally daily as needed for severe constipation.   Toviaz 8 MG Tb24 tablet Generic drug: fesoterodine Take  1 tablet (8 mg total) by mouth daily.   Travoprost (BAK Free) 0.004 % Soln ophthalmic solution Commonly known as: TRAVATAN Place 1 drop into both eyes at bedtime.        Allergies:  Allergies  Allergen Reactions   Benazepril Cough   Doxycycline Other (See Comments)    Unknown   Levofloxacin Hives and Swelling   Simvastatin Other (See Comments)    Pt does not remember what the reaction was. ?muscle aches   Tamsulosin Hives and Swelling   Tramadol Hcl Hives and Swelling   Sulfa Antibiotics Hives, Swelling, Rash and Other (See Comments)    Severe rash    Family History: Family History  Problem Relation Age of Onset   Stroke Sister     Social History:  reports that he has never smoked. He has never used smokeless tobacco. He reports that he does not drink alcohol and does not use drugs.  ROS: All other review of systems were reviewed and are negative except what is noted above in HPI   Laboratory Data: Lab Results  Component Value Date   WBC 5.7 05/14/2020   HGB 14.3 05/14/2020   HCT 46.1 05/14/2020   MCV 82.9  05/14/2020   PLT 232 05/14/2020    Lab Results  Component Value Date   CREATININE 1.52 (H) 05/14/2020    No results found for: PSA  No results found for: TESTOSTERONE  Lab Results  Component Value Date   HGBA1C 6.4 (H) 06/12/2017    Urinalysis    Component Value Date/Time   COLORURINE YELLOW 05/14/2020 1440   APPEARANCEUR Cloudy (A) 07/04/2021 1127   LABSPEC 1.016 05/14/2020 1440   PHURINE 5.0 05/14/2020 1440   GLUCOSEU Negative 07/04/2021 1127   HGBUR NEGATIVE 05/14/2020 1440   BILIRUBINUR Negative 07/04/2021 1127   KETONESUR NEGATIVE 05/14/2020 1440   PROTEINUR 1+ (A) 07/04/2021 1127   PROTEINUR NEGATIVE 05/14/2020 1440   UROBILINOGEN negative (A) 03/24/2020 1519   NITRITE Positive (A) 07/04/2021 1127   NITRITE NEGATIVE 05/14/2020 1440   LEUKOCYTESUR 3+ (A) 07/04/2021 1127   LEUKOCYTESUR TRACE (A) 05/14/2020 1440    Lab Results  Component Value Date   LABMICR See below: 07/04/2021   WBCUA >30 (A) 07/04/2021   LABEPIT 0-10 07/04/2021   MUCUS Present 07/04/2021   BACTERIA Many (A) 07/04/2021    Pertinent Imaging:  No results found for this or any previous visit.  No results found for this or any previous visit.  No results found for this or any previous visit.  No results found for this or any previous visit.  No results found for this or any previous visit.  No results found for this or any previous visit.  No results found for this or any previous visit.  Results for orders placed in visit on 01/05/21  CT RENAL STONE STUDY  Narrative CLINICAL DATA:  UTI, urinary tract stone  EXAM: CT ABDOMEN AND PELVIS WITHOUT CONTRAST  TECHNIQUE: Multidetector CT imaging of the abdomen and pelvis was performed following the standard protocol without IV contrast.  COMPARISON:  05/14/2020  FINDINGS: Lower chest: No acute abnormality.  Hepatobiliary: No focal liver abnormality is seen. No gallstones, gallbladder wall thickening, or biliary  dilatation.  Pancreas: Unremarkable.  Spleen: Unremarkable.  Adrenals/Urinary Tract: Adrenals are unremarkable. Unchanged small cyst of the left kidney. No renal calculi or hydronephrosis. Limited evaluation for pyelonephritis. No ureteral calculi. Partially distended bladder with circumferential wall thickening.  Stomach/Bowel: Stomach is within normal limits. There are  postoperative changes near the gastroesophageal junction. Bowel is normal in caliber. Stool is present throughout the colon.  Vascular/Lymphatic: Minimal aortic atherosclerosis. Atherosclerotic calcification of the femoral vessels. No enlarged lymph nodes.  Reproductive: Stable appearance of enlarged prostate protruding into the base of the bladder.  Other: No ascites. Fat containing left inguinal hernia. Small bowel in proximity to the right inguinal canal without hernia.  Musculoskeletal: No acute osseous abnormality.  IMPRESSION: No urinary tract calculi.  Stable circumferential bladder wall thickening likely due to chronic outlet obstruction from enlarged prostate.   Electronically Signed By: Macy Mis M.D. On: 01/07/2021 08:41   Assessment & Plan:    1. Chronic cystitis with hematuria -We schedule PICC line and IV ertapenum   No follow-ups on file.  Nicolette Bang, MD  [facility] [place]

## 2021-07-06 ENCOUNTER — Other Ambulatory Visit: Payer: Self-pay | Admitting: Internal Medicine

## 2021-07-06 ENCOUNTER — Telehealth: Payer: Self-pay

## 2021-07-06 ENCOUNTER — Other Ambulatory Visit: Payer: Self-pay

## 2021-07-06 ENCOUNTER — Ambulatory Visit (INDEPENDENT_AMBULATORY_CARE_PROVIDER_SITE_OTHER): Payer: Medicare (Managed Care) | Admitting: Neurology

## 2021-07-06 ENCOUNTER — Encounter (INDEPENDENT_AMBULATORY_CARE_PROVIDER_SITE_OTHER): Payer: Self-pay | Admitting: Neurology

## 2021-07-06 VITALS — BP 149/83 | HR 68 | Ht 67.0 in | Wt 162.0 lb

## 2021-07-06 DIAGNOSIS — F482 Pseudobulbar affect: Secondary | ICD-10-CM

## 2021-07-06 DIAGNOSIS — G20 Parkinson's disease: Secondary | ICD-10-CM

## 2021-07-06 DIAGNOSIS — R2689 Other abnormalities of gait and mobility: Secondary | ICD-10-CM

## 2021-07-06 DIAGNOSIS — I639 Cerebral infarction, unspecified: Secondary | ICD-10-CM

## 2021-07-06 DIAGNOSIS — R4189 Other symptoms and signs involving cognitive functions and awareness: Secondary | ICD-10-CM

## 2021-07-06 DIAGNOSIS — R471 Dysarthria and anarthria: Secondary | ICD-10-CM

## 2021-07-06 DIAGNOSIS — R270 Ataxia, unspecified: Secondary | ICD-10-CM

## 2021-07-06 MED ORDER — RIVASTIGMINE 4.6 MG/24HR TD PT24
1.0000 | MEDICATED_PATCH | Freq: Every day | TRANSDERMAL | 3 refills | Status: AC
Start: 2021-07-06 — End: 2021-10-04

## 2021-07-06 MED ORDER — SODIUM CHLORIDE 0.9 % IV SOLN: 1.0000 g | INTRAVENOUS | 13 refills | Status: DC

## 2021-07-06 NOTE — Patient Instructions (Signed)
Our plan:     1) Begin rivastigmine patch at 4.6mg  daily.     2) Begin physical therapy and speech therapy.     Today's Visit:      In today's visit I reviewed your medications and records relating your health - prior testing, blood work, reports of other health care providers present in your electronic medical record.     If you have records from non-[facility] doctors please send to [place] or bring to next office visit.     A copy of today's visit will be sent to your referring doctor and/or primary care doctor.    Let me know if there are things we could have done better for your office visit.    Patient satisfaction survey:      If you receive a patient satisfaction survey, I would greatly appreciate it if you would complete it.     We are like a car dealer where we aim for a score of 9 or 10.  If you had an experience that was less than a 9 or 10, please let me know so we can improve. If you had a good experience today, please let us know too.  We value your feedback.     Contact me online:      Patient Portal online - Please sign up for MyChart -- this is the best way to communicate me.  There is a messaging feature which can send messages directly to my inbox.  It is the best way to communicate with me and get test results, medication refills or ask questions.     You can expect to get a response within 24-48 hours during weekdays - if you do not, resend your request and inform us we did not respond. My goal is for every question answered as soon as possible. Average response for a phone call is 3 days due to the volume we receive (which is why MyChart is preferred).    MyChart should not be used if you are having a medical emergency -- call 911 in that case.     Coupons for medication:      If you would like to see if samples or vouchers available for your medicines please consider checking online.  Most medicines have web sites where you can print coupons or vouchers for you co-pay.     For example:  AutomobileBuzz.is,  DSLRemote.se, Namendaxr.com, http://www.walker-hill.info/, Rytary.com, Vimpat.com    Thank you for trusting me with your health.      Take care,      Treesa Mccully "Johnston Ebbs, MD  Director, Movement Disorders Specialist  [facility]  [facility]    [email or web address]      [address]  [address]  [place], [address]    [place], [place] [address]  T [phone]   F [phone]   T [phone]   F [phone]

## 2021-07-06 NOTE — Progress Notes (Signed)
Subjective:     80 y/o Escobar with hx HTN, HLD, borderline DM, glaucoma, prior CVA, presents to Movement Disorders clinic for f/u of possible Parkinson's Disease.     Since last visit, didn't start PT/OT. Still sleeping a lot during the day, but staying up at night.     At prior visit, tried adding Gocovri, but didn't see it was an improvement. Stopped it. Unclear per the family if it helped.  Falling a lot, as he is continuing to walk. Has a motorized scooter.  Continues to have frequent UTIs which is 'wearing him out.' Going for a PICC line and infusion antibiotics.     Family is noting more cognitive impairment. No LH/D reported. No hallucinations or delusions. Still likes to stay up at night and sleep during the day, but not as often. Started melatonin, and when he takes it, it does help. On Nuedexta now, which has resolved his crying episodes. Tolerating with no issue.     History retained from initial encounter:     History is mainly from family. He was diagnosed with Parkinson's Disease in 2015, with initial symptoms being changes to his gait.  He was walking slower and his movements were slowed overall.  They report also noting tremors in the legs and hands, unclear laterality. He noticed the tremors throughout the day, but the daughter notices it more with activity, when feeding himself.     The diagnosis of Parkinson's was considered, so he was started on Sinemet, however did not take it because it gave him GI issues. During that year, he had a number of TIAs, but then in 2016 had a completed CVA.  Described a 'clot in the back of his head' by family.  Afterwards, he was having issues with speech and swallowing. Swallow study was normal.     At some point then, the dx of Parkinson's was questioned, but "tests" were completed that confirmed, though unclear what that was.  Was put back on Sinemet.  Once he is on Sinemet, his walking and tightness improves, but they are concerned about progression as he is  falling frequently now. No cognitive changes, just difficulty talking.    Weak spells happen in the morning, he tells family that he's weak. Can get to lie down, but doesn't feel dizzy, woozy or lightheaded.  He feels that he starts to feel weak after the medicine.     Last complete PT in December last year. He has completed SLT which helped.    + uncontrollable laughing and crying noted, depression/anxiety and fatigue.      DATscan reviewed (08/10/2016):    Abnormal image grade 3: Virtually absent uptake bilaterally affecting both putamen and caudate nuclei.  This pattern of activity can be seen with Parkinson's disease or related syndromes.    No family history of Parkinson's Disease or tremor. No history significant head injury or LOC.  No history of neuroleptic or chemical exposure.     Prior history reviewed:    Brian Escobar was seen today in the movement disorders clinic for neurologic consultation at the request of Brian Pandy, MD. The consultation is for the evaluation of PD. No records are available to me from his prior neurologist. Therefore, all history was obtained from the patient and wife. He was previously being seen in [place], [place]. He was dx with with PD about 3 years ago and wife states that his first sx was was slow handwriting and slow to eat and slight jaw tremor.  He was started on carbidopa/levodopa 10/100 tid and pt doesn't think that it helped. It was taking it at 8am/1pm/8-9pm. Lunchtime is at noon. He takes the last at bedtime. However, he admits that he has not taken it for months.    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 When she got to him, he was just laying on the floor. They called 911. He was not on ASA at the time. He was put on plavix and lipitor. Speech has been biggest issue since the stroke. States that he never went through speech therapy, even when he was in rehab. He did physical therapy only. R leg remains a bit weak  after stroke.    07/18/16 update: The patient is seen today in follow-up. I have reviewed many records from several hospitalizations and also his prior neurologist records since our last visit. He was first seen by his prior neurologist in [place] on 06/15/2014 with complaints of speech changes and dragging his right leg and trouble buttoning his clothing. On examination, she reports no bradykinesia at that visit, but she reports that he had lip tremor. He was dragging his right leg and his rapid alternating movements were normal. She did an MRI of the brain just following that visit and this was done on 07/06/2014. There was a small acute right basal ganglia infarct. He followed up with her just after that in August, 2015 and was started on levodopa, 10/100 3 times per day, as she felt that his infarct was acute, whereas his symptoms were for 6 months and he had right leg symptoms and right sided infarct. 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 In September, 2015 the notes said that the patient noted significant improvement on aspirin and a statin. He was then hospitalized in May, 2016 and she followed up with him after that and said that there was just mild slurring of speech. On 07/19/2015 she did have him wearing an event monitor for one month that was negative. It does appear that he had been off of levodopa ever since his infarct in May, 2016. I was able to get his hospital records from [facility] from his infarction. He was admitted on 04/08/2015, but they reported left-sided weakness and dizziness. He had an echocardiogram on 04/09/2015 with an ejection fraction of 60-65%. An EEG was normal. An MRA of the neck was normal. An MRI of the brain stated that DWI appears to demonstrate multiple punctate areas of high signal involving the cerebellar hemispheres and cerebellar vermis. I did have the opportunity to review these images once they became available, and DWI was in fact positive in  both cerebellar hemispheres and amongst the vermis. His carotid ultrasound was negative. There were minimal notes regarding his speech but nothing stated that it was particularly slurred. He was then recently hospitalized once again more on 07/10/2016 and I again reviewed those records. He was hospitalized for inability to swallow. Pt states that swallowing issues are with liquids and not solids because he was trying to take too much in the mouth at once. An MRI without gadolinium was done. I did not get those images. There was reported to be a few old cerebellar infarctions. There is reported to be an old infarction in the left frontal region. DWI was negative. It was concluded that his swallowing issues were from Parkinsons disease and his refusal to take levodopa. He did have a  barium swallow that was reported to show mild oropharyngeal dysphagia, but no dietary changes were made, with the exceptions of to take small bites and administer meds one at a time. He ended up having a very detailed EMG at our office following these hospitalizations to rule out motor neuron disease and there was no evidence of this.    09/08/16 update: Pt returns accompanied by his wife who supplements the history. Since our last visit the patient had a DaT scan in 07/2016 and it was abnormal demonstrating decreased uptake, being virtually absent in the bilateral caudate and the putamen. Wife does state that when he was last hospitalized, he had been given levodopa and it helped but was d/c in the hospital. He also saw Dr. Johney Frame and had a TEE on 08/18/16 and that was normal with the exception of evidence of a PFO. He had a LE doppler on 08/18/16 and that was normal. He had a loop recorder implanted on 08/18/16 and as of 08/28/16 there were no episodes of atrial fibrillation.     The following portions of the patient's history were reviewed and updated as appropriate: allergies, current medications, past medical history, past social  history, past surgical history and problem list.    Review of Systems  + vocal changes, gait disorder.  No recent illness. Denies fever, chills, cough, sinus pain, eye pain, eye redness, ear pain, rhinorrhea, sore throat, chest pain, SOB, wheezing, abd pain, Nausea, Vomiting, diarrhea, constipation, dysuria, or rashes.  All other systems reviewed and are negative except as previously noted in the HPI.    Objective:     Height 40ft7, Weight 160lb  Constitutional: NAD   Eyes: Clear conjunctiva  Musculoskeletal: stooped posture, normal muscle bulk.  Integumentary: No abnormal rash noted  Psych: Flat affect, decreased blink rate. No more emotionality noted.     Neurological:   Language: Dysarthric. Difficult to understand.      Cranial Nerves:  III, IV, VI: Mild limitation to vertical gaze, No nystagmus, no palsies, no ptosis  V: Intact to LT V1-V3 distribution bilaterally.   VI: Symmetrical face and expression.   VIII: Hearing intact to finger rub bilaterally.     Motor Exam: R hand/UE weakness from prior CVA. Intrinsic hand muscle atrophy noted bilaterally, R>L, with extension/flexion spasticity/dystonia in both hands.      Sensation: Sensation reported intact to LT grossly through symmetrically.     Cerebellar:   Finger to Nose: intact bilaterally with mild terminal dysmetria on FTN.    Reflexes: 1+ symmetric.     Station/ Gait: Wheelchair bound today.     Fine motor use of both hands is slowed to RAM, finger tapping and open/close, R>L but more fluid with no freezing.  Foot tapping also reduced in this laterality.     Assessment:     Brian Escobar with hx HTN, HLD, borderline DM, glaucoma, prior CVA, presents to Movement Disorders clinic for f/u of Parkinson's Disease. 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 It is unclear what symptoms are CVA related and which are PD related,  though RUE issues appear now more CVA in origin.      Continues to progress, with more global motor dysfunction which is unresponsive to increase in levodopa. Increased Rytary doses induce supratherapeutic orolingual dyskinesia. Will hold here, and stressed need to return to PT/OT when able. Sleep is  an issue still, with day/night reversal. Suggested melatonin nightly and if not better, can add a low dose of clonazepam.  Will also begin a trial of rivastigmine patch at 4.6mg  daily, with hopes of giving his cognitive system a boost to impact any one of his issues. Discussed dosing, treatment expectations and s/e of which to be aware. Answered all questions regarding this medication.    Plan:     Parkinson's Disease -   - Continue Rytary at 3 capsules 145mg  4x daily, 5 hours apart.  - Continue Nourianz at 20mg  daily. Did not tolerate 40mg  daily.   - Cannot have MAO-B inhibitors d/t Nuedexta use. Did not show response to amantadine IR or Gocovri.   - Begin rivastigmine patch at 4.6mg  daily.     Gait disorder - multifactorial; PD, comorbid medical issues, prior CVA.   - Restart PT/OT. HOME rehab services of medical necessity d/t inability to travel safely, homebound status.   - Referred to home health for services.  - Discussed fall risk at length.     Dysarthria -   - Restart SLT.    Prior CVA - continue Plavix and statin for secondary stroke prevention.  - screening as above.     Pseudobulbar affect (PBA) - continue Nuedexta 1 capsule BID     Sleep disorder - 5-6mg  melatonin nightly and if not better, can add a low dose of clonazepam.     RTC in 4 mo in person. Patient can follow up sooner if needed. In the meantime, patient will contact the office with any questions or concerns.     -----------------------------------------------------------    Approximately 40 minutes were spent on the day of service including face-to-face time with patient, coordinating care, record review and documentation.    Dempsey Knotek "Johnston Ebbs, MD  Director, Movement Disorders Specialist  [facility]  [facility]    [address]  [address]  [place] [address]    [place], [place] [address]  T [phone]   F [phone]   T [phone]   F [phone]     [email or web address]

## 2021-07-06 NOTE — Telephone Encounter (Signed)
Anderson Malta with [facility] called our office. Per Anderson Malta family had requested Anderson Malta reach out to our office for possibly doing in home infusion for patient IV medicine. Insurance information given to [employer]- per Anderson Malta they do not participate with [facility] for home IV infusion.

## 2021-07-06 NOTE — Addendum Note (Signed)
Addended by: Dorisann Frames on: 07/06/2021 09:02 AM   Modules accepted: Orders

## 2021-07-06 NOTE — Telephone Encounter (Signed)
Received orders to set up Home Health/outpatient infusion for home IV antibiotics.  Patient does not meet criteria for local [facility] per [facility] in [place] as patient is not homebound/bed bound. Number was provided to me for out patient infusion at [place] number listed and left message for outpatient infusion center to return call to submit referral.

## 2021-07-06 NOTE — Telephone Encounter (Signed)
Spoke with [facility] outpatient and records and order faxed to [phone]  Notified patient wife of having to drive patient daily to outpatient facility- wife requested I call one more place to see if in home infusion could be provided. Called [facility] in [place], [place] and not able to provide in home infusion. Only in clinic. Wife notified voiced understanding.   Patient scheduled at [facility] for picc placement 07/07/2021

## 2021-07-07 ENCOUNTER — Ambulatory Visit (HOSPITAL_COMMUNITY)
Admission: RE | Admit: 2021-07-07 | Discharge: 2021-07-07 | Disposition: A | Discharge status: Home / Self Care | Payer: Medicare HMO | Source: Ambulatory Visit | Attending: Urology | Admitting: Urology

## 2021-07-07 ENCOUNTER — Encounter (HOSPITAL_COMMUNITY): Payer: Self-pay | Admitting: Emergency Medicine

## 2021-07-07 ENCOUNTER — Other Ambulatory Visit: Payer: Self-pay

## 2021-07-07 ENCOUNTER — Inpatient Hospital Stay (HOSPITAL_COMMUNITY)
Admission: EM | Admit: 2021-07-07 | Discharge: 2021-07-12 | DRG: 690 | Disposition: A | Discharge status: Home Health | Payer: Medicare HMO | Attending: Internal Medicine | Admitting: Internal Medicine

## 2021-07-07 ENCOUNTER — Other Ambulatory Visit: Payer: Self-pay | Admitting: Urology

## 2021-07-07 DIAGNOSIS — N3021 Other chronic cystitis with hematuria: Secondary | ICD-10-CM

## 2021-07-07 DIAGNOSIS — B9620 Unspecified Escherichia coli [E. coli] as the cause of diseases classified elsewhere: Secondary | ICD-10-CM | POA: Diagnosis present

## 2021-07-07 DIAGNOSIS — N400 Enlarged prostate without lower urinary tract symptoms: Secondary | ICD-10-CM | POA: Diagnosis present

## 2021-07-07 DIAGNOSIS — Z20822 Contact with and (suspected) exposure to covid-19: Secondary | ICD-10-CM | POA: Diagnosis present

## 2021-07-07 DIAGNOSIS — B9629 Other Escherichia coli [E. coli] as the cause of diseases classified elsewhere: Secondary | ICD-10-CM

## 2021-07-07 DIAGNOSIS — Z1624 Resistance to multiple antibiotics: Secondary | ICD-10-CM | POA: Diagnosis present

## 2021-07-07 DIAGNOSIS — N390 Urinary tract infection, site not specified: Secondary | ICD-10-CM

## 2021-07-07 DIAGNOSIS — Z886 Allergy status to analgesic agent status: Secondary | ICD-10-CM

## 2021-07-07 DIAGNOSIS — G20 Parkinson's disease: Secondary | ICD-10-CM | POA: Diagnosis present

## 2021-07-07 DIAGNOSIS — R140 Abdominal distension (gaseous): Secondary | ICD-10-CM

## 2021-07-07 DIAGNOSIS — F0390 Unspecified dementia without behavioral disturbance: Secondary | ICD-10-CM | POA: Diagnosis present

## 2021-07-07 DIAGNOSIS — Z881 Allergy status to other antibiotic agents status: Secondary | ICD-10-CM

## 2021-07-07 DIAGNOSIS — Z1612 Extended spectrum beta lactamase (ESBL) resistance: Secondary | ICD-10-CM | POA: Insufficient documentation

## 2021-07-07 DIAGNOSIS — Z7401 Bed confinement status: Secondary | ICD-10-CM

## 2021-07-07 DIAGNOSIS — R109 Unspecified abdominal pain: Secondary | ICD-10-CM

## 2021-07-07 DIAGNOSIS — R0789 Other chest pain: Secondary | ICD-10-CM | POA: Diagnosis present

## 2021-07-07 DIAGNOSIS — I69351 Hemiplegia and hemiparesis following cerebral infarction affecting right dominant side: Secondary | ICD-10-CM

## 2021-07-07 DIAGNOSIS — I10 Essential (primary) hypertension: Secondary | ICD-10-CM | POA: Diagnosis present

## 2021-07-07 DIAGNOSIS — E109 Type 1 diabetes mellitus without complications: Secondary | ICD-10-CM | POA: Diagnosis present

## 2021-07-07 DIAGNOSIS — Z7902 Long term (current) use of antithrombotics/antiplatelets: Secondary | ICD-10-CM

## 2021-07-07 DIAGNOSIS — K5900 Constipation, unspecified: Secondary | ICD-10-CM | POA: Diagnosis present

## 2021-07-07 DIAGNOSIS — H409 Unspecified glaucoma: Secondary | ICD-10-CM | POA: Diagnosis present

## 2021-07-07 DIAGNOSIS — R339 Retention of urine, unspecified: Secondary | ICD-10-CM

## 2021-07-07 DIAGNOSIS — Z888 Allergy status to other drugs, medicaments and biological substances status: Secondary | ICD-10-CM

## 2021-07-07 DIAGNOSIS — Z882 Allergy status to sulfonamides status: Secondary | ICD-10-CM

## 2021-07-07 DIAGNOSIS — N3281 Overactive bladder: Secondary | ICD-10-CM

## 2021-07-07 DIAGNOSIS — Z79899 Other long term (current) drug therapy: Secondary | ICD-10-CM

## 2021-07-07 DIAGNOSIS — R079 Chest pain, unspecified: Secondary | ICD-10-CM

## 2021-07-07 DIAGNOSIS — E785 Hyperlipidemia, unspecified: Secondary | ICD-10-CM | POA: Diagnosis present

## 2021-07-07 DIAGNOSIS — Z87820 Personal history of traumatic brain injury: Secondary | ICD-10-CM

## 2021-07-07 DIAGNOSIS — Z87440 Personal history of urinary (tract) infections: Secondary | ICD-10-CM

## 2021-07-07 DIAGNOSIS — Z823 Family history of stroke: Secondary | ICD-10-CM

## 2021-07-07 DIAGNOSIS — N3001 Acute cystitis with hematuria: Secondary | ICD-10-CM

## 2021-07-07 LAB — URINE CULTURE

## 2021-07-07 LAB — URINALYSIS, ROUTINE W REFLEX MICROSCOPIC
Bilirubin Urine: NEGATIVE
Glucose, UA: NEGATIVE mg/dL
Hgb urine dipstick: NEGATIVE
Ketones, ur: 5 mg/dL — AB
Nitrite: NEGATIVE
Protein, ur: NEGATIVE mg/dL
Specific Gravity, Urine: 1.019 (ref 1.005–1.030)
WBC, UA: >50 WBC/hpf — ABNORMAL HIGH (ref 0–5)
pH: 5 (ref 5.0–8.0)

## 2021-07-07 LAB — COMPREHENSIVE METABOLIC PANEL
ALT: 7 U/L (ref 0–44)
AST: 17 U/L (ref 15–41)
Albumin: 4.1 g/dL (ref 3.5–5.0)
Alkaline Phosphatase: 67 U/L (ref 38–126)
Anion gap: 7 (ref 5–15)
BUN: 16 mg/dL (ref 8–23)
CO2: 29 mmol/L (ref 22–32)
Calcium: 9.6 mg/dL (ref 8.9–10.3)
Chloride: 104 mmol/L (ref 98–111)
Creatinine, Ser: 1.15 mg/dL (ref 0.61–1.24)
GFR, Estimated: >60 mL/min (ref >60)
Glucose, Bld: 148 mg/dL — ABNORMAL HIGH (ref 70–99)
Potassium: 3.6 mmol/L (ref 3.5–5.1)
Sodium: 140 mmol/L (ref 135–145)
Total Bilirubin: 0.9 mg/dL (ref 0.3–1.2)
Total Protein: 6.9 g/dL (ref 6.5–8.1)

## 2021-07-07 LAB — CBC WITH DIFFERENTIAL/PLATELET
Abs Immature Granulocytes: 0.02 10*3/uL (ref 0.00–0.07)
Basophils Absolute: 0 10*3/uL (ref 0.0–0.1)
Basophils Relative: 0 %
Eosinophils Absolute: 0.2 10*3/uL (ref 0.0–0.5)
Eosinophils Relative: 4 %
HCT: 41.2 % (ref 39.0–52.0)
Hemoglobin: 12.9 g/dL — ABNORMAL LOW (ref 13.0–17.0)
Immature Granulocytes: 0 %
Lymphocytes Relative: 31 %
Lymphs Abs: 1.8 10*3/uL (ref 0.7–4.0)
MCH: 25.7 pg — ABNORMAL LOW (ref 26.0–34.0)
MCHC: 31.3 g/dL (ref 30.0–36.0)
MCV: 82.2 fL (ref 80.0–100.0)
Monocytes Absolute: 0.5 10*3/uL (ref 0.1–1.0)
Monocytes Relative: 8 %
Neutro Abs: 3.2 10*3/uL (ref 1.7–7.7)
Neutrophils Relative %: 57 %
Platelets: 218 10*3/uL (ref 150–400)
RBC: 5.01 MIL/uL (ref 4.22–5.81)
RDW: 14.5 % (ref 11.5–15.5)
WBC: 5.7 10*3/uL (ref 4.0–10.5)
nRBC: 0 % (ref 0.0–0.2)

## 2021-07-07 MED ORDER — HEPARIN SOD (PORK) LOCK FLUSH 100 UNIT/ML IV SOLN
INTRAVENOUS | Status: AC
  Administered 2021-07-07: 500 [IU]
  Filled 2021-07-07: qty 5

## 2021-07-07 MED ORDER — LIDOCAINE HCL 1 % IJ SOLN
INTRAMUSCULAR | Status: AC
  Administered 2021-07-07: 5 mL
  Filled 2021-07-07: qty 20

## 2021-07-07 MED ORDER — HEPARIN SOD (PORK) LOCK FLUSH 100 UNIT/ML IV SOLN
INTRAVENOUS | Status: AC | PRN
  Administered 2021-07-07: 500 [IU] via INTRAVENOUS

## 2021-07-07 MED ORDER — LIDOCAINE HCL 1 % IJ SOLN
INTRAMUSCULAR | Status: AC | PRN
  Administered 2021-07-07: 5 mL via INTRADERMAL

## 2021-07-07 NOTE — ED Triage Notes (Signed)
Pt has chronic UTI. Pt has been confused and has been receiving antibiotics for his UTI.

## 2021-07-07 NOTE — ED Provider Notes (Signed)
Emergency Medicine Provider Triage Evaluation Note  Mike Nixon , a 80 y.o. male  was evaluated in triage.  Pt complains of recurrent UTI. Advised to come here by urology.   Review of Systems  Positive: ams Negative: fever  Physical Exam  BP 129/83 (BP Location: Left Arm)   Pulse 72   Temp 98.2 F (36.8 C) (Oral)   Resp 16   Ht '5\' 7"'$  (1.702 m)   Wt 76.7 kg   SpO2 97%   BMI 26.48 kg/m  Gen:   Awake, no distress   Resp:  Normal effort  MSK:   Moves extremities without difficulty   Medical Decision Making  Medically screening exam initiated at 9:58 PM.  Appropriate orders placed.  Mike Nixon was informed that the remainder of the evaluation will be completed by another provider, this initial triage assessment does not replace that evaluation, and the importance of remaining in the ED until their evaluation is complete.    Bishop Dublin 07/07/21 2203    Dorie Rank, MD 07/10/21 1340

## 2021-07-07 NOTE — ED Notes (Signed)
Pt was assisted to the restroom. Pt required assistance with standing. Urine specimen was obtained.

## 2021-07-07 NOTE — Procedures (Signed)
Right SL brachial vein PICC placed today. Length 44 cm. Tip SVC/RA junction. No immediate complications. Medication used- 1% lidocaine to skin/SQ tissue. EBL< 5 cc.

## 2021-07-08 ENCOUNTER — Telehealth: Payer: Self-pay

## 2021-07-08 ENCOUNTER — Inpatient Hospital Stay (HOSPITAL_COMMUNITY): Payer: Medicare HMO

## 2021-07-08 DIAGNOSIS — G20 Parkinson's disease: Secondary | ICD-10-CM | POA: Diagnosis present

## 2021-07-08 DIAGNOSIS — Z1624 Resistance to multiple antibiotics: Secondary | ICD-10-CM | POA: Diagnosis present

## 2021-07-08 DIAGNOSIS — Z20822 Contact with and (suspected) exposure to covid-19: Secondary | ICD-10-CM | POA: Diagnosis present

## 2021-07-08 DIAGNOSIS — R140 Abdominal distension (gaseous): Secondary | ICD-10-CM | POA: Diagnosis not present

## 2021-07-08 DIAGNOSIS — R079 Chest pain, unspecified: Secondary | ICD-10-CM | POA: Diagnosis not present

## 2021-07-08 DIAGNOSIS — Z87440 Personal history of urinary (tract) infections: Secondary | ICD-10-CM | POA: Diagnosis not present

## 2021-07-08 DIAGNOSIS — I69351 Hemiplegia and hemiparesis following cerebral infarction affecting right dominant side: Secondary | ICD-10-CM | POA: Diagnosis not present

## 2021-07-08 DIAGNOSIS — Z7902 Long term (current) use of antithrombotics/antiplatelets: Secondary | ICD-10-CM | POA: Diagnosis not present

## 2021-07-08 DIAGNOSIS — H409 Unspecified glaucoma: Secondary | ICD-10-CM | POA: Diagnosis present

## 2021-07-08 DIAGNOSIS — N400 Enlarged prostate without lower urinary tract symptoms: Secondary | ICD-10-CM | POA: Diagnosis present

## 2021-07-08 DIAGNOSIS — R0789 Other chest pain: Secondary | ICD-10-CM | POA: Diagnosis present

## 2021-07-08 DIAGNOSIS — Z888 Allergy status to other drugs, medicaments and biological substances status: Secondary | ICD-10-CM | POA: Diagnosis not present

## 2021-07-08 DIAGNOSIS — Z881 Allergy status to other antibiotic agents status: Secondary | ICD-10-CM | POA: Diagnosis not present

## 2021-07-08 DIAGNOSIS — B9620 Unspecified Escherichia coli [E. coli] as the cause of diseases classified elsewhere: Secondary | ICD-10-CM | POA: Diagnosis present

## 2021-07-08 DIAGNOSIS — E785 Hyperlipidemia, unspecified: Secondary | ICD-10-CM | POA: Diagnosis present

## 2021-07-08 DIAGNOSIS — Z886 Allergy status to analgesic agent status: Secondary | ICD-10-CM | POA: Diagnosis not present

## 2021-07-08 DIAGNOSIS — Z823 Family history of stroke: Secondary | ICD-10-CM | POA: Diagnosis not present

## 2021-07-08 DIAGNOSIS — E109 Type 1 diabetes mellitus without complications: Secondary | ICD-10-CM | POA: Diagnosis present

## 2021-07-08 DIAGNOSIS — I10 Essential (primary) hypertension: Secondary | ICD-10-CM | POA: Diagnosis present

## 2021-07-08 DIAGNOSIS — F0390 Unspecified dementia without behavioral disturbance: Secondary | ICD-10-CM | POA: Diagnosis not present

## 2021-07-08 DIAGNOSIS — Z882 Allergy status to sulfonamides status: Secondary | ICD-10-CM | POA: Diagnosis not present

## 2021-07-08 DIAGNOSIS — K5900 Constipation, unspecified: Secondary | ICD-10-CM | POA: Diagnosis present

## 2021-07-08 DIAGNOSIS — Z87820 Personal history of traumatic brain injury: Secondary | ICD-10-CM | POA: Diagnosis not present

## 2021-07-08 DIAGNOSIS — Z79899 Other long term (current) drug therapy: Secondary | ICD-10-CM | POA: Diagnosis not present

## 2021-07-08 DIAGNOSIS — N390 Urinary tract infection, site not specified: Secondary | ICD-10-CM | POA: Diagnosis present

## 2021-07-08 DIAGNOSIS — Z7401 Bed confinement status: Secondary | ICD-10-CM | POA: Diagnosis not present

## 2021-07-08 LAB — LIPID PANEL
Cholesterol: 133 mg/dL (ref 0–200)
HDL: 47 mg/dL (ref >40)
LDL Cholesterol: 75 mg/dL (ref 0–99)
Total CHOL/HDL Ratio: 2.8 RATIO
Triglycerides: 56 mg/dL (ref <150)
VLDL: 11 mg/dL (ref 0–40)

## 2021-07-08 LAB — GLUCOSE, CAPILLARY
Glucose-Capillary: 100 mg/dL — ABNORMAL HIGH (ref 70–99)
Glucose-Capillary: 143 mg/dL — ABNORMAL HIGH (ref 70–99)
Glucose-Capillary: 111 mg/dL — ABNORMAL HIGH (ref 70–99)
Glucose-Capillary: 104 mg/dL — ABNORMAL HIGH (ref 70–99)

## 2021-07-08 LAB — CBC
HCT: 38.6 % — ABNORMAL LOW (ref 39.0–52.0)
Hemoglobin: 12 g/dL — ABNORMAL LOW (ref 13.0–17.0)
MCH: 25.5 pg — ABNORMAL LOW (ref 26.0–34.0)
MCHC: 31.1 g/dL (ref 30.0–36.0)
MCV: 82 fL (ref 80.0–100.0)
Platelets: 208 10*3/uL (ref 150–400)
RBC: 4.71 MIL/uL (ref 4.22–5.81)
RDW: 14.2 % (ref 11.5–15.5)
WBC: 5.3 10*3/uL (ref 4.0–10.5)
nRBC: 0 % (ref 0.0–0.2)

## 2021-07-08 LAB — SARS CORONAVIRUS 2 (TAT 6-24 HRS): SARS Coronavirus 2: NEGATIVE

## 2021-07-08 LAB — TROPONIN I (HIGH SENSITIVITY)
Troponin I (High Sensitivity): 6 ng/L (ref <18)
Troponin I (High Sensitivity): 6 ng/L (ref <18)

## 2021-07-08 LAB — PROCALCITONIN: Procalcitonin: <0.1 ng/mL

## 2021-07-08 LAB — D-DIMER, QUANTITATIVE: D-Dimer, Quant: 0.67 ug/mL-FEU — ABNORMAL HIGH (ref 0.00–0.50)

## 2021-07-08 LAB — CREATININE, SERUM
Creatinine, Ser: 1.01 mg/dL (ref 0.61–1.24)
GFR, Estimated: >60 mL/min (ref >60)

## 2021-07-08 LAB — HEMOGLOBIN A1C
Hgb A1c MFr Bld: 6.8 % — ABNORMAL HIGH (ref 4.8–5.6)
Mean Plasma Glucose: 148.46 mg/dL

## 2021-07-08 LAB — C-REACTIVE PROTEIN: CRP: <0.5 mg/dL (ref <1.0)

## 2021-07-08 MED ORDER — ACETAMINOPHEN 500 MG PO TABS
1000.0000 mg | ORAL_TABLET | Freq: Four times a day (QID) | ORAL | Status: DC | PRN
  Administered 2021-07-08 – 2021-07-11 (×6): 1000 mg via ORAL
  Filled 2021-07-08 (×6): qty 2

## 2021-07-08 MED ORDER — ATORVASTATIN CALCIUM 40 MG PO TABS
40.0000 mg | ORAL_TABLET | Freq: Every evening | ORAL | Status: DC
  Administered 2021-07-08 – 2021-07-12 (×5): 40 mg via ORAL
  Filled 2021-07-08 (×5): qty 1

## 2021-07-08 MED ORDER — SODIUM CHLORIDE 0.9% FLUSH: 10.0000 mL | INTRAVENOUS | Status: DC | PRN

## 2021-07-08 MED ORDER — ALBUTEROL SULFATE (2.5 MG/3ML) 0.083% IN NEBU: 2.5000 mg | INHALATION_SOLUTION | Freq: Four times a day (QID) | RESPIRATORY_TRACT | Status: DC | PRN

## 2021-07-08 MED ORDER — CARBIDOPA-LEVODOPA ER 36.25-145 MG PO CPCR
3.0000 | ORAL_CAPSULE | Freq: Three times a day (TID) | ORAL | Status: DC
  Administered 2021-07-09 – 2021-07-12 (×13): 3 via ORAL

## 2021-07-08 MED ORDER — ENOXAPARIN SODIUM 40 MG/0.4ML IJ SOSY
40.0000 mg | PREFILLED_SYRINGE | Freq: Every day | INTRAMUSCULAR | Status: DC
  Administered 2021-07-08 – 2021-07-12 (×5): 40 mg via SUBCUTANEOUS
  Filled 2021-07-08 (×5): qty 0.4

## 2021-07-08 MED ORDER — ISTRADEFYLLINE 40 MG PO TABS: 40.0000 mg | ORAL_TABLET | Freq: Every day | ORAL | Status: DC

## 2021-07-08 MED ORDER — ONDANSETRON HCL 4 MG PO TABS
4.0000 mg | ORAL_TABLET | Freq: Four times a day (QID) | ORAL | Status: DC | PRN
  Administered 2021-07-09 – 2021-07-10 (×3): 4 mg via ORAL
  Filled 2021-07-08 (×3): qty 1

## 2021-07-08 MED ORDER — ONDANSETRON HCL 4 MG/2ML IJ SOLN: 4.0000 mg | Freq: Four times a day (QID) | INTRAMUSCULAR | Status: DC | PRN

## 2021-07-08 MED ORDER — MORPHINE SULFATE (PF) 2 MG/ML IV SOLN: 1.0000 mg | Freq: Four times a day (QID) | INTRAVENOUS | Status: DC | PRN

## 2021-07-08 MED ORDER — ALUM & MAG HYDROXIDE-SIMETH 200-200-20 MG/5ML PO SUSP
30.0000 mL | Freq: Once | ORAL | Status: AC
  Administered 2021-07-08: 30 mL via ORAL
  Filled 2021-07-08: qty 30

## 2021-07-08 MED ORDER — CHLORHEXIDINE GLUCONATE CLOTH 2 % EX PADS
6.0000 | MEDICATED_PAD | Freq: Every day | CUTANEOUS | Status: DC
  Administered 2021-07-08 – 2021-07-12 (×5): 6 via TOPICAL

## 2021-07-08 MED ORDER — METFORMIN HCL ER 500 MG PO TB24
500.0000 mg | ORAL_TABLET | Freq: Every day | ORAL | Status: DC
  Administered 2021-07-09: 500 mg via ORAL
  Filled 2021-07-08: qty 1

## 2021-07-08 MED ORDER — HYDROCHLOROTHIAZIDE 25 MG PO TABS
25.0000 mg | ORAL_TABLET | Freq: Every day | ORAL | Status: DC
  Administered 2021-07-08 – 2021-07-09 (×2): 25 mg via ORAL
  Filled 2021-07-08 (×2): qty 1

## 2021-07-08 MED ORDER — CLOPIDOGREL BISULFATE 75 MG PO TABS
75.0000 mg | ORAL_TABLET | Freq: Every day | ORAL | Status: DC
  Administered 2021-07-08 – 2021-07-12 (×5): 75 mg via ORAL
  Filled 2021-07-08 (×5): qty 1

## 2021-07-08 MED ORDER — RIVASTIGMINE 4.6 MG/24HR TD PT24
4.6000 mg | MEDICATED_PATCH | Freq: Every day | TRANSDERMAL | Status: DC
  Administered 2021-07-08 – 2021-07-12 (×5): 4.6 mg via TRANSDERMAL
  Filled 2021-07-08 (×5): qty 1

## 2021-07-08 MED ORDER — SODIUM CHLORIDE 0.9 % IV SOLN
1.0000 g | Freq: Two times a day (BID) | INTRAVENOUS | Status: DC
  Administered 2021-07-08 – 2021-07-12 (×9): 1 g via INTRAVENOUS
  Filled 2021-07-08 (×10): qty 1

## 2021-07-08 MED ORDER — FUROSEMIDE 20 MG PO TABS: 20.0000 mg | ORAL_TABLET | Freq: Every day | ORAL | Status: DC | PRN

## 2021-07-08 MED ORDER — PANTOPRAZOLE SODIUM 40 MG PO TBEC
40.0000 mg | DELAYED_RELEASE_TABLET | Freq: Every day | ORAL | Status: DC
  Administered 2021-07-08 – 2021-07-12 (×5): 40 mg via ORAL
  Filled 2021-07-08 (×5): qty 1

## 2021-07-08 NOTE — H&P (Signed)
History and Physical    Mike Nixon PIR:[id number] DOB: Nov 02, 1941 DOA: 07/07/2021  PCP: Mike Hilding, MD  Patient coming from: home  I have personally briefly reviewed patient's old medical records in [place]  Chief Complaint: UTI needing iv antibiotics   HPI: Mike Nixon is a 80 y.o. 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  Of note patient has picc line placed  8/11 for planned home infusion. Per family patient is more confused of late, but patient has had no complaints of fever, chills, n/v/d has + dysuria which is a chronic issues. Patient on evaluation states he has sharp  chest discomfort central chest not associated with radiation note made worse with movement or deep breathing, note it started 30 minutes ago after eating and initially started in his stomach sharp and crampy and then travelled up to his chest. He states it is a 8/10 at this time. He note no sob, no palpitations or nausea associated with this.  ED Course:  Labs: NA 140, k 3.6, glu 148,cr1.15 at baseline Wbc:5.7, hgb12.9 (close to baseline , prior was 14 but this is outlier) Tx merrem Urine culture 8/8 ecoli MDRO( sensitive to erta/gent/merr/zosyn) COVID pending Review of Systems: As per HPI otherwise 10 point review of systems negative.   Past Medical History:  Diagnosis Date   Anticoagulant long-term use    PLAVIX   Benign localized prostatic hyperplasia with lower urinary tract symptoms (LUTS)    Bladder tumor    Borderline diabetes    watches diet   ED (erectile dysfunction) of organic origin    Gait disturbance, post-stroke    uses wheelchair long distance   Glaucoma, right eye    History of bleeding peptic ulcer    03/  1979  s/p  surgery   History of concussion    1987 no loc-- no residual   History of CVA with residual deficit    05/ 2016 CYPTOGENIC CVA --- RESIDUAL GAIT DISTURBANCE AND SIALORRHEA ACCESS (DROOLING)   Hyperlipidemia    Hypertension    Mild atherosclerosis of both carotid arteries    per duplex from outside facitlity 04-08-2015 less then 50% stenosis bilateral ICA   Parkinson's disease [facility]) neurologist -- dr Alfonso Patten. andrew falconer ([facility], [facility])   dx 2015   PBA (pseudobulbar affect)    laughter-- residual from cva 05/ 2016   PFO (patent foramen ovale)    per TEE 08-18-2016   Speech impairment    post-stroke   Status post placement of implantable loop recorder 08/18/2016   placed by dr allred   Wears glasses     Past Surgical History:  Procedure Laterality Date   APPENDECTOMY  1978   CATARACT EXTRACTION W/ INTRAOCULAR LENS IMPLANT Right 2014 approx.   CYSTOSCOPY W/ URETERAL STENT PLACEMENT Bilateral 05/07/2017   Procedure: CYSTOSCOPY WITH RETROGRADE PYELOGRAM/URETERAL;  Surgeon: Cleon Gustin, MD;  Location: [facility];  Service: Urology;  Laterality: Bilateral;   EP IMPLANTABLE DEVICE N/A 08/18/2016   Procedure: Loop Recorder Insertion;  Surgeon: Thompson Grayer, MD;  Location: [facility];  Service: Cardiovascular;  Laterality: N/A;   implantable loop recorder removal  12/27/2020   LINQ removed for EOS   REMOVAL NODULES  BILATERAL FLANK  2008 approx.   per pt benign fatty tissue   REPAIR OF PERFORATED ULCER  01/1978   bleeding peptic ulcer   TEE WITHOUT CARDIOVERSION N/A 08/18/2016   Procedure: TRANSESOPHAGEAL ECHOCARDIOGRAM (TEE);  Surgeon: Sueanne Margarita, MD;  Location: [facility];  Service: Cardiovascular;  Laterality: N/A; no evidence thrombus;  mobile atrial septum w/ positive bubble study consistent with PFO/ trivial AR, mild TR   TRANSTHORACIC ECHOCARDIOGRAM  04/09/2015   ef 49-44%, grade 1 diastolic dysfunction/   mild AV sclerosis without stenosis/  mild TR,  trivial MR and PR   TRANSURETHRAL RESECTION OF BLADDER TUMOR N/A 05/07/2017   Procedure: TRANSURETHRAL RESECTION OF BLADDER TUMOR (TURBT);  Surgeon: Cleon Gustin, MD;  Location: [facility];  Service: Urology;  Laterality: N/A;   TRANSURETHRAL RESECTION OF BLADDER TUMOR N/A 06/18/2017   Procedure: TRANSURETHRAL RESECTION OF BLADDER TUMOR (TURBT);  Surgeon: Cleon Gustin, MD;  Location: [facility];  Service: Urology;  Laterality: N/A;   TRANSURETHRAL RESECTION OF BLADDER TUMOR N/A 09/11/2018   Procedure: TRANSURETHRAL RESECTION OF BLADDER TUMOR (TURBT);  Surgeon: Cleon Gustin, MD;  Location: [facility];  Service: Urology;  Laterality: N/A;   TRANSURETHRAL RESECTION OF PROSTATE  08-20-2013   at [place]     reports that he has never smoked. He has never used smokeless tobacco. He reports that he does not drink alcohol and does not use drugs.  Allergies  Allergen Reactions   Benazepril Cough   Doxycycline Other (See Comments)    Unknown   Levofloxacin Hives and Swelling   Simvastatin Other (See Comments)    Pt does not remember what the reaction was. ?muscle aches   Tamsulosin Hives and Swelling   Tramadol Hcl Hives and Swelling   Sulfa Antibiotics Hives, Swelling, Rash and Other (See Comments)    Severe rash    Family History  Problem Relation Age of Onset   Stroke Sister     Prior to Admission medications   Medication Sig Start Date End Date Taking? Authorizing Provider  acetaminophen (TYLENOL) 500 MG tablet Take 1,000 mg by mouth every 6 (six) hours as needed for mild pain or headache.   Yes [provider]  atorvastatin (LIPITOR) 40 MG tablet Take 40 mg by mouth every evening.   Yes [provider]  Carbidopa-Levodopa ER 36.25-145 MG CPCR Take 3 capsules by mouth 4 (four) times daily.    Yes [provider]  clopidogrel (PLAVIX) 75 MG tablet Take 1 tablet (75 mg total) by mouth daily.  01/16/19  Yes Johnson, Clanford L, MD  hydrochlorothiazide (HYDRODIURIL) 25 MG tablet Take 25 mg by mouth daily. 01/12/20  Yes [provider]  meclizine (ANTIVERT) 25 MG tablet Take 25 mg by mouth 3 (three) times daily as needed for dizziness.    Yes [provider]  metFORMIN (GLUCOPHAGE-XR) 500 MG 24 hr tablet Take 500 mg by mouth daily. 04/27/21  Yes [provider]  NOURIANZ 40 MG TABS Take 1 tablet by mouth daily.  12/27/19  Yes [provider]  NUEDEXTA 20-10 MG CAPS Take 1 capsule by mouth 2 (two) times daily. 06/02/18  Yes [provider]  pantoprazole (PROTONIX) 40 MG tablet Take 1 tablet by mouth daily. 08/12/20  Yes [provider]  rivastigmine (EXELON) 4.6 mg/24hr Place 4.6 mg onto the skin daily. 07/06/21  Yes [provider]  Travoprost, BAK Free, (TRAVATAN) 0.004 % SOLN ophthalmic solution Place 1 drop into both eyes at  bedtime. 04/15/20  Yes [provider]  chlorhexidine (PERIDEX) 0.12 % solution 15 mLs by Mouth Rinse route 2 (two) times daily. Patient not taking: No sig reported 04/19/20   [provider]  ertapenem 1,000 mg in sodium chloride 0.9 % 100 mL Inject 1,000 mg into the vein daily. To be given daily via picc line by home health nurse for total of 14 doses. Patient not taking: Reported on 07/08/2021 07/06/21   Cleon Gustin, MD  fesoterodine (TOVIAZ) 8 MG TB24 tablet Take 1 tablet (8 mg total) by mouth daily. Patient not taking: No sig reported 05/17/20   Cleon Gustin, MD  furosemide (LASIX) 20 MG tablet Take 1 tablet (20 mg total) by mouth as needed. Patient taking differently: Take 20 mg by mouth daily as needed for fluid.  08/20/18 05/14/20  Arnoldo Lenis, MD  HYDROcodone-acetaminophen (NORCO/VICODIN) 5-325 MG tablet Take by mouth. Patient not taking: Reported on 07/08/2021 10/06/20   [provider]  Istradefylline 20 MG TABS Take by mouth. Patient not taking: No sig  reported 06/10/20   [provider]  nitrofurantoin (MACRODANTIN) 50 MG capsule Take 1 capsule (50 mg total) by mouth at bedtime. Patient not taking: Reported on 07/08/2021 01/24/21   Cleon Gustin, MD  polyethylene glycol powder (GLYCOLAX/MIRALAX) 17 GM/SCOOP powder Take 17 g by mouth daily. Patient not taking: Reported on 07/08/2021 05/14/20   Domenic Moras, PA-C  RYTARY [phone] MG CPCR Take by mouth. Patient not taking: Reported on 07/08/2021 09/02/20   [provider]  sodium phosphate (FLEET) 7-19 GM/118ML ENEM Place 133 mLs (1 enema total) rectally daily as needed for severe constipation. Patient not taking: Reported on 07/08/2021 05/14/20   Domenic Moras, PA-C    Physical Exam: Vitals:   07/08/21 0300 07/08/21 0320 07/08/21 0330 07/08/21 0727  BP: 129/80 137/84  129/77  Pulse: (!) 59 (!) 57  100  Resp:  18  16  Temp:  97.7 F (36.5 C)  97.6 F (36.4 C)  TempSrc:    Oral  SpO2: 100% 99%  100%  Weight:   66.3 kg   Height:   5' 7"  (1.702 m)      Vitals:   07/08/21 0300 07/08/21 0320 07/08/21 0330 07/08/21 0727  BP: 129/80 137/84  129/77  Pulse: (!) 59 (!) 57  100  Resp:  18  16  Temp:  97.7 F (36.5 C)  97.6 F (36.4 C)  TempSrc:    Oral  SpO2: 100% 99%  100%  Weight:   66.3 kg   Height:   5' 7"  (1.702 m)   Constitutional: NAD, calm, comfortable Eyes: PERRL, lids and conjunctivae normal ENMT: Mucous membranes are moist. Posterior pharynx clear of any exudate or lesions.Normal dentition.  Neck: normal, supple, no masses, no thyromegaly Respiratory: clear to auscultation bilaterally, no wheezing, no crackles. Normal respiratory effort. No accessory muscle use.  Cardiovascular: Regular rate and rhythm, no murmurs / rubs / gallops. No extremity edema. 2+ pedal pulses. No carotid bruits.  Abdomen:+ tenderness epigastrium and left lower quad, no masses palpated. No hepatosplenomegaly. Bowel sounds positive. Soft , not distendered Musculoskeletal: no clubbing /  cyanosis. No joint deformity upper and lower extremities. Good ROM, no contractures. Normal muscle tone.  Skin: no rashes, lesions, ulcers. No induration Neurologic: CN 2-12 grossly intact. Sensation intact, DTR normal. Strength 5/5 in all 4.  Psychiatric: Normal judgment and insight. Alert and oriented x 3. Normal mood.    Labs on Admission: I  have personally reviewed following labs and imaging studies  CBC: Recent Labs  Lab 07/07/21 2237  WBC 5.7  NEUTROABS 3.2  HGB 12.9*  HCT 41.2  MCV 82.2  PLT 517   Basic Metabolic Panel: Recent Labs  Lab 07/07/21 2237  NA 140  K 3.6  CL 104  CO2 29  GLUCOSE 148*  BUN 16  CREATININE 1.15  CALCIUM 9.6   GFR: Estimated Creatinine Clearance: 48.7 mL/min (by C-G formula based on SCr of 1.15 mg/dL). Liver Function Tests: Recent Labs  Lab 07/07/21 2237  AST 17  ALT 7  ALKPHOS 67  BILITOT 0.9  PROT 6.9  ALBUMIN 4.1   No results for input(s): LIPASE, AMYLASE in the last 168 hours. No results for input(s): AMMONIA in the last 168 hours. Coagulation Profile: No results for input(s): INR, PROTIME in the last 168 hours. Cardiac Enzymes: No results for input(s): CKTOTAL, CKMB, CKMBINDEX, TROPONINI in the last 168 hours. BNP (last 3 results) No results for input(s): PROBNP in the last 8760 hours. HbA1C: No results for input(s): HGBA1C in the last 72 hours. CBG: No results for input(s): GLUCAP in the last 168 hours. Lipid Profile: No results for input(s): CHOL, HDL, LDLCALC, TRIG, CHOLHDL, LDLDIRECT in the last 72 hours. Thyroid Function Tests: No results for input(s): TSH, T4TOTAL, FREET4, T3FREE, THYROIDAB in the last 72 hours. Anemia Panel: No results for input(s): VITAMINB12, FOLATE, FERRITIN, TIBC, IRON, RETICCTPCT in the last 72 hours. Urine analysis:    Component Value Date/Time   COLORURINE YELLOW 07/07/2021 2204   APPEARANCEUR CLOUDY (A) 07/07/2021 2204   APPEARANCEUR Cloudy (A) 07/04/2021 1127   LABSPEC 1.019  07/07/2021 2204   PHURINE 5.0 07/07/2021 [place] 07/07/2021 2204   HGBUR NEGATIVE 07/07/2021 2204   BILIRUBINUR NEGATIVE 07/07/2021 2204   BILIRUBINUR Negative 07/04/2021 1127   KETONESUR 5 (A) 07/07/2021 2204   PROTEINUR NEGATIVE 07/07/2021 2204   UROBILINOGEN negative (A) 03/24/2020 1519   NITRITE NEGATIVE 07/07/2021 2204   LEUKOCYTESUR LARGE (A) 07/07/2021 2204    Radiological Exams on Admission: IR PICC PLACEMENT RIGHT >5 YRS INC IMG GUIDE  Result Date: 07/07/2021 INDICATION: Patient with history of chronic cystitis with hematuria; central venous access requested for antibiotic therapy EXAM: RIGHT UPPER EXTREMITY ULTRASOUND AND FLUOROSCOPIC GUIDED PICC LINE INSERTION MEDICATIONS: 1% lidocaine to skin and subcutaneous tissue ANESTHESIA/SEDATION: None FLUOROSCOPY TIME:  Fluoroscopy Time:  36 seconds (4 mGy). COMPLICATIONS: None immediate. TECHNIQUE: The procedure, risks, benefits, and alternatives were explained to the patient and informed written consent was obtained. A timeout was performed prior to the initiation of the procedure. The right upper extremity was prepped with chlorhexidine in a sterile fashion, and a sterile drape was applied covering the operative field. Maximum barrier sterile technique with sterile gowns and gloves were used for the procedure. A timeout was performed prior to the initiation of the procedure. Local anesthesia was provided with 1% lidocaine. Under direct ultrasound guidance, the right brachial vein was accessed with a micropuncture kit after the overlying soft tissues were anesthetized with 1% lidocaine. An ultrasound image was saved for documentation purposes. A guidewire was advanced to the level of the superior caval-atrial junction for measurement purposes and the PICC line was cut to length. A peel-away sheath was placed and a 44 cm, 5 [place], single lumen was inserted to level of the superior caval-atrial junction. A post procedure spot  fluoroscopic was obtained. The catheter easily aspirated and flushed and was sutured in place. A dressing  was placed. The patient tolerated the procedure well without immediate post procedural complication. FINDINGS: After catheter placement, the tip lies within the superior cavoatrial junction the catheter aspirates and flushes normally and is ready for immediate use. IMPRESSION: Successful ultrasound and fluoroscopic guided placement of a right brachial vein approach, 44 cm, 5 [place], single lumen PICC with tip at the superior caval-atrial junction. The PICC line is ready for immediate use. Read by: Rowe Robert, PA-C Electronically Signed   By: Albin Felling MD   On: 07/07/2021 09:42    EKG: Independently reviewed.  Assessment/Plan Complicated MDRO Ecoli UTI  - continue on etrapenem -supportive care for symptoms as needed -patient currently stable, did  have low temp 97.2 on admission and family notes he is more confused from baseline  -will check blood cultures, inflammatory markers to be complete   Atypical Chest pain  -presumed gi in origin, pain with eating improved with drinking,improved slightly with laying flat  -will tx with gi cocktail -will need to r/o cardiac cause as well Ekg: noted no acute findings:  NSR nonspecific t wave flattening -will  check ce,  patient took am meds that includes plavix.  -will  also place on tele for now.  -will follow patient progress  -of note vitals stable    CVA 2016  -hx of mild residual right-sided lower ext weakness -continue  on Plavix  Parkinson's disease  -dx 2015 -continue on home regimen -followed by neurology    Hypertension -currently stable  -resume home regimen   Hyperlipidemia -continue statin   borderline DMII  -check a1c  6.4  2018 - place on finger sticks   Glaucoma -no active issues     DVT prophylaxis: lwmh Code Status: FULL Family Communication:  none at beside  Disposition Plan:patient  expected to  be admitted greater than 2 midnights  Consults called: n/a Admission status: inpatient    Clance Boll MD [facility]   If 7PM-7AM, please contact night-coverage [email or web address] Password [facility]  07/08/2021, 8:55 AM

## 2021-07-08 NOTE — Telephone Encounter (Signed)
Daughter, Brayton Layman, called this am. Patient has been admitted to [facility] hospital for IV infusions.    Called [facility]  in [place], [place] order for outpatient to discontinue.

## 2021-07-08 NOTE — ED Provider Notes (Signed)
Mike Nixon   CSN: [id number] Arrival date & time: 07/07/21  2145     History Chief Complaint  Patient presents with   Urinary Tract Infection    Mike Nixon is a 80 y.o. male.  The history is provided by the patient and a relative.  Urinary Tract Infection Mike Nixon is a 80 y.o. male who presents to the Emergency Department complaining of UTI.  He presents to the ED accompanied by his son for evaluation of UTI.  Had a PICC line placed today for UTI.   Dr. Ileene Patrick called because home health nursing could not be arranged to give meds at home.  He has dysuria for the last year.  No fever, nausea, vomiting.  Has confusion compared to baseline.  Lives with wife, who is currently dealing with medical issues herself.  Just completed a course of antibiotics a few days ago.      Past Medical History:  Diagnosis Date   Anticoagulant long-term use    PLAVIX   Benign localized prostatic hyperplasia with lower urinary tract symptoms (LUTS)    Bladder tumor    Borderline diabetes    watches diet   ED (erectile dysfunction) of organic origin    Gait disturbance, post-stroke    uses wheelchair long distance   Glaucoma, right eye    History of bleeding peptic ulcer    03/ 1979  s/p  surgery   History of concussion    1987 no loc-- no residual   History of CVA with residual deficit    05/ 2016 CYPTOGENIC CVA --- RESIDUAL GAIT DISTURBANCE AND SIALORRHEA ACCESS (DROOLING)   Hyperlipidemia    Hypertension    Mild atherosclerosis of both carotid arteries    per duplex from outside facitlity 04-08-2015 less then 50% stenosis bilateral ICA   Parkinson's disease [facility]) neurologist -- dr Alfonso Patten. andrew falconer ([facility], [facility])   dx 2015   PBA (pseudobulbar affect)    laughter-- residual from cva 05/ 2016   PFO (patent foramen ovale)    per TEE 08-18-2016   Speech impairment    post-stroke   Status post  placement of implantable loop recorder 08/18/2016   placed by dr Rayann Heman   Wears glasses     Patient Active Problem List   Diagnosis Date Noted   Incomplete emptying of bladder 01/05/2021   Acute cystitis with hematuria 11/08/2020   OAB (overactive bladder) 03/24/2020   Malignant neoplasm of overlapping sites of bladder ([place]) 01/14/2020   Benign prostatic hyperplasia with urinary obstruction 01/13/2019   Urinary tract infection without hematuria 01/13/2019   Sepsis ([place]) 01/12/2019   Chest pain in adult 10/11/2018   Hematuria 10/11/2018   Community acquired pneumonia 10/11/2018   CVA (cerebral vascular accident) ([place]) 10/11/2018   Parkinson's disease ([place]) 10/11/2018   Benign essential HTN 10/11/2018   HLD (hyperlipidemia) 10/11/2018   Bladder tumor 05/07/2017    Past Surgical History:  Procedure Laterality Date   APPENDECTOMY  1978   CATARACT EXTRACTION W/ INTRAOCULAR LENS IMPLANT Right 2014 approx.   CYSTOSCOPY W/ URETERAL STENT PLACEMENT Bilateral 05/07/2017   Procedure: CYSTOSCOPY WITH RETROGRADE PYELOGRAM/URETERAL;  Surgeon: Cleon Gustin, MD;  Location: [facility];  Service: Urology;  Laterality: Bilateral;   EP IMPLANTABLE DEVICE N/A 08/18/2016   Procedure: Loop Recorder Insertion;  Surgeon: Thompson Grayer, MD;  Location: [facility];  Service: Cardiovascular;  Laterality: N/A;   implantable loop recorder removal  12/27/2020   LINQ removed for EOS   REMOVAL NODULES BILATERAL FLANK  2008 approx.   per pt benign fatty tissue   REPAIR OF PERFORATED ULCER  01/1978   bleeding peptic ulcer   TEE WITHOUT CARDIOVERSION N/A 08/18/2016   Procedure: TRANSESOPHAGEAL ECHOCARDIOGRAM (TEE);  Surgeon: Sueanne Margarita, MD;  Location: [facility] END[facility];  Service: Cardiovascular;  Laterality: N/A; no evidence thrombus;  mobile atrial septum w/ positive bubble study consistent with PFO/ trivial AR, mild TR   TRANSTHORACIC ECHOCARDIOGRAM  04/09/2015   ef 60-65%, grade 1  diastolic dysfunction/  mild AV sclerosis without stenosis/  mild TR,  trivial MR and PR   TRANSURETHRAL RESECTION OF BLADDER TUMOR N/A 05/07/2017   Procedure: TRANSURETHRAL RESECTION OF BLADDER TUMOR (TURBT);  Surgeon: Cleon Gustin, MD;  Location: [facility];  Service: Urology;  Laterality: N/A;   TRANSURETHRAL RESECTION OF BLADDER TUMOR N/A 06/18/2017   Procedure: TRANSURETHRAL RESECTION OF BLADDER TUMOR (TURBT);  Surgeon: Cleon Gustin, MD;  Location: [facility];  Service: Urology;  Laterality: N/A;   TRANSURETHRAL RESECTION OF BLADDER TUMOR N/A 09/11/2018   Procedure: TRANSURETHRAL RESECTION OF BLADDER TUMOR (TURBT);  Surgeon: Cleon Gustin, MD;  Location: [facility];  Service: Urology;  Laterality: N/A;   TRANSURETHRAL RESECTION OF PROSTATE  08-20-2013   at [facility] History  Problem Relation Age of Onset   Stroke Sister     Social History   Tobacco Use   Smoking status: Never   Smokeless tobacco: Never  Vaping Use   Vaping Use: Never used  Substance Use Topics   Alcohol use: No   Drug use: No    Home Medications Prior to Admission medications   Medication Sig Start Date End Date Taking? Authorizing Provider  acetaminophen (TYLENOL) 500 MG tablet Take 1,000 mg by mouth every 6 (six) hours as needed for mild pain or headache.    [provider]  atorvastatin (LIPITOR) 40 MG tablet Take 40 mg by mouth every evening.    [provider]  Carbidopa-Levodopa ER 36.25-145 MG CPCR Take 3 capsules by mouth 4 (four) times daily.     [provider]  chlorhexidine (PERIDEX) 0.12 % solution 15 mLs by Mouth Rinse route 2 (two) times daily. 04/19/20   [provider]  clopidogrel (PLAVIX) 75 MG tablet Take 1 tablet (75 mg total) by mouth daily. 01/16/19   Johnson, Clanford L, MD  ertapenem 1,000 mg in sodium chloride 0.9 % 100 mL Inject 1,000 mg into the vein daily. To be given daily via picc line by home health nurse for  total of 14 doses. 07/06/21   McKenzie, Candee Furbish, MD  fesoterodine (TOVIAZ) 8 MG TB24 tablet Take 1 tablet (8 mg total) by mouth daily. 05/17/20   McKenzie, Candee Furbish, MD  furosemide (LASIX) 20 MG tablet Take 1 tablet (20 mg total) by mouth as needed. Patient taking differently: Take 20 mg by mouth daily as needed for fluid.  08/20/18 05/14/20  Arnoldo Lenis, MD  hydrochlorothiazide (HYDRODIURIL) 25 MG tablet Take 25 mg by mouth daily. 01/12/20   [provider]  HYDROcodone-acetaminophen (NORCO/VICODIN) 5-325 MG tablet Take by mouth. 10/06/20   [provider]  Istradefylline 20 MG TABS Take by mouth. 06/10/20   [provider]  meclizine (ANTIVERT) 25 MG tablet Take 25 mg by mouth 3 (three) times daily as needed for dizziness.     [provider]  nitrofurantoin Clancy Gourd)  50 MG capsule Take 1 capsule (50 mg total) by mouth at bedtime. 01/24/21   McKenzie, Candee Furbish, MD  NOURIANZ 40 MG TABS Take 1 tablet by mouth daily.  12/27/19   [provider]  NUEDEXTA 20-10 MG CAPS Take 1 capsule by mouth 2 (two) times daily. 06/02/18   [provider]  pantoprazole (PROTONIX) 40 MG tablet Take 1 tablet by mouth daily. 08/12/20   [provider]  polyethylene glycol powder (GLYCOLAX/MIRALAX) 17 GM/SCOOP powder Take 17 g by mouth daily. 05/14/20   Domenic Moras, PA-C  RYTARY [phone] MG CPCR Take by mouth. 09/02/20   [provider]  sodium phosphate (FLEET) 7-19 GM/118ML ENEM Place 133 mLs (1 enema total) rectally daily as needed for severe constipation. 05/14/20   Domenic Moras, PA-C  Travoprost, BAK Free, (TRAVATAN) 0.004 % SOLN ophthalmic solution Place 1 drop into both eyes at bedtime. 04/15/20   [provider]    Allergies    Benazepril, Doxycycline, Levofloxacin, Simvastatin, Tamsulosin, Tramadol hcl, and Sulfa antibiotics  Review of Systems   Review of Systems  All other systems reviewed and are negative.  Physical  Exam Updated Vital Signs BP 128/80 (BP Location: Left Arm)   Pulse 63   Temp 98.2 F (36.8 C) (Oral)   Resp 18   Ht _0  (1.702 m)   Wt 76.7 kg   SpO2 100%   BMI 26.48 kg/m   Physical Exam Vitals and nursing Nixon reviewed.  Constitutional:      Appearance: He is well-developed.  HENT:     Head: Normocephalic and atraumatic.  Cardiovascular:     Rate and Rhythm: Normal rate and regular rhythm.     Heart sounds: No murmur heard. Pulmonary:     Effort: Pulmonary effort is normal. No respiratory distress.     Breath sounds: Normal breath sounds.  Abdominal:     Palpations: Abdomen is soft.     Tenderness: There is no abdominal tenderness. There is no guarding or rebound.  Musculoskeletal:        General: No tenderness.  Skin:    General: Skin is warm and dry.  Neurological:     Mental Status: He is alert.     Comments: Mumbling speech.  Disoriented to time.    Psychiatric:        Behavior: Behavior normal.    ED Results / Procedures / Treatments   Labs (all labs ordered are listed, but only abnormal results are displayed) Labs Reviewed  CBC WITH DIFFERENTIAL/PLATELET - Abnormal; Notable for the following components:      Result Value   Hemoglobin 12.9 (*)    MCH 25.7 (*)    All other components within normal limits  COMPREHENSIVE METABOLIC PANEL - Abnormal; Notable for the following components:   Glucose, Bld 148 (*)    All other components within normal limits  URINALYSIS, ROUTINE W REFLEX MICROSCOPIC - Abnormal; Notable for the following components:   APPearance CLOUDY (*)    Ketones, ur 5 (*)    Leukocytes,Ua LARGE (*)    WBC, UA >50 (*)    Bacteria, UA MANY (*)    All other components within normal limits  URINE CULTURE    EKG None  Radiology IR PICC PLACEMENT RIGHT >5 YRS INC IMG GUIDE  Result Date: 07/07/2021 INDICATION: Patient with history of chronic cystitis with hematuria; central venous access requested for antibiotic therapy EXAM: RIGHT  UPPER EXTREMITY ULTRASOUND AND FLUOROSCOPIC GUIDED PICC LINE INSERTION MEDICATIONS: 1%  lidocaine to skin and subcutaneous tissue ANESTHESIA/SEDATION: None FLUOROSCOPY TIME:  Fluoroscopy Time:  36 seconds (4 mGy). COMPLICATIONS: None immediate. TECHNIQUE: The procedure, risks, benefits, and alternatives were explained to the patient and informed written consent was obtained. A timeout was performed prior to the initiation of the procedure. The right upper extremity was prepped with chlorhexidine in a sterile fashion, and a sterile drape was applied covering the operative field. Maximum barrier sterile technique with sterile gowns and gloves were used for the procedure. A timeout was performed prior to the initiation of the procedure. Local anesthesia was provided with 1% lidocaine. Under direct ultrasound guidance, the right brachial vein was accessed with a micropuncture kit after the overlying soft tissues were anesthetized with 1% lidocaine. An ultrasound image was saved for documentation purposes. A guidewire was advanced to the level of the superior caval-atrial junction for measurement purposes and the PICC line was cut to length. A peel-away sheath was placed and a 44 cm, 5 [place], single lumen was inserted to level of the superior caval-atrial junction. A post procedure spot fluoroscopic was obtained. The catheter easily aspirated and flushed and was sutured in place. A dressing was placed. The patient tolerated the procedure well without immediate post procedural complication. FINDINGS: After catheter placement, the tip lies within the superior cavoatrial junction the catheter aspirates and flushes normally and is ready for immediate use. IMPRESSION: Successful ultrasound and fluoroscopic guided placement of a right brachial vein approach, 44 cm, 5 [place], single lumen PICC with tip at the superior caval-atrial junction. The PICC line is ready for immediate use. Read by: Rowe Robert, PA-C Electronically  Signed   By: Albin Felling MD   On: 07/07/2021 09:42    Procedures Procedures   Medications Ordered in ED Medications - No data to display  ED Course  I have reviewed the triage vital signs and the nursing notes.  Pertinent labs & imaging results that were available during my care of the patient were reviewed by me and considered in my medical decision making (see chart for details).    MDM Rules/Calculators/A&P                          patient with history of chronic UTI, dementia here for evaluation of worsening confusion. Outpatient urine culture from a few days ago with [facility]. He does have a PICC line in place but does not have the resources for care at home. Will start on antibiotics, medicine consulted for admission.  Final Clinical Impression(s) / ED Diagnoses Final diagnoses:  None    Rx / DC Orders ED Discharge Orders     None        Quintella Reichert, MD 07/08/21 0205

## 2021-07-08 NOTE — Evaluation (Signed)
Occupational Therapy Evaluation Patient Details Name: Mike Nixon MRN: [id number] DOB: [birth year] Today's Date: 07/08/2021    History of Present Illness patient is a 80 year old male who presented to ED with recurrent UTI. PMH: CVA with R sided weakness,parkinsons disease, HTN,glaucoma, UTI   Clinical Impression   Patient is a 80 year old male who was admitted for above. Patient reported having caregiver at home for 10 hours a day and wife support as well. Patient declined to participate in increasing independence in self feeding and toileting tasks on this date with therapist stating that caregiver does this for patient at home. Patient has no skilled OT needs at this time. OT signing off.     Follow Up Recommendations  No OT follow up    Equipment Recommendations  None recommended by OT    Recommendations for Other Services       Precautions / Restrictions Precautions Precautions: Fall Precaution Comments: R sided weakness h/o CVA, PICC line RUE Restrictions Weight Bearing Restrictions: No      Mobility Bed Mobility Overal bed mobility: Needs Assistance Bed Mobility: Supine to Sit     Supine to sit: Min guard;HOB elevated     General bed mobility comments: patient was able to use BUE to preform supine to sit on edge of bed with increased time    Transfers Overall transfer level: Needs assistance Equipment used: Rolling walker (2 wheeled) Transfers: Sit to/from [facility] Sit to Stand: Min guard Stand pivot transfers: Min guard            Balance Overall balance assessment: Needs assistance Sitting-balance support: Feet supported Sitting balance-Leahy Scale: Good     Standing balance support: Single extremity supported Standing balance-Leahy Scale: Fair                             ADL either performed or assessed with clinical judgement   ADL Overall ADL's : At baseline                                        General ADL Comments: patient reported having caregiver for 10 hours each day and having assistance from wife as well.patient reported caregiver completes ADL for him and that he walks with wife with RW.  upon entrance to room patient was requesting for therapist to find someone to feed patient. patient was asked why he was unable to use LUE to complete self feeding tasks. patient reported that aid feeds him at home and RUE is one he uses for self feeding. patient declined to trial to use BUE for self feeding trials to mitigate barriers for patient to be able to engage in self feeding tasks on this date. patietn was educated on OT role in continum of care and importance of maintaining ability to complete portions of ADLs.patient agreed to sit up on edge of bed with min guard with HOB raised. patient was noted to use BUE to scoot fowards in bed. patient reported "urinal" upon sititng on edge of bed. patient stood with min guard at edge of bed with max A for urinal management with patient making no attempts and shaking head no when asked to use BUE to hold urinal. NT who was present in room was encouraging patient to participate stating she had seen patient use urinal earlier. patient completed transfer to recliner  in room with min guard using rolling walker. nurse made aware of patients self limiting behaviors.     Vision Patient Visual Report: No change from baseline       Perception     Praxis      Pertinent Vitals/Pain Pain Assessment: Faces Faces Pain Scale: Hurts little more Pain Location: abdomen Pain Descriptors / Indicators: Grimacing Pain Intervention(s): Limited activity within patient's tolerance;Monitored during session     Hand Dominance Right (h/o CVA effecting RUE)   Extremity/Trunk Assessment Upper Extremity Assessment Upper Extremity Assessment: RUE deficits/detail RUE Deficits / Details: patient was noted to have weakness in this UE with patient reporting pain. dominant  hand. patietn was able to complete gross grasp with hand but strength was not even between UEs. patient was able to AROM R shoulder minimally about 20 degrees with active movement of elbow and digits. RUE Coordination: decreased fine motor;decreased gross motor   Lower Extremity Assessment Lower Extremity Assessment: Defer to PT evaluation   Cervical / Trunk Assessment Cervical / Trunk Assessment: Normal   Communication Communication Communication: No difficulties   Cognition Arousal/Alertness: Awake/alert Behavior During Therapy: Flat affect Overall Cognitive Status: No family/caregiver present to determine baseline cognitive functioning                                 General Comments: patient was noted to demonstrate self limiting behaviors with no attempts made to assist with completion of ADL tasks with BUE.   General Comments       Exercises     Shoulder Instructions      Home Living Family/patient expects to be discharged to:: Private residence Living Arrangements: Spouse/significant other Available Help at Discharge: Available 24 hours/day;Personal care attendant;Family (patient reported having aid at home 10 hours a day) Type of Home: House Home Access: Level entry     Home Layout: One level     Bathroom Shower/Tub: Occupational psychologist: Standard     Home Equipment: Wheelchair - [employer] - 2 wheels;Shower seat;Bedside commode          Prior Functioning/Environment Level of Independence: Needs assistance  Gait / Transfers Assistance Needed: walking with walker at home household distances. ADL's / Homemaking Assistance Needed: home aid 10 hours/day x 7 days a week.            OT Problem List:        OT Treatment/Interventions:      OT Goals(Current goals can be found in the care plan section)    OT Frequency:     Barriers to D/C:            Co-evaluation              AM-PAC OT "6 Clicks" Daily Activity      Outcome Measure Help from another person eating meals?: Total Help from another person taking care of personal grooming?: Total Help from another person toileting, which includes using toliet, bedpan, or urinal?: A Lot Help from another person bathing (including washing, rinsing, drying)?: Total Help from another person to put on and taking off regular upper body clothing?: A Lot Help from another person to put on and taking off regular lower body clothing?: A Lot 6 Click Score: 9   End of Session Equipment Utilized During Treatment: Rolling walker Nurse Communication: Other (comment) (consulted on patient declining to participate in self feeding tasks)  Activity Tolerance: Patient tolerated  treatment well Patient left: in chair;with call bell/phone within reach (with NT in room to complete self feeding tasks)                   Time: [id number] OT Time Calculation (min): 25 min Charges:  OT General Charges $OT Visit: 1 Visit OT Evaluation $OT Eval Moderate Complexity: 1 Mod OT Treatments $Self Care/Home Management : 8-22 mins  Jackelyn Poling OTR/L, MS Acute Rehabilitation Department Office# [phone] Pager# [phone]   [place] 07/08/2021, 4:18 PM

## 2021-07-08 NOTE — Plan of Care (Signed)
Plan of care discussed.  Patient requests that his incontinence briefs be used. Urinal in reach.

## 2021-07-08 NOTE — Telephone Encounter (Signed)
Spoke with daughter Soren Fazzolari yesterday. Daughter requested trying a peer to peer with her fathers insurance. Daughter informed the quickness of the peer to peer was dependent upon both physicians schedule. Daughter informed that Dr. Alyson Ingles was in the [place] on Thursday, Friday, and  Monday and the process could be lengthy therefore delaying treatment. Dr. Alyson Ingles advised the family to try to get patient admitted through the ER if they were not able to take patient to the outpatient clinic to get treatment and home health was no longer an option. Daughter advised again to try to go through ER to limit delay in treatment. Daughter voiced understanding.

## 2021-07-09 ENCOUNTER — Inpatient Hospital Stay (HOSPITAL_COMMUNITY): Payer: Medicare HMO

## 2021-07-09 DIAGNOSIS — R079 Chest pain, unspecified: Secondary | ICD-10-CM

## 2021-07-09 LAB — CBC
HCT: 37.6 % — ABNORMAL LOW (ref 39.0–52.0)
Hemoglobin: 11.8 g/dL — ABNORMAL LOW (ref 13.0–17.0)
MCH: 25.6 pg — ABNORMAL LOW (ref 26.0–34.0)
MCHC: 31.4 g/dL (ref 30.0–36.0)
MCV: 81.6 fL (ref 80.0–100.0)
Platelets: 200 10*3/uL (ref 150–400)
RBC: 4.61 MIL/uL (ref 4.22–5.81)
RDW: 14.4 % (ref 11.5–15.5)
WBC: 6 10*3/uL (ref 4.0–10.5)
nRBC: 0 % (ref 0.0–0.2)

## 2021-07-09 LAB — BASIC METABOLIC PANEL
Anion gap: 8 (ref 5–15)
BUN: 15 mg/dL (ref 8–23)
CO2: 28 mmol/L (ref 22–32)
Calcium: 9.3 mg/dL (ref 8.9–10.3)
Chloride: 101 mmol/L (ref 98–111)
Creatinine, Ser: 1.12 mg/dL (ref 0.61–1.24)
GFR, Estimated: >60 mL/min (ref >60)
Glucose, Bld: 103 mg/dL — ABNORMAL HIGH (ref 70–99)
Potassium: 3.5 mmol/L (ref 3.5–5.1)
Sodium: 137 mmol/L (ref 135–145)

## 2021-07-09 LAB — PROCALCITONIN: Procalcitonin: <0.1 ng/mL

## 2021-07-09 LAB — GLUCOSE, CAPILLARY
Glucose-Capillary: 89 mg/dL (ref 70–99)
Glucose-Capillary: 136 mg/dL — ABNORMAL HIGH (ref 70–99)
Glucose-Capillary: 77 mg/dL (ref 70–99)
Glucose-Capillary: 128 mg/dL — ABNORMAL HIGH (ref 70–99)

## 2021-07-09 MED ORDER — POLYETHYLENE GLYCOL 3350 17 G PO PACK
17.0000 g | PACK | Freq: Every day | ORAL | Status: DC | PRN
  Administered 2021-07-09: 17 g via ORAL
  Filled 2021-07-09: qty 1

## 2021-07-09 NOTE — Plan of Care (Signed)

## 2021-07-09 NOTE — Evaluation (Signed)
Physical Therapy Evaluation Patient Details Name: Mike Nixon MRN: [id number] DOB: August 25, 1941 Today's Date: 07/09/2021   History of Present Illness  patient is a 80 year old male who presented to ED with recurrent UTI. PMH: CVA with R sided weakness,parkinsons disease, HTN,glaucoma, UTI  Clinical Impression  Pt admitted with above diagnosis. Pt able to state he uses RW at home and someone assists him OOB, but shakes head yes to a&o questions so unsure of baseline. Pt currently requiring min guard-min assist with mobility, limited ambulation in room with RW. Recommending return home with 24 hr support and HHPT. Pt currently with functional limitations due to the deficits listed below (see PT Problem List). Pt will benefit from skilled PT to increase their independence and safety with mobility to allow discharge to the venue listed below.        Follow Up Recommendations Supervision/Assistance - 24 hour;Home health PT    Equipment Recommendations  None recommended by PT    Recommendations for Other Services       Precautions / Restrictions Precautions Precautions: Fall Precaution Comments: R sided weakness h/o CVA, PICC line RUE Restrictions Weight Bearing Restrictions: No      Mobility  Bed Mobility Overal bed mobility: Needs Assistance Bed Mobility: Supine to Sit  Supine to sit: Min assist;HOB elevated  General bed mobility comments: pt cued to reach for bedrails to upright trunk and scoot out to EOB, reaches for therapist's hand to upright trunk, min A to scoot out to EOB and place feet on floor    Transfers Overall transfer level: Needs assistance Equipment used: Rolling walker (2 wheeled) Transfers: Sit to/from [facility] Sit to Stand: Min guard Stand pivot transfers: Min guard  General transfer comment: min guard to steady with power to stand, slightly unsteady but without LOB, VCs for sequencing and hand placement  Ambulation/Gait Ambulation/Gait  assistance: Min assist   Assistive device: Rolling walker (2 wheeled) Gait Pattern/deviations: Step-to pattern;Narrow base of support Gait velocity: decreased  General Gait Details: limited steps at bedside, able to clear each foot from floor, maintains narrow BOS, min A due to generally unsteady  Stairs            Wheelchair Mobility    Modified Rankin (Stroke Patients Only)       Balance Overall balance assessment: Needs assistance Sitting-balance support: Feet supported Sitting balance-Leahy Scale: Good  Standing balance-Leahy Scale: Fair Standing balance comment: able to release UEs to use urinal with min guard assist        Pertinent Vitals/Pain Pain Assessment: Faces Faces Pain Scale: Hurts a little bit Pain Location: abdomen Pain Descriptors / Indicators: Grimacing Pain Intervention(s): Limited activity within patient's tolerance;Monitored during session;Repositioned    Home Living Family/patient expects to be discharged to:: Private residence Living Arrangements: Spouse/significant other Available Help at Discharge: Available 24 hours/day;Personal care attendant;Family (patient reported having aid at home 10 hours a day) Type of Home: House Home Access: Level entry     Home Layout: One level Home Equipment: Wheelchair - [employer] - 2 wheels;Shower seat;Bedside commode      Prior Function Level of Independence: Needs assistance   Gait / Transfers Assistance Needed: walking with walker at home household distances.  ADL's / Homemaking Assistance Needed: home aid 10 hours/day x 7 days a week.  Comments: information obtained per chart review, pt with difficulty verbalizing PLOF- only states he uses RW and someone helps him get OOB.     Hand Dominance   Dominant Hand:  Right (h/o CVA effecting RUE)    Extremity/Trunk Assessment   Upper Extremity Assessment Upper Extremity Assessment: Defer to OT evaluation    Lower Extremity Assessment Lower  Extremity Assessment: RLE deficits/detail;LLE deficits/detail;Generalized weakness RLE Deficits / Details: AROM WNL, MMT 3/5 but unsure if due to cognition and pt releasing with pressure, strength grossly 3+/5 LLE Deficits / Details: AROM WNL, quad strength 4/5 MMT, all other 3/5 but unsure if due to cognition and pt releasing with pressure, strength grossly 3+/5    Cervical / Trunk Assessment Cervical / Trunk Assessment: Normal  Communication   Communication: Expressive difficulties  Cognition Arousal/Alertness: Awake/alert Behavior During Therapy: Flat affect Overall Cognitive Status: No family/caregiver present to determine baseline cognitive functioning   General Comments: Pt states name appropriately, shakes head "yes" to all further questions, able to state "tummy hurts" and ask for urinal to void bladder.      General Comments      Exercises     Assessment/Plan    PT Assessment Patient needs continued PT services  PT Problem List Decreased strength;Decreased activity tolerance;Decreased balance;Decreased mobility;Decreased coordination;Decreased cognition;Decreased knowledge of use of DME       PT Treatment Interventions DME instruction;Gait training;Functional mobility training;Therapeutic activities;Therapeutic exercise;Balance training;Patient/family education    PT Goals (Current goals can be found in the Care Plan section)  Acute Rehab PT Goals Patient Stated Goal: get in chair PT Goal Formulation: With patient Time For Goal Achievement: 07/23/21 Potential to Achieve Goals: Good    Frequency Min 2X/week   Barriers to discharge        Co-evaluation               AM-PAC PT "6 Clicks" Mobility  Outcome Measure Help needed turning from your back to your side while in a flat bed without using bedrails?: A Little Help needed moving from lying on your back to sitting on the side of a flat bed without using bedrails?: A Little Help needed moving to and from  a bed to a chair (including a wheelchair)?: A Little Help needed standing up from a chair using your arms (e.g., wheelchair or bedside chair)?: A Little Help needed to walk in hospital room?: A Little Help needed climbing 3-5 steps with a railing? : A Lot 6 Click Score: 17    End of Session Equipment Utilized During Treatment: Gait belt Activity Tolerance: Patient tolerated treatment well Patient left: in chair;with call bell/phone within reach;with chair alarm set Nurse Communication: Mobility status;Other (comment) (breakfast tray, used urinal) PT Visit Diagnosis: Unsteadiness on feet (R26.81);Other abnormalities of gait and mobility (R26.89);Muscle weakness (generalized) (M62.81);Other symptoms and signs involving the nervous system [id number])    Time: [id number] PT Time Calculation (min) (ACUTE ONLY): 17 min   Charges:   PT Evaluation $PT Eval Low Complexity: 1 Low           Tori Adrieanna Boteler PT, DPT 07/09/21, 9:48 AM

## 2021-07-09 NOTE — Progress Notes (Addendum)
PROGRESS NOTE    Lazer Meloche  [id number] DOB: December 22, 1940 DOA: 07/07/2021 PCP: [name], MD   Brief Narrative: HPI per Dr. Marcello Moores on 07/08/2021  Joshwa Loebs is a 80 y.o. 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  Of note patient has picc line placed  8/11 for planned home infusion. Per family patient is more confused of late, but patient has had no complaints of fever, chills, n/v/d has + dysuria which is a chronic issues. Patient on evaluation states he has sharp  chest discomfort central chest not associated with radiation note made worse with movement or deep breathing, note it started 30 minutes ago after eating and initially started in his stomach sharp and crampy and then travelled up to his chest. He states it is a 8/10 at this time. He note no sob, no palpitations or nausea associated with this.  Assessment & Plan:   Active Problems:   Complicated UTI (urinary tract infection)   UTI (urinary tract infection)   #1 MDRO E COLI UTI -patient admitted with increased confusion.  Urine culture from 07/04/2021 with multiply drug-resistant E. coli on meropenem.  He does not have a Foley catheter but he is mostly bedridden with history of stroke and right-sided weakness and Parkinson's disease. Meropenem for 5 days.  #2 #2 Parkinson's disease continue Sinemet as he was taking at home  #3 history of stroke with right-sided weakness on Plavix  #4 history of essential hypertension  #5 hyperlipidemia on statin  #6 history of glaucoma  #7 diet-controlled diabetes hemoglobin A1c was 6.4 at that time Will check A1c this admission.    #8 constipation we will put him on MiraLAX and senna     Estimated body  mass index is 22.89 kg/m as calculated from the following:   Height as of this encounter: '5\' 7"'$  (1.702 m).   Weight as of this encounter: 66.3 kg.  DVT prophylaxis: Lovenox  code Status: Full code  family Communication: Discussed with patient's wife on the phone  disposition Plan:  Status is: Inpatient  Remains inpatient appropriate because:IV treatments appropriate due to intensity of illness or inability to take PO  Dispo: The patient is from: Home              Anticipated d/c is to: Home              Patient currently is not medically stable to d/c.   Difficult to place patient No       Consultants:  Dw urology on phone  Procedures:none Antimicrobials: merepenem  Subjective: Resting in bed     Objective: Vitals:   07/08/21 0957 07/08/21 1234 07/08/21 2201 07/09/21 0342  BP: 112/74 125/75 105/71 123/74  Pulse: 62 63 (!) 59 67  Resp: '14 16 16 14  '$ Temp: (!) 97.2 F (36.2 C)  97.8 F (36.6 C) 98.1 F (36.7 C)  TempSrc:   Oral Oral  SpO2: 100% 100% 99% 98%  Weight:      Height:        Intake/Output Summary (Last 24 hours) at 07/09/2021 1113 Last data filed at 07/09/2021 0740 Gross per 24 hour  Intake 950 ml  Output 875 ml  Net 75 ml   Filed Weights   07/07/21  2156 07/08/21 0330  Weight: 76.7 kg 66.3 kg    Examination:  General exam: Appears calm and comfortable  Respiratory system: Clear to auscultation. Respiratory effort normal. Cardiovascular system: S1 & S2 heard, RRR. No JVD, murmurs, rubs, gallops or clicks. No pedal edema. Gastrointestinal system: Abdomen is nondistended, soft and nontender. No organomegaly or masses felt. Normal bowel sounds heard. Central nervous system: Alert and oriented. Right sided weakness  Extremities: Symmetric 5 x 5 power. Skin: No rashes, lesions or ulcers Psychiatry: Judgement and insight appear normal. Mood & affect appropriate.     Data Reviewed: I have personally reviewed following labs and imaging  studies  CBC: Recent Labs  Lab 07/07/21 2237 07/08/21 0921 07/09/21 0201  WBC 5.7 5.3 6.0  NEUTROABS 3.2  --   --   HGB 12.9* 12.0* 11.8*  HCT 41.2 38.6* 37.6*  MCV 82.2 82.0 81.6  PLT 218 208 [id number]   Basic Metabolic Panel: Recent Labs  Lab 07/07/21 2237 07/08/21 1046 07/09/21 0201  NA 140  --  137  K 3.6  --  3.5  CL 104  --  101  CO2 29  --  28  GLUCOSE 148*  --  103*  BUN 16  --  15  CREATININE 1.15 1.01 1.12  CALCIUM 9.6  --  9.3   GFR: Estimated Creatinine Clearance: 50 mL/min (by C-G formula based on SCr of 1.12 mg/dL). Liver Function Tests: Recent Labs  Lab 07/07/21 2237  AST 17  ALT 7  ALKPHOS 67  BILITOT 0.9  PROT 6.9  ALBUMIN 4.1   No results for input(s): LIPASE, AMYLASE in the last 168 hours. No results for input(s): AMMONIA in the last 168 hours. Coagulation Profile: No results for input(s): INR, PROTIME in the last 168 hours. Cardiac Enzymes: No results for input(s): CKTOTAL, CKMB, CKMBINDEX, TROPONINI in the last 168 hours. BNP (last 3 results) No results for input(s): PROBNP in the last 8760 hours. HbA1C: Recent Labs    07/08/21 1354  HGBA1C 6.8*   CBG: Recent Labs  Lab 07/08/21 1005 07/08/21 1257 07/08/21 1632 07/08/21 2204 07/09/21 0734  GLUCAP 100* 143* 111* 104* 89   Lipid Profile: Recent Labs    07/08/21 1354  CHOL 133  HDL 47  LDLCALC 75  TRIG 56  CHOLHDL 2.8   Thyroid Function Tests: No results for input(s): TSH, T4TOTAL, FREET4, T3FREE, THYROIDAB in the last 72 hours. Anemia Panel: No results for input(s): VITAMINB12, FOLATE, FERRITIN, TIBC, IRON, RETICCTPCT in the last 72 hours. Sepsis Labs: Recent Labs  Lab 07/08/21 1354 07/09/21 0201  PROCALCITON <0.10 <0.10    Recent Results (from the past 240 hour(s))  Microscopic Examination     Status: Abnormal   Collection Time: 07/04/21 11:27 AM   Urine  Result Value Ref Range Status   WBC, UA >30 (A) 0 - 5 /hpf Final   RBC 3-10 (A) 0 - 2 /hpf Final    Epithelial Cells (non renal) 0-10 0 - 10 /hpf Final   Renal Epithel, UA None seen None seen /hpf Final   Mucus, UA Present Not Estab. Final   Bacteria, UA Many (A) None seen/Few Final  Urine Culture     Status: Abnormal   Collection Time: 07/04/21 11:35 AM   Specimen: Urine   UR  Result Value Ref Range Status   Urine Culture, Routine Final report (A)  Final   Organism ID, Bacteria Comment (A)  Final    Comment: Escherichia coli, identified by an  automated biochemical system. 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 Greater than 100,000 colony forming units per mL    ORGANISM ID, BACTERIA Not applicable  Final   Antimicrobial Susceptibility Comment  Final    Comment:       ** S = Susceptible; I = Intermediate; R = Resistant **                    P = Positive; N = Negative             MICS are expressed in micrograms per mL    Antibiotic                 RSLT#1    RSLT#2    RSLT#3    RSLT#4 Amoxicillin/Clavulanic Acid    I Ampicillin                     R Cefazolin                      R Cefepime                       R Ceftriaxone                    R Cefuroxime                     R Ciprofloxacin                  R Ertapenem                      S Gentamicin                     S Imipenem                       S Levofloxacin                   R Meropenem                      S Nitrofurantoin                 I Piperacillin/Tazobactam        S Tetracycline                   R Tobramycin                     R Trimethoprim/Sulfa             R   Urine Culture     Status: Abnormal (Preliminary result)   Collection Time: 07/08/21  1:29 AM   Specimen: Urine, Clean Catch  Result Value Ref Range Status   Specimen Description   Final    URINE, CLEAN CATCH Performed at [facility], [place] [address], [place], [place] [address]    Special Requests   Final    NONE Performed at [facility], [place]  [address], [place],  [address]    Culture >=100,000 COLONIES/mL ESCHERICHIA COLI (A)  Final   Report Status PENDING  Incomplete  SARS CORONAVIRUS 2 (TAT 6-24 HRS) Nasopharyngeal Nasopharyngeal Swab     Status: None   Collection Time: 07/08/21  2:02 AM   Specimen: Nasopharyngeal Swab  Result Value Ref Range Status   SARS Coronavirus 2 NEGATIVE NEGATIVE Final    Comment: (NOTE) SARS-CoV-2 target nucleic acids are NOT DETECTED.  The SARS-CoV-2 RNA is generally detectable in upper and lower respiratory specimens during the acute phase of infection. Negative results do not preclude SARS-CoV-2 infection, do not rule out co-infections with other pathogens, and should not be used as the sole basis for treatment or other patient management decisions. Negative results must be combined with clinical observations, patient history, and epidemiological information. The expected result is Negative.  Fact Sheet for Patients: SugarRoll.be  Fact Sheet for Healthcare Providers: https://www.[employer].com/  This test is not yet approved or cleared by the [place] FDA and  has been authorized for detection and/or diagnosis of SARS-CoV-2 by FDA under an Emergency Use Authorization (EUA). This EUA will remain  in effect (meaning this test can be used) for the duration of the COVID-19 declaration under Se ction 564(b)(1) of the Act, 21 U.S.C. section 360bbb-3(b)(1), unless the authorization is terminated or revoked sooner.  Performed at [facility], [place] [address] [place], [place] [address]          Radiology Studies: DG CHEST PORT 1 VIEW  Result Date: 07/08/2021 CLINICAL DATA:  Chest pain EXAM: PORTABLE CHEST 1 VIEW COMPARISON:  05/09/2020 FINDINGS: Interval placement of a right-sided PICC line with distal tip terminating at the superior cavoatrial junction. Heart size is within normal limits. No focal airspace consolidation,  pleural effusion, or pneumothorax. IMPRESSION: Satisfactory positioning of right-sided PICC line. No acute cardiopulmonary findings. Electronically Signed   By: Davina Poke D.O.   On: 07/08/2021 14:38        Scheduled Meds:  atorvastatin  40 mg Oral QPM   Carbidopa-Levodopa ER  3 capsule Oral TID WC & HS   Chlorhexidine Gluconate Cloth  6 each Topical Daily   clopidogrel  75 mg Oral Daily   enoxaparin (LOVENOX) injection  40 mg Subcutaneous Daily   hydrochlorothiazide  25 mg Oral Daily   Istradefylline  40 mg Oral Daily   metFORMIN  500 mg Oral Q breakfast   pantoprazole  40 mg Oral Daily   rivastigmine  4.6 mg Transdermal Daily   Continuous Infusions:  meropenem (MERREM) IV 1 g (07/09/21 0219)     LOS: 1 day    Time spent: 70 min    Georgette Shell, MD 07/09/2021, 11:13 AM

## 2021-07-09 NOTE — Plan of Care (Signed)
  Problem: Coping: Goal: Level of anxiety will decrease Outcome: Progressing   Problem: Pain Managment: Goal: General experience of comfort will improve Outcome: Progressing   Problem: Safety: Goal: Ability to remain free from injury will improve Outcome: Progressing   

## 2021-07-10 ENCOUNTER — Inpatient Hospital Stay (HOSPITAL_COMMUNITY): Payer: Medicare HMO

## 2021-07-10 LAB — COMPREHENSIVE METABOLIC PANEL
ALT: 6 U/L (ref 0–44)
AST: 15 U/L (ref 15–41)
Albumin: 3.6 g/dL (ref 3.5–5.0)
Alkaline Phosphatase: 62 U/L (ref 38–126)
Anion gap: 8 (ref 5–15)
BUN: 13 mg/dL (ref 8–23)
CO2: 29 mmol/L (ref 22–32)
Calcium: 9.7 mg/dL (ref 8.9–10.3)
Chloride: 99 mmol/L (ref 98–111)
Creatinine, Ser: 1.21 mg/dL (ref 0.61–1.24)
GFR, Estimated: >60 mL/min (ref >60)
Glucose, Bld: 147 mg/dL — ABNORMAL HIGH (ref 70–99)
Potassium: 3.7 mmol/L (ref 3.5–5.1)
Sodium: 136 mmol/L (ref 135–145)
Total Bilirubin: 1 mg/dL (ref 0.3–1.2)
Total Protein: 6.3 g/dL — ABNORMAL LOW (ref 6.5–8.1)

## 2021-07-10 LAB — GLUCOSE, CAPILLARY
Glucose-Capillary: 120 mg/dL — ABNORMAL HIGH (ref 70–99)
Glucose-Capillary: 115 mg/dL — ABNORMAL HIGH (ref 70–99)
Glucose-Capillary: 136 mg/dL — ABNORMAL HIGH (ref 70–99)
Glucose-Capillary: 151 mg/dL — ABNORMAL HIGH (ref 70–99)

## 2021-07-10 LAB — CBC
HCT: 41.5 % (ref 39.0–52.0)
Hemoglobin: 12.9 g/dL — ABNORMAL LOW (ref 13.0–17.0)
MCH: 25.3 pg — ABNORMAL LOW (ref 26.0–34.0)
MCHC: 31.1 g/dL (ref 30.0–36.0)
MCV: 81.4 fL (ref 80.0–100.0)
Platelets: 210 10*3/uL (ref 150–400)
RBC: 5.1 MIL/uL (ref 4.22–5.81)
RDW: 14.3 % (ref 11.5–15.5)
WBC: 5.4 10*3/uL (ref 4.0–10.5)
nRBC: 0 % (ref 0.0–0.2)

## 2021-07-10 LAB — HEMOGLOBIN A1C
Hgb A1c MFr Bld: 6.8 % — ABNORMAL HIGH (ref 4.8–5.6)
Mean Plasma Glucose: 148.46 mg/dL

## 2021-07-10 LAB — PROCALCITONIN: Procalcitonin: <0.1 ng/mL

## 2021-07-10 LAB — LIPASE, BLOOD: Lipase: 31 U/L (ref 11–51)

## 2021-07-10 MED ORDER — BISACODYL 10 MG RE SUPP
10.0000 mg | Freq: Once | RECTAL | Status: AC
  Administered 2021-07-10: 10 mg via RECTAL
  Filled 2021-07-10: qty 1

## 2021-07-10 MED ORDER — SODIUM CHLORIDE 0.9 % IV SOLN: INTRAVENOUS | Status: DC

## 2021-07-10 MED ORDER — SORBITOL 70 % SOLN
960.0000 mL | TOPICAL_OIL | Freq: Once | ORAL | Status: AC
  Administered 2021-07-10: 960 mL via RECTAL
  Filled 2021-07-10: qty 473

## 2021-07-10 NOTE — Progress Notes (Signed)
PROGRESS NOTE    Mike Nixon  [id number] DOB: 06/13/1941 DOA: 07/07/2021 PCP: [name], MD   Brief Narrative: HPI per Dr. Marcello Moores on 07/08/2021  Mike Nixon is a 80 y.o. 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  Of note patient has picc line placed  8/11 for planned home infusion. Per family patient is more confused of late, but patient has had no complaints of fever, chills, n/v/d has + dysuria which is a chronic issues. Patient on evaluation states he has sharp  chest discomfort central chest not associated with radiation note made worse with movement or deep breathing, note it started 30 minutes ago after eating and initially started in his stomach sharp and crampy and then travelled up to his chest. He states it is a 8/10 at this time. He note no sob, no palpitations or nausea associated with this.  Assessment & Plan:   Active Problems:   Chest pain   Complicated UTI (urinary tract infection)   UTI (urinary tract infection)   #1 MDRO E COLI UTI -patient admitted with increased confusion.  Urine culture from 07/04/2021 with multiply drug-resistant E. coli on meropenem.  He does not have a Foley catheter but he is mostly bedridden with history of stroke and right-sided weakness and Parkinson's disease. Meropenem for 5 days.  Then he will be discharged home with his family.  #2 #2 Parkinson's disease continue Sinemet as he was taking at home  #3 history of stroke with right-sided weakness on Plavix  #4 history of essential hypertension  #5 hyperlipidemia on statin  #6 history of glaucoma  #7 diet-controlled diabetes hemoglobin A1c was 6.4 at that time Will check A1c this admission.   #8 constipation we  will put him on MiraLAX and senna  #9 abdominal pain likely secondary to constipation we will order right upper quadrant ultrasound CBC CMP and lipase.  Smog enema ordered.     Estimated body mass index is 23.1 kg/m as calculated from the following:   Height as of this encounter: '5\' 7"'$  (1.702 m).   Weight as of this encounter: 66.9 kg.  DVT prophylaxis: Lovenox  code Status: Full code  family Communication: Discussed with patient's wife on the phone  disposition Plan:  Status is: Inpatient  Remains inpatient appropriate because:IV treatments appropriate due to intensity of illness or inability to take PO  Dispo: The patient is from: Home              Anticipated d/c is to: Home              Patient currently is not medically stable to d/c.   Difficult to place patient No       Consultants:  Dw urology on phone  Procedures:none Antimicrobials: merepenem  Subjective: Resting in bed   KUB with mild fecal loading in the descending colon and rectum continues to complain of abdominal pain more in the right upper quadrant Smog enema ordered   Objective: Vitals:   07/09/21 1410 07/09/21 2137 07/10/21 0508 07/10/21 0803  BP: 120/82 132/82 111/76 118/80  Pulse: 66 68 74 68  Resp: '18 15 16 20  '$ Temp: 98.1 F (36.7 C) 97.8 F (36.6 C) (!) 97.4 F (36.3 C)   TempSrc: Oral  Oral Oral   SpO2: 100% 99% 96% 98%  Weight:   66.9 kg   Height:        Intake/Output Summary (Last 24 hours) at 07/10/2021 1040 Last data filed at 07/10/2021 1000 Gross per 24 hour  Intake 946.25 ml  Output 1300 ml  Net -353.75 ml    Filed Weights   07/07/21 2156 07/08/21 0330 07/10/21 0508  Weight: 76.7 kg 66.3 kg 66.9 kg    Examination:  General exam: Appears calm and comfortable  Respiratory system: Clear to auscultation. Respiratory effort normal. Cardiovascular system: S1 & S2 heard, RRR. No JVD, murmurs, rubs, gallops or clicks. No pedal edema. Gastrointestinal system: Abdomen is  nondistended, soft and tender  No organomegaly or masses felt. Normal bowel sounds heard. Central nervous system: Alert and oriented. Right sided weakness  Extremities: Symmetric 5 x 5 power. Skin: No rashes, lesions or ulcers Psychiatry: Judgement and insight appear normal. Mood & affect appropriate.     Data Reviewed: I have personally reviewed following labs and imaging studies  CBC: Recent Labs  Lab 07/07/21 2237 07/08/21 0921 07/09/21 0201  WBC 5.7 5.3 6.0  NEUTROABS 3.2  --   --   HGB 12.9* 12.0* 11.8*  HCT 41.2 38.6* 37.6*  MCV 82.2 82.0 81.6  PLT 218 208 [id number]    Basic Metabolic Panel: Recent Labs  Lab 07/07/21 2237 07/08/21 1046 07/09/21 0201  NA 140  --  137  K 3.6  --  3.5  CL 104  --  101  CO2 29  --  28  GLUCOSE 148*  --  103*  BUN 16  --  15  CREATININE 1.15 1.01 1.12  CALCIUM 9.6  --  9.3    GFR: Estimated Creatinine Clearance: 50 mL/min (by C-G formula based on SCr of 1.12 mg/dL). Liver Function Tests: Recent Labs  Lab 07/07/21 2237  AST 17  ALT 7  ALKPHOS 67  BILITOT 0.9  PROT 6.9  ALBUMIN 4.1    No results for input(s): LIPASE, AMYLASE in the last 168 hours. No results for input(s): AMMONIA in the last 168 hours. Coagulation Profile: No results for input(s): INR, PROTIME in the last 168 hours. Cardiac Enzymes: No results for input(s): CKTOTAL, CKMB, CKMBINDEX, TROPONINI in the last 168 hours. BNP (last 3 results) No results for input(s): PROBNP in the last 8760 hours. HbA1C: Recent Labs    07/08/21 1354  HGBA1C 6.8*    CBG: Recent Labs  Lab 07/09/21 0734 07/09/21 1148 07/09/21 1645 07/09/21 2134 07/10/21 0725  GLUCAP 89 136* 77 128* 120*    Lipid Profile: Recent Labs    07/08/21 1354  CHOL 133  HDL 47  LDLCALC 75  TRIG 56  CHOLHDL 2.8    Thyroid Function Tests: No results for input(s): TSH, T4TOTAL, FREET4, T3FREE, THYROIDAB in the last 72 hours. Anemia Panel: No results for input(s): VITAMINB12, FOLATE,  FERRITIN, TIBC, IRON, RETICCTPCT in the last 72 hours. Sepsis Labs: Recent Labs  Lab 07/08/21 1354 07/09/21 0201 07/10/21 0412  PROCALCITON <0.10 <0.10 <0.10     Recent Results (from the past 240 hour(s))  Microscopic Examination     Status: Abnormal   Collection Time: 07/04/21 11:27 AM   Urine  Result Value Ref Range Status   WBC, UA >30 (A) 0 - 5 /hpf Final   RBC 3-10 (A) 0 - 2 /hpf Final   Epithelial Cells (non renal) 0-10 0 - 10 /hpf Final   Renal Epithel, UA None seen  None seen /hpf Final   Mucus, UA Present Not Estab. Final   Bacteria, UA Many (A) None seen/Few Final  Urine Culture     Status: Abnormal   Collection Time: 07/04/21 11:35 AM   Specimen: Urine   UR  Result Value Ref Range Status   Urine Culture, Routine Final report (A)  Final   Organism ID, Bacteria Comment (A)  Final    Comment: Escherichia coli, identified by an automated biochemical system. 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 Greater than 100,000 colony forming units per mL    ORGANISM ID, BACTERIA Not applicable  Final   Antimicrobial Susceptibility Comment  Final    Comment:       ** S = Susceptible; I = Intermediate; R = Resistant **                    P = Positive; N = Negative             MICS are expressed in micrograms per mL    Antibiotic                 RSLT#1    RSLT#2    RSLT#3    RSLT#4 Amoxicillin/Clavulanic Acid    I Ampicillin                     R Cefazolin                      R Cefepime                       R Ceftriaxone                    R Cefuroxime                     R Ciprofloxacin                  R Ertapenem                      S Gentamicin                     S Imipenem                       S Levofloxacin                   R Meropenem                      S Nitrofurantoin                 I Piperacillin/Tazobactam        S Tetracycline                   R Tobramycin                     R Trimethoprim/Sulfa              R   Urine Culture     Status: Abnormal (Preliminary result)   Collection Time: 07/08/21  1:29 AM   Specimen: Urine, Clean Catch  Result Value Ref Range Status   Specimen Description   Final    URINE, CLEAN CATCH Performed at [facility], [address] [place], [place] [address]    Special Requests   Final    NONE Performed at [facility], [place] [address] [place], [place] [address]    Culture >=100,000 COLONIES/mL ESCHERICHIA COLI (A)  Final   Report Status PENDING  Incomplete  SARS CORONAVIRUS 2 (TAT 6-24 HRS) Nasopharyngeal Nasopharyngeal Swab     Status: None   Collection Time: 07/08/21  2:02 AM   Specimen: Nasopharyngeal Swab  Result Value Ref Range Status   SARS Coronavirus 2 NEGATIVE NEGATIVE Final    Comment: (NOTE) SARS-CoV-2 target nucleic acids are NOT DETECTED.  The SARS-CoV-2 RNA is generally detectable in upper and lower respiratory specimens during the acute phase of infection. Negative results do not preclude SARS-CoV-2 infection, do not rule out co-infections with other pathogens, and should not be used as the sole basis for treatment or other patient management decisions. Negative results must be combined with clinical observations, patient history, and epidemiological information. The expected result is Negative.  Fact Sheet for Patients: SugarRoll.be  Fact Sheet for Healthcare Providers: https://www.woods-.com/  This test is not yet approved or cleared by the [place] FDA and  has been authorized for detection and/or diagnosis of SARS-CoV-2 by FDA under an Emergency Use Authorization (EUA). This EUA will remain  in effect (meaning this test can be used) for the duration of the COVID-19 declaration under Se ction 564(b)(1) of the Act, 21 U.S.C. section 360bbb-3(b)(1), unless the authorization is terminated or revoked sooner.  Performed at [facility], [place] [address] [place], [place] [address]   Culture, blood (routine x 2)     Status: None (Preliminary result)   Collection Time: 07/08/21  2:20 PM   Specimen: Left Antecubital; Blood  Result Value Ref Range Status   Specimen Description   Final    LEFT ANTECUBITAL Performed at [facility] [address], [place], [place] [address]    Special Requests   Final    BOTTLES DRAWN AEROBIC AND ANAEROBIC Blood Culture adequate volume Performed at [facility] [address] [place], [place] [address]    Culture   Final    NO GROWTH 2 DAYS Performed at [facility] [address], [place], [place] [address]    Report Status PENDING  Incomplete  Culture, blood (routine x 2)     Status: None (Preliminary result)   Collection Time: 07/08/21  2:29 PM   Specimen: BLOOD LEFT HAND  Result Value Ref Range Status   Specimen Description   Final    BLOOD LEFT HAND Performed at [facility] [address] [place], [place] [address]    Special Requests   Final    BOTTLES DRAWN AEROBIC ONLY Blood Culture results may not be optimal due to an inadequate volume of blood received in culture bottles Performed at [facility] [address] [place], [place] [address]    Culture   Final    NO GROWTH 2 DAYS Performed at [facility] [address] [place], [place] [address]    Report Status PENDING  Incomplete          Radiology Studies: DG Abd 1 View  Result Date: 07/09/2021 CLINICAL DATA:  Abdominal distension EXAM: ABDOMEN - 1 VIEW COMPARISON:  None. FINDINGS: No free air, portal venous gas, or pneumatosis. Mild fecal loading in the descending colon and rectum. No bowel obstruction. No renal or ureteral stones noted.  No other acute abnormalities. IMPRESSION: No cause for bowel distension identified.  No bowel obstruction. Electronically Signed   By: Dorise Bullion III M.D.   On:  07/09/2021 17:36   DG CHEST PORT 1 VIEW  Result Date: 07/08/2021 CLINICAL DATA:  Chest pain EXAM: PORTABLE CHEST 1 VIEW COMPARISON:  05/09/2020 FINDINGS: Interval placement of a right-sided PICC line with distal tip terminating at the superior cavoatrial junction. Heart size is within normal limits. No focal airspace consolidation, pleural effusion, or pneumothorax. IMPRESSION: Satisfactory positioning of right-sided PICC line. No acute cardiopulmonary findings. Electronically Signed   By: Davina Poke D.O.   On: 07/08/2021 14:38        Scheduled Meds:  atorvastatin  40 mg Oral QPM   Carbidopa-Levodopa ER  3 capsule Oral TID WC & HS   Chlorhexidine Gluconate Cloth  6 each Topical Daily   clopidogrel  75 mg Oral Daily   enoxaparin (LOVENOX) injection  40 mg Subcutaneous Daily   Istradefylline  40 mg Oral Daily   pantoprazole  40 mg Oral Daily   rivastigmine  4.6 mg Transdermal Daily   Continuous Infusions:  sodium chloride 75 mL/hr at 07/10/21 0939   meropenem (MERREM) IV 1 g (07/10/21 0224)     LOS: 2 days    Time spent: 62 min    Georgette Shell, MD 07/10/2021, 10:40 AM

## 2021-07-10 NOTE — Plan of Care (Signed)
  Problem: Coping: Goal: Level of anxiety will decrease Outcome: Progressing   Problem: Elimination: Goal: Will not experience complications related to bowel motility Outcome: Progressing   Problem: Pain Managment: Goal: General experience of comfort will improve Outcome: Progressing   Problem: Safety: Goal: Ability to remain free from injury will improve Outcome: Progressing   

## 2021-07-11 DIAGNOSIS — R140 Abdominal distension (gaseous): Secondary | ICD-10-CM

## 2021-07-11 LAB — GLUCOSE, CAPILLARY
Glucose-Capillary: 117 mg/dL — ABNORMAL HIGH (ref 70–99)
Glucose-Capillary: 168 mg/dL — ABNORMAL HIGH (ref 70–99)
Glucose-Capillary: 111 mg/dL — ABNORMAL HIGH (ref 70–99)
Glucose-Capillary: 120 mg/dL — ABNORMAL HIGH (ref 70–99)

## 2021-07-11 LAB — URINE CULTURE: Culture: >=100000 — AB

## 2021-07-11 MED ORDER — ISTRADEFYLLINE 20 MG PO TABS
20.0000 mg | ORAL_TABLET | Freq: Every day | ORAL | Status: DC
  Administered 2021-07-12: 20 mg via ORAL

## 2021-07-11 NOTE — Plan of Care (Signed)

## 2021-07-11 NOTE — Progress Notes (Signed)
Mobility Specialist - Progress Note    07/11/21 1427  Mobility  Activity Ambulated in hall  Level of Assistance Minimal assist, patient does 75% or more  Assistive Device Front wheel walker  Distance Ambulated (ft) 350 ft  Mobility Ambulated with assistance in hallway  Mobility Response Tolerated well  Mobility performed by Mobility specialist  $Mobility charge 1 Mobility    Pt required Min A to stand from EOB and then used bathroom via catheter before ambulation. Pt ambulated 350 ft in hallway using RW and did not c/o of pain, SOB, or dizziness. Pt returned to recliner following session and was left in room with call bell at side, chair alarm on, and RN in room.   [place] Specialist Acute Rehabilitation Services Phone: [phone] 07/11/21, 2:30 PM

## 2021-07-11 NOTE — Progress Notes (Signed)
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  Patient had PICC line placed on 07/07/21 for planned home infusion.  However family brought him in as they noticed he was more confused and weak with decreased p.o. intake.   Urine culture done at the urology office with Dr. Alyson Ingles on 07/04/2021 grew multidrug-resistant E. coli and he recommended 10 days of meropenem.  Assessment & Plan:   Active Problems:   Chest pain   Complicated UTI (urinary tract infection)   UTI (urinary tract infection)   #1 MDRO E COLI UTI -patient admitted with increased confusion.  Urine culture from 07/04/2021 with multi drug-resistant E. coli on meropenem.  He does not have a Foley catheter but he is mostly bedridden with history of stroke and right-sided weakness and Parkinson's disease. Meropenem for 10  days last day 07/16/21 PT consulted again as patient's family would like him to go to rehab prior to discharge home to be more independent and safe with transfers at home with his wife.  #2 Parkinson's disease continue Sinemet as he was taking at home  #3 history of stroke with right-sided weakness on Plavix  #4 history of essential hypertension-blood pressure 105/70  #5 hyperlipidemia on statin  #6 history of glaucoma  #7 diet-controlled diabetes hemoglobin A1c was 6.4 at that time Will check A1c this admission.   #8 constipation we will put him on MiraLAX and senna  #9 abdominal pain likely secondary to constipation we will order right upper quadrant ultrasound CBC CMP and lipase.  Smog enema ordered.  Estimated body mass index is 23.1 kg/m as calculated from the following:   Height as  of this encounter: '5\' 7"'$  (1.702 m).   Weight as of this encounter: 66.9 kg.  DVT prophylaxis: Lovenox  code Status: Full code  family Communication: Discussed with patient's wife on the phone  disposition Plan:  Status is: Inpatient  Remains inpatient appropriate because:IV treatments appropriate due to intensity of illness or inability to take PO  Dispo: The patient is from: Home              Anticipated d/c is to: Home              Patient currently is not medically stable to d/c.   Difficult to place patient No    Consultants:  Dw urology on phone  Procedures:none Antimicrobials: merepenem  Subjective: Resting in bed  Abdominal pain better after moving bowels   Objective: Vitals:   07/10/21 1508 07/10/21 2144 07/11/21 0520 07/11/21 1443  BP: 109/77 107/72 111/72 105/70  Pulse: 70 64 70 76  Resp: '20 18 16 18  '$ Temp: (!) 97.5 F (36.4 C) 97.7 F (36.5 C) 98.5 F (36.9 C) 98.1 F (36.7 C)  TempSrc: Oral Oral Oral Oral  SpO2: 96% 98% 100% 99%  Weight:      Height:        Intake/Output Summary (Last 24 hours) at 07/11/2021 1540 Last data filed at 07/11/2021 1400 Gross per 24 hour  Intake 2803.88 ml  Output 1701 ml  Net 1102.88 ml    Filed Weights   07/07/21 2156 07/08/21 0330 07/10/21 0508  Weight: 76.7 kg 66.3 kg 66.9 kg    Examination:  General exam: Appears calm and comfortable  Respiratory system: Clear to auscultation. Respiratory effort normal. Cardiovascular system: S1 & S2 heard, RRR. No JVD, murmurs, rubs, gallops or clicks. No pedal edema. Gastrointestinal system: Abdomen is nondistended, soft and tender  No organomegaly or masses felt. Normal bowel sounds heard. Central nervous system: Alert and oriented. Right sided weakness  Extremities: Symmetric 5 x 5 power. Skin: No rashes, lesions or ulcers Psychiatry: Judgement and insight appear normal. Mood & affect appropriate.     Data Reviewed: I have personally reviewed following labs and  imaging studies  CBC: Recent Labs  Lab 07/07/21 2237 07/08/21 0921 07/09/21 0201 07/10/21 1020  WBC 5.7 5.3 6.0 5.4  NEUTROABS 3.2  --   --   --   HGB 12.9* 12.0* 11.8* 12.9*  HCT 41.2 38.6* 37.6* 41.5  MCV 82.2 82.0 81.6 81.4  PLT 218 208 200 [id number]    Basic Metabolic Panel: Recent Labs  Lab 07/07/21 2237 07/08/21 1046 07/09/21 0201 07/10/21 1020  NA 140  --  137 136  K 3.6  --  3.5 3.7  CL 104  --  101 99  CO2 29  --  28 29  GLUCOSE 148*  --  103* 147*  BUN 16  --  15 13  CREATININE 1.15 1.01 1.12 1.21  CALCIUM 9.6  --  9.3 9.7    GFR: Estimated Creatinine Clearance: 46.3 mL/min (by C-G formula based on SCr of 1.21 mg/dL). Liver Function Tests: Recent Labs  Lab 07/07/21 2237 07/10/21 1020  AST 17 15  ALT 7 6  ALKPHOS 67 62  BILITOT 0.9 1.0  PROT 6.9 6.3*  ALBUMIN 4.1 3.6    Recent Labs  Lab 07/10/21 1020  LIPASE 31   No results for input(s): AMMONIA in the last 168 hours. Coagulation Profile: No results for input(s): INR, PROTIME in the last 168 hours. Cardiac Enzymes: No results for input(s): CKTOTAL, CKMB, CKMBINDEX, TROPONINI in the last 168 hours. BNP (last 3 results) No results for input(s): PROBNP in the last 8760 hours. HbA1C: Recent Labs    07/10/21 1020  HGBA1C 6.8*    CBG: Recent Labs  Lab 07/10/21 1201 07/10/21 1648 07/10/21 2153 07/11/21 0818 07/11/21 1148  GLUCAP 115* 136* 151* 117* 168*    Lipid Profile: No results for input(s): CHOL, HDL, LDLCALC, TRIG, CHOLHDL, LDLDIRECT in the last 72 hours.  Thyroid Function Tests: No results for input(s): TSH, T4TOTAL, FREET4, T3FREE, THYROIDAB in the last 72 hours. Anemia Panel: No results for input(s): VITAMINB12, FOLATE, FERRITIN, TIBC, IRON, RETICCTPCT in the last 72 hours. Sepsis Labs: Recent Labs  Lab 07/08/21 1354 07/09/21 0201 07/10/21 0412  PROCALCITON <0.10 <0.10 <0.10     Recent Results (from the past 240 hour(s))  Microscopic Examination     Status: Abnormal    Collection Time: 07/04/21 11:27 AM   Urine  Result Value Ref Range Status   WBC, UA >30 (A) 0 - 5 /hpf Final   RBC 3-10 (A) 0 - 2 /hpf Final   Epithelial Cells (non renal) 0-10 0 - 10 /hpf Final   Renal Epithel, UA None seen None seen /hpf Final   Mucus, UA Present Not Estab. Final   Bacteria, UA Many (A) None seen/Few Final  Urine Culture     Status: Abnormal   Collection Time: 07/04/21 11:35 AM   Specimen: Urine   UR  Result Value Ref Range Status   Urine Culture, Routine Final report (A)  Final  Organism ID, Bacteria Comment (A)  Final    Comment: Escherichia coli, identified by an automated biochemical system. 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 Greater than 100,000 colony forming units per mL    ORGANISM ID, BACTERIA Not applicable  Final   Antimicrobial Susceptibility Comment  Final    Comment:       ** S = Susceptible; I = Intermediate; R = Resistant **                    P = Positive; N = Negative             MICS are expressed in micrograms per mL    Antibiotic                 RSLT#1    RSLT#2    RSLT#3    RSLT#4 Amoxicillin/Clavulanic Acid    I Ampicillin                     R Cefazolin                      R Cefepime                       R Ceftriaxone                    R Cefuroxime                     R Ciprofloxacin                  R Ertapenem                      S Gentamicin                     S Imipenem                       S Levofloxacin                   R Meropenem                      S Nitrofurantoin                 I Piperacillin/Tazobactam        S Tetracycline                   R Tobramycin                     R Trimethoprim/Sulfa             R   Urine Culture     Status: Abnormal   Collection Time: 07/08/21  1:29 AM   Specimen: Urine, Clean Catch  Result Value Ref Range Status   Specimen Description   Final    URINE, CLEAN CATCH Performed at [facility], [place]  [address], [place], [place] [address]    Special Requests   Final    NONE Performed at [facility], [place] [address] [place],  [address]    Culture (A)  Final    >=100,000 COLONIES/mL ESCHERICHIA COLI Confirmed Extended Spectrum Beta-Lactamase Producer (ESBL).  In bloodstream infections from ESBL organisms, carbapenems are preferred over piperacillin/tazobactam. They  are shown to have a lower risk of mortality.    Report Status 07/11/2021 FINAL  Final   Organism ID, Bacteria ESCHERICHIA COLI (A)  Final      Susceptibility   Escherichia coli - MIC*    AMPICILLIN >=32 RESISTANT Resistant     CEFAZOLIN >=64 RESISTANT Resistant     CEFEPIME 16 RESISTANT Resistant     CEFTRIAXONE >=64 RESISTANT Resistant     CIPROFLOXACIN >=4 RESISTANT Resistant     GENTAMICIN <=1 SENSITIVE Sensitive     IMIPENEM <=0.25 SENSITIVE Sensitive     NITROFURANTOIN 64 INTERMEDIATE Intermediate     TRIMETH/SULFA >=320 RESISTANT Resistant     AMPICILLIN/SULBACTAM >=32 RESISTANT Resistant     PIP/TAZO <=4 SENSITIVE Sensitive     * >=100,000 COLONIES/mL ESCHERICHIA COLI  SARS CORONAVIRUS 2 (TAT 6-24 HRS) Nasopharyngeal Nasopharyngeal Swab     Status: None   Collection Time: 07/08/21  2:02 AM   Specimen: Nasopharyngeal Swab  Result Value Ref Range Status   SARS Coronavirus 2 NEGATIVE NEGATIVE Final    Comment: (NOTE) SARS-CoV-2 target nucleic acids are NOT DETECTED.  The SARS-CoV-2 RNA is generally detectable in upper and lower respiratory specimens during the acute phase of infection. Negative results do not preclude SARS-CoV-2 infection, do not rule out co-infections with other pathogens, and should not be used as the sole basis for treatment or other patient management decisions. Negative results must be combined with clinical observations, patient history, and epidemiological information. The expected result is Negative.  Fact Sheet for  Patients: SugarRoll.be  Fact Sheet for Healthcare Providers: https://www.woods-.com/  This test is not yet approved or cleared by the [place] FDA and  has been authorized for detection and/or diagnosis of SARS-CoV-2 by FDA under an Emergency Use Authorization (EUA). This EUA will remain  in effect (meaning this test can be used) for the duration of the COVID-19 declaration under Se ction 564(b)(1) of the Act, 21 U.S.C. section 360bbb-3(b)(1), unless the authorization is terminated or revoked sooner.  Performed at [facility], [place] [address], [place], [place] [address]   Culture, blood (routine x 2)     Status: None (Preliminary result)   Collection Time: 07/08/21  2:20 PM   Specimen: Left Antecubital; Blood  Result Value Ref Range Status   Specimen Description   Final    LEFT ANTECUBITAL Performed at [facility] [address], [place], [place] [address]    Special Requests   Final    BOTTLES DRAWN AEROBIC AND ANAEROBIC Blood Culture adequate volume Performed at [facility] [address], [place], [place] [address]    Culture   Final    NO GROWTH 3 DAYS Performed at [facility], [place] [address] [place], [place] [address]    Report Status PENDING  Incomplete  Culture, blood (routine x 2)     Status: None (Preliminary result)   Collection Time: 07/08/21  2:29 PM   Specimen: BLOOD LEFT HAND  Result Value Ref Range Status   Specimen Description   Final    BLOOD LEFT HAND Performed at [facility] [address], [place], [place] [address]    Special Requests   Final    BOTTLES DRAWN AEROBIC ONLY Blood Culture results may not be optimal due to an inadequate volume of blood received in culture bottles Performed at [facility] [address], [place], [place] [address]    Culture   Final  NO GROWTH 3 DAYS Performed at [facility], [place] [address], [place], [place] [address]    Report Status PENDING  Incomplete          Radiology Studies: DG Abd 1 View  Result Date: 07/09/2021 CLINICAL DATA:  Abdominal distension EXAM: ABDOMEN - 1 VIEW COMPARISON:  None. FINDINGS: No free air, portal venous gas, or pneumatosis. Mild fecal loading in the descending colon and rectum. No bowel obstruction. No renal or ureteral stones noted. No other acute abnormalities. IMPRESSION: No cause for bowel distension identified.  No bowel obstruction. Electronically Signed   By: Dorise Bullion III M.D.   On: 07/09/2021 17:36   US Abdomen Limited RUQ (LIVER/GB)  Result Date: 07/10/2021 CLINICAL DATA:  Abdominal pain. EXAM: ULTRASOUND ABDOMEN LIMITED RIGHT UPPER QUADRANT COMPARISON:  None. FINDINGS: Gallbladder: No gallstones or wall thickening visualized. No sonographic Murphy sign noted by sonographer. Common bile duct: Diameter: 6 mm Liver: No focal lesion identified. Within normal limits in parenchymal echogenicity. Portal vein is patent on color Doppler imaging with normal direction of blood flow towards the liver. Other: None. IMPRESSION: No cause for the patient's abdominal pain identified. Electronically Signed   By: Dorise Bullion III M.D.   On: 07/10/2021 15:19        Scheduled Meds:  atorvastatin  40 mg Oral QPM   Carbidopa-Levodopa ER  3 capsule Oral TID WC & HS   Chlorhexidine Gluconate Cloth  6 each Topical Daily   clopidogrel  75 mg Oral Daily   enoxaparin (LOVENOX) injection  40 mg Subcutaneous Daily   Istradefylline  40 mg Oral Daily   pantoprazole  40 mg Oral Daily   rivastigmine  4.6 mg Transdermal Daily   Continuous Infusions:  sodium chloride 75 mL/hr at 07/11/21 1043   meropenem (MERREM) IV 1 g (07/11/21 1403)     LOS: 3 days    Time spent: 7 min    Georgette Shell, MD 07/11/2021, 3:40 PM

## 2021-07-12 LAB — COMPREHENSIVE METABOLIC PANEL
ALT: 8 U/L (ref 0–44)
AST: 35 U/L (ref 15–41)
Albumin: 3.4 g/dL — ABNORMAL LOW (ref 3.5–5.0)
Alkaline Phosphatase: 61 U/L (ref 38–126)
Anion gap: 5 (ref 5–15)
BUN: 13 mg/dL (ref 8–23)
CO2: 27 mmol/L (ref 22–32)
Calcium: 9.3 mg/dL (ref 8.9–10.3)
Chloride: 104 mmol/L (ref 98–111)
Creatinine, Ser: 0.99 mg/dL (ref 0.61–1.24)
GFR, Estimated: >60 mL/min (ref >60)
Glucose, Bld: 163 mg/dL — ABNORMAL HIGH (ref 70–99)
Potassium: 4.1 mmol/L (ref 3.5–5.1)
Sodium: 136 mmol/L (ref 135–145)
Total Bilirubin: 0.8 mg/dL (ref 0.3–1.2)
Total Protein: 6.1 g/dL — ABNORMAL LOW (ref 6.5–8.1)

## 2021-07-12 LAB — CBC
HCT: 40.5 % (ref 39.0–52.0)
Hemoglobin: 12.7 g/dL — ABNORMAL LOW (ref 13.0–17.0)
MCH: 26 pg (ref 26.0–34.0)
MCHC: 31.4 g/dL (ref 30.0–36.0)
MCV: 83 fL (ref 80.0–100.0)
Platelets: 194 10*3/uL (ref 150–400)
RBC: 4.88 MIL/uL (ref 4.22–5.81)
RDW: 14.5 % (ref 11.5–15.5)
WBC: 5.7 10*3/uL (ref 4.0–10.5)
nRBC: 0 % (ref 0.0–0.2)

## 2021-07-12 LAB — GLUCOSE, CAPILLARY
Glucose-Capillary: 95 mg/dL (ref 70–99)
Glucose-Capillary: 171 mg/dL — ABNORMAL HIGH (ref 70–99)
Glucose-Capillary: 108 mg/dL — ABNORMAL HIGH (ref 70–99)

## 2021-07-12 MED ORDER — SODIUM CHLORIDE 0.9 % IV SOLN
1.0000 g | Freq: Once | INTRAVENOUS | Status: AC
  Administered 2021-07-12: 1000 mg via INTRAVENOUS
  Filled 2021-07-12: qty 1

## 2021-07-12 MED ORDER — HEPARIN SOD (PORK) LOCK FLUSH 100 UNIT/ML IV SOLN
250.0000 [IU] | INTRAVENOUS | Status: AC | PRN
  Administered 2021-07-12: 250 [IU]
  Filled 2021-07-12: qty 2.5

## 2021-07-12 MED ORDER — ERTAPENEM IV (FOR PTA / DISCHARGE USE ONLY): 1.0000 g | INTRAVENOUS | 0 refills | Status: AC

## 2021-07-12 NOTE — Plan of Care (Signed)

## 2021-07-12 NOTE — Progress Notes (Signed)
Mobility Specialist - Progress Note    07/12/21 1637  Mobility  Activity Ambulated in hall  Level of Assistance Contact guard assist, steadying assist  Joanna wheel walker  Distance Ambulated (ft) 350 ft  Mobility Ambulated with assistance in hallway  Mobility Response Tolerated well  Mobility performed by Mobility specialist  $Mobility charge 1 Mobility    Pt ambulated 350 ft in hallway using RW. No report of pain, dizziness, or SOB were presented. Pt returned to recliner after session and was left in room with chair alarm on and family in room.    [place] Specialist Acute Rehabilitation Services Phone: [phone] 07/12/21, 4:39 PM

## 2021-07-12 NOTE — TOC Transition Note (Signed)
Transition of Care [facility]) - CM/SW Discharge Note  Patient Details  Name: Cye Einstein MRN: [id number] Date of Birth: 1941-05-25  Transition of Care [facility]) CM/SW Contact:  Sherie Don, LCSW Phone Number: 07/12/2021, 4:14 PM  Clinical Narrative: [facility] consulted for discharging the patient home with IV antibiotics as patient is medically stable for discharge home. CSW explained discharging home with IV antibiotics and HH to patient, his wife, and stepdaughter. CSW made IV antibiotics referral to [facility] with [employer]. Pam to set up [facility] with Nanine Means as this agency covers [place].  Due to patient having [employer] as part of his insurance, it will not cover a HHRN but Nanine Means is agreeable to pulling the PICC line. [facility] to provide PT and OT. CSW updated by Pam that IV antibiotics have been approved. Pam to teach patient how to do the IV antibiotics at home. TOC signing off.  Final next level of care: [place] Barriers to Discharge: Barriers Resolved  Patient Goals and CMS Choice Patient states their goals for this hospitalization and ongoing recovery are:: Discharge home with [facility] CMS Medicare.gov Compare Post Acute Care list provided to:: Patient Choice offered to / list presented to : Patient  Discharge Plan and Services          DME Arranged: N/A DME Agency: NA HH Arranged: PT, OT, RN [place] Agency: [place] Date [place]: 07/12/21 Representative spoke with at [place]: Levada Dy  Readmission Risk Interventions No flowsheet data found.

## 2021-07-12 NOTE — Progress Notes (Signed)
Provided discharge education/instructions to Pt and wife, all questions and concerns addressed, Pt not in acute distress, IV team capped PICC, meds stored in pharmacy returned to Pt's wife. Pt discharged with belongings accompanied by wife.

## 2021-07-12 NOTE — Progress Notes (Signed)
Physical Therapy Treatment Patient Details Name: Mike Nixon MRN: [id number] DOB: May 07, 1941 Today's Date: 07/12/2021    History of Present Illness patient is a 80 year old male who presented to ED with recurrent UTI. PMH: CVA with R sided weakness,parkinsons disease, HTN,glaucoma, UTI    PT Comments    Pt with significant improvement in ambulation tolerance this session, requiring min guard to power to stand with RW and ambulates 360 ft with RW. Pt maintains narrow BOS, equal bil step length and foot clearance, no LOB and able to continue conversation. Pt assisted back to chair with all needs in reach and lab in room to draw blood. No family present, pt easier to understand this session, occasional mumbled speech but able to state no and yes clearly to make needs known.    Follow Up Recommendations  Home health PT;Supervision - Intermittent     Equipment Recommendations  None recommended by PT    Recommendations for Other Services       Precautions / Restrictions Precautions Precautions: Fall Precaution Comments: R sided weakness h/o CVA, PICC line RUE Restrictions Weight Bearing Restrictions: No    Mobility  Bed Mobility Overal bed mobility: Needs Assistance Bed Mobility: Supine to Sit  Supine to sit: Min guard  General bed mobility comments: slow to mobilize to EOB but able to slide LE over to EOB and upright trunk with light use of bedrail and no pysical assist    Transfers Overall transfer level: Needs assistance Equipment used: Rolling walker (2 wheeled) Transfers: Sit to/from Stand Sit to Stand: Min guard  General transfer comment: min guard to steady with powering up, BLE braced against front of bed, correct hand placement to power up and VC to return to chair before sitting  Ambulation/Gait Ambulation/Gait assistance: Min guard Gait Distance (Feet): 360 Feet Assistive device: Rolling walker (2 wheeled) Gait Pattern/deviations: Step-to pattern;Narrow base of  support Gait velocity: decreased   General Gait Details: pt maintains narrow BOS, equal bil step length and foot clearance, good steadiness without LOB, able to continue conversation without SOB, clears past obstacles in hallway without difficulty   Stairs             Wheelchair Mobility    Modified Rankin (Stroke Patients Only)       Balance Overall balance assessment: Needs assistance Sitting-balance support: Feet supported Sitting balance-Leahy Scale: Good  Standing balance support: During functional activity Standing balance-Leahy Scale: Fair Standing balance comment: able to release UEs to don face mask, dynamic using RW       Cognition Arousal/Alertness: Awake/alert Behavior During Therapy: WFL for tasks assessed/performed Overall Cognitive Status: No family/caregiver present to determine baseline cognitive functioning  General Comments: Pt conversational but mumbles with difficulty understanding at times, able to make needs known stating yes and no to questions, pt smiling, asking if Mike Nixon? is visiting him today      Exercises      General Comments        Pertinent Vitals/Pain Pain Assessment: Faces Faces Pain Scale: No hurt    Home Living                      Prior Function            PT Goals (current goals can now be found in the care plan section) Acute Rehab PT Goals Patient Stated Goal: get in chair PT Goal Formulation: With patient Time For Goal Achievement: 07/23/21 Potential to Achieve Goals: Good Progress towards  PT goals: Progressing toward goals    Frequency    Min 2X/week      PT Plan Current plan remains appropriate;Discharge plan needs to be updated    Co-evaluation              AM-PAC PT "6 Clicks" Mobility   Outcome Measure  Help needed turning from your back to your side while in a flat bed without using bedrails?: A Little Help needed moving from lying on your back to sitting on the side of a  flat bed without using bedrails?: A Little Help needed moving to and from a bed to a chair (including a wheelchair)?: A Little Help needed standing up from a chair using your arms (e.g., wheelchair or bedside chair)?: A Little Help needed to walk in hospital room?: A Little Help needed climbing 3-5 steps with a railing? : A Little 6 Click Score: 18    End of Session Equipment Utilized During Treatment: Gait belt Activity Tolerance: Patient tolerated treatment well Patient left: in chair;with call bell/phone within reach;with chair alarm set Nurse Communication: Mobility status PT Visit Diagnosis: Unsteadiness on feet (R26.81);Other abnormalities of gait and mobility (R26.89);Muscle weakness (generalized) (M62.81);Other symptoms and signs involving the nervous system [id number])     Time: [id number] PT Time Calculation (min) (ACUTE ONLY): 19 min  Charges:  $Gait Training: 8-22 mins                      Mike Nixon PT, DPT 07/12/21, 11:13 AM

## 2021-07-12 NOTE — Discharge Summary (Addendum)
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  Patient had PICC line placed on 07/07/21 for planned home infusion.  However family brought him in as they noticed he was more confused and weak with decreased p.o. intake.   Urine culture done at the urology office with Dr. Alyson Ingles on 07/04/2021 grew multidrug-resistant E. coli and he recommended 10 days of meropenem.   Discharge Diagnoses:  Active Problems:   Chest pain   Complicated UTI (urinary tract infection)   UTI (urinary tract infection)   Abdominal distension      #1 MDRO E COLI UTI -patient was admitted with increased confusion.  Urine culture from 07/04/2021 with multi drug-resistant E. coli on meropenem.  He has history of stroke with residual right-sided weakness and Parkinson's disease.  However he ambulates with help.  He was treated with imipenem.  He was discharged on meropenem to complete a course of 10 days.  Patient has a PICC line that was placed prior to admission to the hospital.  Home health has been arranged so they could teach the wife how to administer IV antibiotics.  PT was consulted and recommended home health PT.   DC  PICC line upon completion of antibiotic therapy.   #2 Parkinson's disease continue Sinemet    #3 history of stroke with right-sided weakness  on Plavix   #4 history of essential hypertension-blood pressure 105/70   #5 hyperlipidemia on statin   #6 history of glaucoma stable stable   #7 diet-controlled diabetes hemoglobin A1c was 6.4 at that time  #8 constipation resolved.  Continue home stool softeners.    #9 abdominal pain secondary to constipation.  An abdominal ultrasound and KUB did not reveal any acute findings for his abdominal pain however smog enema was given with good bowel movements and his pain was relieved.    Estimated body mass index is 23.1 kg/m as calculated from the following:   Height as of this encounter: 5' 7"  (1.702 m).   Weight as of this encounter: 66.9 kg.  Discharge Instructions  Discharge Instructions     [facility] pharmacist to adjust dose for Vancomycin, Aminoglycosides and other anti-infective therapies as requested by physician.   Complete by: As directed    [facility] to provide Cath Flo 16m   Complete by: As directed    Administer for PICC line occlusion and as ordered by physician for other access device issues.   Anaphylaxis Kit: Provided to treat any anaphylactic reaction to the medication being provided to the patient if First Dose or when requested by physician   Complete by: As directed    Epinephrine 14mml vial / amp: Administer 0.65m62m0.65ml57mubcutaneously once for moderate to severe anaphylaxis, nurse to call physician and pharmacy when reaction occurs and call 911 if needed for immediate care   Diphenhydramine 50mg72mIV vial: Administer 25-50mg 865mM PRN for first dose reaction, rash, itching, mild reaction, nurse to call physician and pharmacy  when reaction occurs   Sodium Chloride 0.9% NS 537m IV: Administer if needed for hypovolemic blood pressure drop or as ordered by physician after call to physician with anaphylactic reaction   Change dressing on IV access line weekly and PRN   Complete by: As directed    Diet - low sodium heart healthy    Complete by: As directed    Face-to-face encounter (required for Medicare/Medicaid patients)   Complete by: As directed    IWinfallcertify that this patient is under my care and that I, or a nurse practitioner or physician's assistant working with me, had a face-to-face encounter that meets the physician face-to-face encounter requirements with this patient on 07/12/2021. The encounter with the patient was in whole, or in part for the following medical condition(s) which is the primary reason for home health care (List medical condition): UTI CVA   The encounter with the patient was in whole, or in part, for the following medical condition, which is the primary reason for home health care: UTI CVA   I certify that, based on my findings, the following services are medically necessary home health services:  Nursing Physical therapy     Reason for Medically Necessary Home Health Services: Therapy- GPersonnel officer TPublic librarian  My clinical findings support the need for the above services: Shortness of breath with activity   Further, I certify that my clinical findings support that this patient is homebound due to: Unable to leave home safely without assistance   Flush IV access with Sodium Chloride 0.9% and Heparin 10 units/ml or 100 units/ml   Complete by: As directed    Home Health   Complete by: As directed    To provide the following care/treatments:  PT OT RN     Home infusion instructions - [facility]   Complete by: As directed    Instructions: Flush IV access with Sodium Chloride 0.9% and Heparin 10units/ml or 100units/ml   Change dressing on IV access line: Weekly and PRN   Instructions Cath Flo 217m Administer for PICC Line occlusion and as ordered by physician for other access device   [facility] pharmacist to adjust dose for: Vancomycin, Aminoglycosides and other anti-infective therapies as requested by physician   Increase  activity slowly   Complete by: As directed    Method of administration may be changed at the discretion of home infusion pharmacist based upon assessment of the patient and/or caregiver's ability to self-administer the medication ordered   Complete by: As directed    No wound care   Complete by: As directed    Outpatient Parenteral Antibiotic Therapy Information Antibiotic: Ertapenem (Invanz) IVPB; Indications for use: ESBL E coli UTI; End Date: 07/17/2021   Complete by: As directed    Antibiotic: Ertapenem (Invanz) IVPB   Indications for use: ESBL E coli UTI   End Date: 07/17/2021      Allergies as of 07/12/2021       Reactions   Benazepril Cough   Doxycycline Other (See Comments)   Unknown   Levofloxacin Hives, Swelling   Simvastatin Other (See Comments)   Pt does not remember what the reaction was. ?muscle aches   Tamsulosin Hives, Swelling   Tramadol Hcl Hives, Swelling   Sulfa Antibiotics Hives, Swelling, Rash, Other (See Comments)   Severe rash        Medication List     STOP taking these medications    chlorhexidine  0.12 % solution Commonly known as: PERIDEX   ertapenem 1,000 mg in sodium chloride 0.9 % 100 mL   HYDROcodone-acetaminophen 5-325 MG tablet Commonly known as: NORCO/VICODIN   nitrofurantoin 50 MG capsule Commonly known as: MACRODANTIN   sodium phosphate 7-19 GM/118ML Enem   Toviaz 8 MG Tb24 tablet Generic drug: fesoterodine       TAKE these medications    acetaminophen 500 MG tablet Commonly known as: TYLENOL Take 1,000 mg by mouth every 6 (six) hours as needed for mild pain or headache.   atorvastatin 40 MG tablet Commonly known as: LIPITOR Take 40 mg by mouth every evening.   Carbidopa-Levodopa ER 36.25-145 MG Cpcr Take 3 capsules by mouth 4 (four) times daily. What changed: Another medication with the same name was removed. Continue taking this medication, and follow the directions you see here.   clopidogrel 75 MG  tablet Commonly known as: PLAVIX Take 1 tablet (75 mg total) by mouth daily.   ertapenem  IVPB Commonly known as: INVANZ Inject 1 g into the vein daily for 5 days. Indication:  ESBL E coli UTI First Dose: No Last Day of Therapy:  07/17/21 Labs - Once weekly:  CBC/D and BMP, Labs - Every other week:  ESR and CRP Method of administration: Mini-Bag Plus / Gravity Method of administration may be changed at the discretion of home infusion pharmacist based upon assessment of the patient and/or caregiver's ability to self-administer the medication ordered. Start taking on: July 13, 2021   furosemide 20 MG tablet Commonly known as: LASIX Take 1 tablet (20 mg total) by mouth as needed. What changed:  when to take this reasons to take this Notes to patient: Resume home regimen   hydrochlorothiazide 25 MG tablet Commonly known as: HYDRODIURIL Take 25 mg by mouth daily. Notes to patient: Resume home regimen   meclizine 25 MG tablet Commonly known as: ANTIVERT Take 25 mg by mouth 3 (three) times daily as needed for dizziness. Notes to patient: Resume home regimen   metFORMIN 500 MG 24 hr tablet Commonly known as: GLUCOPHAGE-XR Take 500 mg by mouth daily. Notes to patient: Resume home regimen   Nourianz 40 MG Tabs Generic drug: Istradefylline Take 1 tablet by mouth daily. What changed: Another medication with the same name was removed. Continue taking this medication, and follow the directions you see here.   Nuedexta 20-10 MG capsule Generic drug: Dextromethorphan-quiNIDine Take 1 capsule by mouth 2 (two) times daily. Notes to patient: Resume home regimen   pantoprazole 40 MG tablet Commonly known as: PROTONIX Take 1 tablet by mouth daily.   polyethylene glycol powder 17 GM/SCOOP powder Commonly known as: GLYCOLAX/MIRALAX Take 17 g by mouth daily.   rivastigmine 4.6 mg/24hr Commonly known as: EXELON Place 4.6 mg onto the skin daily.   Travoprost (BAK Free) 0.004 % Soln  ophthalmic solution Commonly known as: TRAVATAN Place 1 drop into both eyes at bedtime. Notes to patient: Resume home regimen               Discharge Care Instructions  (From admission, onward)           Start     Ordered   07/12/21 0000  Change dressing on IV access line weekly and PRN  (Home infusion instructions - [facility] )        07/12/21 1048            Follow-up Information     Sasser, Silvestre Moment, MD Follow up.  Specialty: Family Medicine Contact information: [place] [address] [phone]         Arnoldo Lenis, MD .   Specialty: Cardiology Contact information: [place] [address] [phone]                Allergies  Allergen Reactions   Benazepril Cough   Doxycycline Other (See Comments)    Unknown   Levofloxacin Hives and Swelling   Simvastatin Other (See Comments)    Pt does not remember what the reaction was. ?muscle aches   Tamsulosin Hives and Swelling   Tramadol Hcl Hives and Swelling   Sulfa Antibiotics Hives, Swelling, Rash and Other (See Comments)    Severe rash    Consultations: [facility] u[facility] on phone    Procedures/Studies: DG Abd 1 View  Result Date: 07/09/2021 CLINICAL DATA:  Abdominal distension EXAM: ABDOMEN - 1 VIEW COMPARISON:  None. FINDINGS: No free air, portal venous gas, or pneumatosis. Mild fecal loading in the descending colon and rectum. No bowel obstruction. No renal or ureteral stones noted. No other acute abnormalities. IMPRESSION: No cause for bowel distension identified.  No bowel obstruction. Electronically Signed   By: Dorise Bullion III M.D.   On: 07/09/2021 17:36   DG CHEST PORT 1 VIEW  Result Date: 07/08/2021 CLINICAL DATA:  Chest pain EXAM: PORTABLE CHEST 1 VIEW COMPARISON:  05/09/2020 FINDINGS: Interval placement of a right-sided PICC line with distal tip terminating at the superior cavoatrial junction. Heart size is within normal limits.  No focal airspace consolidation, pleural effusion, or pneumothorax. IMPRESSION: Satisfactory positioning of right-sided PICC line. No acute cardiopulmonary findings. Electronically Signed   By: Davina Poke D.O.   On: 07/08/2021 14:38   IR PICC PLACEMENT RIGHT >5 YRS INC IMG GUIDE  Result Date: 07/07/2021 INDICATION: Patient with history of chronic cystitis with hematuria; central venous access requested for antibiotic therapy EXAM: RIGHT UPPER EXTREMITY ULTRASOUND AND FLUOROSCOPIC GUIDED PICC LINE INSERTION MEDICATIONS: 1% lidocaine to skin and subcutaneous tissue ANESTHESIA/SEDATION: None FLUOROSCOPY TIME:  Fluoroscopy Time:  36 seconds (4 mGy). COMPLICATIONS: None immediate. TECHNIQUE: The procedure, risks, benefits, and alternatives were explained to the patient and informed written consent was obtained. A timeout was performed prior to the initiation of the procedure. The right upper extremity was prepped with chlorhexidine in a sterile fashion, and a sterile drape was applied covering the operative field. Maximum barrier sterile technique with sterile gowns and gloves were used for the procedure. A timeout was performed prior to the initiation of the procedure. Local anesthesia was provided with 1% lidocaine. Under direct ultrasound guidance, the right brachial vein was accessed with a micropuncture kit after the overlying soft tissues were anesthetized with 1% lidocaine. An ultrasound image was saved for documentation purposes. A guidewire was advanced to the level of the superior caval-atrial junction for measurement purposes and the PICC line was cut to length. A peel-away sheath was placed and a 44 cm, 5 [place], single lumen was inserted to level of the superior caval-atrial junction. A post procedure spot fluoroscopic was obtained. The catheter easily aspirated and flushed and was sutured in place. A dressing was placed. The patient tolerated the procedure well without immediate post procedural  complication. FINDINGS: After catheter placement, the tip lies within the superior cavoatrial junction the catheter aspirates and flushes normally and is ready for immediate use. IMPRESSION: Successful ultrasound and fluoroscopic guided placement of a right brachial vein approach, 44 cm, 5 [place],  single lumen PICC with tip at the superior caval-atrial junction. The PICC line is ready for immediate use. Read by: Rowe Robert, PA-C Electronically Signed   By: Albin Felling MD   On: 07/07/2021 09:42   US Abdomen Limited RUQ (LIVER/GB)  Result Date: 07/10/2021 CLINICAL DATA:  Abdominal pain. EXAM: ULTRASOUND ABDOMEN LIMITED RIGHT UPPER QUADRANT COMPARISON:  None. FINDINGS: Gallbladder: No gallstones or wall thickening visualized. No sonographic Murphy sign noted by sonographer. Common bile duct: Diameter: 6 mm Liver: No focal lesion identified. Within normal limits in parenchymal echogenicity. Portal vein is patent on color Doppler imaging with normal direction of blood flow towards the liver. Other: None. IMPRESSION: No cause for the patient's abdominal pain identified. Electronically Signed   By: Dorise Bullion III M.D.   On: 07/10/2021 15:19   (Echo, Carotid, EGD, Colonoscopy, ERCP)    Subjective: Patient is resting in bed in no acute distress no events overnight  Discharge Exam: Vitals:   07/12/21 0948 07/12/21 1405  BP: (!) 133/91 127/81  Pulse: 74 74  Resp: 15 18  Temp: 97.6 F (36.4 C) 98.5 F (36.9 C)  SpO2: 98% 98%   Vitals:   07/11/21 2144 07/12/21 0621 07/12/21 0948 07/12/21 1405  BP: 121/77 120/71 (!) 133/91 127/81  Pulse: 67 66 74 74  Resp: 18 18 15 18   Temp: 97.6 F (36.4 C) 98.4 F (36.9 C) 97.6 F (36.4 C) 98.5 F (36.9 C)  TempSrc: Oral Oral Oral Oral  SpO2: 98% 97% 98% 98%  Weight:      Height:        General: Pt is alert, awake, not in acute distress Cardiovascular: RRR, S1/S2 +, no rubs, no gallops Respiratory: CTA bilaterally, no wheezing, no  rhonchi Abdominal: Soft, NT, ND, bowel sounds + Extremities: no edema, no cyanosis    The results of significant diagnostics from this hospitalization (including imaging, microbiology, ancillary and laboratory) are listed below for reference.     Microbiology: Recent Results (from the past 240 hour(s))  Microscopic Examination     Status: Abnormal   Collection Time: 07/04/21 11:27 AM   Urine  Result Value Ref Range Status   WBC, UA >30 (A) 0 - 5 /hpf Final   RBC 3-10 (A) 0 - 2 /hpf Final   Epithelial Cells (non renal) 0-10 0 - 10 /hpf Final   Renal Epithel, UA None seen None seen /hpf Final   Mucus, UA Present Not Estab. Final   Bacteria, UA Many (A) None seen/Few Final  Urine Culture     Status: Abnormal   Collection Time: 07/04/21 11:35 AM   Specimen: Urine   UR  Result Value Ref Range Status   Urine Culture, Routine Final report (A)  Final   Organism ID, Bacteria Comment (A)  Final    Comment: Escherichia coli, identified by an automated biochemical system. 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 Greater than 100,000 colony forming units per mL    ORGANISM ID, BACTERIA Not applicable  Final   Antimicrobial Susceptibility Comment  Final    Comment:       ** S = Susceptible; I = Intermediate; R = Resistant **                    P = Positive; N = Negative             MICS are expressed in micrograms per mL    Antibiotic  RSLT#1    RSLT#2    RSLT#3    RSLT#4 Amoxicillin/Clavulanic Acid    I Ampicillin                     R Cefazolin                      R Cefepime                       R Ceftriaxone                    R Cefuroxime                     R Ciprofloxacin                  R Ertapenem                      S Gentamicin                     S Imipenem                       S Levofloxacin                   R Meropenem                      S Nitrofurantoin                 I Piperacillin/Tazobactam         S Tetracycline                   R Tobramycin                     R Trimethoprim/Sulfa             R   Urine Culture     Status: Abnormal   Collection Time: 07/08/21  1:29 AM   Specimen: Urine, Clean Catch  Result Value Ref Range Status   Specimen Description   Final    URINE, CLEAN CATCH Performed at [facility], [place] [address], [place], [place] [address]    Special Requests   Final    NONE Performed at [facility], [place] [address], [place],  [address]    Culture (A)  Final    >=100,000 COLONIES/mL ESCHERICHIA COLI Confirmed Extended Spectrum Beta-Lactamase Producer (ESBL).  In bloodstream infections from ESBL organisms, carbapenems are preferred over piperacillin/tazobactam. They are shown to have a lower risk of mortality.    Report Status 07/11/2021 FINAL  Final   Organism ID, Bacteria ESCHERICHIA COLI (A)  Final      Susceptibility   Escherichia coli - MIC*    AMPICILLIN >=32 RESISTANT Resistant     CEFAZOLIN >=64 RESISTANT Resistant     CEFEPIME 16 RESISTANT Resistant     CEFTRIAXONE >=64 RESISTANT Resistant     CIPROFLOXACIN >=4 RESISTANT Resistant     GENTAMICIN <=1 SENSITIVE Sensitive     IMIPENEM <=0.25 SENSITIVE Sensitive     NITROFURANTOIN 64 INTERMEDIATE Intermediate     TRIMETH/SULFA >=320 RESISTANT Resistant     AMPICILLIN/SULBACTAM >=32 RESISTANT Resistant     PIP/TAZO <=4 SENSITIVE Sensitive     * >=100,000 COLONIES/mL ESCHERICHIA COLI  SARS CORONAVIRUS 2 (  TAT 6-24 HRS) Nasopharyngeal Nasopharyngeal Swab     Status: None   Collection Time: 07/08/21  2:02 AM   Specimen: Nasopharyngeal Swab  Result Value Ref Range Status   SARS Coronavirus 2 NEGATIVE NEGATIVE Final    Comment: (NOTE) SARS-CoV-2 target nucleic acids are NOT DETECTED.  The SARS-CoV-2 RNA is generally detectable in upper and lower respiratory specimens during the acute phase of infection. Negative results do not preclude SARS-CoV-2  infection, do not rule out co-infections with other pathogens, and should not be used as the sole basis for treatment or other patient management decisions. Negative results must be combined with clinical observations, patient history, and epidemiological information. The expected result is Negative.  Fact Sheet for Patients: SugarRoll.be  Fact Sheet for Healthcare Providers: https://www.woods-.com/  This test is not yet approved or cleared by the [place] FDA and  has been authorized for detection and/or diagnosis of SARS-CoV-2 by FDA under an Emergency Use Authorization (EUA). This EUA will remain  in effect (meaning this test can be used) for the duration of the COVID-19 declaration under Se ction 564(b)(1) of the Act, 21 U.S.C. section 360bbb-3(b)(1), unless the authorization is terminated or revoked sooner.  Performed at [facility], [place] [address], [place], [place] [address]   Culture, blood (routine x 2)     Status: None (Preliminary result)   Collection Time: 07/08/21  2:20 PM   Specimen: Left Antecubital; Blood  Result Value Ref Range Status   Specimen Description   Final    LEFT ANTECUBITAL Performed at [place] [address], [place], [place] [address]    Special Requests   Final    BOTTLES DRAWN AEROBIC AND ANAEROBIC Blood Culture adequate volume Performed at [place] [address] [place], [place] [address]    Culture   Final    NO GROWTH 4 DAYS Performed at [facility], [place] [address], [place], [place] [address]    Report Status PENDING  Incomplete  Culture, blood (routine x 2)     Status: None (Preliminary result)   Collection Time: 07/08/21  2:29 PM   Specimen: BLOOD LEFT HAND  Result Value Ref Range Status   Specimen Description   Final    BLOOD LEFT HAND Performed at [place] [address] [place], [place]  [address]    Special Requests   Final    BOTTLES DRAWN AEROBIC ONLY Blood Culture results may not be optimal due to an inadequate volume of blood received in culture bottles Performed at [place] [address] [place], [place] [address]    Culture   Final    NO GROWTH 4 DAYS Performed at [facility], [place] [address] [place], [place] [address]    Report Status PENDING  Incomplete     Labs: BNP (last 3 results) No results for input(s): BNP in the last 8760 hours. Basic Metabolic Panel: Recent Labs  Lab 07/07/21 2237 07/08/21 1046 07/09/21 0201 07/10/21 1020 07/12/21 1008  NA 140  --  137 136 136  K 3.6  --  3.5 3.7 4.1  CL 104  --  101 99 104  CO2 29  --  28 29 27   GLUCOSE 148*  --  103* 147* 163*  BUN 16  --  15 13 13   CREATININE 1.15 1.01 1.12 1.21 0.99  CALCIUM 9.6  --  9.3 9.7 9.3   Liver Function Tests:  Recent Labs  Lab 07/07/21 2237 07/10/21 1020 07/12/21 1008  AST 17 15 35  ALT 7 6 8   ALKPHOS 67 62 61  BILITOT 0.9 1.0 0.8  PROT 6.9 6.3* 6.1*  ALBUMIN 4.1 3.6 3.4*   Recent Labs  Lab 07/10/21 1020  LIPASE 31   No results for input(s): AMMONIA in the last 168 hours. CBC: Recent Labs  Lab 07/07/21 2237 07/08/21 0921 07/09/21 0201 07/10/21 1020 07/12/21 1008  WBC 5.7 5.3 6.0 5.4 5.7  NEUTROABS 3.2  --   --   --   --   HGB 12.9* 12.0* 11.8* 12.9* 12.7*  HCT 41.2 38.6* 37.6* 41.5 40.5  MCV 82.2 82.0 81.6 81.4 83.0  PLT 218 208 200 210 194   Cardiac Enzymes: No results for input(s): CKTOTAL, CKMB, CKMBINDEX, TROPONINI in the last 168 hours. BNP: Invalid input(s): POCBNP CBG: Recent Labs  Lab 07/11/21 1148 07/11/21 1730 07/11/21 2140 07/12/21 0817 07/12/21 1211  GLUCAP 168* 111* 120* 95 171*   D-Dimer No results for input(s): DDIMER in the last 72 hours. Hgb A1c Recent Labs    07/10/21 1020  HGBA1C 6.8*   Lipid Profile No results for input(s): CHOL, HDL, LDLCALC, TRIG, CHOLHDL, LDLDIRECT in the last 72  hours. Thyroid function studies No results for input(s): TSH, T4TOTAL, T3FREE, THYROIDAB in the last 72 hours.  Invalid input(s): FREET3 Anemia work up No results for input(s): VITAMINB12, FOLATE, FERRITIN, TIBC, IRON, RETICCTPCT in the last 72 hours. Urinalysis    Component Value Date/Time   COLORURINE YELLOW 07/07/2021 2204   APPEARANCEUR CLOUDY (A) 07/07/2021 2204   APPEARANCEUR Cloudy (A) 07/04/2021 1127   LABSPEC 1.019 07/07/2021 2204   PHURINE 5.0 07/07/2021 2204   GLUCOSEU NEGATIVE 07/07/2021 2204   HGBUR NEGATIVE 07/07/2021 2204   BILIRUBINUR NEGATIVE 07/07/2021 2204   BILIRUBINUR Negative 07/04/2021 1127   KETONESUR 5 (A) 07/07/2021 2204   PROTEINUR NEGATIVE 07/07/2021 2204   UROBILINOGEN negative (A) 03/24/2020 1519   NITRITE NEGATIVE 07/07/2021 2204   LEUKOCYTESUR LARGE (A) 07/07/2021 2204   Sepsis Labs Invalid input(s): PROCALCITONIN,  WBC,  LACTICIDVEN Microbiology Recent Results (from the past 240 hour(s))  Microscopic Examination     Status: Abnormal   Collection Time: 07/04/21 11:27 AM   Urine  Result Value Ref Range Status   WBC, UA >30 (A) 0 - 5 /hpf Final   RBC 3-10 (A) 0 - 2 /hpf Final   Epithelial Cells (non renal) 0-10 0 - 10 /hpf Final   Renal Epithel, UA None seen None seen /hpf Final   Mucus, UA Present Not Estab. Final   Bacteria, UA Many (A) None seen/Few Final  Urine Culture     Status: Abnormal   Collection Time: 07/04/21 11:35 AM   Specimen: Urine   UR  Result Value Ref Range Status   Urine Culture, Routine Final report (A)  Final   Organism ID, Bacteria Comment (A)  Final    Comment: Escherichia coli, identified by an automated biochemical system. 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 Greater than 100,000 colony forming units per mL    ORGANISM ID, BACTERIA Not applicable  Final   Antimicrobial Susceptibility Comment  Final    Comment:       ** S = Susceptible; I = Intermediate; R =  Resistant **                    P = Positive; N = Negative  MICS are expressed in micrograms per mL    Antibiotic                 RSLT#1    RSLT#2    RSLT#3    RSLT#4 Amoxicillin/Clavulanic Acid    I Ampicillin                     R Cefazolin                      R Cefepime                       R Ceftriaxone                    R Cefuroxime                     R Ciprofloxacin                  R Ertapenem                      S Gentamicin                     S Imipenem                       S Levofloxacin                   R Meropenem                      S Nitrofurantoin                 I Piperacillin/Tazobactam        S Tetracycline                   R Tobramycin                     R Trimethoprim/Sulfa             R   Urine Culture     Status: Abnormal   Collection Time: 07/08/21  1:29 AM   Specimen: Urine, Clean Catch  Result Value Ref Range Status   Specimen Description   Final    URINE, CLEAN CATCH Performed at [facility], [place] [address], [place], [place] [address]    Special Requests   Final    NONE Performed at [facility], [place] [address], [place], [place] [address]    Culture (A)  Final    >=100,000 COLONIES/mL ESCHERICHIA COLI Confirmed Extended Spectrum Beta-Lactamase Producer (ESBL).  In bloodstream infections from ESBL organisms, carbapenems are preferred over piperacillin/tazobactam. They are shown to have a lower risk of mortality.    Report Status 07/11/2021 FINAL  Final   Organism ID, Bacteria ESCHERICHIA COLI (A)  Final      Susceptibility   Escherichia coli - MIC*    AMPICILLIN >=32 RESISTANT Resistant     CEFAZOLIN >=64 RESISTANT Resistant     CEFEPIME 16 RESISTANT Resistant     CEFTRIAXONE >=64 RESISTANT Resistant     CIPROFLOXACIN >=4 RESISTANT Resistant     GENTAMICIN <=1 SENSITIVE Sensitive     IMIPENEM <=0.25 SENSITIVE Sensitive     NITROFURANTOIN 64 INTERMEDIATE Intermediate      TRIMETH/SULFA >=320 RESISTANT Resistant  AMPICILLIN/SULBACTAM >=32 RESISTANT Resistant     PIP/TAZO <=4 SENSITIVE Sensitive     * >=100,000 COLONIES/mL ESCHERICHIA COLI  SARS CORONAVIRUS 2 (TAT 6-24 HRS) Nasopharyngeal Nasopharyngeal Swab     Status: None   Collection Time: 07/08/21  2:02 AM   Specimen: Nasopharyngeal Swab  Result Value Ref Range Status   SARS Coronavirus 2 NEGATIVE NEGATIVE Final    Comment: (NOTE) SARS-CoV-2 target nucleic acids are NOT DETECTED.  The SARS-CoV-2 RNA is generally detectable in upper and lower respiratory specimens during the acute phase of infection. Negative results do not preclude SARS-CoV-2 infection, do not rule out co-infections with other pathogens, and should not be used as the sole basis for treatment or other patient management decisions. Negative results must be combined with clinical observations, patient history, and epidemiological information. The expected result is Negative.  Fact Sheet for Patients: SugarRoll.be  Fact Sheet for Healthcare Providers: https://www.[employer].com/  This test is not yet approved or cleared by the [place] FDA and  has been authorized for detection and/or diagnosis of SARS-CoV-2 by FDA under an Emergency Use Authorization (EUA). This EUA will remain  in effect (meaning this test can be used) for the duration of the COVID-19 declaration under Se ction 564(b)(1) of the Act, 21 U.S.C. section 360bbb-3(b)(1), unless the authorization is terminated or revoked sooner.  Performed at [facility], [place] [address] [place], [place] [address]   Culture, blood (routine x 2)     Status: None (Preliminary result)   Collection Time: 07/08/21  2:20 PM   Specimen: Left Antecubital; Blood  Result Value Ref Range Status   Specimen Description   Final    LEFT ANTECUBITAL Performed at [facility] [address] [place], [place]  [address]    Special Requests   Final    BOTTLES DRAWN AEROBIC AND ANAEROBIC Blood Culture adequate volume Performed at [facility] [address], [place], [place] [address]    Culture   Final    NO GROWTH 4 DAYS Performed at [facility], [place] [address], [place], [place] [address]    Report Status PENDING  Incomplete  Culture, blood (routine x 2)     Status: None (Preliminary result)   Collection Time: 07/08/21  2:29 PM   Specimen: BLOOD LEFT HAND  Result Value Ref Range Status   Specimen Description   Final    BLOOD LEFT HAND Performed at [facility] [address] [place], [place] [address]    Special Requests   Final    BOTTLES DRAWN AEROBIC ONLY Blood Culture results may not be optimal due to an inadequate volume of blood received in culture bottles Performed at [facility] [address], [place], [place] [address]    Culture   Final    NO GROWTH 4 DAYS Performed at [facility], [place] [address], [place], [place] [address]    Report Status PENDING  Incomplete     Time coordinating discharge:  39 minutes  SIGNED:   Georgette Shell, MD  [facility] 07/12/2021, 3:25 PM

## 2021-07-13 LAB — CULTURE, BLOOD (ROUTINE X 2)
Culture: NO GROWTH
Culture: NO GROWTH
Special Requests: ADEQUATE

## 2021-07-15 ENCOUNTER — Telehealth: Payer: Self-pay

## 2021-07-15 NOTE — Telephone Encounter (Signed)
Open in error

## 2021-07-25 ENCOUNTER — Telehealth: Payer: Self-pay | Admitting: Urology

## 2021-07-25 NOTE — Telephone Encounter (Signed)
Pt's wife called to see if Mr. Gere needs to have a f/u with Dr. Alyson Ingles. I didn't see anything in his chart as far as needing a f/u. Please call when you can.

## 2021-07-26 NOTE — Telephone Encounter (Signed)
Wife reports PCP has culture pending and will call back with results and move forward from there.

## 2021-08-02 ENCOUNTER — Other Ambulatory Visit: Payer: Self-pay

## 2021-08-02 NOTE — Telephone Encounter (Signed)
Left message for wife to return call to office.

## 2021-09-27 DEATH — deceased

## 2021-10-27 ENCOUNTER — Ambulatory Visit (INDEPENDENT_AMBULATORY_CARE_PROVIDER_SITE_OTHER): Payer: Medicare (Managed Care) | Admitting: Neurology
